# Patient Record
Sex: Female | Born: 1935
Health system: Southern US, Community
[De-identification: ages and names within clinical notes are randomized; demographics above are authoritative.]

## PROBLEM LIST (undated history)

## (undated) DIAGNOSIS — R519 Headache, unspecified: Secondary | ICD-10-CM

## (undated) DIAGNOSIS — Z8614 Personal history of Methicillin resistant Staphylococcus aureus infection: Secondary | ICD-10-CM

## (undated) DIAGNOSIS — I5189 Other ill-defined heart diseases: Secondary | ICD-10-CM

## (undated) DIAGNOSIS — R011 Cardiac murmur, unspecified: Secondary | ICD-10-CM

## (undated) DIAGNOSIS — N289 Disorder of kidney and ureter, unspecified: Secondary | ICD-10-CM

## (undated) DIAGNOSIS — R0602 Shortness of breath: Secondary | ICD-10-CM

## (undated) DIAGNOSIS — I10 Essential (primary) hypertension: Secondary | ICD-10-CM

## (undated) DIAGNOSIS — N189 Chronic kidney disease, unspecified: Secondary | ICD-10-CM

## (undated) DIAGNOSIS — I509 Heart failure, unspecified: Secondary | ICD-10-CM

## (undated) DIAGNOSIS — R51 Headache: Secondary | ICD-10-CM

## (undated) DIAGNOSIS — R42 Dizziness and giddiness: Secondary | ICD-10-CM

## (undated) DIAGNOSIS — M1990 Unspecified osteoarthritis, unspecified site: Secondary | ICD-10-CM

## (undated) HISTORY — DX: Essential (primary) hypertension: I10

## (undated) HISTORY — DX: Shortness of breath: R06.02

## (undated) HISTORY — PX: JOINT REPLACEMENT: SHX530

## (undated) HISTORY — DX: Cardiac murmur, unspecified: R01.1

## (undated) HISTORY — DX: Unspecified osteoarthritis, unspecified site: M19.90

## (undated) HISTORY — DX: Personal history of Methicillin resistant Staphylococcus aureus infection: Z86.14

## (undated) HISTORY — PX: FRACTURE SURGERY: SHX138

## (undated) HISTORY — DX: Chronic kidney disease, unspecified: N18.9

## (undated) HISTORY — DX: Headache, unspecified: R51.9

## (undated) HISTORY — DX: Dizziness and giddiness: R42

## (undated) HISTORY — DX: Headache: R51

## (undated) HISTORY — PX: CAROTID ENDARTERECTOMY: SUR193

## (undated) HISTORY — PX: OTHER SURGICAL HISTORY: SHX169

---
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
## 2000-10-10 HISTORY — PX: OTHER SURGICAL HISTORY: SHX169

## 2001-03-27 ENCOUNTER — Encounter: Payer: Self-pay | Admitting: *Deleted

## 2001-03-29 ENCOUNTER — Inpatient Hospital Stay (HOSPITAL_COMMUNITY): Admission: RE | Admit: 2001-03-29 | Discharge: 2001-04-02 | Payer: Self-pay | Admitting: *Deleted

## 2001-04-02 ENCOUNTER — Inpatient Hospital Stay (HOSPITAL_COMMUNITY)
Admission: RE | Admit: 2001-04-02 | Discharge: 2001-04-07 | Payer: Self-pay | Admitting: Physical Medicine & Rehabilitation

## 2001-12-12 ENCOUNTER — Other Ambulatory Visit: Admission: RE | Admit: 2001-12-12 | Discharge: 2001-12-12 | Payer: Self-pay | Admitting: Family Medicine

## 2003-07-14 DIAGNOSIS — M48061 Spinal stenosis, lumbar region without neurogenic claudication: Secondary | ICD-10-CM | POA: Insufficient documentation

## 2003-10-23 ENCOUNTER — Encounter: Admission: RE | Admit: 2003-10-23 | Discharge: 2003-10-23 | Payer: Self-pay | Admitting: Orthopedic Surgery

## 2003-11-06 ENCOUNTER — Encounter: Admission: RE | Admit: 2003-11-06 | Discharge: 2003-11-06 | Payer: Self-pay | Admitting: Orthopedic Surgery

## 2003-12-11 ENCOUNTER — Encounter: Admission: RE | Admit: 2003-12-11 | Discharge: 2003-12-11 | Payer: Self-pay | Admitting: Orthopedic Surgery

## 2005-07-29 ENCOUNTER — Encounter: Admission: RE | Admit: 2005-07-29 | Discharge: 2005-07-29 | Payer: Self-pay | Admitting: Orthopedic Surgery

## 2005-08-11 ENCOUNTER — Encounter: Admission: RE | Admit: 2005-08-11 | Discharge: 2005-08-11 | Payer: Self-pay | Admitting: Orthopedic Surgery

## 2008-09-17 ENCOUNTER — Encounter: Admission: RE | Admit: 2008-09-17 | Discharge: 2008-09-17 | Payer: Self-pay | Admitting: Orthopedic Surgery

## 2009-01-29 DIAGNOSIS — E049 Nontoxic goiter, unspecified: Secondary | ICD-10-CM | POA: Insufficient documentation

## 2009-02-05 DIAGNOSIS — B079 Viral wart, unspecified: Secondary | ICD-10-CM | POA: Insufficient documentation

## 2009-03-10 ENCOUNTER — Encounter: Payer: Self-pay | Admitting: Cardiovascular Disease

## 2009-03-10 ENCOUNTER — Ambulatory Visit: Payer: Self-pay | Admitting: Cardiovascular Disease

## 2009-03-10 ENCOUNTER — Ambulatory Visit: Payer: Self-pay

## 2009-03-10 DIAGNOSIS — R079 Chest pain, unspecified: Secondary | ICD-10-CM

## 2009-03-10 DIAGNOSIS — R011 Cardiac murmur, unspecified: Secondary | ICD-10-CM | POA: Insufficient documentation

## 2009-03-17 ENCOUNTER — Telehealth (INDEPENDENT_AMBULATORY_CARE_PROVIDER_SITE_OTHER): Payer: Self-pay | Admitting: *Deleted

## 2009-03-17 ENCOUNTER — Inpatient Hospital Stay (HOSPITAL_COMMUNITY): Admission: RE | Admit: 2009-03-17 | Discharge: 2009-03-23 | Payer: Self-pay | Admitting: Orthopedic Surgery

## 2009-06-15 ENCOUNTER — Emergency Department: Payer: Self-pay | Admitting: Unknown Physician Specialty

## 2009-06-19 ENCOUNTER — Emergency Department: Payer: Self-pay | Admitting: Emergency Medicine

## 2009-06-21 ENCOUNTER — Emergency Department: Payer: Self-pay | Admitting: Emergency Medicine

## 2011-01-17 LAB — BASIC METABOLIC PANEL
BUN: 14 mg/dL (ref 6–23)
CO2: 26 mEq/L (ref 19–32)
Calcium: 8.1 mg/dL — ABNORMAL LOW (ref 8.4–10.5)
Chloride: 102 mEq/L (ref 96–112)
Chloride: 104 mEq/L (ref 96–112)
Creatinine, Ser: 1.15 mg/dL (ref 0.4–1.2)
Creatinine, Ser: 1.18 mg/dL (ref 0.4–1.2)
GFR calc non Af Amer: 47 mL/min — ABNORMAL LOW (ref >60)
Glucose, Bld: 105 mg/dL — ABNORMAL HIGH (ref 70–99)
Glucose, Bld: 111 mg/dL — ABNORMAL HIGH (ref 70–99)
Potassium: 4.2 mEq/L (ref 3.5–5.1)

## 2011-01-17 LAB — CBC
HCT: 30.9 % — ABNORMAL LOW (ref 36.0–46.0)
HCT: 39.3 % (ref 36.0–46.0)
Hemoglobin: 13.5 g/dL (ref 12.0–15.0)
MCHC: 34.4 g/dL (ref 30.0–36.0)
MCHC: 34.4 g/dL (ref 30.0–36.0)
MCHC: 34.9 g/dL (ref 30.0–36.0)
MCV: 95.3 fL (ref 78.0–100.0)
MCV: 95.9 fL (ref 78.0–100.0)
MCV: 96.1 fL (ref 78.0–100.0)
Platelets: 162 10*3/uL (ref 150–400)
Platelets: 207 10*3/uL (ref 150–400)
RBC: 2.84 MIL/uL — ABNORMAL LOW (ref 3.87–5.11)
RDW: 12.9 % (ref 11.5–15.5)
RDW: 13.3 % (ref 11.5–15.5)
RDW: 13.3 % (ref 11.5–15.5)
WBC: 12 10*3/uL — ABNORMAL HIGH (ref 4.0–10.5)

## 2011-01-17 LAB — URINALYSIS, ROUTINE W REFLEX MICROSCOPIC
Ketones, ur: NEGATIVE mg/dL
Nitrite: NEGATIVE
Protein, ur: NEGATIVE mg/dL
Urobilinogen, UA: 0.2 mg/dL (ref 0.0–1.0)
pH: 7 (ref 5.0–8.0)

## 2011-01-17 LAB — PROTIME-INR
INR: 0.9 (ref 0.00–1.49)
INR: 1 (ref 0.00–1.49)
INR: 1.4 (ref 0.00–1.49)
INR: 1.9 — ABNORMAL HIGH (ref 0.00–1.49)
Prothrombin Time: 12.4 seconds (ref 11.6–15.2)
Prothrombin Time: 13.2 seconds (ref 11.6–15.2)
Prothrombin Time: 15.1 seconds (ref 11.6–15.2)
Prothrombin Time: 17.2 seconds — ABNORMAL HIGH (ref 11.6–15.2)

## 2011-01-17 LAB — TYPE AND SCREEN
ABO/RH(D): A POS
Antibody Screen: NEGATIVE

## 2011-01-17 LAB — DIFFERENTIAL
Basophils Relative: 1 % (ref 0–1)
Monocytes Relative: 6 % (ref 3–12)
Neutro Abs: 5.4 10*3/uL (ref 1.7–7.7)
Neutrophils Relative %: 69 % (ref 43–77)

## 2011-01-17 LAB — COMPREHENSIVE METABOLIC PANEL
Alkaline Phosphatase: 61 U/L (ref 39–117)
BUN: 31 mg/dL — ABNORMAL HIGH (ref 6–23)
CO2: 27 mEq/L (ref 19–32)
Calcium: 9.7 mg/dL (ref 8.4–10.5)
GFR calc non Af Amer: 41 mL/min — ABNORMAL LOW (ref >60)
Glucose, Bld: 95 mg/dL (ref 70–99)
Potassium: 4.5 mEq/L (ref 3.5–5.1)
Total Protein: 7.7 g/dL (ref 6.0–8.3)

## 2011-01-17 LAB — URINE CULTURE: Colony Count: 30000

## 2011-01-17 LAB — URINE MICROSCOPIC-ADD ON
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
## 2011-02-22 NOTE — Op Note (Signed)
NAMEHOLLY, IANNACCONE NO.:  [id number]   MEDICAL RECORD NO.:  [id number]          PATIENT TYPE:  INP   LOCATION:  5038                         FACILITY:  [facility]   PHYSICIAN:  Anderson Malta, M.D.    DATE OF BIRTH:  September 15, 1936   DATE OF PROCEDURE:  03/17/2009  DATE OF DISCHARGE:                               OPERATIVE REPORT   PREOPERATIVE DIAGNOSIS:  Right knee arthritis.   POSTOPERATIVE DIAGNOSIS:  Right knee arthritis.   PROCEDURE:  Right total knee replacement.   SURGEON:  Anderson Malta, MD   ASSISTANT:  Epimenio Foot, PA   ANESTHESIA:  General endotracheal.   ESTIMATED BLOOD LOSS:  100 mL.   DRAINS:  Hemovac x1.   INDICATIONS:  Carolyn Hendricks is a 8-year female with right knee  arthritis who presents now for operative management of her arthritis  following failure of conservative management after explanation of risks  and benefits of total knee replacement.   COMPONENTS INSERTED:  DePuy rotating platform 4 narrow femur, 3 tibia,  15 poly, 35 patella.   TOURNIQUET TIME:  115 minutes at 300 mmHg.   PROCEDURE IN DETAIL:  The patient was brought to the operating room  where general endotracheal anesthesia was induced.  Preoperative  antibiotics were administered.  A time-out was called.  Right leg was  then prepped with DuraPrep solution including the foot and draped in a  sterile manner.  This was done after pre-scrubbing with chlorhexidine,  alcohol, and Betadine which allowed to air dry.  The leg was elevated  and exsanguinated with an Esmarch wrap.  Tourniquet was inflated.  An  anterior approach to the knee was utilized.  Skin and subcutaneous  tissues were sharply divided.  Median parapatellar arthrotomy was made.  Precise location was marked #1 Vicryl suture.  Patella was everted.  ACL  and PCL were released.  Lateral patellofemoral ligament was released.  The tissue from the anterior distal femur was removed without disturbing  the  periosteum on the femur.  The fat pad was partially excised.  Significant wear was seen in the medial compartment consistent with  preoperative radiographs.  Two bicortical pins were placed in the medial  and distal femur, 2 pins were placed in the proximal and medial tibia.  Registration points were achieved with the hip center rotation  bimalleolar axis and various points around the knee.  Tibial cut was  then made with collateral and posterior structures protected  perpendicular to mechanical axis in accordance with preoperative  templating and computer guidance.  Next, a tensioner device was placed  in both extension and flexion.  At this time, it appeared that a 12.5 or  15 spacer would give a very good stability in full extension.  Following  this, rotational cuts were made with the rotational guide.  Chamfer cuts  and posterior and anterior cuts were then performed.  A box cut was then  performed.  Tibia was then keel punched.  Trial components were placed.  Patella was cut freehand and a trial button was placed with 25-mm  patella going  to 24 mm of resected patella plus button.  The patient had  a good stability to varus-valgus stress at 0, 30, and 90 degrees,  excellent patella tracking with no-thumbs technique.  Trial components  were removed.  Cut bony surfaces were thoroughly irrigated.  Components  were inserted.  Excess cement was removed.  Same stability and  parameters were maintained.  Tourniquet was released.  Bleeding points  encountered were controlled with electrocautery.  Hemovac drain was  placed.  Parapatellar arthrotomy was closed over a bolster using #1  Vicryl suture followed by interrupted inverted 2-0 Vicryl suture and  skin staples.  The small incisions for the pins were irrigated and  closed using 3-0 nylon.  A bulky dressing was applied.  Solution of  Marcaine, morphine, and clonidine injected to the knee for postop pain  relief.  She tolerated the procedure  well without any immediate  complications.  Freda Munro Vernon's assistance was a medical necessity for  retraction of important neurovascular structures.  She was required all  times during the case.      Anderson Malta, M.D.  Electronically Signed     GSD/MEDQ  D:  03/17/2009  T:  03/18/2009  Job:  [id number]

## 2011-02-25 NOTE — Discharge Summary (Signed)
[place]  Patient:    Carolyn Hendricks, Carolyn Hendricks                      MRN: [id number] Adm. Date:  [id number] Disc. Date: [id number] Attending:  Alger Simons T Dictator:   Lavon Paganini. [place], P.A.                           Discharge Summary  ADDENDUM   The patient was to be discharged on April 06, 2001, but discharge was held until June 29, for monitoring of patient of which went very well.  There was no change in her hospital course other than discharge date.  She had received ongoing therapies as advised and Arville Go will continue to follow her Coumadin as advised with next prothrombin time for April 09, 2001. DD:  04/09/01 TD:  04/09/01 Job: [id number]

## 2011-02-25 NOTE — H&P (Signed)
[place]  Patient:    Carolyn Hendricks, Carolyn Hendricks                        MRN: [id number] Adm. Date:  03/29/01 Attending:  Hedda Slade, M.D.                         History and Physical  HISTORY OF PRESENT ILLNESS:  Ms. Gunderson is a 75 year old woman with painful osteoarthritis of the knees admitted for left total knee arthroplasty.  The knees are painful with all activities, at times have caused her to fall or give way.  She feels like she is experiencing bone-on-bone pain.  This interferes also with her sleep.  Surgery and the possible complications were fully discussed with her.  No guarantees were offered, and she elected to proceed on with total knee arthroplasty.  ALLERGIES:  No medical allergies known.  MEDICATIONS:  Norvasc 5 mg one q.d., and Atacand/hydrochlorothiazide 16/12.5 mg one q.d.  MEDICAL DOCTOR:  Loura Pardon, M.D.  PAST SURGICAL HISTORY:  She has had facial surgery.  REVIEW OF SYSTEMS:  She is not current with Pap smear or mammogram.  She smokes one pack of cigarettes per day.  FAMILY HISTORY:  Positive with ASCVD and high blood pressure.  PHYSICAL EXAMINATION:  VITAL SIGNS:  She is 5 feet 5 inches, 178 pounds.  Blood pressure 146/84, pulse is 88, temperature 98.3, respirations 16.  HEENT:  She wears glasses.  Extraocular movements full.  Her tympanic membranes have a normal light reflex.  Pharynx is clear.  There is an upper denture.  CHEST:  She has fair volume, a little rhonchi which cleared with cough.  CARDIAC:  Heart has a regular rhythm, no murmurs heard.  ABDOMEN:  Soft, nontender, no palpable masses.  Bowel sounds are normal.  ORTHOPEDIC:  She has an antalgic gait and varus knees.  She has good femoral pulse without a bruit.  Dorsalis pedis pulse is good.  There is no leg edema. The left knee motion with goniometer is 25-120 degrees.  There is palpable crepitation, slight effusion, no palpable osteophytes, with a  small amount of varus-valgus wiggling to test.  DIAGNOSTIC STUDIES:  X-rays:  There is no medial joint space.  There is tricompartment osteoarthritis.  ADMITTING DIAGNOSES: 1. Tricompartment osteoarthritis, left knee, with varus deformity. 2. High blood pressure. 3. Chronic lung disease with smoking.  PLAN:  Left total knee arthroplasty as fully discussed. DD:  03/27/01 TD:  03/27/01 Job: [id number]

## 2011-02-25 NOTE — Op Note (Signed)
[place]. [facility]  Patient:    Carolyn Hendricks, Carolyn Hendricks                      MRN: [id number] Proc. Date: 03/29/01 Adm. Date:  [id number] Attending:  Marlon Pel                           Operative Report  PREOPERATIVE DIAGNOSIS:  Tricompartment osteoarthritis, left knee.  POSTOPERATIVE DIAGNOSIS:  Tricompartment osteoarthritis, left knee.  OPERATIVE PROCEDURE:  Left knee total knee arthroplasty utilizing [employer] PS [place]orp[place] components.  ANESTHESIA:  General by endotracheal tube.  SURGEON:  Hedda Slade, M.D.  ASSISTANT:  Melina Copa. Maryjean Ka, M.D.  DESCRIPTION OF PROCEDURE:  Patient had an IV started and was given Ancef 2 g IV.  She was given a general anesthetic by endotracheal tube.  A Foley catheter was put in place.  A bump was placed under the left hip.  A tourniquet was applied, proximal left thigh.  It was then isolated with a U-drape.  She was then prepped from the toes to along the edges of this drape, then draped in the usual manner for sterility; this included the use of two Iobans.  The extremity was exsanguinated by use of an Esmarch, the proximal pneumatic tourniquet elevated to 350 mmHg.  A straight oblique incision was made from above the patella, passing distally about mid-patella, then exiting along the medial side of the patellar tendon.  Subcutaneous tissues were dissected back. The knee was opened through the medial retinaculum.  The quadriceps tendon was opened along its edge and the incision then extended distally along the medial side of the patellar tendon; this then allowed the patella to be everted.  There was a large amount of purplish synovitis and this was largely resected.  The cruciates were resected.  The medial and lateral sides of the tibial plateau were dissected and exposed.  The distal femur was resected in 5 degrees of valgus, 10 mm, using the intramedullary guide.  The size of the distal femur was determined  using the sizer, and that was #7. We used a #7 guide and 3 degrees of external rotation to guide and orient the various chamfer cuts at the end of the femur.  We then used the standard guides to form the distal femur PS box.  On the tibial side, the tibial plateau was resected 12 mm from the highest or lateral side with 5 degrees of posterior tilt using intramedullary guide.  At this point, we used trials, #7 femoral component, #7 tibial tray, and 10-mm thick #7 tibial bearing insert; this allowed full flexion and extension of the knee after a little added release medially.  The popliteus remained intact and did not seem tight.  Attention was then directed back to the tibial plateau and the tower was used with guide and predetermined rotation to impact for the fin of the tibial tray.  Then we measured the patella and resected about 10 mm and sized for #7 universal dome patella.  The peg holes were made and the trial dome patella fit well and with flexion and extension, it road on the femoral trochlea very well with no sensation of lateral subluxation or tilting.  At this point then, the knee would go into full extension, would fully flex and had just a wiggle of laxity in extension and in flexion.  We then copiously irrigated the entire area with  pulsatile lavage and then cemented the tibial tray, femoral component and tibial button with Simplex.  We lavaged the bone surfaces, dried them, applied cement and applied a layer of cement to the implants, then each were installed, compressed and held there until the cement hardened; in the meantime, all excess cement was removed.  At this point, we let the tourniquet down, again copiously irrigated and made a real effort to find and remove any bits of debris or cement, we irrigated again and then closed.  The retinaculum was approximated with figure-of-eight sutures of #1 Vicryl, more superficial tissues approximated with 2-0  Vicryl, skin edges approximated with metal staples.  One suction Hemovac was placed superficial to the retinaculum and brought out through a superolateral puncture wounds.  Subcutaneous Marcaine was instilled, Xeroform applied on the skin followed by 4 x 4s, an ABD, Kerlix and an Ace wrap.  A knee immobilizer was applied and the patient then returned to the recovery room in good condition.  Estimated blood loss was approximately 300, none was replaced.  There were no apparent cardiovascular problems throughout the case.  DD:  03/29/01 TD:  03/29/01 Job: [id number]

## 2011-02-25 NOTE — Discharge Summary (Signed)
[place]. [facility]  Patient:    STELLAROSE, CERNY                      MRN: [id number] Adm. Date:  [id number] Disc. Date: 04/06/01 Attending:  Alger Simons T Dictator:   Lavon Paganini. [place], P.A. CC:         Pixie Casino, M.D.  [name], M.D., [place], [place].   Discharge Summary  DISCHARGE DIAGNOSES: 1. Left total knee arthroplasty March 29, 2001. 2. Postoperative anemia. 3. Hypertension. 4. Tobacco abuse.  HISTORY OF PRESENT ILLNESS:  A 75 year old white female admitted March 29, 2001, with progressive left knee pain and no relief with conservative care. X-rays with advanced degenerative joint disease.  Underwent a left total knee arthroplasty March 29, 2001, per Dr. Jenean Lindau.  Placed on Coumadin for deep vein thrombosis prophylaxis and partial weightbearing.  Postoperative pain management.  Hospital course uneventful.  Minimal assistance for ambulation and transfers.  No chest pain or shortness of breath.  Latest hemoglobin of 9.0, INR 1.1, chemistries unremarkable.  Admitted for comprehensive rehabilitation program.  PAST MEDICAL HISTORY:  Hypertension.  PAST SURGICAL HISTORY:  Facial plastic surgery, ankle fracture in 1984, and appendectomy.  ALLERGIES:  No known drug allergies.  TOBACCO:  Positive smoker.  PRIMARY PHYSICIAN:  Dr. [name] of [place], [place].  MEDICATIONS PRIOR TO ADMISSION: 1. Norvasc 5 mg daily. 2. Atacand / hydrochlorothiazide 16-12.5 mg daily.  SOCIAL HISTORY:  Lives alone in [place], [place].  Independent prior to admission and retired.  One level home with step to entry.  Daughter to provide assistance on discharge.  HOSPITAL COURSE:  The patient did well while on rehabilitation services with therapies initiated on a b.i.d. basis.  The following issues were followed in patients rehabilitation course.  Pertaining to Ms. Clapps left total knee arthroplasty, remains stable. Surgical  site healing nicely.  Staples remained intact.  Followup with Dr. Jenean Lindau on Monday, April 09, 2001, for removal of clips.  She was ambulating extended distances with a walker, partial weightbearing.  She was needing supervision for activities of daily living.  Arrangements were made for home health physical occupational therapies as well as a nurse.  She continued on Coumadin for deep vein thrombosis prophylaxis.  She was on subcutaneous lovenox until INR was greater 2.0.  This was discontinued on April 05, 2001, as INR lab results of 2.0 were noted.  Postoperative anemia continued to improve with iron supplement from 8.4 to 9.1.  Her trinsicon was changed to Chromagen for better GI tolerability.  Her blood pressures remained controlled with home regimen of Norvasc, atacand and hydrochlorothiazide.  There was no orthostatic changes.  Noted a history of tobacco abuse.  It was discussed, the need for cessation of smoking.   It was questionable if she will be compliant with these requests.  She was voiding without difficulty but noted some nighttime frequency.  A urinalysis study was negative.  There was some suggestion of the possibility of trials of Ditropan.  This will be followed up with her primary M.D.  LATEST LABORATORY DATA:  Showed a hemoglobin of 9.1, hematocrit 26.4, INR 2.0.  Sodium 141, potassium 3.6, BUN 17, creatinine 0.8.  DISCHARGE MEDICATIONS: 1. Coumadin daily with dose to be established at time of discharge to complete    Coumadin protocol.  Her Coumadin dosage would be added to her discharge    sheet provided for patient. 2. Norvasc 5 mg daily. 3.  Atacand - hydrochlorothiazide daily as prior to hospital admission. 4. Chromagen iron supplement 1 tablet twice daily. 5. Percocet as needed for pain. 6. Tylenol as needed.  ACTIVITY:  Partial weightbearing with walker.  DIET:  Regular.  WOUND CARE:  Follow up with Dr. Jenean Lindau on Monday, April 09, 2001, for removal of staples.   Call if any increased redness, drainage or fever.  Home health nurse for prothrombin time to be followed by home health agency versus her primary M.D. depending local support of home health agency.  Dr. [name] of [place] will notified as needed.  Home health physical and occupational therapy also arranged. DD:  04/05/01 TD:  04/05/01 Job: [id number]

## 2011-02-25 NOTE — Discharge Summary (Signed)
NAMEDELAINA, FETSCH NO.:  [id number]   MEDICAL RECORD NO.:  [id number]          PATIENT TYPE:  INP   LOCATION:  5038                         FACILITY:  [facility]   PHYSICIAN:  Anderson Malta, M.D.    DATE OF BIRTH:  12-Mar-1936   DATE OF ADMISSION:  03/17/2009  DATE OF DISCHARGE:  03/23/2009                               DISCHARGE SUMMARY   ADMISSION DIAGNOSES:  1. Right knee osteoarthritis.  2. Hypertension.  3. Sleep apnea.  4. Osteoporosis.  5. Status post left total knee replacement in 2002.  6. Narcotic-dependent fibromyalgia.  7. Tobacco abuse.   DISCHARGE DIAGNOSES:  1. Right knee osteoarthritis.  2. Hypertension.  3. Sleep apnea.  4. Osteoporosis.  5. Status post left total knee replacement in 2002.  6. Narcotic-dependent fibromyalgia.  7. Tobacco abuse.  8. Posthemorrhagic anemia.   PROCEDURE:  On March 17, 2009, the patient underwent right total knee  replacement performed by Dr. Marlou Sa, assisted by Phillips Hay, PA-C,  under general anesthesia.   CONSULTATIONS:  None.   BRIEF HISTORY:  Ms. Carolyn Hendricks is a 75 year old female with right knee  arthritis, which has failed conservative management.  She wished to  proceed with surgical intervention in the form of a right total knee  arthroplasty and was admitted for this procedure.   BRIEF HOSPITAL COURSE:  The patient tolerated the procedure under  general anesthesia without complications.  Postoperatively,  neurovascular and motor function of the lower extremities was noted to  be intact.  She was started on physical therapy for ambulation and gait  training, range of motion, stretching, and strengthening exercises.  The  patient was also seen by the occupational therapist for ADLs.  She  improved throughout the hospital stay to an independent level with ADLs.  She was ambulating 250 feet prior to discharge.  CPM was utilized for  passive range of motion and she was also taught active range of motion.  Active range of motion was 275 degrees prior to discharge.  Dressing  changes were done daily and the patient's wound was healing without  signs of infection.  She was on Coumadin for DVT prophylaxis with  adjustments in Coumadin dose made according to daily Protimes by the  pharmacist at [facility].  The patient was able to void without difficulty  following removal of Foley catheter.   She was taking a regular diet.   Hemoglobin and hematocrit on admission 32.5 and 39.3 respectively.  Discharge hemoglobin 9.4 and hematocrit 27.1.  INR at discharge 1.9.  Chemistry studies within normal limits on admission with exception of  BUN 31 and creatinine 129, which normalized after surgery.  Otherwise,  values within normal limits on chemistries.  Calcium lowest at 8.1.  Urinalysis negative for urinary tract infection on March 17, 2009, and  urine culture with final results on March 18, 2009, showing 30,000  colonies of multiple bacterial morphotypes.   PLAN:  The patient was discharged to her home.  Arrangements were made  for home health physical therapy.  She will continue with active range  of motion exercises.  Also continue with strengthening and stretching  exercises.  Ambulation weightbearing as tolerated utilizing a walker.  Dressing change to be done daily or as needed.  She will follow up with  Dr. Marlou Sa in 2 weeks from the date of surgery.   MEDICATIONS AT DISCHARGE:  1. Percocet 10/325 one every 3-4 hours as needed for pain.  2. Robaxin 500 mg 1 every 8 hours as needed for spasm.  3. Coumadin 5 mg daily for 4 weeks.   She will resume home medications as taken prior to admission with the  exception of Darvocet-N 100 and Advil.  She was given medication  reconciliation form with these instructions.   She was advised to call the office should she have questions or concerns  prior to return office visit.  All questions encouraged and answered.      Epimenio Foot, P.A.      Anderson Malta, M.D.  Electronically Signed    SMV/MEDQ  D:  04/23/2009  T:  04/23/2009  Job:  [id number]

## 2011-02-25 NOTE — Discharge Summary (Signed)
. [facility]  Patient:    Carolyn Hendricks, Carolyn Hendricks                      MRN: [id number] Adm. Date:  [id number] Disc. Date: [id number] Attending:  Alger Simons T                           Discharge Summary  ADMISSION DIAGNOSES: 1. Tricompartment osteoarthritis, left knee with varus deformity. 2. Hypertension. 3. Chronic lung disease.  DISCHARGE DIAGNOSES: 1. Tricompartment osteoarthritis, left knee with varus deformity. 2. Hypertension. 3. Chronic lung disease.  PROCEDURE:  March 29, 2001, left total knee arthroplasty with cementing of components.  HISTORY OF PRESENT ILLNESS:  See that dictated on admission.  HOSPITAL COURSE:  On the day of admission she was taken to the operating room where the left total knee arthroplasty was performed as detailed in the operative notes.  On the preoperative and postoperative the patient was prophylactic IV antibiotics.  Immediately postoperative she was begun on Coumadin and foot pumps and bed activity.  On the first day postoperative her dressing was changed, her wound appeared fine.  Suction drain was removed. She was begun on physical therapy, partial weightbearing.  By June 22, her hemoglobin was 9.0.  By June 23, it was 8.1 and it was elected at that time to continue with iron treatment p.o.  On June 24, it was felt that she would be a good inpatient rehabilitation candidate and therefore she was discharged to rehabilitation.  It was felt that some of her blood loss was from a subcutaneous skin bleeder and this was started with her first dressing change and was brought under control with pressure dressings.  LABORATORY DATA:  Electrolytes normal, BUN 31, and routine chemistries and liver enzymes normal.  Urinalysis normal.  Hemoglobin on admission was 15 and on June 21, it was 10.3.  This was felt partially due to hemodilution.  EKG was normal sinus rhythm interpreted as a normal EKG. DD:  04/20/01 TD:   04/20/01 Job: [id number]

## 2011-03-08 ENCOUNTER — Other Ambulatory Visit: Payer: Self-pay | Admitting: Neurosurgery

## 2011-03-08 DIAGNOSIS — M479 Spondylosis, unspecified: Secondary | ICD-10-CM

## 2011-03-08 DIAGNOSIS — IMO0002 Reserved for concepts with insufficient information to code with codable children: Secondary | ICD-10-CM

## 2011-03-08 DIAGNOSIS — M419 Scoliosis, unspecified: Secondary | ICD-10-CM

## 2011-03-25 ENCOUNTER — Encounter: Payer: Self-pay | Admitting: Cardiovascular Disease

## 2011-03-25 ENCOUNTER — Other Ambulatory Visit: Payer: Self-pay

## 2012-03-03 ENCOUNTER — Inpatient Hospital Stay: Payer: Self-pay | Admitting: Internal Medicine

## 2012-03-03 DIAGNOSIS — R0602 Shortness of breath: Secondary | ICD-10-CM | POA: Diagnosis not present

## 2012-03-03 DIAGNOSIS — R05 Cough: Secondary | ICD-10-CM | POA: Diagnosis not present

## 2012-03-03 DIAGNOSIS — I6529 Occlusion and stenosis of unspecified carotid artery: Secondary | ICD-10-CM | POA: Diagnosis not present

## 2012-03-03 DIAGNOSIS — A419 Sepsis, unspecified organism: Secondary | ICD-10-CM | POA: Diagnosis not present

## 2012-03-03 DIAGNOSIS — F17200 Nicotine dependence, unspecified, uncomplicated: Secondary | ICD-10-CM | POA: Diagnosis present

## 2012-03-03 DIAGNOSIS — Z8614 Personal history of Methicillin resistant Staphylococcus aureus infection: Secondary | ICD-10-CM | POA: Diagnosis not present

## 2012-03-03 DIAGNOSIS — Z96659 Presence of unspecified artificial knee joint: Secondary | ICD-10-CM | POA: Diagnosis not present

## 2012-03-03 DIAGNOSIS — R0989 Other specified symptoms and signs involving the circulatory and respiratory systems: Secondary | ICD-10-CM | POA: Diagnosis not present

## 2012-03-03 DIAGNOSIS — L0231 Cutaneous abscess of buttock: Secondary | ICD-10-CM | POA: Diagnosis not present

## 2012-03-03 DIAGNOSIS — E860 Dehydration: Secondary | ICD-10-CM | POA: Diagnosis not present

## 2012-03-03 DIAGNOSIS — Z7982 Long term (current) use of aspirin: Secondary | ICD-10-CM | POA: Diagnosis not present

## 2012-03-03 DIAGNOSIS — D72829 Elevated white blood cell count, unspecified: Secondary | ICD-10-CM | POA: Diagnosis not present

## 2012-03-03 DIAGNOSIS — R000 Tachycardia, unspecified: Secondary | ICD-10-CM | POA: Diagnosis not present

## 2012-03-03 DIAGNOSIS — K612 Anorectal abscess: Secondary | ICD-10-CM | POA: Diagnosis not present

## 2012-03-03 DIAGNOSIS — R6510 Systemic inflammatory response syndrome (SIRS) of non-infectious origin without acute organ dysfunction: Secondary | ICD-10-CM | POA: Diagnosis not present

## 2012-03-03 DIAGNOSIS — I471 Supraventricular tachycardia: Secondary | ICD-10-CM | POA: Diagnosis present

## 2012-03-03 DIAGNOSIS — I10 Essential (primary) hypertension: Secondary | ICD-10-CM | POA: Diagnosis present

## 2012-03-03 DIAGNOSIS — L03317 Cellulitis of buttock: Secondary | ICD-10-CM | POA: Diagnosis not present

## 2012-03-03 DIAGNOSIS — R509 Fever, unspecified: Secondary | ICD-10-CM | POA: Diagnosis not present

## 2012-03-03 DIAGNOSIS — M1990 Unspecified osteoarthritis, unspecified site: Secondary | ICD-10-CM | POA: Diagnosis present

## 2012-03-03 DIAGNOSIS — I658 Occlusion and stenosis of other precerebral arteries: Secondary | ICD-10-CM | POA: Diagnosis not present

## 2012-03-03 DIAGNOSIS — R0789 Other chest pain: Secondary | ICD-10-CM | POA: Diagnosis not present

## 2012-03-03 DIAGNOSIS — N179 Acute kidney failure, unspecified: Secondary | ICD-10-CM | POA: Diagnosis not present

## 2012-03-03 LAB — CBC
HGB: 13.5 g/dL (ref 12.0–16.0)
MCH: 31.3 pg (ref 26.0–34.0)
MCHC: 33 g/dL (ref 32.0–36.0)
RBC: 4.31 10*6/uL (ref 3.80–5.20)
RDW: 14 % (ref 11.5–14.5)

## 2012-03-03 LAB — COMPREHENSIVE METABOLIC PANEL
Albumin: 3.5 g/dL (ref 3.4–5.0)
Alkaline Phosphatase: 86 U/L (ref 50–136)
Anion Gap: 11 (ref 7–16)
Bilirubin,Total: 0.6 mg/dL (ref 0.2–1.0)
Calcium, Total: 9 mg/dL (ref 8.5–10.1)
Chloride: 104 mmol/L (ref 98–107)
Creatinine: 1.39 mg/dL — ABNORMAL HIGH (ref 0.60–1.30)
EGFR (African American): 43 — ABNORMAL LOW
EGFR (Non-African Amer.): 37 — ABNORMAL LOW
Osmolality: 278 (ref 275–301)
SGOT(AST): 33 U/L (ref 15–37)
SGPT (ALT): 18 U/L
Sodium: 137 mmol/L (ref 136–145)

## 2012-03-03 LAB — URINALYSIS, COMPLETE
Bacteria: NONE SEEN
Ketone: NEGATIVE
Leukocyte Esterase: NEGATIVE
Ph: 6 (ref 4.5–8.0)
RBC,UR: 10 /HPF (ref 0–5)
Specific Gravity: 1.015 (ref 1.003–1.030)
WBC UR: 1 /HPF (ref 0–5)

## 2012-03-03 LAB — CK TOTAL AND CKMB (NOT AT ARMC): CK-MB: 0.9 ng/mL (ref 0.5–3.6)

## 2012-03-04 DIAGNOSIS — R000 Tachycardia, unspecified: Secondary | ICD-10-CM

## 2012-03-04 DIAGNOSIS — R0602 Shortness of breath: Secondary | ICD-10-CM

## 2012-03-04 LAB — BASIC METABOLIC PANEL
BUN: 30 mg/dL — ABNORMAL HIGH (ref 7–18)
Chloride: 103 mmol/L (ref 98–107)
Co2: 26 mmol/L (ref 21–32)
EGFR (African American): 38 — ABNORMAL LOW
Glucose: 88 mg/dL (ref 65–99)
Sodium: 139 mmol/L (ref 136–145)

## 2012-03-04 LAB — CBC WITH DIFFERENTIAL/PLATELET
Basophil %: 0.2 %
Eosinophil #: 0.1 10*3/uL (ref 0.0–0.7)
HGB: 12 g/dL (ref 12.0–16.0)
Lymphocyte #: 1.1 10*3/uL (ref 1.0–3.6)
MCH: 31.5 pg (ref 26.0–34.0)
MCV: 94 fL (ref 80–100)
Monocyte %: 6.2 %
Neutrophil #: 15.5 10*3/uL — ABNORMAL HIGH (ref 1.4–6.5)
Neutrophil %: 86.6 %
Platelet: 171 10*3/uL (ref 150–440)
RBC: 3.8 10*6/uL (ref 3.80–5.20)
RDW: 14 % (ref 11.5–14.5)
WBC: 17.9 10*3/uL — ABNORMAL HIGH (ref 3.6–11.0)

## 2012-03-04 LAB — CK-MB
CK-MB: 1.2 ng/mL (ref 0.5–3.6)
CK-MB: 1 ng/mL (ref 0.5–3.6)

## 2012-03-04 LAB — TROPONIN I: Troponin-I: <0.02 ng/mL

## 2012-03-04 LAB — TSH: Thyroid Stimulating Horm: 1.55 u[IU]/mL

## 2012-03-05 LAB — CBC WITH DIFFERENTIAL/PLATELET
HGB: 11.3 g/dL — ABNORMAL LOW (ref 12.0–16.0)
Lymphocyte #: 1.5 10*3/uL (ref 1.0–3.6)
MCH: 31.3 pg (ref 26.0–34.0)
MCV: 93 fL (ref 80–100)
Monocyte #: 0.8 x10 3/mm (ref 0.2–0.9)
Neutrophil #: 12.2 10*3/uL — ABNORMAL HIGH (ref 1.4–6.5)
Platelet: 179 10*3/uL (ref 150–440)
RBC: 3.63 10*6/uL — ABNORMAL LOW (ref 3.80–5.20)
RDW: 13.8 % (ref 11.5–14.5)

## 2012-03-05 LAB — BASIC METABOLIC PANEL
EGFR (African American): 43 — ABNORMAL LOW
EGFR (Non-African Amer.): 37 — ABNORMAL LOW

## 2012-03-06 ENCOUNTER — Encounter: Payer: Self-pay | Admitting: Cardiovascular Disease

## 2012-03-09 DIAGNOSIS — F17200 Nicotine dependence, unspecified, uncomplicated: Secondary | ICD-10-CM | POA: Diagnosis not present

## 2012-03-09 DIAGNOSIS — I6529 Occlusion and stenosis of unspecified carotid artery: Secondary | ICD-10-CM | POA: Diagnosis not present

## 2012-03-09 DIAGNOSIS — I10 Essential (primary) hypertension: Secondary | ICD-10-CM | POA: Diagnosis not present

## 2012-03-14 ENCOUNTER — Encounter: Payer: Self-pay | Admitting: Cardiovascular Disease

## 2012-03-16 DIAGNOSIS — G4700 Insomnia, unspecified: Secondary | ICD-10-CM | POA: Diagnosis not present

## 2012-03-16 DIAGNOSIS — A4902 Methicillin resistant Staphylococcus aureus infection, unspecified site: Secondary | ICD-10-CM | POA: Diagnosis not present

## 2012-03-16 DIAGNOSIS — I779 Disorder of arteries and arterioles, unspecified: Secondary | ICD-10-CM | POA: Diagnosis not present

## 2012-03-19 ENCOUNTER — Ambulatory Visit: Payer: Self-pay | Admitting: Vascular Surgery

## 2012-03-19 DIAGNOSIS — I129 Hypertensive chronic kidney disease with stage 1 through stage 4 chronic kidney disease, or unspecified chronic kidney disease: Secondary | ICD-10-CM | POA: Diagnosis not present

## 2012-03-19 DIAGNOSIS — I499 Cardiac arrhythmia, unspecified: Secondary | ICD-10-CM | POA: Diagnosis not present

## 2012-03-19 DIAGNOSIS — N189 Chronic kidney disease, unspecified: Secondary | ICD-10-CM | POA: Diagnosis not present

## 2012-03-19 DIAGNOSIS — Z96659 Presence of unspecified artificial knee joint: Secondary | ICD-10-CM | POA: Diagnosis not present

## 2012-03-19 DIAGNOSIS — I6529 Occlusion and stenosis of unspecified carotid artery: Secondary | ICD-10-CM | POA: Diagnosis not present

## 2012-03-19 DIAGNOSIS — Z79899 Other long term (current) drug therapy: Secondary | ICD-10-CM | POA: Diagnosis not present

## 2012-03-19 LAB — CREATININE, SERUM: Creatinine: 1.19 mg/dL (ref 0.60–1.30)

## 2012-03-27 DIAGNOSIS — I10 Essential (primary) hypertension: Secondary | ICD-10-CM | POA: Diagnosis not present

## 2012-03-27 DIAGNOSIS — R319 Hematuria, unspecified: Secondary | ICD-10-CM | POA: Diagnosis not present

## 2012-03-27 DIAGNOSIS — N2581 Secondary hyperparathyroidism of renal origin: Secondary | ICD-10-CM | POA: Diagnosis not present

## 2012-03-27 DIAGNOSIS — I779 Disorder of arteries and arterioles, unspecified: Secondary | ICD-10-CM | POA: Diagnosis not present

## 2012-03-28 ENCOUNTER — Ambulatory Visit (INDEPENDENT_AMBULATORY_CARE_PROVIDER_SITE_OTHER): Payer: Medicare Other | Admitting: Cardiovascular Disease

## 2012-03-28 ENCOUNTER — Encounter: Payer: Self-pay | Admitting: Cardiovascular Disease

## 2012-03-28 VITALS — BP 140/63 | HR 67 | Ht 60.0 in | Wt 173.0 lb

## 2012-03-28 DIAGNOSIS — R011 Cardiac murmur, unspecified: Secondary | ICD-10-CM

## 2012-03-28 DIAGNOSIS — E785 Hyperlipidemia, unspecified: Secondary | ICD-10-CM | POA: Insufficient documentation

## 2012-03-28 DIAGNOSIS — I6529 Occlusion and stenosis of unspecified carotid artery: Secondary | ICD-10-CM | POA: Insufficient documentation

## 2012-03-28 DIAGNOSIS — I498 Other specified cardiac arrhythmias: Secondary | ICD-10-CM

## 2012-03-28 DIAGNOSIS — I471 Supraventricular tachycardia: Secondary | ICD-10-CM

## 2012-03-28 DIAGNOSIS — F17200 Nicotine dependence, unspecified, uncomplicated: Secondary | ICD-10-CM | POA: Diagnosis not present

## 2012-03-28 MED ORDER — ATORVASTATIN CALCIUM 20 MG PO TABS: 20.0000 mg | ORAL_TABLET | Freq: Every day | ORAL | Status: DC

## 2012-03-28 NOTE — Assessment & Plan Note (Signed)
We have suggested she start low-dose Lipitor with titration up from 10 mg to 20 mg over the next month with cholesterol check again in several months time. Given her severe carotid arterial disease, she will need aggressive cholesterol management and smoking cessation.

## 2012-03-28 NOTE — Patient Instructions (Addendum)
You are doing well. Please start lipitor 1/2 pill a day for a 2 weeks, then increase to one a day  We will schedule a stress test at [facility] on April 02, 2012 arrive at 8:00 am. No caffeine for 24 hours before the test Hold diltiazem and metoprolol the morning of the test  Please call us if you have new issues that need to be addressed before your next appt.  Your physician wants you to follow-up in: 6 months.  You will receive a reminder letter in the mail two months in advance. If you don't receive a letter, please call our office to schedule the follow-up appointment.

## 2012-03-28 NOTE — Progress Notes (Signed)
Patient ID: Carolyn Hendricks, female    DOB: 1936/02/03, 76 y.o.   MRN: [id number]  HPI Comments: Carolyn Hendricks is a 76 year old woman with history of hypertension, MRSA, long smoking history , arthritis, presented to the emergency room May 26th 2013 with a buttock pain from abscess, also with malaise, nausea vomiting, fever. We will consult him for atrial tachycardia, heart rate of 150 beats per minute. Heart rate resolved with calcium channel blocker. Noted to have left carotid bruit with ultrasound showing 75% plus left carotid stenosis. She presents to establish care in the office.  She reports that she currently feels well and the abscess is healed. She has followup with Dr. Lucky Cowboy for carotid disease. She continues to smoke up to one pack per day for the last 20 years. She has recently seen the renal service and diagnosed with stage III chronic kidney disease.  She denies any further episodes of tachycardia on metoprolol. She has been having episodes of tachycardia for years lasting up to several hours at a time Echocardiogram in the hospital 03/04/2012 showed normal LV systolic function, diastolic dysfunction, otherwise normal study  Lab work from the hospital shows TSH 1.55, creatinine 1.39 BUN 26  EKG from the hospital shows sinus tachycardia rate 144 beats per minute  EKG shows normal sinus rhythm with rate 67 beats per minute with sinus arrhythmia   Outpatient Encounter Prescriptions as of 03/28/2012  Medication Sig Dispense Refill  . aspirin 81 MG tablet Take 81 mg by mouth daily.      . Cyanocobalamin (VITAMIN B-12 PO) Take by mouth daily.      Marland Kitchen diltiazem (CARDIZEM) 120 MG tablet Take 120 mg by mouth daily.      Marland Kitchen HYDROcodone-acetaminophen (VICODIN) 5-500 MG per tablet Take 1 tablet by mouth every 6 (six) hours as needed.      Marland Kitchen levocetirizine (XYZAL) 5 MG tablet Take 5 mg by mouth every evening.      Marland Kitchen LORazepam (ATIVAN) 0.5 MG tablet Take 0.5 mg by mouth every 8 (eight) hours.        . metoprolol tartrate (LOPRESSOR) 25 MG tablet Take 25 mg by mouth 2 (two) times daily.      . Multiple Vitamin (MULTI-VITAMIN DAILY PO) Take by mouth.      . valsartan-hydrochlorothiazide (DIOVAN-HCT) 320-12.5 MG per tablet Take 1/2 tablet by mouth every day      . atorvastatin (LIPITOR) 20 MG tablet Take 1 tablet (20 mg total) by mouth daily.  30 tablet  6   Review of Systems  Constitutional: Negative.   HENT: Negative.   Eyes: Negative.   Respiratory: Negative.   Cardiovascular: Negative.   Gastrointestinal: Negative.   Musculoskeletal: Negative.   Skin: Negative.   Neurological: Negative.   Hematological: Negative.   Psychiatric/Behavioral: Negative.   All other systems reviewed and are negative.    BP 140/63  Pulse 67  Ht 5' (1.524 m)  Wt 173 lb (78.472 kg)  BMI 33.79 kg/m2  Physical Exam  Nursing note and vitals reviewed. Constitutional: She is oriented to person, place, and time. She appears well-developed and well-nourished.  HENT:  Head: Normocephalic.  Nose: Nose normal.  Mouth/Throat: Oropharynx is clear and moist.  Eyes: Conjunctivae are normal. Pupils are equal, round, and reactive to light.  Neck: Normal range of motion. Neck supple. No JVD present. Carotid bruit is present.  Cardiovascular: Normal rate, regular rhythm, S1 normal, S2 normal, normal heart sounds and intact distal pulses.  Exam reveals  no gallop and no friction rub.   No murmur heard. Pulmonary/Chest: Effort normal. No respiratory distress. She has decreased breath sounds. She has no wheezes. She has no rales. She exhibits no tenderness.  Abdominal: Soft. Bowel sounds are normal. She exhibits no distension. There is no tenderness.  Musculoskeletal: Normal range of motion. She exhibits no edema and no tenderness.  Lymphadenopathy:    She has no cervical adenopathy.  Neurological: She is alert and oriented to person, place, and time. Coordination normal.  Skin: Skin is warm and dry. No rash  noted. No erythema.  Psychiatric: She has a normal mood and affect. Her behavior is normal. Judgment and thought content normal.         Assessment and Plan

## 2012-03-28 NOTE — Assessment & Plan Note (Signed)
We have encouraged her to continue to work on weaning her cigarettes and smoking cessation. She will continue to work on this and does not want any assistance with chantix.

## 2012-03-28 NOTE — Assessment & Plan Note (Signed)
Improved symptoms on metoprolol. Stress test has been ordered given she is high risk for coronary artery disease.

## 2012-03-28 NOTE — Assessment & Plan Note (Addendum)
Severe left carotid stenosis, followup with Dr. Lucky Cowboy. Will start aggressive cholesterol management. Recommended smoking cessation.

## 2012-03-29 ENCOUNTER — Other Ambulatory Visit: Payer: Self-pay

## 2012-03-29 DIAGNOSIS — R079 Chest pain, unspecified: Secondary | ICD-10-CM

## 2012-04-02 ENCOUNTER — Ambulatory Visit: Payer: Self-pay | Admitting: Cardiovascular Disease

## 2012-04-02 DIAGNOSIS — R079 Chest pain, unspecified: Secondary | ICD-10-CM | POA: Diagnosis not present

## 2012-04-03 DIAGNOSIS — I6529 Occlusion and stenosis of unspecified carotid artery: Secondary | ICD-10-CM | POA: Diagnosis not present

## 2012-04-03 DIAGNOSIS — I10 Essential (primary) hypertension: Secondary | ICD-10-CM | POA: Diagnosis not present

## 2012-04-04 ENCOUNTER — Telehealth: Payer: Self-pay

## 2012-04-04 NOTE — Telephone Encounter (Signed)
Notified patient per Dr. Roderick Pee looked normal.

## 2012-04-09 ENCOUNTER — Other Ambulatory Visit: Payer: Self-pay | Admitting: Cardiovascular Disease

## 2012-04-09 DIAGNOSIS — R079 Chest pain, unspecified: Secondary | ICD-10-CM

## 2012-04-19 ENCOUNTER — Telehealth: Payer: Self-pay

## 2012-04-19 NOTE — Telephone Encounter (Signed)
Notified patient need to stop atorvastatin and come pick up samplesof crestor 5 mg to take for two weeks then increase to 10 mg one tablet daily. The patient will contact the office if this does not help with her symptoms.

## 2012-04-19 NOTE — Telephone Encounter (Signed)
The atorvastatin is causing joint & muscle pain, dizziness and has difficulty sleeping with swelling in hands. Please advise if you think that the atorvastatin is causing this & what you recommend.

## 2012-04-19 NOTE — Telephone Encounter (Signed)
Would try crestor 5 mg daily for a few weeks, titrating to 10 mg daily We could give samples of 10 mg daily (cut in 1/2 to start)

## 2012-04-24 DIAGNOSIS — R609 Edema, unspecified: Secondary | ICD-10-CM | POA: Diagnosis not present

## 2012-04-24 DIAGNOSIS — I10 Essential (primary) hypertension: Secondary | ICD-10-CM | POA: Diagnosis not present

## 2012-06-06 DIAGNOSIS — E785 Hyperlipidemia, unspecified: Secondary | ICD-10-CM | POA: Diagnosis not present

## 2012-07-10 DIAGNOSIS — I6529 Occlusion and stenosis of unspecified carotid artery: Secondary | ICD-10-CM | POA: Diagnosis not present

## 2012-07-10 DIAGNOSIS — I10 Essential (primary) hypertension: Secondary | ICD-10-CM | POA: Diagnosis not present

## 2012-07-10 DIAGNOSIS — E785 Hyperlipidemia, unspecified: Secondary | ICD-10-CM | POA: Diagnosis not present

## 2012-07-16 DIAGNOSIS — E785 Hyperlipidemia, unspecified: Secondary | ICD-10-CM | POA: Diagnosis not present

## 2012-07-16 DIAGNOSIS — L738 Other specified follicular disorders: Secondary | ICD-10-CM | POA: Diagnosis not present

## 2012-07-16 DIAGNOSIS — M2550 Pain in unspecified joint: Secondary | ICD-10-CM | POA: Diagnosis not present

## 2012-08-16 DIAGNOSIS — E785 Hyperlipidemia, unspecified: Secondary | ICD-10-CM | POA: Diagnosis not present

## 2012-08-16 DIAGNOSIS — I779 Disorder of arteries and arterioles, unspecified: Secondary | ICD-10-CM | POA: Diagnosis not present

## 2012-08-16 DIAGNOSIS — L738 Other specified follicular disorders: Secondary | ICD-10-CM | POA: Diagnosis not present

## 2012-08-16 DIAGNOSIS — Z23 Encounter for immunization: Secondary | ICD-10-CM | POA: Diagnosis not present

## 2012-09-17 DIAGNOSIS — Z0000 Encounter for general adult medical examination without abnormal findings: Secondary | ICD-10-CM | POA: Diagnosis not present

## 2012-09-17 DIAGNOSIS — F411 Generalized anxiety disorder: Secondary | ICD-10-CM | POA: Diagnosis not present

## 2012-09-17 DIAGNOSIS — I10 Essential (primary) hypertension: Secondary | ICD-10-CM | POA: Diagnosis not present

## 2012-09-17 DIAGNOSIS — E785 Hyperlipidemia, unspecified: Secondary | ICD-10-CM | POA: Diagnosis not present

## 2012-10-18 DIAGNOSIS — F17200 Nicotine dependence, unspecified, uncomplicated: Secondary | ICD-10-CM | POA: Diagnosis not present

## 2012-10-18 DIAGNOSIS — I10 Essential (primary) hypertension: Secondary | ICD-10-CM | POA: Diagnosis not present

## 2012-10-18 DIAGNOSIS — N2581 Secondary hyperparathyroidism of renal origin: Secondary | ICD-10-CM | POA: Diagnosis not present

## 2012-10-23 DIAGNOSIS — Z1231 Encounter for screening mammogram for malignant neoplasm of breast: Secondary | ICD-10-CM | POA: Diagnosis not present

## 2012-10-24 DIAGNOSIS — E785 Hyperlipidemia, unspecified: Secondary | ICD-10-CM | POA: Diagnosis not present

## 2012-10-24 DIAGNOSIS — J309 Allergic rhinitis, unspecified: Secondary | ICD-10-CM | POA: Diagnosis not present

## 2012-11-01 DIAGNOSIS — R928 Other abnormal and inconclusive findings on diagnostic imaging of breast: Secondary | ICD-10-CM | POA: Insufficient documentation

## 2012-11-01 DIAGNOSIS — N6489 Other specified disorders of breast: Secondary | ICD-10-CM | POA: Diagnosis not present

## 2012-11-02 DIAGNOSIS — I10 Essential (primary) hypertension: Secondary | ICD-10-CM | POA: Diagnosis not present

## 2012-11-02 DIAGNOSIS — N2581 Secondary hyperparathyroidism of renal origin: Secondary | ICD-10-CM | POA: Diagnosis not present

## 2012-11-02 DIAGNOSIS — R319 Hematuria, unspecified: Secondary | ICD-10-CM | POA: Diagnosis not present

## 2012-11-14 DIAGNOSIS — R928 Other abnormal and inconclusive findings on diagnostic imaging of breast: Secondary | ICD-10-CM | POA: Diagnosis not present

## 2012-12-03 ENCOUNTER — Ambulatory Visit: Payer: Self-pay | Admitting: Surgery

## 2012-12-03 DIAGNOSIS — R920 Mammographic microcalcification found on diagnostic imaging of breast: Secondary | ICD-10-CM | POA: Diagnosis not present

## 2012-12-03 DIAGNOSIS — N63 Unspecified lump in unspecified breast: Secondary | ICD-10-CM | POA: Diagnosis not present

## 2012-12-03 DIAGNOSIS — N6029 Fibroadenosis of unspecified breast: Secondary | ICD-10-CM | POA: Diagnosis not present

## 2012-12-03 DIAGNOSIS — N6019 Diffuse cystic mastopathy of unspecified breast: Secondary | ICD-10-CM | POA: Diagnosis not present

## 2012-12-13 DIAGNOSIS — N2581 Secondary hyperparathyroidism of renal origin: Secondary | ICD-10-CM | POA: Diagnosis not present

## 2012-12-13 DIAGNOSIS — R809 Proteinuria, unspecified: Secondary | ICD-10-CM | POA: Diagnosis not present

## 2012-12-13 DIAGNOSIS — I10 Essential (primary) hypertension: Secondary | ICD-10-CM | POA: Diagnosis not present

## 2012-12-13 DIAGNOSIS — N183 Chronic kidney disease, stage 3 unspecified: Secondary | ICD-10-CM | POA: Diagnosis not present

## 2012-12-20 DIAGNOSIS — R920 Mammographic microcalcification found on diagnostic imaging of breast: Secondary | ICD-10-CM | POA: Diagnosis not present

## 2013-02-11 DIAGNOSIS — F411 Generalized anxiety disorder: Secondary | ICD-10-CM | POA: Diagnosis not present

## 2013-02-11 DIAGNOSIS — E785 Hyperlipidemia, unspecified: Secondary | ICD-10-CM | POA: Diagnosis not present

## 2013-02-11 DIAGNOSIS — I10 Essential (primary) hypertension: Secondary | ICD-10-CM | POA: Diagnosis not present

## 2013-02-11 DIAGNOSIS — R5383 Other fatigue: Secondary | ICD-10-CM | POA: Diagnosis not present

## 2013-02-11 DIAGNOSIS — R5381 Other malaise: Secondary | ICD-10-CM | POA: Diagnosis not present

## 2013-03-19 DIAGNOSIS — R5383 Other fatigue: Secondary | ICD-10-CM | POA: Diagnosis not present

## 2013-03-19 DIAGNOSIS — E785 Hyperlipidemia, unspecified: Secondary | ICD-10-CM | POA: Diagnosis not present

## 2013-03-19 DIAGNOSIS — R5381 Other malaise: Secondary | ICD-10-CM | POA: Diagnosis not present

## 2013-03-19 DIAGNOSIS — F411 Generalized anxiety disorder: Secondary | ICD-10-CM | POA: Diagnosis not present

## 2013-03-19 DIAGNOSIS — M25549 Pain in joints of unspecified hand: Secondary | ICD-10-CM | POA: Diagnosis not present

## 2013-04-23 DIAGNOSIS — M25579 Pain in unspecified ankle and joints of unspecified foot: Secondary | ICD-10-CM | POA: Diagnosis not present

## 2013-04-23 DIAGNOSIS — G5600 Carpal tunnel syndrome, unspecified upper limb: Secondary | ICD-10-CM | POA: Diagnosis not present

## 2013-04-23 DIAGNOSIS — M25519 Pain in unspecified shoulder: Secondary | ICD-10-CM | POA: Diagnosis not present

## 2013-04-23 DIAGNOSIS — M25549 Pain in joints of unspecified hand: Secondary | ICD-10-CM | POA: Diagnosis not present

## 2013-04-24 DIAGNOSIS — IMO0002 Reserved for concepts with insufficient information to code with codable children: Secondary | ICD-10-CM | POA: Diagnosis not present

## 2013-04-24 DIAGNOSIS — M47817 Spondylosis without myelopathy or radiculopathy, lumbosacral region: Secondary | ICD-10-CM | POA: Diagnosis not present

## 2013-04-24 DIAGNOSIS — M48061 Spinal stenosis, lumbar region without neurogenic claudication: Secondary | ICD-10-CM | POA: Diagnosis not present

## 2013-05-09 DIAGNOSIS — F411 Generalized anxiety disorder: Secondary | ICD-10-CM | POA: Diagnosis not present

## 2013-05-09 DIAGNOSIS — R5383 Other fatigue: Secondary | ICD-10-CM | POA: Diagnosis not present

## 2013-05-09 DIAGNOSIS — G4700 Insomnia, unspecified: Secondary | ICD-10-CM | POA: Diagnosis not present

## 2013-05-09 DIAGNOSIS — E785 Hyperlipidemia, unspecified: Secondary | ICD-10-CM | POA: Diagnosis not present

## 2013-05-16 DIAGNOSIS — M19049 Primary osteoarthritis, unspecified hand: Secondary | ICD-10-CM | POA: Diagnosis not present

## 2013-05-16 DIAGNOSIS — M25519 Pain in unspecified shoulder: Secondary | ICD-10-CM | POA: Diagnosis not present

## 2013-06-13 DIAGNOSIS — I10 Essential (primary) hypertension: Secondary | ICD-10-CM | POA: Diagnosis not present

## 2013-06-13 DIAGNOSIS — N183 Chronic kidney disease, stage 3 unspecified: Secondary | ICD-10-CM | POA: Diagnosis not present

## 2013-06-13 DIAGNOSIS — N2581 Secondary hyperparathyroidism of renal origin: Secondary | ICD-10-CM | POA: Diagnosis not present

## 2013-06-13 DIAGNOSIS — L02219 Cutaneous abscess of trunk, unspecified: Secondary | ICD-10-CM | POA: Diagnosis not present

## 2013-06-13 DIAGNOSIS — K651 Peritoneal abscess: Secondary | ICD-10-CM | POA: Diagnosis not present

## 2013-06-13 DIAGNOSIS — D631 Anemia in chronic kidney disease: Secondary | ICD-10-CM | POA: Diagnosis not present

## 2013-08-14 DIAGNOSIS — G4700 Insomnia, unspecified: Secondary | ICD-10-CM | POA: Diagnosis not present

## 2013-08-14 DIAGNOSIS — E785 Hyperlipidemia, unspecified: Secondary | ICD-10-CM | POA: Diagnosis not present

## 2013-08-14 DIAGNOSIS — F411 Generalized anxiety disorder: Secondary | ICD-10-CM | POA: Diagnosis not present

## 2013-08-14 DIAGNOSIS — R5381 Other malaise: Secondary | ICD-10-CM | POA: Diagnosis not present

## 2013-08-14 DIAGNOSIS — Z23 Encounter for immunization: Secondary | ICD-10-CM | POA: Diagnosis not present

## 2013-10-28 DIAGNOSIS — Z8371 Family history of colonic polyps: Secondary | ICD-10-CM | POA: Diagnosis not present

## 2013-10-28 DIAGNOSIS — K625 Hemorrhage of anus and rectum: Secondary | ICD-10-CM | POA: Diagnosis not present

## 2013-10-28 DIAGNOSIS — I2510 Atherosclerotic heart disease of native coronary artery without angina pectoris: Secondary | ICD-10-CM | POA: Diagnosis not present

## 2013-11-12 ENCOUNTER — Ambulatory Visit: Payer: Self-pay | Admitting: Gastroenterology

## 2013-11-12 DIAGNOSIS — M1990 Unspecified osteoarthritis, unspecified site: Secondary | ICD-10-CM | POA: Diagnosis not present

## 2013-11-12 DIAGNOSIS — Z87891 Personal history of nicotine dependence: Secondary | ICD-10-CM | POA: Diagnosis not present

## 2013-11-12 DIAGNOSIS — K625 Hemorrhage of anus and rectum: Secondary | ICD-10-CM | POA: Diagnosis not present

## 2013-11-12 DIAGNOSIS — K648 Other hemorrhoids: Secondary | ICD-10-CM | POA: Diagnosis not present

## 2013-11-12 DIAGNOSIS — R195 Other fecal abnormalities: Secondary | ICD-10-CM | POA: Diagnosis not present

## 2013-11-12 DIAGNOSIS — Z7982 Long term (current) use of aspirin: Secondary | ICD-10-CM | POA: Diagnosis not present

## 2013-11-12 DIAGNOSIS — I10 Essential (primary) hypertension: Secondary | ICD-10-CM | POA: Diagnosis not present

## 2013-11-12 DIAGNOSIS — Z8371 Family history of colonic polyps: Secondary | ICD-10-CM | POA: Diagnosis not present

## 2013-11-12 DIAGNOSIS — Z79899 Other long term (current) drug therapy: Secondary | ICD-10-CM | POA: Diagnosis not present

## 2013-11-12 DIAGNOSIS — D126 Benign neoplasm of colon, unspecified: Secondary | ICD-10-CM | POA: Diagnosis not present

## 2013-11-12 DIAGNOSIS — Z96659 Presence of unspecified artificial knee joint: Secondary | ICD-10-CM | POA: Diagnosis not present

## 2013-11-12 DIAGNOSIS — K5730 Diverticulosis of large intestine without perforation or abscess without bleeding: Secondary | ICD-10-CM | POA: Diagnosis not present

## 2013-11-12 DIAGNOSIS — I2510 Atherosclerotic heart disease of native coronary artery without angina pectoris: Secondary | ICD-10-CM | POA: Diagnosis not present

## 2013-11-15 LAB — PATHOLOGY REPORT

## 2013-12-16 DIAGNOSIS — N183 Chronic kidney disease, stage 3 unspecified: Secondary | ICD-10-CM | POA: Diagnosis not present

## 2013-12-16 DIAGNOSIS — N039 Chronic nephritic syndrome with unspecified morphologic changes: Secondary | ICD-10-CM | POA: Diagnosis not present

## 2013-12-16 DIAGNOSIS — I10 Essential (primary) hypertension: Secondary | ICD-10-CM | POA: Diagnosis not present

## 2013-12-16 DIAGNOSIS — E7800 Pure hypercholesterolemia, unspecified: Secondary | ICD-10-CM | POA: Diagnosis not present

## 2013-12-16 DIAGNOSIS — N2581 Secondary hyperparathyroidism of renal origin: Secondary | ICD-10-CM | POA: Diagnosis not present

## 2013-12-16 DIAGNOSIS — D631 Anemia in chronic kidney disease: Secondary | ICD-10-CM | POA: Diagnosis not present

## 2013-12-31 DIAGNOSIS — M25579 Pain in unspecified ankle and joints of unspecified foot: Secondary | ICD-10-CM | POA: Diagnosis not present

## 2013-12-31 DIAGNOSIS — M25519 Pain in unspecified shoulder: Secondary | ICD-10-CM | POA: Diagnosis not present

## 2014-01-02 DIAGNOSIS — M19079 Primary osteoarthritis, unspecified ankle and foot: Secondary | ICD-10-CM | POA: Diagnosis not present

## 2014-01-02 DIAGNOSIS — M659 Synovitis and tenosynovitis, unspecified: Secondary | ICD-10-CM | POA: Diagnosis not present

## 2014-01-02 DIAGNOSIS — M2140 Flat foot [pes planus] (acquired), unspecified foot: Secondary | ICD-10-CM | POA: Diagnosis not present

## 2014-01-07 DIAGNOSIS — Z23 Encounter for immunization: Secondary | ICD-10-CM | POA: Diagnosis not present

## 2014-01-07 DIAGNOSIS — F411 Generalized anxiety disorder: Secondary | ICD-10-CM | POA: Diagnosis not present

## 2014-01-07 DIAGNOSIS — R5381 Other malaise: Secondary | ICD-10-CM | POA: Diagnosis not present

## 2014-01-07 DIAGNOSIS — I10 Essential (primary) hypertension: Secondary | ICD-10-CM | POA: Diagnosis not present

## 2014-01-07 DIAGNOSIS — E785 Hyperlipidemia, unspecified: Secondary | ICD-10-CM | POA: Diagnosis not present

## 2014-01-07 DIAGNOSIS — R5383 Other fatigue: Secondary | ICD-10-CM | POA: Diagnosis not present

## 2014-02-05 DIAGNOSIS — I10 Essential (primary) hypertension: Secondary | ICD-10-CM | POA: Diagnosis not present

## 2014-02-05 DIAGNOSIS — Z23 Encounter for immunization: Secondary | ICD-10-CM | POA: Diagnosis not present

## 2014-02-05 DIAGNOSIS — I6529 Occlusion and stenosis of unspecified carotid artery: Secondary | ICD-10-CM | POA: Diagnosis not present

## 2014-02-05 DIAGNOSIS — R5381 Other malaise: Secondary | ICD-10-CM | POA: Diagnosis not present

## 2014-02-05 DIAGNOSIS — R5383 Other fatigue: Secondary | ICD-10-CM | POA: Diagnosis not present

## 2014-02-05 DIAGNOSIS — E785 Hyperlipidemia, unspecified: Secondary | ICD-10-CM | POA: Diagnosis not present

## 2014-02-10 DIAGNOSIS — M19079 Primary osteoarthritis, unspecified ankle and foot: Secondary | ICD-10-CM | POA: Diagnosis not present

## 2014-02-11 ENCOUNTER — Ambulatory Visit: Payer: Self-pay | Admitting: Family Medicine

## 2014-02-11 DIAGNOSIS — I6529 Occlusion and stenosis of unspecified carotid artery: Secondary | ICD-10-CM | POA: Diagnosis not present

## 2014-02-11 DIAGNOSIS — I658 Occlusion and stenosis of other precerebral arteries: Secondary | ICD-10-CM | POA: Diagnosis not present

## 2014-02-11 DIAGNOSIS — E049 Nontoxic goiter, unspecified: Secondary | ICD-10-CM | POA: Diagnosis not present

## 2014-02-17 DIAGNOSIS — R51 Headache: Secondary | ICD-10-CM | POA: Diagnosis not present

## 2014-02-17 DIAGNOSIS — E785 Hyperlipidemia, unspecified: Secondary | ICD-10-CM | POA: Diagnosis not present

## 2014-02-17 DIAGNOSIS — R42 Dizziness and giddiness: Secondary | ICD-10-CM | POA: Diagnosis not present

## 2014-02-17 DIAGNOSIS — I10 Essential (primary) hypertension: Secondary | ICD-10-CM | POA: Diagnosis not present

## 2014-02-17 DIAGNOSIS — I6529 Occlusion and stenosis of unspecified carotid artery: Secondary | ICD-10-CM | POA: Diagnosis not present

## 2014-02-17 DIAGNOSIS — F17200 Nicotine dependence, unspecified, uncomplicated: Secondary | ICD-10-CM | POA: Diagnosis not present

## 2014-02-17 DIAGNOSIS — I6350 Cerebral infarction due to unspecified occlusion or stenosis of unspecified cerebral artery: Secondary | ICD-10-CM | POA: Diagnosis not present

## 2014-02-17 DIAGNOSIS — R5383 Other fatigue: Secondary | ICD-10-CM | POA: Diagnosis not present

## 2014-02-17 DIAGNOSIS — R5381 Other malaise: Secondary | ICD-10-CM | POA: Diagnosis not present

## 2014-02-17 DIAGNOSIS — I779 Disorder of arteries and arterioles, unspecified: Secondary | ICD-10-CM | POA: Diagnosis not present

## 2014-02-17 LAB — CBC WITH DIFFERENTIAL/PLATELET
BASOS PCT: 2.1 %
Basophil #: 0.1 10*3/uL (ref 0.0–0.1)
EOS ABS: 0.4 10*3/uL (ref 0.0–0.7)
Eosinophil %: 5.3 %
HCT: 42 % (ref 35.0–47.0)
HGB: 13.9 g/dL (ref 12.0–16.0)
LYMPHS ABS: 1.1 10*3/uL (ref 1.0–3.6)
Lymphocyte %: 16.1 %
MCH: 30.4 pg (ref 26.0–34.0)
MCHC: 33 g/dL (ref 32.0–36.0)
MCV: 92 fL (ref 80–100)
Monocyte #: 0.4 x10 3/mm (ref 0.2–0.9)
Monocyte %: 5.7 %
NEUTROS ABS: 4.7 10*3/uL (ref 1.4–6.5)
NEUTROS PCT: 70.8 %
Platelet: 169 10*3/uL (ref 150–440)
RBC: 4.57 10*6/uL (ref 3.80–5.20)
RDW: 15.3 % — ABNORMAL HIGH (ref 11.5–14.5)
WBC: 6.6 10*3/uL (ref 3.6–11.0)

## 2014-02-17 LAB — URINALYSIS, COMPLETE
Bacteria: NEGATIVE
Bilirubin,UR: NEGATIVE
Glucose,UR: NEGATIVE mg/dL (ref 0–75)
KETONE: NEGATIVE
NITRITE: NEGATIVE
PROTEIN: NEGATIVE
Ph: 6 (ref 4.5–8.0)
Specific Gravity: 1.015 (ref 1.003–1.030)

## 2014-02-17 LAB — COMPREHENSIVE METABOLIC PANEL
ALK PHOS: 58 U/L
Albumin: 4 g/dL (ref 3.4–5.0)
Anion Gap: 1 — ABNORMAL LOW (ref 7–16)
BILIRUBIN TOTAL: 0.4 mg/dL (ref 0.2–1.0)
BUN: 30 mg/dL — AB (ref 7–18)
CHLORIDE: 103 mmol/L (ref 98–107)
CREATININE: 1.29 mg/dL (ref 0.60–1.30)
Calcium, Total: 9 mg/dL (ref 8.5–10.1)
Co2: 33 mmol/L — ABNORMAL HIGH (ref 21–32)
EGFR (Non-African Amer.): 40 — ABNORMAL LOW
GFR CALC AF AMER: 47 — AB
GLUCOSE: 92 mg/dL (ref 65–99)
Osmolality: 280 (ref 275–301)
Potassium: 4.6 mmol/L (ref 3.5–5.1)
SGOT(AST): 20 U/L (ref 15–37)
SGPT (ALT): 18 U/L (ref 12–78)
SODIUM: 137 mmol/L (ref 136–145)
TOTAL PROTEIN: 7.8 g/dL (ref 6.4–8.2)

## 2014-02-17 LAB — TROPONIN I

## 2014-02-18 DIAGNOSIS — I6529 Occlusion and stenosis of unspecified carotid artery: Secondary | ICD-10-CM | POA: Diagnosis not present

## 2014-02-18 DIAGNOSIS — I10 Essential (primary) hypertension: Secondary | ICD-10-CM | POA: Diagnosis not present

## 2014-02-18 DIAGNOSIS — R42 Dizziness and giddiness: Secondary | ICD-10-CM | POA: Diagnosis not present

## 2014-02-18 DIAGNOSIS — F17200 Nicotine dependence, unspecified, uncomplicated: Secondary | ICD-10-CM | POA: Diagnosis not present

## 2014-02-18 DIAGNOSIS — I779 Disorder of arteries and arterioles, unspecified: Secondary | ICD-10-CM | POA: Diagnosis not present

## 2014-02-18 DIAGNOSIS — E785 Hyperlipidemia, unspecified: Secondary | ICD-10-CM | POA: Diagnosis not present

## 2014-02-18 LAB — CBC WITH DIFFERENTIAL/PLATELET
BASOS PCT: 0.8 %
Basophil #: 0.1 10*3/uL (ref 0.0–0.1)
Eosinophil #: 0.3 10*3/uL (ref 0.0–0.7)
Eosinophil %: 5.1 %
HCT: 40.4 % (ref 35.0–47.0)
HGB: 13.4 g/dL (ref 12.0–16.0)
LYMPHS ABS: 1.6 10*3/uL (ref 1.0–3.6)
Lymphocyte %: 25.9 %
MCH: 30.8 pg (ref 26.0–34.0)
MCHC: 33.2 g/dL (ref 32.0–36.0)
MCV: 93 fL (ref 80–100)
MONOS PCT: 6.8 %
Monocyte #: 0.4 x10 3/mm (ref 0.2–0.9)
Neutrophil #: 3.8 10*3/uL (ref 1.4–6.5)
Neutrophil %: 61.4 %
Platelet: 177 10*3/uL (ref 150–440)
RBC: 4.37 10*6/uL (ref 3.80–5.20)
RDW: 15.6 % — AB (ref 11.5–14.5)
WBC: 6.2 10*3/uL (ref 3.6–11.0)

## 2014-02-18 LAB — LIPID PANEL
Cholesterol: 169 mg/dL (ref 0–200)
HDL Cholesterol: 44 mg/dL (ref 40–60)
Ldl Cholesterol, Calc: 90 mg/dL (ref 0–100)
Triglycerides: 175 mg/dL (ref 0–200)
VLDL Cholesterol, Calc: 35 mg/dL (ref 5–40)

## 2014-02-18 LAB — BASIC METABOLIC PANEL
ANION GAP: 3 — AB (ref 7–16)
BUN: 23 mg/dL — ABNORMAL HIGH (ref 7–18)
CALCIUM: 9.3 mg/dL (ref 8.5–10.1)
CO2: 30 mmol/L (ref 21–32)
CREATININE: 1.25 mg/dL (ref 0.60–1.30)
Chloride: 105 mmol/L (ref 98–107)
EGFR (Non-African Amer.): 42 — ABNORMAL LOW
GFR CALC AF AMER: 48 — AB
Glucose: 80 mg/dL (ref 65–99)
Osmolality: 278 (ref 275–301)
Potassium: 4.6 mmol/L (ref 3.5–5.1)
SODIUM: 138 mmol/L (ref 136–145)

## 2014-02-18 LAB — TSH: THYROID STIMULATING HORM: 3.62 u[IU]/mL

## 2014-02-19 DIAGNOSIS — R42 Dizziness and giddiness: Secondary | ICD-10-CM | POA: Diagnosis not present

## 2014-02-19 DIAGNOSIS — I779 Disorder of arteries and arterioles, unspecified: Secondary | ICD-10-CM | POA: Diagnosis not present

## 2014-02-19 DIAGNOSIS — F17200 Nicotine dependence, unspecified, uncomplicated: Secondary | ICD-10-CM | POA: Diagnosis not present

## 2014-02-19 DIAGNOSIS — E785 Hyperlipidemia, unspecified: Secondary | ICD-10-CM | POA: Diagnosis not present

## 2014-02-19 DIAGNOSIS — I10 Essential (primary) hypertension: Secondary | ICD-10-CM | POA: Diagnosis not present

## 2014-02-19 DIAGNOSIS — I6529 Occlusion and stenosis of unspecified carotid artery: Secondary | ICD-10-CM | POA: Diagnosis not present

## 2014-02-20 ENCOUNTER — Telehealth: Payer: Self-pay

## 2014-02-20 ENCOUNTER — Inpatient Hospital Stay: Payer: Self-pay | Admitting: Internal Medicine

## 2014-02-20 DIAGNOSIS — N183 Chronic kidney disease, stage 3 unspecified: Secondary | ICD-10-CM | POA: Diagnosis present

## 2014-02-20 DIAGNOSIS — I129 Hypertensive chronic kidney disease with stage 1 through stage 4 chronic kidney disease, or unspecified chronic kidney disease: Secondary | ICD-10-CM | POA: Diagnosis present

## 2014-02-20 DIAGNOSIS — I10 Essential (primary) hypertension: Secondary | ICD-10-CM | POA: Diagnosis not present

## 2014-02-20 DIAGNOSIS — I6529 Occlusion and stenosis of unspecified carotid artery: Secondary | ICD-10-CM | POA: Diagnosis present

## 2014-02-20 DIAGNOSIS — I498 Other specified cardiac arrhythmias: Secondary | ICD-10-CM | POA: Diagnosis present

## 2014-02-20 DIAGNOSIS — Z7982 Long term (current) use of aspirin: Secondary | ICD-10-CM | POA: Diagnosis not present

## 2014-02-20 DIAGNOSIS — F17200 Nicotine dependence, unspecified, uncomplicated: Secondary | ICD-10-CM | POA: Diagnosis present

## 2014-02-20 DIAGNOSIS — F329 Major depressive disorder, single episode, unspecified: Secondary | ICD-10-CM | POA: Diagnosis present

## 2014-02-20 DIAGNOSIS — R42 Dizziness and giddiness: Secondary | ICD-10-CM | POA: Diagnosis not present

## 2014-02-20 DIAGNOSIS — E785 Hyperlipidemia, unspecified: Secondary | ICD-10-CM | POA: Diagnosis present

## 2014-02-20 DIAGNOSIS — G8929 Other chronic pain: Secondary | ICD-10-CM | POA: Diagnosis present

## 2014-02-20 DIAGNOSIS — I779 Disorder of arteries and arterioles, unspecified: Secondary | ICD-10-CM | POA: Diagnosis not present

## 2014-02-20 DIAGNOSIS — IMO0002 Reserved for concepts with insufficient information to code with codable children: Secondary | ICD-10-CM | POA: Diagnosis present

## 2014-02-20 DIAGNOSIS — F3289 Other specified depressive episodes: Secondary | ICD-10-CM | POA: Diagnosis present

## 2014-02-20 NOTE — Telephone Encounter (Signed)
Per Dr. Rockey Situ, pt needs pre-op appt prior to carotid surgery. Attempted to contact pt to schedule this.   No answer, no machine.

## 2014-02-21 NOTE — Telephone Encounter (Signed)
Hospital called to set this up.  Pt sched next Wed 02/26/14.

## 2014-02-26 ENCOUNTER — Encounter: Payer: Self-pay | Admitting: Cardiovascular Disease

## 2014-02-26 ENCOUNTER — Ambulatory Visit (INDEPENDENT_AMBULATORY_CARE_PROVIDER_SITE_OTHER): Payer: Medicare Other | Admitting: Cardiovascular Disease

## 2014-02-26 VITALS — BP 130/98 | HR 72 | Ht 62.0 in | Wt 188.5 lb

## 2014-02-26 DIAGNOSIS — I6529 Occlusion and stenosis of unspecified carotid artery: Secondary | ICD-10-CM

## 2014-02-26 DIAGNOSIS — R0602 Shortness of breath: Secondary | ICD-10-CM | POA: Diagnosis not present

## 2014-02-26 DIAGNOSIS — E785 Hyperlipidemia, unspecified: Secondary | ICD-10-CM

## 2014-02-26 DIAGNOSIS — I498 Other specified cardiac arrhythmias: Secondary | ICD-10-CM | POA: Diagnosis not present

## 2014-02-26 DIAGNOSIS — I471 Supraventricular tachycardia: Secondary | ICD-10-CM

## 2014-02-26 NOTE — Assessment & Plan Note (Signed)
We'll try to obtain her most recent lipid panel. If LDL is above goal, would increase her simvastatin to 20 mg daily

## 2014-02-26 NOTE — Progress Notes (Signed)
Patient ID: Carolyn Hendricks, female    DOB: 1935-11-05, 78 y.o.   MRN: [id number]  HPI Comments: Ms. Carolyn Hendricks is a 78 year old woman with history of hypertension, MRSA, long smoking history for 20 years, arthritis, presented to the emergency room May 26th 2013 with a buttock pain from abscess, also with malaise, nausea vomiting, fever. Cardiology consult in for atrial tachycardia, heart rate of 150 beats per minute. Heart rate resolved with calcium channel blocker. Noted to have left carotid bruit with ultrasound showing 75% plus left carotid stenosis. She presents for routine followup On previous office visit, she reported having episodes of tachycardia lasting for hours at a time dating back many years  Notes indicate recent evaluation with angiography of her carotid 02/20/2014 showing severe disease on the left. Patient reports she is to have carotid endarterectomy surgery in the near future. She has followup with Dr. Lucky Cowboy in one or 2 weeks. Notes indicate possible TIA recently evaluation in the hospital past week, discharge 02/20/2014. She had dizziness, lightheadedness, bradycardia. She was continued on her beta blocker, Cardizem was held for bradycardia  Echocardiogram performed 02/17/2014 while she was in the hospital. This showed normal ejection fraction, diastolic dysfunction, mild MR and TR She stopped smoking approximately 2 years ago. Reports that her blood pressure at home is typically in the 130/70 range She is active, limited by her low back pain with radiating pain down her leg at times. She's able to do light housework, works in her garden, does potting. Able to ambulate and do short shopping trips without any chest discomfort or shortness of breath. She denies having any anginal-type symptoms.  She is tolerating simvastatin 10 mg daily with no side effects. She reports having side effects on Crestor and Lipitor.  She has  seen the renal service and diagnosed with stage III chronic kidney  disease.  Echocardiogram in the hospital 03/04/2012 showed normal LV systolic function, diastolic dysfunction, otherwise normal study  Lab work from the hospital shows TSH 1.55, creatinine 1.39 BUN 26  EKG from the hospital shows sinus tachycardia rate 144 beats per minute  EKG shows normal sinus rhythm with rate 72 beats per minute with APCs   Outpatient Encounter Prescriptions as of 02/26/2014  Medication Sig  . aspirin 81 MG tablet Take 81 mg by mouth daily.  . Cyanocobalamin (VITAMIN B-12 PO) Take by mouth daily.  Marland Kitchen HYDROcodone-acetaminophen (VICODIN) 5-500 MG per tablet Take 1 tablet by mouth every 6 (six) hours as needed.  Marland Kitchen levocetirizine (XYZAL) 5 MG tablet Take 5 mg by mouth every evening.  Marland Kitchen LORazepam (ATIVAN) 0.5 MG tablet Take 0.5 mg by mouth every 8 (eight) hours.  . metoprolol tartrate (LOPRESSOR) 25 MG tablet Take 25 mg by mouth 2 (two) times daily.  . Multiple Vitamin (MULTI-VITAMIN DAILY PO) Take by mouth.  . sertraline (ZOLOFT) 50 MG tablet Take 50 mg by mouth daily.  . valsartan-hydrochlorothiazide (DIOVAN-HCT) 320-12.5 MG per tablet Take 1/2 tablet by mouth every day    Review of Systems  Constitutional: Negative.   HENT: Negative.   Eyes: Negative.   Respiratory: Negative.   Cardiovascular: Negative.   Gastrointestinal: Negative.   Endocrine: Negative.   Musculoskeletal: Positive for back pain.  Skin: Negative.   Allergic/Immunologic: Negative.   Neurological: Negative.   Hematological: Negative.   Psychiatric/Behavioral: Negative.   All other systems reviewed and are negative.  BP 130/98  Pulse 72  Ht _0  (1.575 m)  Wt 188 lb 8 oz (85.503 kg)  BMI 34.47 kg/m2  Physical Exam  Nursing note and vitals reviewed. Constitutional: She is oriented to person, place, and time. She appears well-developed and well-nourished.  HENT:  Head: Normocephalic.  Nose: Nose normal.  Mouth/Throat: Oropharynx is clear and moist.  Eyes: Conjunctivae are normal.  Pupils are equal, round, and reactive to light.  Neck: Normal range of motion. Neck supple. No JVD present. Carotid bruit is present.  Cardiovascular: Normal rate, regular rhythm, S1 normal, S2 normal, normal heart sounds and intact distal pulses.  Exam reveals no gallop and no friction rub.   No murmur heard. Pulmonary/Chest: Effort normal. No respiratory distress. She has decreased breath sounds. She has no wheezes. She has no rales. She exhibits no tenderness.  Abdominal: Soft. Bowel sounds are normal. She exhibits no distension. There is no tenderness.  Musculoskeletal: Normal range of motion. She exhibits no edema and no tenderness.  Lymphadenopathy:    She has no cervical adenopathy.  Neurological: She is alert and oriented to person, place, and time. Coordination normal.  Skin: Skin is warm and dry. No rash noted. No erythema.  Psychiatric: She has a normal mood and affect. Her behavior is normal. Judgment and thought content normal.    Assessment and Plan

## 2014-02-26 NOTE — Assessment & Plan Note (Addendum)
Hospital records were reviewed from last week. She would be acceptable risk for upcoming left carotid stenosis surgery. Patient has indicated she is not a candidate for stenting given her renal dysfunction, likely will require carotid endarterectomy. Currently with no anginal symptoms. Suggested she proceed with the surgery, we'll continue her on beta blocker. No further testing at this time as this would likely unnecessarily delay her procedure.

## 2014-02-26 NOTE — Assessment & Plan Note (Signed)
Currently on her beta blocker eared no recurrent episodes of atrial tachycardia. We'll need to watch her closely as Cardizem was recently held while she was in the hospital for TIA and dizziness. Notes indicate bradycardia but details are unavailable.

## 2014-02-26 NOTE — Patient Instructions (Addendum)
You are doing well. No medication changes were made.  Please call us if you have new issues that need to be addressed before your next appt.  Your physician wants you to follow-up in: 12 months.  You will receive a reminder letter in the mail two months in advance. If you don't receive a letter, please call our office to schedule the follow-up appointment.

## 2014-03-07 DIAGNOSIS — I6529 Occlusion and stenosis of unspecified carotid artery: Secondary | ICD-10-CM | POA: Diagnosis not present

## 2014-03-07 DIAGNOSIS — N189 Chronic kidney disease, unspecified: Secondary | ICD-10-CM | POA: Diagnosis not present

## 2014-03-10 ENCOUNTER — Telehealth: Payer: Self-pay

## 2014-03-10 NOTE — Telephone Encounter (Signed)
Nurse with [facility] needs cardiac clearance for surgery. Please call. Will fax clearance form.

## 2014-03-10 NOTE — Telephone Encounter (Signed)
Last ov 02/26/14, upcoming appt w/ Dr. Lucky Cowboy is mentioned.  If you will add an addendum clearing her for surgery, I will fax to [facility].  Thank you!

## 2014-03-11 NOTE — Telephone Encounter (Signed)
Already put clearance in my office note Please see assessment and plan

## 2014-03-12 NOTE — Telephone Encounter (Signed)
Faxed office note to [facility] at [phone].

## 2014-03-13 ENCOUNTER — Ambulatory Visit: Payer: Self-pay | Admitting: Vascular Surgery

## 2014-03-13 DIAGNOSIS — I6529 Occlusion and stenosis of unspecified carotid artery: Secondary | ICD-10-CM | POA: Diagnosis not present

## 2014-03-13 DIAGNOSIS — I10 Essential (primary) hypertension: Secondary | ICD-10-CM | POA: Diagnosis not present

## 2014-03-13 DIAGNOSIS — Z01810 Encounter for preprocedural cardiovascular examination: Secondary | ICD-10-CM | POA: Diagnosis not present

## 2014-03-13 DIAGNOSIS — Z01818 Encounter for other preprocedural examination: Secondary | ICD-10-CM | POA: Diagnosis not present

## 2014-03-13 DIAGNOSIS — I499 Cardiac arrhythmia, unspecified: Secondary | ICD-10-CM | POA: Diagnosis not present

## 2014-03-13 DIAGNOSIS — Z01812 Encounter for preprocedural laboratory examination: Secondary | ICD-10-CM | POA: Diagnosis not present

## 2014-03-13 DIAGNOSIS — I2510 Atherosclerotic heart disease of native coronary artery without angina pectoris: Secondary | ICD-10-CM | POA: Diagnosis not present

## 2014-03-13 LAB — BASIC METABOLIC PANEL
Anion Gap: 3 — ABNORMAL LOW (ref 7–16)
BUN: 32 mg/dL — ABNORMAL HIGH (ref 7–18)
CHLORIDE: 105 mmol/L (ref 98–107)
CREATININE: 1.38 mg/dL — AB (ref 0.60–1.30)
Calcium, Total: 9.1 mg/dL (ref 8.5–10.1)
Co2: 32 mmol/L (ref 21–32)
EGFR (African American): 43 — ABNORMAL LOW
EGFR (Non-African Amer.): 37 — ABNORMAL LOW
Glucose: 85 mg/dL (ref 65–99)
Osmolality: 286 (ref 275–301)
Potassium: 5 mmol/L (ref 3.5–5.1)
SODIUM: 140 mmol/L (ref 136–145)

## 2014-03-13 LAB — URINALYSIS, COMPLETE
BACTERIA: NONE SEEN
BILIRUBIN, UR: NEGATIVE
BLOOD: NEGATIVE
Glucose,UR: NEGATIVE mg/dL (ref 0–75)
Hyaline Cast: 44
KETONE: NEGATIVE
Nitrite: NEGATIVE
PH: 5 (ref 4.5–8.0)
Protein: NEGATIVE
Specific Gravity: 1.026 (ref 1.003–1.030)
WBC UR: 2 /HPF (ref 0–5)

## 2014-03-13 LAB — CBC
HCT: 39.6 % (ref 35.0–47.0)
HGB: 13 g/dL (ref 12.0–16.0)
MCH: 30.9 pg (ref 26.0–34.0)
MCHC: 32.9 g/dL (ref 32.0–36.0)
MCV: 94 fL (ref 80–100)
Platelet: 191 10*3/uL (ref 150–440)
RBC: 4.22 10*6/uL (ref 3.80–5.20)
RDW: 15.1 % — AB (ref 11.5–14.5)
WBC: 7.4 10*3/uL (ref 3.6–11.0)

## 2014-03-20 ENCOUNTER — Inpatient Hospital Stay: Payer: Self-pay | Admitting: Vascular Surgery

## 2014-03-20 DIAGNOSIS — N289 Disorder of kidney and ureter, unspecified: Secondary | ICD-10-CM | POA: Diagnosis not present

## 2014-03-20 DIAGNOSIS — I708 Atherosclerosis of other arteries: Secondary | ICD-10-CM | POA: Diagnosis not present

## 2014-03-20 DIAGNOSIS — I6529 Occlusion and stenosis of unspecified carotid artery: Secondary | ICD-10-CM | POA: Diagnosis not present

## 2014-03-20 DIAGNOSIS — E785 Hyperlipidemia, unspecified: Secondary | ICD-10-CM | POA: Diagnosis present

## 2014-03-20 DIAGNOSIS — I739 Peripheral vascular disease, unspecified: Secondary | ICD-10-CM | POA: Diagnosis not present

## 2014-03-20 DIAGNOSIS — G4700 Insomnia, unspecified: Secondary | ICD-10-CM | POA: Diagnosis present

## 2014-03-20 DIAGNOSIS — IMO0002 Reserved for concepts with insufficient information to code with codable children: Secondary | ICD-10-CM | POA: Diagnosis present

## 2014-03-20 DIAGNOSIS — R51 Headache: Secondary | ICD-10-CM | POA: Diagnosis not present

## 2014-03-20 DIAGNOSIS — M1990 Unspecified osteoarthritis, unspecified site: Secondary | ICD-10-CM | POA: Diagnosis present

## 2014-03-20 DIAGNOSIS — F17200 Nicotine dependence, unspecified, uncomplicated: Secondary | ICD-10-CM | POA: Diagnosis present

## 2014-03-20 DIAGNOSIS — E669 Obesity, unspecified: Secondary | ICD-10-CM | POA: Diagnosis present

## 2014-03-20 DIAGNOSIS — I10 Essential (primary) hypertension: Secondary | ICD-10-CM | POA: Diagnosis not present

## 2014-03-20 DIAGNOSIS — Z96659 Presence of unspecified artificial knee joint: Secondary | ICD-10-CM | POA: Diagnosis not present

## 2014-03-20 DIAGNOSIS — M899 Disorder of bone, unspecified: Secondary | ICD-10-CM | POA: Diagnosis present

## 2014-03-20 DIAGNOSIS — N189 Chronic kidney disease, unspecified: Secondary | ICD-10-CM | POA: Diagnosis present

## 2014-03-20 DIAGNOSIS — M949 Disorder of cartilage, unspecified: Secondary | ICD-10-CM | POA: Diagnosis present

## 2014-03-20 DIAGNOSIS — Z6833 Body mass index (BMI) 33.0-33.9, adult: Secondary | ICD-10-CM | POA: Diagnosis not present

## 2014-03-20 DIAGNOSIS — F411 Generalized anxiety disorder: Secondary | ICD-10-CM | POA: Diagnosis not present

## 2014-03-20 DIAGNOSIS — I129 Hypertensive chronic kidney disease with stage 1 through stage 4 chronic kidney disease, or unspecified chronic kidney disease: Secondary | ICD-10-CM | POA: Diagnosis present

## 2014-03-20 DIAGNOSIS — F329 Major depressive disorder, single episode, unspecified: Secondary | ICD-10-CM | POA: Diagnosis present

## 2014-03-20 DIAGNOSIS — F3289 Other specified depressive episodes: Secondary | ICD-10-CM | POA: Diagnosis present

## 2014-03-20 DIAGNOSIS — Z7982 Long term (current) use of aspirin: Secondary | ICD-10-CM | POA: Diagnosis not present

## 2014-03-21 LAB — CBC WITH DIFFERENTIAL/PLATELET
BASOS PCT: 0.8 %
Basophil #: 0.1 10*3/uL (ref 0.0–0.1)
Eosinophil #: 0.2 10*3/uL (ref 0.0–0.7)
Eosinophil %: 2.9 %
HCT: 32.6 % — AB (ref 35.0–47.0)
HGB: 10.9 g/dL — ABNORMAL LOW (ref 12.0–16.0)
Lymphocyte #: 0.9 10*3/uL — ABNORMAL LOW (ref 1.0–3.6)
Lymphocyte %: 13.5 %
MCH: 31.3 pg (ref 26.0–34.0)
MCHC: 33.5 g/dL (ref 32.0–36.0)
MCV: 93 fL (ref 80–100)
MONO ABS: 0.5 x10 3/mm (ref 0.2–0.9)
MONOS PCT: 8.1 %
NEUTROS ABS: 4.8 10*3/uL (ref 1.4–6.5)
Neutrophil %: 74.7 %
PLATELETS: 132 10*3/uL — AB (ref 150–440)
RBC: 3.49 10*6/uL — AB (ref 3.80–5.20)
RDW: 15.2 % — AB (ref 11.5–14.5)
WBC: 6.5 10*3/uL (ref 3.6–11.0)

## 2014-03-21 LAB — BASIC METABOLIC PANEL
ANION GAP: 3 — AB (ref 7–16)
BUN: 14 mg/dL (ref 7–18)
CHLORIDE: 106 mmol/L (ref 98–107)
CO2: 30 mmol/L (ref 21–32)
Calcium, Total: 8 mg/dL — ABNORMAL LOW (ref 8.5–10.1)
Creatinine: 1.05 mg/dL (ref 0.60–1.30)
EGFR (African American): 60 — ABNORMAL LOW
EGFR (Non-African Amer.): 52 — ABNORMAL LOW
GLUCOSE: 97 mg/dL (ref 65–99)
Osmolality: 278 (ref 275–301)
POTASSIUM: 4.5 mmol/L (ref 3.5–5.1)
Sodium: 139 mmol/L (ref 136–145)

## 2014-03-21 LAB — PROTIME-INR
INR: 1
Prothrombin Time: 13.2 secs (ref 11.5–14.7)

## 2014-03-21 LAB — APTT: ACTIVATED PTT: 33.6 s (ref 23.6–35.9)

## 2014-03-24 LAB — CBC WITH DIFFERENTIAL/PLATELET
BASOS PCT: 1.2 %
Basophil #: 0.1 10*3/uL (ref 0.0–0.1)
EOS PCT: 4.5 %
Eosinophil #: 0.3 10*3/uL (ref 0.0–0.7)
HCT: 37 % (ref 35.0–47.0)
HGB: 12.4 g/dL (ref 12.0–16.0)
LYMPHS PCT: 22.2 %
Lymphocyte #: 1.3 10*3/uL (ref 1.0–3.6)
MCH: 31 pg (ref 26.0–34.0)
MCHC: 33.6 g/dL (ref 32.0–36.0)
MCV: 92 fL (ref 80–100)
Monocyte #: 0.5 x10 3/mm (ref 0.2–0.9)
Monocyte %: 8.3 %
Neutrophil #: 3.9 10*3/uL (ref 1.4–6.5)
Neutrophil %: 63.8 %
PLATELETS: 143 10*3/uL — AB (ref 150–440)
RBC: 4 10*6/uL (ref 3.80–5.20)
RDW: 14.6 % — AB (ref 11.5–14.5)
WBC: 6.1 10*3/uL (ref 3.6–11.0)

## 2014-03-24 LAB — BASIC METABOLIC PANEL
ANION GAP: 3 — AB (ref 7–16)
BUN: 19 mg/dL — AB (ref 7–18)
Calcium, Total: 9 mg/dL (ref 8.5–10.1)
Chloride: 102 mmol/L (ref 98–107)
Co2: 33 mmol/L — ABNORMAL HIGH (ref 21–32)
Creatinine: 1.17 mg/dL (ref 0.60–1.30)
GFR CALC AF AMER: 52 — AB
GFR CALC NON AF AMER: 45 — AB
GLUCOSE: 83 mg/dL (ref 65–99)
Osmolality: 277 (ref 275–301)
Potassium: 3.9 mmol/L (ref 3.5–5.1)
Sodium: 138 mmol/L (ref 136–145)

## 2014-03-24 LAB — PATHOLOGY REPORT

## 2014-04-10 DIAGNOSIS — I499 Cardiac arrhythmia, unspecified: Secondary | ICD-10-CM | POA: Diagnosis not present

## 2014-04-10 DIAGNOSIS — I6529 Occlusion and stenosis of unspecified carotid artery: Secondary | ICD-10-CM | POA: Diagnosis not present

## 2014-04-10 DIAGNOSIS — I10 Essential (primary) hypertension: Secondary | ICD-10-CM | POA: Diagnosis not present

## 2014-04-28 DIAGNOSIS — M48061 Spinal stenosis, lumbar region without neurogenic claudication: Secondary | ICD-10-CM | POA: Diagnosis not present

## 2014-04-28 DIAGNOSIS — IMO0002 Reserved for concepts with insufficient information to code with codable children: Secondary | ICD-10-CM | POA: Diagnosis not present

## 2014-04-28 DIAGNOSIS — M47817 Spondylosis without myelopathy or radiculopathy, lumbosacral region: Secondary | ICD-10-CM | POA: Diagnosis not present

## 2014-06-06 DIAGNOSIS — I10 Essential (primary) hypertension: Secondary | ICD-10-CM | POA: Diagnosis not present

## 2014-06-06 DIAGNOSIS — R209 Unspecified disturbances of skin sensation: Secondary | ICD-10-CM | POA: Diagnosis not present

## 2014-06-06 DIAGNOSIS — M545 Low back pain, unspecified: Secondary | ICD-10-CM | POA: Diagnosis not present

## 2014-06-06 DIAGNOSIS — A4902 Methicillin resistant Staphylococcus aureus infection, unspecified site: Secondary | ICD-10-CM | POA: Diagnosis not present

## 2014-06-06 DIAGNOSIS — G2581 Restless legs syndrome: Secondary | ICD-10-CM | POA: Diagnosis not present

## 2014-06-23 DIAGNOSIS — R609 Edema, unspecified: Secondary | ICD-10-CM | POA: Diagnosis not present

## 2014-06-23 DIAGNOSIS — I10 Essential (primary) hypertension: Secondary | ICD-10-CM | POA: Diagnosis not present

## 2014-06-23 DIAGNOSIS — N2581 Secondary hyperparathyroidism of renal origin: Secondary | ICD-10-CM | POA: Diagnosis not present

## 2014-06-23 DIAGNOSIS — D631 Anemia in chronic kidney disease: Secondary | ICD-10-CM | POA: Diagnosis not present

## 2014-06-23 DIAGNOSIS — N039 Chronic nephritic syndrome with unspecified morphologic changes: Secondary | ICD-10-CM | POA: Diagnosis not present

## 2014-06-23 DIAGNOSIS — N183 Chronic kidney disease, stage 3 unspecified: Secondary | ICD-10-CM | POA: Diagnosis not present

## 2014-07-09 DIAGNOSIS — E559 Vitamin D deficiency, unspecified: Secondary | ICD-10-CM | POA: Diagnosis not present

## 2014-07-09 DIAGNOSIS — E039 Hypothyroidism, unspecified: Secondary | ICD-10-CM | POA: Insufficient documentation

## 2014-07-09 DIAGNOSIS — G2581 Restless legs syndrome: Secondary | ICD-10-CM | POA: Diagnosis not present

## 2014-07-09 DIAGNOSIS — N189 Chronic kidney disease, unspecified: Secondary | ICD-10-CM | POA: Diagnosis not present

## 2014-07-09 DIAGNOSIS — R209 Unspecified disturbances of skin sensation: Secondary | ICD-10-CM | POA: Diagnosis not present

## 2014-07-11 DIAGNOSIS — I6529 Occlusion and stenosis of unspecified carotid artery: Secondary | ICD-10-CM | POA: Diagnosis not present

## 2014-08-18 DIAGNOSIS — M542 Cervicalgia: Secondary | ICD-10-CM | POA: Diagnosis not present

## 2014-08-18 DIAGNOSIS — M4806 Spinal stenosis, lumbar region: Secondary | ICD-10-CM | POA: Diagnosis not present

## 2014-08-18 DIAGNOSIS — M5116 Intervertebral disc disorders with radiculopathy, lumbar region: Secondary | ICD-10-CM | POA: Diagnosis not present

## 2014-09-09 DIAGNOSIS — Z23 Encounter for immunization: Secondary | ICD-10-CM | POA: Diagnosis not present

## 2014-09-09 DIAGNOSIS — Z1389 Encounter for screening for other disorder: Secondary | ICD-10-CM | POA: Diagnosis not present

## 2014-09-09 DIAGNOSIS — R7989 Other specified abnormal findings of blood chemistry: Secondary | ICD-10-CM | POA: Diagnosis not present

## 2014-12-12 DIAGNOSIS — E039 Hypothyroidism, unspecified: Secondary | ICD-10-CM | POA: Diagnosis not present

## 2014-12-12 DIAGNOSIS — I10 Essential (primary) hypertension: Secondary | ICD-10-CM | POA: Diagnosis not present

## 2014-12-12 DIAGNOSIS — N2581 Secondary hyperparathyroidism of renal origin: Secondary | ICD-10-CM | POA: Diagnosis not present

## 2014-12-12 DIAGNOSIS — N183 Chronic kidney disease, stage 3 (moderate): Secondary | ICD-10-CM | POA: Diagnosis not present

## 2014-12-12 DIAGNOSIS — E785 Hyperlipidemia, unspecified: Secondary | ICD-10-CM | POA: Diagnosis not present

## 2014-12-12 LAB — BASIC METABOLIC PANEL
BUN: 36 mg/dL — AB (ref 4–21)
CREATININE: 2.4 mg/dL — AB (ref 0.5–1.1)
GLUCOSE: 92 mg/dL
POTASSIUM: 5.1 mmol/L (ref 3.4–5.3)
SODIUM: 140 mmol/L (ref 137–147)

## 2014-12-12 LAB — LIPID PANEL
Cholesterol: 181 mg/dL (ref 0–200)
HDL: 43 mg/dL (ref 35–70)
LDL CALC: 94 mg/dL
Triglycerides: 218 mg/dL — AB (ref 40–160)

## 2014-12-12 LAB — TSH: TSH: 3.02 u[IU]/mL (ref 0.41–5.90)

## 2014-12-29 DIAGNOSIS — I10 Essential (primary) hypertension: Secondary | ICD-10-CM | POA: Diagnosis not present

## 2014-12-29 DIAGNOSIS — D631 Anemia in chronic kidney disease: Secondary | ICD-10-CM | POA: Diagnosis not present

## 2014-12-29 DIAGNOSIS — N184 Chronic kidney disease, stage 4 (severe): Secondary | ICD-10-CM | POA: Diagnosis not present

## 2014-12-29 DIAGNOSIS — N2581 Secondary hyperparathyroidism of renal origin: Secondary | ICD-10-CM | POA: Diagnosis not present

## 2015-01-26 DIAGNOSIS — R809 Proteinuria, unspecified: Secondary | ICD-10-CM | POA: Diagnosis not present

## 2015-01-26 DIAGNOSIS — D631 Anemia in chronic kidney disease: Secondary | ICD-10-CM | POA: Diagnosis not present

## 2015-01-26 DIAGNOSIS — N183 Chronic kidney disease, stage 3 (moderate): Secondary | ICD-10-CM | POA: Diagnosis not present

## 2015-01-26 DIAGNOSIS — N2581 Secondary hyperparathyroidism of renal origin: Secondary | ICD-10-CM | POA: Diagnosis not present

## 2015-01-26 DIAGNOSIS — I10 Essential (primary) hypertension: Secondary | ICD-10-CM | POA: Diagnosis not present
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
## 2015-01-31 NOTE — Consult Note (Signed)
Chief Complaint:  Subjective/Chief Complaint Pt s/p left CEA now with elevated BP. Pt denies CP or SOB. Has been on Esmolol drip.   VITAL SIGNS/ANCILLARY NOTES: **Vital Signs.:   14-Jun-15 00:00  Pulse Pulse 74  Systolic BP Systolic BP 160  Diastolic BP (mmHg) Diastolic BP (mmHg) 96  Pulse Ox % Pulse Ox % 97  Oxygen Delivery 1L    00:30  Pulse Pulse 74  Systolic BP Systolic BP 109  Diastolic BP (mmHg) Diastolic BP (mmHg) 70  Pulse Ox % Pulse Ox % 96  Oxygen Delivery 1L    01:00  Pulse Pulse 71  Systolic BP Systolic BP 323  Diastolic BP (mmHg) Diastolic BP (mmHg) 87  Pulse Ox % Pulse Ox % 97  Oxygen Delivery 1L    02:00  Pulse Pulse 72  Systolic BP Systolic BP 73  Diastolic BP (mmHg) Diastolic BP (mmHg) 97  Pulse Ox % Pulse Ox % 97  Oxygen Delivery 1L    03:00  Pulse Pulse 69  Systolic BP Systolic BP 557  Diastolic BP (mmHg) Diastolic BP (mmHg) 63  Pulse Ox % Pulse Ox % 97  Oxygen Delivery 1L    04:00  Pulse Pulse 75  Systolic BP Systolic BP 322  Diastolic BP (mmHg) Diastolic BP (mmHg) 89  Pulse Ox % Pulse Ox % 91  Oxygen Delivery 1L    05:00  Pulse Pulse 75  Systolic BP Systolic BP 025  Diastolic BP (mmHg) Diastolic BP (mmHg) 96  Pulse Ox % Pulse Ox % 93  Oxygen Delivery 1L    06:00  Pulse Pulse 72  Systolic BP Systolic BP 427  Diastolic BP (mmHg) Diastolic BP (mmHg) 88  Pulse Ox % Pulse Ox % 96  Oxygen Delivery 1L    07:00  Pulse Pulse 74  Systolic BP Systolic BP 062  Diastolic BP (mmHg) Diastolic BP (mmHg) 91  Pulse Ox % Pulse Ox % 91  Oxygen Delivery Room Air/ 21 %    07:13  Pulse Pulse 74  Systolic BP Systolic BP 376  Diastolic BP (mmHg) Diastolic BP (mmHg) 283  Pulse Ox % Pulse Ox % 91  Oxygen Delivery Room Air/ 21 %    11:52  Pulse Pulse 75  Systolic BP Systolic BP 151  Diastolic BP (mmHg) Diastolic BP (mmHg) 71  Pulse Ox % Pulse Ox % 99  Oxygen Delivery 2L; Nasal Cannula   Brief Assessment:  GEN no acute distress   Cardiac Regular    Respiratory rhonchi   Gastrointestinal details normal Soft  Nontender   EXTR negative edema   Assessment/Plan:  Assessment/Plan:  Plan Will stop Esmolol drip. Continue Diovan, HCTZ, and Metoprolol. Add Norvasc. IV Hydralazine prn. Incentive spirometry. Wean O2. F/u labs in AM. Will follow with you.   Electronic Signatures: Idelle Crouch (MD)  (Signed 14-Jun-15 13:39)  Authored: Chief Complaint, VITAL SIGNS/ANCILLARY NOTES, Brief Assessment, Assessment/Plan   Last Updated: 14-Jun-15 13:39 by Idelle Crouch (MD)

## 2015-01-31 NOTE — Discharge Summary (Signed)
PATIENT NAME:  Carolyn Hendricks, HEARNS MR#:  [id number] DATE OF BIRTH:  [birth year]  DATE OF ADMISSION:  03/20/2014 DATE OF DISCHARGE:  03/24/2014  ADMITTING DIAGNOSES:  1. Carotid artery stenosis.  2. Reperfusion syndrome with headache and hypertension.  3. Chronic hypertension.   DISCHARGE DIAGNOSES:  1. Carotid artery stenosis.  2. Reperfusion syndrome with headache and hypertension.  3. Chronic hypertension.   PROCEDURES PERFORMED WHILE IN HOSPITAL: Left carotid endarterectomy on 03/20/2014. For full details of that, please see the dictated operative summary.   BRIEF HISTORY:  A 79 year old white female who is brought into the hospital for treatment of a high-grade left carotid artery stenosis.  She had chronic kidney disease that was known as a baseline. Hypertension and other medical comorbidities. This has now reached an appropriate degree of stenosis for prophylactic repair and she desired treatment.   HOSPITAL COURSE: The patient admitted to same-day surgery and taken to the operating room where a left carotid endarterectomy was performed. She did well following this; however, she had significant hypertension, requiring IV esmolol for several days postoperatively. Every time we attempted to wean this, her hypertension returned and she was having reperfusion headaches.  It was not until postoperative day #3 that she was able to come off the esmolol late in the evening. She did well overnight and remained normotensive. On postoperative day 4, she was stable for discharge. We discharged to home accompanied by her family. Her diet is regular. Her activities as tolerated.   HOME MEDICATIONS:  Will be metoprolol 25 mg b.i.d., aspirin 81 mg daily, Plavix 75 mg daily, meloxicam 50 mg daily, trazodone 50 mg at night, sertraline 50 mg at night, vitamin B12 at 100 mcg daily, Norco as needed for pain, Loratadine 10 mg daily, multivitamin daily, simvastatin 10 mg daily, Calcium Chewables twice daily,  fluticasone 50 mcg nasal spray daily, and hydrochlorothiazide valsartan 12.5/320 mg taken daily that she will hold if her blood pressure is less than 120 in the morning.   FOLLOWUP:  Her followup appointment will be in 2-4 weeks in our office. She is instructed to call or contact our office with fever greater than 101, wound redness or drainage, severe pain or other issues.   ____________________________ Algernon Huxley, MD jsd:dd D: 04/10/2014 17:14:08 ET T: 04/11/2014 03:33:13 ET JOB#: [id number]  cc: Algernon Huxley, MD, <Dictator> Algernon Huxley MD ELECTRONICALLY SIGNED 04/14/2014 15:05

## 2015-01-31 NOTE — Op Note (Signed)
PATIENT NAME:  Carolyn Hendricks, Carolyn Hendricks MR#:  [id number] DATE OF BIRTH:  1935-12-15  DATE OF PROCEDURE:  03/20/2014  PREOPERATIVE DIAGNOSES: 1.  High-grade left carotid artery stenosis with previous presyncopal episode.  2.  Chronic kidney disease.  3.  Hypertension.   POSTOPERATIVE DIAGNOSES:  1.  High-grade left carotid artery stenosis with previous presyncopal episode.  2.  Chronic kidney disease.  3.  Hypertension.   POSTOPERATIVE DIAGNOSIS: Left carotid endarterectomy.   SURGEON: Leotis Pain, MD  ASSISTANT: Melvyn Neth, PA-C  ANESTHESIA: General.   BLOOD LOSS: 50 mL.   INDICATION FOR PROCEDURE: This is a 79 year old white female with a long known history of carotid artery stenosis. This has progressed over the past several years. It now measures 85 to 90% on a recent angiogram. Due to the high risk for stroke, with this degree of stenosis, carotid endarterectomy was discussed and she desired to proceed. Risks and benefits were discussed and informed consent was obtained.   DESCRIPTION OF PROCEDURE: The patient was brought to the operative suite and after an adequate level of general anesthesia was obtained she was placed in modified beach chair position, her neck was flexed and turned to the right, and her skin was then sterilely prepped and draped and a sterile surgical field was created. After appropriate intravenous antibiotics and surgical timeout, an incision was created along the anterior border of the sternocleidomastoid. I dissected down through the platysma with electrocautery. The sternocleidomastoid was retracted laterally. The facial vein and several venous branches were ligated and divided between silk ties to facilitate our exposure. This allowed [place] access to the carotid artery. The common carotid artery, external carotid artery, superior thyroid artery and internal carotid artery distal to the lesion were all encircled with vessel loops. The patient was given 6000 units of  intravenous heparin for systemic anticoagulation. This was allowed to circulate for approximately 4 minutes before control was pulled up on the vessel loops. After control was obtained, an anterior wall arteriotomy was created with an 11 blade and extended with Potts scissors. The [place] shunt was then placed, first in the internal carotid artery, flushed and de-aired, and then in the common carotid artery. Approximate time elapsed from clamping to shunt placement was less than 2 minutes. I then performed an endarterectomy in the standard fashion. A proximal endpoint was cut flush with tenotomy scissors and an eversion endarterectomy was performed on the external carotid artery and a nice feathered distal endpoint was created with gentle traction on the internal carotid artery. The distal endpoint was tacked down with three 7-0 Prolene sutures. All loose flecks were removed and the vessel was locally heparinized. The patient had a generous-sized artery, particularly for a female, and one that was suitable for our primary closure. I started a 6-0 Prolene at the distal endpoint and ran this approximately one-half the length of the arteriotomy. I then started a second 6-0 Prolene at the proximal endpoint and run this approximately one-quarter the length of the arteriotomy. At this point, the shunt was removed. The vessel was flushed f range of motion internal, common, and external carotid arteries and locally heparinized. I then completed the suture line and flushed through the external carotid artery several cardiac cycles prior to release of control. On release there was good pulsatile flow through the internal carotid artery that was easily palpable. Two 6-0 Prolene patch sutures were used for hemostasis and hemostasis was achieved. The wound was then irrigated. Surgicel and Evicel topical hemostatic agents were  placed and hemostasis was complete. Three interrupted 3-0 Vicryl sutures were used to close the  sternocleidomastoid space and the platysma was closed with a running 3-0 Vicryl and the skin was closed with a 4-0 Monocryl. Sterile dressing was placed. The patient was awakened from anesthesia and taken to the recovery room in stable condition having tolerated the procedure well. ____________________________ Algernon Huxley, MD jsd:sb D: 03/20/2014 14:23:45 ET T: 03/20/2014 14:48:43 ET JOB#: [id number]  cc: Algernon Huxley, MD, <Dictator> Algernon Huxley MD ELECTRONICALLY SIGNED 03/21/2014 12:38

## 2015-01-31 NOTE — H&P (Signed)
PATIENT NAME:  Carolyn Hendricks, Carolyn Hendricks MR#:  [id number] DATE OF BIRTH:  03-Jul-1936  DATE OF ADMISSION:  02/17/2014  PRIMARY CARE PHYSICIAN: Kirstie Peri. Caryn Section, MD  REFERRING EMERGENCY ROOM PHYSICIAN: Conni Slipper, MD  CHIEF COMPLAINT: Dizziness and nausea.   HISTORY OF PRESENT ILLNESS: A 79 year old female who has past medical history of hypertension, hyperlipidemia, arthritis, MRSA in abdominal wound and some blockages in the carotid artery. For the last 2 weeks she was feeling somewhat dizzy and had nausea so she went to her primary care doctor, Dr. Maralyn Sago office. As the patient has history of carotid artery blockages 2 years ago and was suggested to have medical management after doing workup, Dr. Caryn Section suggested to get a repeat Doppler study of carotid arteries now and she came on 5th of May to have the studies done. After that, her problem continued. She had seen Dr. Leotis Pain, vascular surgeon, in the past and so she called his office but they were not aware about anything so daughter called her primary care doctor's office today and they actually had the result, as per the daughter, which shows the blockages were getting worse and because her complaints were not getting better she decided to bring her to the Emergency Room directly. When she came to the ER, ER physician spoke to vascular, Dr. Lucky Cowboy, and he suggested to give the patient better hydration because of her impaired kidney function and then get carotid CT scan and we will decide further workup for her carotid artery blockages later on so hospitalist service is contacted to admit the patient. On further questioning, the patient denies any numbness but has generalized weakness, more in both legs. She says that she has been feeling excessively dizzy, uneasy and nauseated but no numbness or no syncopal episode.   REVIEW OF SYSTEMS:    CONSTITUTIONAL: Negative for fever, fatigue. Positive for generalized weakness. No weight loss or weight gain.  EYES:  No blurring, double vision, discharge or redness.  EARS, NOSE, THROAT: No tinnitus, ear pain or hearing loss.  RESPIRATORY: No cough, wheezing, hemoptysis or shortness of breath.  CARDIOVASCULAR: No chest pain, orthopnea, edema, arrhythmia or palpitations.  GASTROINTESTINAL: The patient does not have any vomiting, diarrhea, abdominal pain but she had nausea.  GENITOURINARY: No dysuria, hematuria or increased frequency.  ENDOCRINE: No heat or cold intolerance. No increased sweating.  SKIN: No acne, rashes or lesions.  MUSCULOSKELETAL: No pain or swelling in the joints.  NEUROLOGICAL: The patient  denies any numbness, weakness, tremor or vertigo but had feeling of dizziness and nausea.  PSYCHIATRIC: No anxiety, insomnia, bipolar disorder.   PAST MEDICAL HISTORY:  Hypertension, hyperlipidemia, arthritis and MRSA wound infection.   SOCIAL HISTORY: She smoked 1 to 1-1/2 packs a day for 26 years but stopped smoking 2 years ago. No alcohol. No illicit drug use. She lives with her daughter and walks with a walker at home.   PAST SURGICAL HISTORY:  Appendectomy and bilateral knee replacement surgery and surgery.   FAMILY HISTORY: Positive for hypertension and heart disease in mother and kidney disease in father.   HOME MEDICATIONS: 1.  Vitamin B12 100 mg oral once a day.  2.  Simvastatin 10 mg oral once a day.  3.  Sertraline 50 mg oral tablet once a day.  4.  Percocet 10 mg oral every 6 hours as needed for pain.  5.  Metoprolol 25 mg oral tablet 2 times a day.  6.  Meloxicam 15 mg oral tablet once a  day  7.  Lorazepam 0.5 mg oral once a day as needed at bedtime.   8.  Hydrochlorothiazide plus valsartan 12.5/320 mg oral tablet once a day.  9.  Cardizem extended-release 120 mg 24-hour tablet, take once a day.  10.  Vitamin D plus calcium tablet 2 times a day.  11.  Aspirin 81 mg once a day.   LABORATORY, DIAGNOSTIC AND RADIOLOGICAL DATA:   1.  As per the carotid Doppler study which was done  on 5th of May, there is heavy atherosclerotic plaque in the carotid artery bilaterally, 50% to 69% stenosis on right which has increased in the interval from prior exam; on the left the stenosis is pretty occlusive and is also increased in interval from prior exam, further evaluation by means of CT angiogram or MRA is recommended. Thyroid heterogeneity and enlargement.  2.  Glucose 92, BUN 30, creatinine 1.29, sodium 137, potassium 4.6, chloride 106, CO2 is 33, calcium is 0.  3.  Total protein 7.8, albumin is 4, bilirubin 0.4, alkaline phosphate 58, SGOT 20 and SGPT 18.  4.  Troponin less than 0.02.  5.  WBC 6.6, hemoglobin is 13.9, platelet count is 169, MCV is 92.  6.  Urinalysis is grossly negative.   PHYSICAL EXAMINATION: VITAL SIGNS:  In ER, temperature 98.6, pulse rate is 59, respirations 20, blood pressure 148/71.  GENERAL: The patient is fully alert and oriented to time, place and person, and does not appear in any acute distress.  HEENT: Head and neck atraumatic. Conjunctiva pink. Oral mucosa moist.  NECK: Supple. No JVD. Carotid bruit heard, more on the left side.  CARDIOVASCULAR: S1, S2 present, regular. No murmur.  ABDOMEN: Soft, nontender. Bowel sounds present. No organomegaly.  SKIN: No rashes.  LEGS: No edema.  RESPIRATORY: Bilateral equal and clear air entry.  JOINTS: No swelling or tenderness.  CENTRAL NERVOUS SYSTEM: No tremor. No rigidity. Power 5 out of 5. Follows commands. Moves all 4 limbs.  PSYCHIATRIC: Does not appear in any acute psychiatric illness at this time.   ASSESSMENT AND PLAN: A 79 year old female with past medical history of hypertension, hyperlipidemia, has been feeling dizzy for the last 2 weeks and carotid Doppler study was done as outpatient which showed significant blockage on the right side, up to 70% blockage on the left side. Vascular consult is called in.  1.  Dizziness, most likely these are transient ischemic attack episodes secondary to her severe  blockages on the carotid artery system. Will admit her to hospital for further management. Emergency Room physician already spoke to Dr. Lucky Cowboy for vascular interventions on her carotid artery blockages and he suggested to hydrate her well and then get CT angiogram, further planning after that. Currently, the patient does not have any symptoms other than dizziness and nausea so I do not think to need getting MRI as we have a reason for her symptoms.  2.  Hypertension. Continue hydrochlorothiazide and Diovan. 3.  Hyperlipidemia. Continue statin.  4.  Chronic pains. Continue Percocet as she was taking.  5.  Chronic renal failure stage 3.  The patient's creatinine is 1.29. In her old records, I see up to 1.14 so we will give some hydration and prepare her kidney well for possible carotid angiogram as vascular interventions.  6.  CODE STATUS: Full code.   TOTAL TIME SPENT ON THIS ADMISSION: 50 minutes.     ____________________________ Ceasar Lund Anselm Jungling, MD vgv:cs D: 02/17/2014 17:01:01 ET T: 02/17/2014 18:09:14 ET JOB#: [id number]  cc: Ceasar Lund. Anselm Jungling, MD, <Dictator> Kirstie Peri. Caryn Section, MD Vaughan Basta MD ELECTRONICALLY SIGNED 03/04/2014 16:29

## 2015-01-31 NOTE — Consult Note (Signed)
PATIENT NAME:  Carolyn Hendricks, DEILY MR#:  [id number] DATE OF BIRTH:  Mar 06, 1936  DATE OF CONSULTATION:  03/23/2014  REFERRING PHYSICIAN:  Annice Needy, MD CONSULTING PHYSICIAN:  Duane Lope. Judithann Sheen, MD  REASON FOR CONSULTATION:  Accelerated hypertension.   HISTORY OF PRESENT ILLNESS: The patient is a 79 year old female with a history of hypertension, degenerative disk disease and peripheral vascular disease, who was admitted 3 days ago for elective left carotid endarterectomy. Since then, the patient has been in the Intensive Care Unit on an esmolol drip because of accelerated hypertension. She is asymptomatic. Denies chest pain or shortness of breath. Appetite is poor. Tolerating p.o. well without nausea, vomiting. Denies headache or blurred vision. No dizziness. No syncope or palpitations.   PAST MEDICAL HISTORY: 1.  Benign hypertension.  2.  Hyperlipidemia.  3.  Peripheral vascular disease.  4.  Degenerative disk disease.  5.  Osteopenia.  6.  Osteoarthritis.  7.  Anxiety/depression.  8.  Chronic insomnia.   MEDICATIONS ON ADMISSION:  1.  Norco 10/325 one p.o. q.6 hours p.r.n. pain.  2.  Aspirin 81 mg p.o. daily.  3.  Flonase 2 puffs in each nostril daily.  4.  Diovan/HCT 320/12.5 one p.o. daily.  5.  Claritin 10 mg p.o. daily.  6.  Meloxicam 15 mg p.o. daily.  7.  Lopressor 25 mg p.o. b.i.d.  8.  Sertraline 50 mg p.o. daily.  9.  Zocor 10 mg p.o. daily.  10.  Trazodone 50 mg p.o. at bedtime.   ALLERGIES: No known drug allergies.   SOCIAL HISTORY: The patient denies alcohol or tobacco abuse.   FAMILY HISTORY: Positive for hypertension, coronary artery disease, stroke.   REVIEW OF SYSTEMS:    CONSTITUTIONAL: No fever or change in weight.  EYES: No blurred or double vision. No glaucoma.  ENT: No tinnitus or hearing loss. No nasal discharge or bleeding. No difficulty swallowing.  RESPIRATORY: No cough or wheezing. Denies hemoptysis.  CARDIOVASCULAR: No chest pain or orthopnea. No  palpitations or syncope.  GASTROINTESTINAL: No nausea, vomiting or diarrhea. No abdominal pain.  GENITOURINARY: No dysuria or hematuria.  ENDOCRINE: No polyuria or polydipsia. No heat or cold intolerance.  HEMATOLOGIC: The patient denies anemia, easy bruising or bleeding.  LYMPHATIC: No swollen glands.  MUSCULOSKELETAL: The patient denies pain in her back or hips. Some neck pain from her surgery. No gout.  NEUROLOGIC: No numbness or migraines. Denies stroke or seizures.  PSYCHOLOGICAL: The patient denies anxiety, insomnia, depression.   PHYSICAL EXAMINATION: GENERAL: The patient is currently in no acute distress.  VITAL SIGNS: Currently remarkable for a blood pressure of 152/71, heart rate 75, respiratory rate 16. She is afebrile.  HEENT: Normocephalic, atraumatic. Pupils equal and reactive to light and accommodation. Extraocular movements are intact. Sclerae are anicteric. Conjunctivae are clear. Oropharynx clear.Marland Kitchen  NECK: Supple without JVD. No adenopathy or thyromegaly is noted.  LUNGS: Reveal basilar rhonchi without wheezes or rales. No dullness. Respiratory effort is normal.  CARDIAC: Regular rate and rhythm. Normal S1, S2. There is a 1/6 systolic murmur noted. No rubs or gallops are present. PMI is nondisplaced. Chest wall is nontender.  ABDOMEN: Soft, nontender, with normoactive bowel sounds. No organomegaly or masses were appreciated. No hernias or bruits were noted.  EXTREMITIES: Show trace edema, without clubbing or cyanosis. Pulses were 2+ bilaterally.  SKIN: Warm and dry without rash or lesions.  NEUROLOGIC: Cranial nerves II through XII grossly intact. Deep tendon reflexes were symmetric. Motor and sensory exam is nonfocal.  PSYCHIATRIC: Revealed a patient who is alert and oriented to person, place and time. She was cooperative and used good judgment.   ASSESSMENT: 1.  Accelerated hypertension.  2.  Peripheral vascular disease, status post left carotid endarterectomy.  3.   Hyperlipidemia.  4.  Chronic insomnia.  5.  Anxiety.  6.  Degenerative disk disease.  7.  Osteoarthritis.   PLAN: We will stop the esmolol drip. We will add Norvasc 5 mg p.o. daily to her oral regimen that she is taking at this time. We will also use hydralazine 10 mg every 4 hours as needed for systolic blood pressure greater than 180 or diastolic blood pressure greater than 100. Follow up routine labs in the morning.   Thank you for the consultation. Will continue to follow this patient with you while in the hospital. Please call if questions arise.   ____________________________ Duane Lope. Judithann Sheen, MD jds:cs D: 03/23/2014 13:33:12 ET T: 03/23/2014 14:27:40 ET JOB#: [id number]  cc: Duane Lope. Judithann Sheen, MD, <Dictator> Evagelia Knack Rodena Medin MD ELECTRONICALLY SIGNED 03/23/2014 16:10

## 2015-01-31 NOTE — Discharge Summary (Signed)
PATIENT NAME:  Carolyn Hendricks, Carolyn Hendricks MR#:  [id number] DATE OF BIRTH:  [birth year]  DATE OF ADMISSION:  02/20/2014  DATE OF DISCHARGE:  02/20/2014  PRIMARY CARE PHYSICIAN:  Dr. [name]  DISCHARGE DIAGNOSES: 1.  Dizziness and lightheadedness.  2.  Bradycardia.  3.  Carotid arterial stenosis.  4.  Chronic kidney disease. 5.  Hypertension.  6.  Tobacco abuse.   PROCEDURE: Cervical and cerebral carotid angiogram, showing 85% to 90% left ICA stenosis.   CONDITION: Stable.   CODE STATUS: FULL CODE.   HOME MEDICATIONS: Please refer to the medication reconciliation list.   DIET:  Low sodium, low fat, low cholesterol diet.   ACTIVITY: As tolerated.   FOLLOWUP CARE: Follow up with Dr. Rockey Situ within one week. Follow up with Dr. Lucky Cowboy within 1 to 2 weeks. Follow up with PCP within 1 to 2 weeks.   REASON FOR ADMISSION: Dizziness and nausea.   The patient is a 79 year old, Caucasian female with a history of hypertension, hyperlipidemia, presented to the ED with dizziness and nausea. The patient has a history of carotid arterial blockage 2 years ago, and was suggested to have medical management after workup. For detailed history and physical examination, please refer to the admission note dictated by Dr. Anselm Jungling.    LABS:  On admission date, the patient's laboratory data showed glucose 92, BUN 30, creatinine 1.29, sodium 137, potassium 4.6, otherwise negative. Troponin was less than 0.02. Urinalysis was negative.   HOSPITAL COURSE:  1.  Dizziness, most likely due to TIA secondary to her severe blockage of the carotid arterial. After admission, the patient's CAT scan was negative. The patient has been treated with aspirin and statin. Dr. Lucky Cowboy suggested cervical and cerebral carotid angiogram, which was done today. Angiogram showed 85% to 90% left ICA stenosis. According to Dr. Lucky Cowboy, the patient needs surgery as outpatient, but first needs cardiology clearance.   2.  Bradycardia, possibly due to  Cardizem/Lopressor, so Cardizem/Lopressor was on hold. The patient's bradycardia improved, so I resumed the Lopressor but held Cardizem.  3.  After angiogram today, patient and has mild headache, but denies any dizziness. Vital signs are stable.   Physical examination is unremarkable.   The patient is clinically stable, will be discharged to home today. I discussed the patient's discharge plan with the patient, patient's daughter, nurse, case manager also. Otherwise, the patient is to make an appointment with Dr. Rockey Situ for cardiology clearance before carotid arterial procedure.   TIME SPENT: About 42 minutes.   ____________________________ Demetrios Loll, MD qc:mr D: 02/20/2014 16:56:00 ET T: 02/20/2014 19:38:01 ET JOB#: [id number]  cc: Demetrios Loll, MD, <Dictator> Demetrios Loll MD ELECTRONICALLY SIGNED 02/21/2014 14:54

## 2015-01-31 NOTE — Op Note (Signed)
PATIENT NAME:  Carolyn Hendricks, Carolyn Hendricks MR#:  [id number] DATE OF BIRTH:  26-Aug-1936  DATE OF PROCEDURE:  02/20/2014  PREOPERATIVE DIAGNOSES:  1.  Carotid artery stenosis.  2.  Chronic kidney disease precluding CT angiogram.  3.  Dizziness and lightheadedness.  4.  Hypertension.  5.  Tobacco dependence.   POSTOPERATIVE DAIGNOSES: 1.  Carotid artery stenosis.  2.  Chronic kidney disease precluding CT angiogram.  3.  Dizziness and lightheadedness.  4.  Hypertension.  5.  Tobacco dependence.   PROCEDURE:  1.  Ultrasound guidance for vascular access to right femoral artery.  2.  Catheter placement to left common carotid artery from right femoral approach.  3.  Thoracic aortogram and cervical and cerebral left carotid angiogram.  4.  StarClose closure device, right femoral artery.   SURGEON: Leotis Pain, M.D.   ANESTHESIA: Local with moderate conscious sedation.   ESTIMATED BLOOD LOSS: Minimal.   FLUOROSCOPY TIME: Approximately 4 minutes.   CONTRAST USED: 45 mL.   INDICATION FOR PROCEDURE: A 79 year old female with carotid artery stenosis that has shown significant progression on a recent duplex ultrasound. She was known to have at least moderate carotid artery stenosis previously. Her chronic kidney disease precluded CT angiogram and a catheter-based angiogram was performed for further evaluation to determine if therapy is warranted and what will be the best type of intervention. The risks and benefits were discussed. Informed consent was obtained.   DESCRIPTION OF PROCEDURE: The patient is brought to the vascular suite. Groins were shaved and prepped and a sterile surgical field was created. Ultrasound was used to visualize a patent right femoral artery. This was accessed under direct ultrasound guidance without difficulty with Seldinger needle. A J-wire and 5-French sheath were placed and a permanent image was recorded. Intravenous heparin 3000 units were given for systemic anticoagulation. A  pigtail catheter was placed in the ascending aorta and an LAO projection thoracic aortogram was performed. This showed some calcific disease, but no high-grade stenosis at the origins of the great vessels. There was a normal arch configuration, although there was a very steep reverse curve, particularly in the left common carotid artery. I initially tried a JB2 catheter and a Bentson Hanafee catheter and I could get access to the proximal common carotid artery, but could never advance the catheter to get stable catheter position. I eventually had to settle on a Simmons 2 catheter to be able to get stable catheter position for our images. This was reconfigured off the aortic valve and easily cannulated the left common carotid artery and getting stable catheter access. Imaging was performed of the cervical and cerebral portions. Imaging demonstrated about an 85% to 90% ICA stenosis in the proximal 1 to 2 cm that was progressed from her lesion from a couple of years ago. Her intracerebral filling was somewhat pruned, but no focal defects were seen in the anterior and middle cerebral and both had filling. Three projections were performed of the cervical portion to confirm the high-grade stenosis and this was seen in all projections. I then removed the diagnostic catheter. Oblique arteriogram was performed of the right femoral artery and the StarClose closure device was deployed in the usual fashion with an excellent hemostatic result. The patient tolerated the procedure well and was taken to the recovery room in stable condition.   ____________________________ Algernon Huxley, MD jsd:aw D: 02/20/2014 09:10:39 ET T: 02/20/2014 09:31:03 ET JOB#: [id number]  cc: Algernon Huxley, MD, <Dictator> Kirstie Peri. Caryn Section, MD Algernon Huxley  MD ELECTRONICALLY SIGNED 02/20/2014 15:34

## 2015-02-01 NOTE — Consult Note (Signed)
Brief Consult Note: Diagnosis: rt perirectal abscess.   Patient was seen by consultant.   Consult note dictated.   Recommend further assessment or treatment.   Comments: appears adequately drained. cont IV abx and reexamine. if worsens can go to OR for I%D.  Electronic Signatures: Florene Glen (MD)  (Signed 26-May-13 15:47)  Authored: Brief Consult Note   Last Updated: 26-May-13 15:47 by Florene Glen (MD)

## 2015-02-01 NOTE — Consult Note (Signed)
General Aspect 79 year old Caucasian female with a history of hypertension, MRSA many years ago, arthritis, presented to the ED with worsening buttock pain, malaise, N/V, fever. Cardiology was consulted for tachycardia.  On arrival, She had atrial tachycardia with rate 144 bpm. She received diltiazem po with improvement of her rate. Tele strips showed rate to 150 bpm.  On arrival to the tele floor, rate had improved. She did have I&D of buttock abscess in teh ER with significant pain. Started in IVF for elevated creatinine/BUN and ABX. Seen by surgery with recs for continued ABX and monitoring for now.   She reports abscess has been coming on for two weeks. She has also had periods of tachycardia over the past few months, sometimes up to 2 to 3 hours. Sx come on at rest and stress. tachycardia always goes away on its own. When she has the tachycardia, she has SOB and chest tighthess.   The patient is alert, awake, oriented, in no acute distress. The patient complains of headache, generalized weakness. In addition, the patient has palpitations, cough, mild shortness of breath. The patient said she developed the  fever, chills and dizziness about two days ago and had nausea and vomited once yesterday.    PAST MEDICAL HISTORY:  1. Hypertension. 2. Arthritis. 3. History of MRSA in abdominal wound which healed.  SOCIAL HISTORY: She smokes 1-1/2 packs a day for 26 years. She denies any smoking, alcohol drinking, or illicit drugs.   PAST SURGICAL HISTORY:  1. Appendectomy.  2. Bilateral knee replacements.   FAMILY HISTORY: Mother had hypertension and heart disease. Father had kidney disease.   ALLERGIES: No known drug allergies.   Physical Exam:   GEN well developed, well nourished, no acute distress    HEENT red conjunctivae    NECK supple     RESP normal resp effort  wheezing  rhonchi     CARD Regular rate and rhythm  Normal, S1, S2  No murmur     ABD denies tenderness  soft      LYMPH negative neck    EXTR negative edema    SKIN normal to palpation    NEURO motor/sensory function intact    PSYCH alert, A+O to time, place, person, good insight   Review of Systems:   Subjective/Chief Complaint malaise, buttock pain,  chest pain resolved    General: Weakness     Skin: No Complaints     ENT: No Complaints  left carotid bruit     Eyes: No Complaints     Neck: No Complaints     Respiratory: Short of breath  Wheezing     Cardiovascular: Chest pain or discomfort  Palpitations     Gastrointestinal: No Complaints     Genitourinary: No Complaints     Vascular: No Complaints     Musculoskeletal: No Complaints     Neurologic: No Complaints     Hematologic: No Complaints     Endocrine: No Complaints     Psychiatric: No Complaints     Review of Systems: All other systems were reviewed and found to be negative     Medications/Allergies Reviewed Medications/Allergies reviewed           Admit Diagnosis:   ABSCESS OF CELLULITIS OF BUTTOCK: 04-Mar-2012, Active, ABSCESS OF CELLULITIS OF BUTTOCK      Admit Reason:   Abscess of cellulitis of buttock: (682.5) Active, ICD9, Cellulitis and abscess of buttock  Home Medications:  Medication Instructions Status  zolpidem  10 mg oral tablet 1 tab(s) orally once a day (at bedtime) Active  amlodipine 10 mg oral tablet 1 tab(s) orally once a day Active  levocetirizine 5 mg oral tablet 1 tab(s) orally once a day (in the evening) Active  Diovan 12.5 milligram(s) orally once a day Active  metoprolol tartrate 25 mg oral tablet tab(s) orally once a day Active  Vitamin B-12 1  orally once a day Active     Routine Chem:  26-May-13 04:22    Glucose, Serum 88   BUN 30   Creatinine (comp) 1.55   Sodium, Serum 139   Potassium, Serum 3.9   Chloride, Serum 103   CO2, Serum 26   Calcium (Total), Serum 8.3   Osmolality (calc) 283   eGFR (African American) 38   eGFR (Non-African American) 33   Anion Gap 10   Routine Hem:  26-May-13 04:22    WBC (CBC) 17.9   RBC (CBC) 3.80   Hemoglobin (CBC) 12.0   Hematocrit (CBC) 35.7   Platelet Count (CBC) 171   MCV 94   MCH 31.5   MCHC 33.5   RDW 14.0   Neutrophil % 86.6   Lymphocyte % 6.4   Monocyte % 6.2   Eosinophil % 0.6   Basophil % 0.2   Neutrophil # 15.5   Lymphocyte # 1.1   Monocyte # 1.1   Eosinophil # 0.1   Basophil # 0.0  Thyroid:  26-May-13 04:22    Thyroid Stimulating Hormone 1.55  Cardiac:  26-May-13 12:29    CPK-MB, Serum 1.2   Troponin I < 0.02   EKG:   Interpretation EKG shows atrial tachycardia with rate 144 bpm, poor R wave progression through the precordial leads   Radiology Results:  XRay:    25-May-13 13:09, Chest PA and Lateral   Chest PA and Lateral    REASON FOR EXAM:    cough  COMMENTS:       PROCEDURE: DXR - DXR CHEST PA (OR AP) AND LATERAL  - Mar 03 2012  1:09PM     RESULT: The lungs are clear. The cardiac silhouette and visualized bony   skeleton are unremarkable.    IMPRESSION:      1. Chest radiograph without evidence of acute cardiopulmonary disease.          Verified By: Mikki Santee, M.D., MD  Cardiology:    26-May-13 13:29, Echo Doppler   Echo Doppler    Interpretation Summary    Essentially a normal study. Left ventricular systolic function is   normal. Ejection Fraction = >55%. The transmitral spectral Doppler   flow pattern is suggestive of impaired LV relaxation. The right   ventricular systolic function is normal. The left atrial size is   normal. Right ventricular systolic pressure is normal.    PatientHeight: 157 cm    PatientWeight: 79 kg    BSA: 1.8 m2    Procedure:    A two-dimensional transthoracic echocardiogram with color flow and   Doppler was performed.    The study was completed  bedside.    TDS due to body habitus.    Left Ventricle    The left ventricular wall motion is normal.    Left ventricular systolic function is normal.    Ejection Fraction =  >55%.    The transmitralspectral Doppler flow pattern is suggestive of   impaired LV relaxation.    The left ventricle is normal in size.    There is no  thrombus.    There is normal left ventricular wall thickness.    Right Ventricle    The right ventricle is normal size.   The right ventricular systolic function is normal.    Atria    The left atrial size is normal.    Right atrial size is normal.    There is no Doppler evidence for an atrial septal defect.    Mitral Valve    The mitral valve leaflets appear normal. There is no evidence of   stenosis, fluttering, or prolapse.    There is no mitral valve stenosis.    There is trace mitral regurgitation.    Tricuspid Valve    The tricuspid valve is normal.    There is trace tricuspid regurgitation.    Right ventricular systolic pressure is normal.    Aortic Valve    The aortic valve opens well.    No hemodynamically significant valvular aortic stenosis.    No aortic regurgitation is present.    Pulmonic Valve    The pulmonic valve leaflets are thin and pliable; valve motion is   normal.    There is no pulmonic valvular regurgitation.    Great Vessels    The aortic root is normal size.    The pulmonary artery is not well visualized, but is probably normal   size.    Pericardium/Pleural    There is no pleural effusion.    No pericardial effusion.    MMode 2D Measurements and Calculations    IVSd: 1.2 cm    LVIDd: 4.8 cm    LVIDs: 3.0 cm    LVPWd: 1.2 cm    FS: 37 %    EF(Teich): 67 %    Ao root diam: 2.9 cm    ACS: 1.5 cm    LA dimension: 3.4 cm    LVOT diam: 1.9 cm    Doppler Measurements and Calculations    MV E point: 88 cm/sec    MV A point: 124 cm/sec    MV E/A: 0.71     MV P1/2t max vel: 89 cm/sec    MV P1/2t: 81 msec    MVA(P1/2t): 2.7 cm2    MV dec slope: 321 cm/sec2    MV dec time: 0.32 sec  Ao V2 max: 220 cm/sec    Ao max PG: 19 mmHg    Ao V2 mean: 134 cm/sec    Ao mean PG: 8.5 mmHg    Ao  V2 VTI: 38 cm    AVA(I,D): 1.8 cm2    AVA(V,D): 1.6 cm2    LV max PG: 6.0 mmHg    LV mean PG: 2.6 mmHg    LV V1 max: 127 cm/sec    LV V1 mean: 72 cm/sec    LV V1 VTI: 24 cm    SV(LVOT): 67 ml    PA V2 max: 96 cm/sec    PA max PG: 4.0 mmHg    TR Max vel: 247 cm/sec    TR Max PG: 24 mmHg    RVSP: 29 mmHg    RAP systole: 5.0 mmHg    Reading Physician: Ida Rogue   Sonographer: Jaci Standard  Interpreting Physician:  Ida Rogue,  electronically signed on   03-04-2012 15:29:04  Requesting Physician: Ida Rogue    No Known Allergies:   Vital Signs/Nurse's Notes:  **Vital Signs.:   26-May-13 12:46   Vital Signs Type Upon Transfer;    Temperature Temperature (F) 98.6   Celsius 37  Temperature Source oral   Pulse Pulse 142   Pulse source per Dinamap   Respirations Respirations 22   Systolic BP Systolic BP 909   Diastolic BP (mmHg) Diastolic BP (mmHg) 73   Mean BP 87   BP Source Dinamap   Pulse Ox % Pulse Ox % 91   Pulse Ox Activity Level  At rest   Oxygen Delivery Room Air/ 21 %   Telemetry pattern Cardiac Rhythm Sinus tachycardia; pattern reported by Telemetry Clerk     Impression 79 year old Caucasian female with a history of hypertension, MRSA many years ago, arthritis, presented to the ED with worsening buttock pain, malaise, N/V, fever. Cardiology was consulted for tachycardia.  1) Tachycardia/arrhythmia Symptoms over the past few months at least, frequently associated with SOB and chest tightness Tele strips suggest atrial tach. Less than 10 epsiodes total. Logest at home was 2 to 3 hours. --Would continue diltiazem 30 mg q6, incvrease metoprolol to 25 mg BID Monitor on tele. --Hold amlodipine  2) Chest tightness/SOB High risk of CAD given long hx of smoking Needs stress test (lexiscan) Could be done this admission or as an outpt Cardiac enz negative ASA 81 mg daily, b-blocker. Check cholesterol, statin  3)Left carotid bruit Will order carotid  ulterasound at her request. 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

## 2015-02-01 NOTE — H&P (Signed)
PATIENT NAME:  Carolyn Hendricks, Carolyn Hendricks MR#:  [id number] DATE OF BIRTH:  September 30, 1936  DATE OF ADMISSION:  03/03/2012  REFERRING PHYSICIAN:   Lenise Arena, MD  PRIMARY CARE PHYSICIAN: Kirstie Peri. Fisher, MD   CHIEF COMPLAINT: Multiple complaints including fever, chills, dizziness, headache, weakness and chest pain for two weeks.   HISTORY OF PRESENT ILLNESS: The patient is a 79 year old Caucasian female with a history of hypertension, MRSA many years ago, arthritis, presented to the ED with multiple complaints. The patient is alert, awake, oriented, in no acute distress. The patient complains of headache, generalized weakness, chest pain for two weeks. In addition, the patient has palpitations, cough, mild shortness of breath. The patient said she developed the  fever, chills and dizziness about two days ago and had nausea and vomited once today. The patient had buttock pain, which was found to have cellulitis and abscess, and got an I and D in the ED. She was noted to have leukocytosis and was treated with clindamycin in the ED.   PAST MEDICAL HISTORY:  1. Hypertension. 2. Arthritis. 3. History of MRSA in abdominal wound which healed.  SOCIAL HISTORY: She smokes 1-1/2 packs a day for 26 years. She denies any smoking, alcohol drinking, or illicit drugs.   PAST SURGICAL HISTORY:  1. Appendectomy.  2. Bilateral knee replacements.   FAMILY HISTORY: Mother had hypertension and heart disease. Father had kidney disease.   ALLERGIES: No known drug allergies.   MEDICATIONS: Norvasc 1 tablet p.o. daily. The patient cannot remember the dose, and also hydrocodone 1 tablet every 6 hours p.r.n. REVIEW OF SYSTEMS: CONSTITUTIONAL: The patient has a fever, chills, headache, dizziness, and the generalized weakness. No weight loss. EYES: No double vision, blurred vision. No cataract. ENT: No epistaxis, postnasal drip, dysphagia, or slurred speech.  RESPIRATORY: Positive for cough and shortness of breath. No hemoptysis  or wheezing. CARDIOVASCULAR: Positive for chest pain which is chronic and also palpitations, but no orthopnea, nocturnal dyspnea or leg edema. GASTROINTESTINAL: Positive for nausea, vomiting, and diarrhea once. No abdominal pain, melena or bloody stool. GENITOURINARY: No dysuria, hematuria, or incontinence. HEMATOLOGIC: No easy bruising or bleeding. NEUROLOGICAL: No syncope, loss of consciousness or seizure. SKIN: No rash or jaundice but has an abscess on the right buttock.   PHYSICAL EXAMINATION:  VITAL SIGNS: Temperature is 101, blood pressure 160/61, pulse 116, respirations 30, oxygen saturation 94%.   GENERAL: The patient is alert, awake, oriented, in no acute distress, status post I and D.   HEENT: Pupils are round, equal, reactive to light and accommodation. Dry oral mucosa. Clear oropharynx.   NECK: Supple. No JVD or carotid bruit. No lymphadenopathy. No thyromegaly.   CARDIOVASCULAR: S1, S2 regular rate and rhythm. No murmurs or gallops.   PULMONARY: Bilateral air entry. No wheezing or rales.   ABDOMEN: Soft. No distention or tenderness. No organomegaly. Bowel sounds are present.   EXTREMITIES: No edema, clubbing, or cyanosis. No calf tenderness. Strong bilateral pedal pulses.   SKIN: There is bleeding from dressing status post right buttock I and D.   NEUROLOGY: Alert and oriented x3. No focal deficit. Power five out of five. Sensation intact. Deep tendon reflexes 2+.   LABORATORY, DIAGNOSTIC AND RADIOLOGICAL DATA:  Urinalysis negative.   Chest x-ray: No acute cardiopulmonary disease.  CK 75, CK-MB 0.9.  Glucose 85, BUN 26, creatinine 1.39.  WBC 20.7, hemoglobin 13.5, platelets 187.  Troponin less than 0.02.  EKG shows sinus tachycardia at 103 beats per minute with possible left atrial  enlargement.   IMPRESSION:  1. Systemic inflammatory response syndrome, need to rule out sepsis.  2. Buttock abscess and cellulitis.  3. Acute renal failure and dehydration.   4. Leukocytosis.  5. Tachycardia.  6. Hypertension, uncontrolled.  7. Tobacco use.  8. History of MRSA.   PLAN OF TREATMENT:  1. The patient will be admitted to a Medical floor. We will continue telemetry monitor.  2. We will start clindamycin IVPB, and we will get wound care, and follow-up CBC, blood culture and wound culture.  3. For dehydration, we will give IV fluid support and follow-up BMP.  4. For hypertension, we will continue Norvasc.  5. Smoking cessation was counseled.  6. GI and deep vein thrombosis prophylaxis.   I discussed the patient's situation and plan of treatment with the patient and the patient's daughter.   TIME SPENT: About 65 minutes.  ____________________________ Demetrios Loll, MD qc:cbb D: 03/03/2012 15:09:05 ET T: 03/03/2012 15:59:12 ET JOB#: [id number]  cc: Demetrios Loll, MD, <Dictator> Kirstie Peri. Caryn Section, MD Demetrios Loll MD ELECTRONICALLY SIGNED 03/04/2012 12:09

## 2015-02-01 NOTE — Op Note (Signed)
PATIENT NAME:  Carolyn Hendricks, Carolyn Hendricks MR#:  [id number] DATE OF BIRTH:  09/28/1936  DATE OF PROCEDURE:  03/19/2012  PREOPERATIVE DIAGNOSES:  1. Carotid artery stenosis with presyncope and dizziness.  2. Chronic renal insufficiency precluding CT angiogram.  3. Hypertension.   POSTOPERATIVE DIAGNOSES:  1. Carotid artery stenosis with presyncope and dizziness.  2. Chronic renal insufficiency precluding CT angiogram.  3. Hypertension.   PROCEDURES:  1. Catheter placement into left common carotid artery from right femoral approach.  2. Thoracic aortogram and left cervical and cerebral carotid arteriogram.  3. StarClose closure device, right femoral artery.   SURGEON: Algernon Huxley, MD  ANESTHESIA: Local with moderate conscious sedation.   ESTIMATED BLOOD LOSS: Minimal.   INDICATION FOR PROCEDURE: This is a 79 year old white female with stage II-III chronic kidney disease who had an episode of presyncope and dizziness and had an ultrasound performed which suggested a high-grade left carotid artery stenosis. CT angiogram was not able to be performed due to her renal issues and so catheter-based angiogram is planned. Risks and benefits were discussed. Informed consent was obtained.   DESCRIPTION OF PROCEDURE: Patient is brought to the vascular interventional radiology suite. Groins shaved and prepped, sterile surgical field was created. The right femoral head was localized with fluoroscopy and the right femoral artery was accessed with mild amount of difficulty with a Seldinger needle. A J-wire and 5 French sheath was placed. Patient was given 2500 units of intravenous heparin for systemic anticoagulation and a pigtail catheter was placed in the ascending aorta and LAO projection thoracic aortogram was performed. This demonstrated what appeared to be an approximately 50% stenosis of the origin of the left common carotid artery, a calcified lesion in the right innominate artery that did not appear flow  limiting, a patent left subclavian artery proximally. There was a severe reverse curve of the arch and a JB2 and a Bentson-Hanafee catheter were unsuccessful in cannulating the left common carotid artery and gaining stable catheter position. I used a Simmons II catheter reformed this in a typical fashion and was able to easily cannulate the left common carotid artery at this point. Cervical and cerebral portions were performed intracerebrally. There was good cross filling with no obvious deficits in the anterior and middle cerebral arteries. In the cervical portion several different oblique angles were performed and on magnified views this measured out to be right at a 75% stenosis of the left internal carotid artery just below its origin. At this point, I elected to terminate the procedure. The diagnostic catheter was removed. Oblique arteriogram was performed of the right femoral artery and StarClose closure device deployed in the usual fashion with excellent hemostatic result. The patient tolerated procedure well and was taken to recovery room in stable condition.    ____________________________ Algernon Huxley, MD jsd:cms D: 03/19/2012 10:33:58 ET T: 03/19/2012 12:55:54 ET JOB#: [id number]  cc: Algernon Huxley, MD, <Dictator>  Algernon Huxley MD ELECTRONICALLY SIGNED 03/21/2012 12:28

## 2015-02-01 NOTE — Consult Note (Signed)
PATIENT NAME:  Carolyn Hendricks, SELKE MR#:  [id number] DATE OF BIRTH:  December 05, 1935  DATE OF CONSULTATION:  03/04/2012  REFERRING PHYSICIAN:   CONSULTING PHYSICIAN:  Jerrol Banana. Burt Knack, MD  CHIEF COMPLAINT: Perirectal abscess.   HISTORY OF PRESENT ILLNESS: This is a patient with a seven day history at least of perirectal pain. It is unclear to me but there could have been some spontaneous drainage as well. She denies fevers or chills. She was having considerable pain and this was drained in the Emergency Room last night. She is not sure how much was removed.   Also of note, she was having multiple other medical complaints including chest pain and was admitted for medical work-up.   PAST MEDICAL HISTORY:  1. Hypertension. 2. Arthritis. 3. History of MRSA.  4. She has had a prior abdominal wound.   FAMILY HISTORY: Noncontributory.   REVIEW OF SYSTEMS: 10 system review is performed and negative with the exception of that mentioned in the history of present illness.   PHYSICAL EXAMINATION:   GENERAL: Comfortable appearing Caucasian female patient.   VITAL SIGNS: Temperature 98.6, pulse 142, respirations 22, blood pressure 116/73, 91% room air sats.   HEENT: No scleral icterus.   NECK: No palpable neck nodes.   ABDOMEN: Soft and nontender.  RECTAL: Inspection of the perirectal area demonstrates erythema and a draining wound with minimal expressible purulence in the right buttock. Induration is absent and there is minimal if any tenderness.   EXTREMITIES: Minimal edema.   NEUROLOGIC: Grossly intact.   INTEGUMENTARY: No jaundice.   LABORATORY, DIAGNOSTIC, AND RADIOLOGICAL DATA: Electrolytes are within normal limits with the exception of creatinine of 1.39. White blood cell count was 20.7 on admission with an hemoglobin and hematocrit of 13.5 and 41. Repeat white blood cell count is 17.9 with an hemoglobin and hematocrit of 12 and 36 and a platelet count of 171. Creatinine is improved to 1.55.    ASSESSMENT AND PLAN: This is a patient who underwent an incision and drainage of a longstanding perirectal abscess last night in the Emergency Room. It appears to be adequately drained. Induration is absent and there is still erythema but minimal to no expressible purulence.   My plan would be to re-examine this patient and continue observation and IV antibiotics. If she were to worsen or show signs of a repeat or recurrent abscess, then a formal drainage in the operating room could be performed. Continued medical work-up for her chest pain is ongoing but at this point I see no surgical needs as this appears to be adequately drained. I will be happy to follow the patient while she is in the hospital until resolution.   ____________________________ Jerrol Banana Burt Knack, MD rec:drc D: 03/04/2012 16:04:35 ET T: 03/04/2012 16:21:08 ET JOB#: [id number]  cc: Jerrol Banana. Burt Knack, MD, <Dictator>  Florene Glen MD ELECTRONICALLY SIGNED 03/05/2012 8:48

## 2015-02-01 NOTE — Discharge Summary (Signed)
PATIENT NAME:  Carolyn Hendricks, WARDROP MR#:  [id number] DATE OF BIRTH:  1936-08-23  DATE OF ADMISSION:  03/03/2012 DATE OF DISCHARGE:  03/06/2012  ADMITTING DIAGNOSES: Fevers, chills, dizziness, chest pain.   DISCHARGE DIAGNOSES:  1. Fevers and chills due to a right perirectal abscess and cellulitis status post incision and drainage.  2. Chest pain, atypical in nature. Negative cardiac enzymes. Status post cardiology evaluation. Will follow up with [facility] as an outpatient for outpatient stress test.  3. Left-sided carotid bruit with carotid Doppler showing 75% to 95% left internal carotid stenosis.   4. Hypertension.  5. Paroxysmal atrial tachycardia, heart rate improved with increase in her beta blocker and Cardizem.  6. Osteoarthritis.  7. History of methicillin-resistant Staphylococcus aureus in abdominal wound.   LABORATORY, DIAGNOSTIC AND RADIOLOGICAL DATA: Urinalysis was negative. Chest x-ray showed no acute cardiopulmonary processes. CPK 75, CK-MB 0.9, troponin less than 0.02. Glucose 85, BUN 26, creatinine 1.39, WBC 20.7, hemoglobin 13.5, platelet count 187. EKG showed sinus tachycardia with possible left atrial enlargement. Most recent WBC count on 05/17 is 14.8, creatinine 1.39. Echocardiogram of the heart showed left ventricular systolic function normal, ejection fraction greater than 55%, some impaired left ventricular relaxation, left atrial size is normal. Carotid Doppler showed proximal left internal carotid stenosis of 75% to 95%.   CONSULTANTS:  1. Dr. Excell Seltzer. 2. Dr. Mariah Milling.   HOSPITAL COURSE: Please refer to history and physical done by the admitting physician. Patient is a 79 year old white female presented with multiple complaints. Was noted to have leukocytosis, fever in the Emergency Department. On further evaluation she was noted to have a right perirectal abscess and cellulitis. Patient had incision and drainage done in the ED and she was admitted for antibiotics. Would  cultures did grow MRSA sensitive to clindamycin. Her WBC count has trended downwards. She was seen by Dr. Excell Seltzer. He did not feel that she needed further drainage of the abscess. Patient also was complaining of chest pain and palpitations. Initially during the hospitalization she was noted to have heart rate in the 160s-170s, was transferred to telemetry. Cardiology evaluation with Dr. Mariah Milling was done. He recommended increasing metoprolol as well as placing her on Cardizem. Patient's heart rate has been stable with this regimen. She was in sinus tachycardia. In terms of the chest pain she will be seen as an outpatient for possible stress test. Patient also had a carotid bruit on the left side, underwent carotid Doppler's which showed carotid stenosis. She is recommended to be on aspirin on daily basis as well as she will follow up with vascular surgery for any further intervention. At this time she is stable for discharge.   DISCHARGE MEDICATIONS:  1. Ambien 10 once daily. 2. Metoprolol 25 mg b.i.d.  3. Levocetirizine 5 mg daily.  4. Diovan 12.5 mg daily.  5. Vitamin B12 1 tab p.o. daily.  6. Clindamycin 600 p.o. q.8 x6 days. 7. Aspirin 81, 1 tabs p.o. daily. 8.  Cardizem CD 120 mg p.o. daily.   HOME OXYGEN: None.   DIET: Low sodium.   ACTIVITY: As tolerated.  TIMEFRAME FOR FOLLOW UP: Follow up with Dr. Sherrie Mustache, primary M.D., at the end of the week to look at her buttocks region. Follow up with Dr. Wyn Quaker as a new patient for left-sided carotid stenosis.   TIME SPENT: 35 minutes.  ____________________________ Lacie Scotts Allena Katz, MD shp:cms D: 03/06/2012 10:37:13 ET T: 03/06/2012 12:15:01 ET  JOB#: [id number] cc: Itha Kroeker H. Allena Katz, MD, <Dictator> Demetrios Isaacs. Sherrie Mustache, MD  Charise Carwin MD ELECTRONICALLY SIGNED 03/08/2012 13:37

## 2015-02-02 DIAGNOSIS — D631 Anemia in chronic kidney disease: Secondary | ICD-10-CM | POA: Diagnosis not present

## 2015-02-02 DIAGNOSIS — N2581 Secondary hyperparathyroidism of renal origin: Secondary | ICD-10-CM | POA: Diagnosis not present

## 2015-02-02 DIAGNOSIS — I10 Essential (primary) hypertension: Secondary | ICD-10-CM | POA: Diagnosis not present

## 2015-02-02 DIAGNOSIS — N184 Chronic kidney disease, stage 4 (severe): Secondary | ICD-10-CM | POA: Diagnosis not present

## 2015-02-06 DIAGNOSIS — M25512 Pain in left shoulder: Secondary | ICD-10-CM | POA: Diagnosis not present

## 2015-02-06 DIAGNOSIS — M150 Primary generalized (osteo)arthritis: Secondary | ICD-10-CM | POA: Diagnosis not present

## 2015-02-06 DIAGNOSIS — G8929 Other chronic pain: Secondary | ICD-10-CM | POA: Diagnosis not present

## 2015-02-06 DIAGNOSIS — M25511 Pain in right shoulder: Secondary | ICD-10-CM | POA: Diagnosis not present

## 2015-02-17 DIAGNOSIS — M5416 Radiculopathy, lumbar region: Secondary | ICD-10-CM | POA: Diagnosis not present

## 2015-02-17 DIAGNOSIS — M4806 Spinal stenosis, lumbar region: Secondary | ICD-10-CM | POA: Diagnosis not present

## 2015-02-27 DIAGNOSIS — M25511 Pain in right shoulder: Secondary | ICD-10-CM | POA: Diagnosis not present

## 2015-02-27 DIAGNOSIS — G8929 Other chronic pain: Secondary | ICD-10-CM | POA: Diagnosis not present

## 2015-02-27 DIAGNOSIS — M25512 Pain in left shoulder: Secondary | ICD-10-CM | POA: Diagnosis not present

## 2015-03-12 ENCOUNTER — Telehealth: Payer: Self-pay | Admitting: Family Medicine

## 2015-03-12 NOTE — Telephone Encounter (Signed)
Pt contacted office for refill request on the following medications: Hydrocodone 10-325 mg. Thanks TNP

## 2015-04-09 ENCOUNTER — Other Ambulatory Visit: Payer: Self-pay | Admitting: Family Medicine

## 2015-04-09 DIAGNOSIS — M2550 Pain in unspecified joint: Secondary | ICD-10-CM

## 2015-04-09 MED ORDER — HYDROCODONE-ACETAMINOPHEN 10-325 MG PO TABS: 1.0000 | ORAL_TABLET | ORAL | Status: DC | PRN

## 2015-04-09 NOTE — Telephone Encounter (Signed)
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 Needs refill on Hydrocodone-Acetaminophen 10-351m 1 every 4 hours as needed #180. Last refill was 03/12/2015. Patient will run out of medication over the weekend.

## 2015-04-09 NOTE — Telephone Encounter (Signed)
Daughter call for refill to be picked up. HYDROcodone-acetaminophen (VICODIN) 5-500 MG

## 2015-04-09 NOTE — Telephone Encounter (Signed)
Medication refill approved by Dr. Venia Minks and signed. Patient daughter Hilda Blades advised.

## 2015-04-15 ENCOUNTER — Other Ambulatory Visit: Payer: Self-pay | Admitting: *Deleted

## 2015-04-15 MED ORDER — METOPROLOL TARTRATE 25 MG PO TABS: 25.0000 mg | ORAL_TABLET | Freq: Two times a day (BID) | ORAL | Status: DC

## 2015-04-15 NOTE — Telephone Encounter (Signed)
Refill request for Metoprolol Tartrate 25 mg Last filled by MD on- 10/13/2014 #60x5 Last Appt: 12/12/2014 Next Appt: 04/17/2015 Please advise refill?

## 2015-04-17 ENCOUNTER — Other Ambulatory Visit: Payer: Self-pay | Admitting: *Deleted

## 2015-04-17 ENCOUNTER — Encounter: Payer: Self-pay | Admitting: Family Medicine

## 2015-04-17 ENCOUNTER — Ambulatory Visit (INDEPENDENT_AMBULATORY_CARE_PROVIDER_SITE_OTHER): Payer: Medicare Other | Admitting: Family Medicine

## 2015-04-17 VITALS — BP 130/76 | HR 68 | Temp 98.7°F | Resp 16 | Wt 189.0 lb

## 2015-04-17 DIAGNOSIS — E785 Hyperlipidemia, unspecified: Secondary | ICD-10-CM

## 2015-04-17 DIAGNOSIS — M25549 Pain in joints of unspecified hand: Secondary | ICD-10-CM | POA: Insufficient documentation

## 2015-04-17 DIAGNOSIS — R195 Other fecal abnormalities: Secondary | ICD-10-CM | POA: Insufficient documentation

## 2015-04-17 DIAGNOSIS — E038 Other specified hypothyroidism: Secondary | ICD-10-CM

## 2015-04-17 DIAGNOSIS — R200 Anesthesia of skin: Secondary | ICD-10-CM | POA: Insufficient documentation

## 2015-04-17 DIAGNOSIS — M2550 Pain in unspecified joint: Secondary | ICD-10-CM | POA: Insufficient documentation

## 2015-04-17 DIAGNOSIS — I10 Essential (primary) hypertension: Secondary | ICD-10-CM | POA: Diagnosis not present

## 2015-04-17 DIAGNOSIS — M79673 Pain in unspecified foot: Secondary | ICD-10-CM | POA: Insufficient documentation

## 2015-04-17 DIAGNOSIS — R202 Paresthesia of skin: Secondary | ICD-10-CM

## 2015-04-17 DIAGNOSIS — N183 Chronic kidney disease, stage 3 unspecified: Secondary | ICD-10-CM | POA: Insufficient documentation

## 2015-04-17 DIAGNOSIS — G2581 Restless legs syndrome: Secondary | ICD-10-CM | POA: Insufficient documentation

## 2015-04-17 DIAGNOSIS — J45909 Unspecified asthma, uncomplicated: Secondary | ICD-10-CM | POA: Insufficient documentation

## 2015-04-17 DIAGNOSIS — D631 Anemia in chronic kidney disease: Secondary | ICD-10-CM | POA: Diagnosis not present

## 2015-04-17 DIAGNOSIS — I6523 Occlusion and stenosis of bilateral carotid arteries: Secondary | ICD-10-CM

## 2015-04-17 DIAGNOSIS — K610 Anal abscess: Secondary | ICD-10-CM | POA: Insufficient documentation

## 2015-04-17 DIAGNOSIS — N2581 Secondary hyperparathyroidism of renal origin: Secondary | ICD-10-CM | POA: Diagnosis not present

## 2015-04-17 DIAGNOSIS — F419 Anxiety disorder, unspecified: Secondary | ICD-10-CM | POA: Insufficient documentation

## 2015-04-17 DIAGNOSIS — R809 Proteinuria, unspecified: Secondary | ICD-10-CM | POA: Diagnosis not present

## 2015-04-17 DIAGNOSIS — R5383 Other fatigue: Secondary | ICD-10-CM | POA: Insufficient documentation

## 2015-04-17 DIAGNOSIS — R0609 Other forms of dyspnea: Secondary | ICD-10-CM

## 2015-04-17 DIAGNOSIS — J309 Allergic rhinitis, unspecified: Secondary | ICD-10-CM | POA: Insufficient documentation

## 2015-04-17 MED ORDER — DILTIAZEM HCL 120 MG PO TABS: 120.0000 mg | ORAL_TABLET | Freq: Every day | ORAL | Status: DC

## 2015-04-17 MED ORDER — LEVOTHYROXINE SODIUM 75 MCG PO TABS: 75.0000 ug | ORAL_TABLET | Freq: Every day | ORAL | Status: DC

## 2015-04-17 NOTE — Progress Notes (Signed)
Patient: Carolyn Hendricks Female    DOB: 1936/02/06   79 y.o.   MRN: [id number] Visit Date: 04/17/2015  Today's Provider: [name], MD   Chief Complaint  Patient presents with  . Hypothyroidism    follow up  . Hypertension    follow up   Subjective:    Hypertension Associated symptoms include chest pain (occasionally, not exertional) and shortness of breath (with exertion). Pertinent negatives include no headaches.    Hypertension, follow-up:  BP Readings from Last 3 Encounters:  02/26/14 130/98  03/28/12 140/63    She was last seen for hypertension 4 months ago.  BP at that visit was 138/72. Management changes since that visit include checking labs which showed Kidney functions had worsened. Patient was advised to follow up with Nephrology. . She reports good compliance with treatment. She is having side effects.  She is not exercising. She is adherent to low salt diet.   Outside blood pressures are  Not being checked. She is experiencing chest pain, dyspnea, fatigue and lower extremity edema.  Patient denies syncope.   Cardiovascular risk factors include advanced age (older than 22 for men, 76 for women), hypertension and sedentary lifestyle.  Use of agents associated with hypertension: thyroid hormones.     Weight trend: increasing steadily Wt Readings from Last 3 Encounters:  02/26/14 188 lb 8 oz (85.503 kg)  03/28/12 173 lb (78.472 kg)    Current diet: low salt  ------------------------------------------------------------------------  Hypothyroidism Follow up:  Last office visit was 12/16/2014. Changes made during this visit includes increasing Levothyroxine to 52mg daily. Patient report good compliance and good tolerance with treatment.      Current outpatient prescriptions:  .  aspirin 81 MG tablet, Take 81 mg by mouth daily., Disp: , Rfl:  .  cetirizine (ZYRTEC) 10 MG tablet, Take 10 mg by mouth daily., Disp: , Rfl:  .  Cyanocobalamin  (VITAMIN B-12 PO), Take by mouth daily., Disp: , Rfl:  .  diltiazem (CARDIZEM) 120 MG tablet, Take 1 tablet (120 mg total) by mouth daily., Disp: 30 tablet, Rfl: 6 .  fluticasone (FLONASE) 50 MCG/ACT nasal spray, Place 1-2 sprays into both nostrils daily., Disp: , Rfl:  .  HYDROcodone-acetaminophen (NORCO) 10-325 MG per tablet, Take 1 tablet by mouth every 4 (four) hours as needed., Disp: 180 tablet, Rfl: 0 .  levocetirizine (XYZAL) 5 MG tablet, Take 5 mg by mouth every evening., Disp: , Rfl:  .  levothyroxine (SYNTHROID, LEVOTHROID) 75 MCG tablet, Take 1 tablet (75 mcg total) by mouth daily., Disp: 30 tablet, Rfl: 6 .  LORazepam (ATIVAN) 0.5 MG tablet, Take 0.5 mg by mouth every 8 (eight) hours., Disp: , Rfl:  .  meloxicam (MOBIC) 15 MG tablet, Take 15 mg by mouth daily. , Disp: , Rfl:  .  metoprolol tartrate (LOPRESSOR) 25 MG tablet, Take 1 tablet (25 mg total) by mouth 2 (two) times daily., Disp: 60 tablet, Rfl: 12 .  Multiple Vitamin (MULTI-VITAMIN DAILY PO), Take by mouth., Disp: , Rfl:  .  mupirocin ointment (BACTROBAN) 2 %, Apply 1 application topically 3 (three) times daily. , Disp: , Rfl:  .  sertraline (ZOLOFT) 50 MG tablet, Take 50 mg by mouth daily., Disp: , Rfl:  .  simvastatin (ZOCOR) 10 MG tablet, Take 10 mg by mouth daily at 6 PM. , Disp: , Rfl:  .  traZODone (DESYREL) 100 MG tablet, 1/2-1 tablet at bedtime as needed, Disp: , Rfl:  .  valsartan-hydrochlorothiazide (DIOVAN-HCT) 320-12.5 MG per tablet, Take 1/2 tablet by mouth every day, Disp: , Rfl:     Review of Systems  Constitutional: Positive for fatigue.  Respiratory: Positive for shortness of breath (with exertion). Negative for cough and chest tightness.   Cardiovascular: Positive for chest pain (occasionally, not exertional) and leg swelling (in both feet; started 5 days ago).  Neurological: Negative for dizziness, speech difficulty, numbness and headaches.    History  Substance Use Topics  . Smoking status: Former  Smoker -- 0.25 packs/day for 26 years  . Smokeless tobacco: Not on file     Comment: Patient has smoked for 26 years; has quit but now started back 0.25 ppw.  . Alcohol Use: No   Objective:   BP 130/76 mmHg  Pulse 68  Temp(Src) 98.7 F (37.1 C) (Oral)  Resp 16  Wt 189 lb (85.73 kg)  SpO2 95%  Physical Exam   General Appearance:    Alert, cooperative, no distress  Eyes:    PERRL, conjunctiva/corneas clear, EOM's intact       Lungs:     Clear to auscultation bilaterally, respirations unlabored  Heart:    Regular rate and rhythm. 2+ bipedal edema  Neurologic:   Awake, alert, oriented x 3. No apparent focal neurological           defect.            Assessment & Plan:     1. Other specified hypothyroidism  - TSH  2. Dyspnea on exertion  - Brain natriuretic peptide - CBC - Comprehensive metabolic panel  3. Carotid stenosis, bilateral No neuro signs. Continue routine follow up with vascular surgery.   4. Hyperlipidemia - She is tolerating simvastatin well with no adverse effects.    5. Edema.      [name], MD  [facility]

## 2015-04-27 DIAGNOSIS — R0609 Other forms of dyspnea: Secondary | ICD-10-CM | POA: Diagnosis not present

## 2015-04-27 DIAGNOSIS — N183 Chronic kidney disease, stage 3 (moderate): Secondary | ICD-10-CM | POA: Diagnosis not present

## 2015-04-27 DIAGNOSIS — N2581 Secondary hyperparathyroidism of renal origin: Secondary | ICD-10-CM | POA: Diagnosis not present

## 2015-04-27 DIAGNOSIS — R809 Proteinuria, unspecified: Secondary | ICD-10-CM | POA: Diagnosis not present

## 2015-04-27 DIAGNOSIS — I10 Essential (primary) hypertension: Secondary | ICD-10-CM | POA: Diagnosis not present

## 2015-04-27 DIAGNOSIS — E038 Other specified hypothyroidism: Secondary | ICD-10-CM | POA: Diagnosis not present

## 2015-04-27 DIAGNOSIS — R609 Edema, unspecified: Secondary | ICD-10-CM | POA: Diagnosis not present

## 2015-04-28 LAB — CBC
HEMOGLOBIN: 11.3 g/dL (ref 11.1–15.9)
Hematocrit: 33.6 % — ABNORMAL LOW (ref 34.0–46.6)
MCH: 31.1 pg (ref 26.6–33.0)
MCHC: 33.6 g/dL (ref 31.5–35.7)
MCV: 93 fL (ref 79–97)
Platelets: 200 10*3/uL (ref 150–379)
RBC: 3.63 x10E6/uL — ABNORMAL LOW (ref 3.77–5.28)
RDW: 14.8 % (ref 12.3–15.4)
WBC: 4.8 10*3/uL (ref 3.4–10.8)

## 2015-04-28 LAB — COMPREHENSIVE METABOLIC PANEL
ALK PHOS: 48 IU/L (ref 39–117)
ALT: 11 IU/L (ref 0–32)
AST: 17 IU/L (ref 0–40)
Albumin/Globulin Ratio: 1.7 (ref 1.1–2.5)
Albumin: 3.8 g/dL (ref 3.5–4.8)
BUN / CREAT RATIO: 18 (ref 11–26)
BUN: 30 mg/dL — AB (ref 8–27)
Bilirubin Total: 0.3 mg/dL (ref 0.0–1.2)
CO2: 28 mmol/L (ref 18–29)
CREATININE: 1.66 mg/dL — AB (ref 0.57–1.00)
Calcium: 9 mg/dL (ref 8.7–10.3)
Chloride: 103 mmol/L (ref 97–108)
GFR calc non Af Amer: 30 mL/min/{1.73_m2} — ABNORMAL LOW (ref >59)
GFR, EST AFRICAN AMERICAN: 34 mL/min/{1.73_m2} — AB (ref >59)
GLOBULIN, TOTAL: 2.2 g/dL (ref 1.5–4.5)
Glucose: 99 mg/dL (ref 65–99)
Potassium: 4.5 mmol/L (ref 3.5–5.2)
Sodium: 146 mmol/L — ABNORMAL HIGH (ref 134–144)
TOTAL PROTEIN: 6 g/dL (ref 6.0–8.5)

## 2015-04-28 LAB — TSH: TSH: 0.951 u[IU]/mL (ref 0.450–4.500)

## 2015-04-28 LAB — BRAIN NATRIURETIC PEPTIDE: BNP: 252.9 pg/mL — AB (ref 0.0–100.0)

## 2015-04-29 ENCOUNTER — Telehealth: Payer: Self-pay | Admitting: *Deleted

## 2015-04-29 MED ORDER — FUROSEMIDE 20 MG PO TABS: 20.0000 mg | ORAL_TABLET | Freq: Every day | ORAL | Status: DC

## 2015-04-29 NOTE — Telephone Encounter (Signed)
Patient's daughter notified of results. Rx sent to pharmacy.

## 2015-04-29 NOTE — Telephone Encounter (Signed)
-----  Message from Birdie Sons, MD sent at 04/28/2015  9:46 PM EDT ----- Labs show elevated BNP which is a sign of weak heart and fluid retention which could be causing shortness of breath. Recommend she start furosemide 34m #30, rf x 0 and follow up in 2-3 weeks.

## 2015-05-01 ENCOUNTER — Ambulatory Visit: Payer: Self-pay | Admitting: Family Medicine

## 2015-05-07 ENCOUNTER — Other Ambulatory Visit: Payer: Self-pay | Admitting: Family Medicine

## 2015-05-07 DIAGNOSIS — M2550 Pain in unspecified joint: Secondary | ICD-10-CM

## 2015-05-07 MED ORDER — HYDROCODONE-ACETAMINOPHEN 10-325 MG PO TABS: 1.0000 | ORAL_TABLET | ORAL | Status: DC | PRN

## 2015-05-07 NOTE — Telephone Encounter (Signed)
Pt contacted office for refill request on the following medications:  HYDROcodone-acetaminophen ([place]) 10-325 MG.  CB#[phone]/[id number]

## 2015-05-11 ENCOUNTER — Telehealth: Payer: Self-pay

## 2015-05-11 NOTE — Telephone Encounter (Signed)
Pt hasn't been seen in over a year. She is sched to see Dr. Rockey Situ on 05/14/15.

## 2015-05-11 NOTE — Telephone Encounter (Signed)
Pt c/o swelling: STAT is pt has developed SOB within 24 hours  1. How long have you been experiencing swelling? About 1 month  2. Where is the swelling located? Both ankles  3.  Are you currently taking a "fluid pill"? yes  4.  Are you currently SOB? Started 3 weeks ago  5.  Have you traveled recently?no  Pt has an appt on 05/14/2015

## 2015-05-13 ENCOUNTER — Ambulatory Visit (INDEPENDENT_AMBULATORY_CARE_PROVIDER_SITE_OTHER): Payer: Medicare Other | Admitting: Family Medicine

## 2015-05-13 ENCOUNTER — Encounter: Payer: Self-pay | Admitting: Family Medicine

## 2015-05-13 ENCOUNTER — Ambulatory Visit
Admission: RE | Admit: 2015-05-13 | Discharge: 2015-05-13 | Disposition: A | Discharge status: Home / Self Care | Payer: Medicare Other | Source: Ambulatory Visit | Attending: Family Medicine | Admitting: Family Medicine

## 2015-05-13 VITALS — BP 152/84 | HR 66 | Temp 98.2°F | Resp 18 | Wt 188.0 lb

## 2015-05-13 DIAGNOSIS — M419 Scoliosis, unspecified: Secondary | ICD-10-CM | POA: Diagnosis not present

## 2015-05-13 DIAGNOSIS — R05 Cough: Secondary | ICD-10-CM | POA: Diagnosis not present

## 2015-05-13 DIAGNOSIS — R0609 Other forms of dyspnea: Secondary | ICD-10-CM

## 2015-05-13 DIAGNOSIS — J9811 Atelectasis: Secondary | ICD-10-CM | POA: Diagnosis not present

## 2015-05-13 DIAGNOSIS — I517 Cardiomegaly: Secondary | ICD-10-CM | POA: Insufficient documentation

## 2015-05-13 DIAGNOSIS — R0602 Shortness of breath: Secondary | ICD-10-CM

## 2015-05-13 DIAGNOSIS — I6523 Occlusion and stenosis of bilateral carotid arteries: Secondary | ICD-10-CM | POA: Diagnosis not present

## 2015-05-13 DIAGNOSIS — R059 Cough, unspecified: Secondary | ICD-10-CM

## 2015-05-13 DIAGNOSIS — J438 Other emphysema: Secondary | ICD-10-CM

## 2015-05-13 MED ORDER — FLUTICASONE-SALMETEROL 250-50 MCG/DOSE IN AEPB: 1.0000 | INHALATION_SPRAY | Freq: Two times a day (BID) | RESPIRATORY_TRACT | Status: DC

## 2015-05-13 NOTE — Progress Notes (Signed)
Patient: Carolyn Hendricks Female    DOB: 11/30/1935   79 y.o.   MRN: [id number] Visit Date: 05/13/2015  Today's Provider: [name], MD   Chief Complaint  Patient presents with  . Edema    follow up   Subjective:    HPI Edma Follow up: Patient comes in today with swelling in both feet, ankles and legs. Last visit was 3 weeks ago and patient was found to have mildly elevated BNP and we increase furosemide to 2 a day, but swelling has not changed significantly. Patient has pain in her lower legs and shortness of breath. Breathing improves when using inhaler. Patient has an appointment to see Dr. Rockey Situ tomorrow.   History  Substance Use Topics  . Smoking status: Former Smoker -- 1.00 packs/day for 26 years    Types: Cigarettes    Quit date: 10/11/2011  . Smokeless tobacco: Not on file     Comment: Patient has smoked for 26 years; has quit but now started back 0.25 ppw.  . Alcohol Use: No   Allergies  Allergen Reactions  . Atorvastatin     Other reaction(s): Muscle Pain  . Crestor  [Rosuvastatin Calcium]     Other reaction(s): Muscle Pain  . Pravastatin Sodium     Muscle pain   Previous Medications   ASPIRIN 81 MG TABLET    Take 81 mg by mouth daily.   CETIRIZINE (ZYRTEC) 10 MG TABLET    Take 10 mg by mouth daily.   CLOPIDOGREL (PLAVIX) 75 MG TABLET    Take 75 mg by mouth daily.   CYANOCOBALAMIN (VITAMIN B-12 PO)    Take by mouth daily.   DILTIAZEM (CARDIZEM) 120 MG TABLET    Take 1 tablet (120 mg total) by mouth daily.   FLUTICASONE (FLONASE) 50 MCG/ACT NASAL SPRAY    Place 1-2 sprays into both nostrils daily.   FUROSEMIDE (LASIX) 20 MG TABLET    Take 1 tablet (20 mg total) by mouth daily.   HYDROCODONE-ACETAMINOPHEN (NORCO) 10-325 MG PER TABLET    Take 1 tablet by mouth every 4 (four) hours as needed.   LEVOCETIRIZINE (XYZAL) 5 MG TABLET    Take 5 mg by mouth every evening.   LEVOTHYROXINE (SYNTHROID, LEVOTHROID) 75 MCG TABLET    Take 1 tablet (75 mcg total) by  mouth daily.   LORAZEPAM (ATIVAN) 0.5 MG TABLET    Take 0.5 mg by mouth every 8 (eight) hours.   MELOXICAM (MOBIC) 15 MG TABLET    Take 15 mg by mouth daily.    METOPROLOL TARTRATE (LOPRESSOR) 25 MG TABLET    Take 1 tablet (25 mg total) by mouth 2 (two) times daily.   MULTIPLE VITAMIN (MULTI-VITAMIN DAILY PO)    Take by mouth.   MUPIROCIN OINTMENT (BACTROBAN) 2 %    Apply 1 application topically 3 (three) times daily.    SERTRALINE (ZOLOFT) 50 MG TABLET    Take 50 mg by mouth daily.   SIMVASTATIN (ZOCOR) 10 MG TABLET    Take 10 mg by mouth daily at 6 PM.    TRAZODONE (DESYREL) 100 MG TABLET    1/2-1 tablet at bedtime as needed   VALSARTAN (DIOVAN) 320 MG TABLET    Take 160 mg by mouth daily.    Review of Systems  Constitutional: Positive for diaphoresis and fatigue. Negative for fever and chills.  Respiratory: Positive for cough, chest tightness, shortness of breath and wheezing.   Cardiovascular: Positive for  chest pain, palpitations and leg swelling.  Neurological: Positive for dizziness, weakness, light-headedness and headaches. Negative for tremors, seizures, syncope and numbness.  All other systems reviewed and are negative.   History  Substance Use Topics  . Smoking status: Former Smoker -- 1.00 packs/day for 26 years    Types: Cigarettes    Quit date: 10/11/2011  . Smokeless tobacco: Not on file     Comment: Patient has smoked for 26 years; has quit but now started back 0.25 ppw.  . Alcohol Use: No   Objective:   BP 152/84 mmHg  Pulse 66  Temp(Src) 98.2 F (36.8 C) (Oral)  Resp 18  Wt 188 lb (85.276 kg)  SpO2 94%  Physical Exam   General Appearance:    Alert, cooperative, no distress  Eyes:    PERRL, conjunctiva/corneas clear, EOM's intact       Lungs:     Clear to auscultation bilaterally, respirations unlabored. Diminished breath sounds.   Heart:    Regular rate and rhythm. III/VI SM. 2+ bilateral LE edema.   Neurologic:   Awake, alert, oriented x 3. No apparent  focal neurological           defect.      Spirometry: Moderetly severe obstruction with low vital capacity     Assessment & Plan:     1. Dyspnea on exertion Multifactorial. Moderately severe COPD which should improved with inhaler as below. May be cardiac component considering elevated BNP. Follow up with Dr. Rockey Situ tomorrow .as sheduled.  - DG Chest 2 View; Future  3. Cough  - DG Chest 2 View; Future  4. Other emphysema  - Fluticasone-Salmeterol (ADVAIR DISKUS) 250-50 MCG/DOSE AEPB; Inhale 1 puff into the lungs every 12 (twelve) hours.  Dispense: 60 each; Refill: 0       [name], MD  [facility]

## 2015-05-14 ENCOUNTER — Ambulatory Visit (INDEPENDENT_AMBULATORY_CARE_PROVIDER_SITE_OTHER): Payer: Medicare Other | Admitting: Cardiovascular Disease

## 2015-05-14 ENCOUNTER — Encounter: Payer: Self-pay | Admitting: Cardiovascular Disease

## 2015-05-14 VITALS — BP 132/62 | HR 65 | Ht 63.0 in | Wt 186.8 lb

## 2015-05-14 DIAGNOSIS — R9431 Abnormal electrocardiogram [ECG] [EKG]: Secondary | ICD-10-CM | POA: Diagnosis not present

## 2015-05-14 DIAGNOSIS — M7989 Other specified soft tissue disorders: Secondary | ICD-10-CM | POA: Insufficient documentation

## 2015-05-14 DIAGNOSIS — I10 Essential (primary) hypertension: Secondary | ICD-10-CM

## 2015-05-14 DIAGNOSIS — I209 Angina pectoris, unspecified: Secondary | ICD-10-CM | POA: Diagnosis not present

## 2015-05-14 DIAGNOSIS — I6523 Occlusion and stenosis of bilateral carotid arteries: Secondary | ICD-10-CM

## 2015-05-14 DIAGNOSIS — R0602 Shortness of breath: Secondary | ICD-10-CM | POA: Diagnosis not present

## 2015-05-14 DIAGNOSIS — I471 Supraventricular tachycardia: Secondary | ICD-10-CM

## 2015-05-14 DIAGNOSIS — R079 Chest pain, unspecified: Secondary | ICD-10-CM

## 2015-05-14 DIAGNOSIS — E785 Hyperlipidemia, unspecified: Secondary | ICD-10-CM

## 2015-05-14 MED ORDER — NITROGLYCERIN 0.4 MG SL SUBL: 0.4000 mg | SUBLINGUAL_TABLET | SUBLINGUAL | Status: AC | PRN

## 2015-05-14 MED ORDER — ISOSORBIDE MONONITRATE ER 30 MG PO TB24: 30.0000 mg | ORAL_TABLET | Freq: Every day | ORAL | Status: DC

## 2015-05-14 NOTE — Assessment & Plan Note (Signed)
Bilateral leg edema with no dramatic improvement with high dose Lasix. She has had worsening renal function on the Lasix. I suspect leg edema is from venous insufficiency, not from diastolic CHF. Given creatinine 1.6 last week which is well above her baseline, recommended she hold the Lasix. We will recheck her BMP next week. If creatinine improves, we'll schedule cardiac catheterization. Recommended she wear compression hose, leg elevation Also recommended she hold her diltiazem as this may be contributing to her worsening venous insufficiency

## 2015-05-14 NOTE — Assessment & Plan Note (Signed)
We'll hold her diltiazem as this could be contributing to leg swelling We'll start isosorbide mononitrate 30 mg daily given her chest discomfort, concern for angina and coronary artery disease. Other options for blood pressure include clonidine, hydralazine, Cardura We'll avoid calcium channel blockers Potentially could increase the beta blocker

## 2015-05-14 NOTE — Assessment & Plan Note (Signed)
Encouraged her to stay on her simvastatin. May need higher dose for goal LDL less than 70

## 2015-05-14 NOTE — Patient Instructions (Addendum)
You have a new EKG change  Please hold the diltiazem (this can cause leg swelling)  Please wear compression hose  No more lasix for now   Please start isosorbide (imdur 30 mg daily) for blood pressure  Pre cardiac cath labs next week  Please call us if you have new issues that need to be addressed before your next appt.  Your physician wants you to follow-up in: 1 month

## 2015-05-14 NOTE — Assessment & Plan Note (Signed)
I'm concerned about her symptoms which are concerning for angina.. Worsening the past several weeks, increasing shortness of breath despite increasing doses of Lasix. New EKG changes with left bundle branch block, left anterior fascicular block Given her underlying PAD, she is high risk of CAD. She'll be a poor candidate for nuclear stress testing given her size, there would be dramatic breast attenuation artifact. She is unable to treadmill. We have discussed the various treatment options with her. Given stress testing would likely be of limited benefit, we will schedule her for cardiac catheterization. We will repeat her lab work next week given her elevated creatinine, BUN likely from overdiuresis.

## 2015-05-14 NOTE — Assessment & Plan Note (Signed)
She denies any recent events concerning for arrhythmia. We'll continue her metoprolol. If she has breakthrough tachycardia, will increase her metoprolol as we are stopping her diltiazem

## 2015-05-14 NOTE — Progress Notes (Signed)
Patient ID: Carolyn Hendricks, female    DOB: 1936/01/05, 79 y.o.   MRN: [id number]  HPI Comments: Ms. Granato is a 79 year old woman with history of hypertension, MRSA, long smoking history for 20 years, arthritis, presented to the emergency room May 26th 2013 with a buttock pain from abscess, also with malaise, nausea vomiting, fever. Cardiology consult in for atrial tachycardia, heart rate of 150 beats per minute. Heart rate resolved with calcium channel blocker. Noted to have left carotid bruit with ultrasound showing 75% plus left carotid stenosis. Now status post CEA on the left On previous office visit, she reported having episodes of tachycardia lasting for hours at a time dating back many years  In follow-up today, she reports that she does not feel well. She has chest tightness with exertion, shortness of breath. This has been coming on for several weeks, getting worse. She was started on Lasix 20 mg daily by Dr. Caryn Section for leg edema. Dosing was increased up to 20 mg twice a day by kidney physician, Dr. Abigail Butts. Lab work from 04/27/2015 shows creatinine 1.66, BUN 30 which is well above her baseline Since her last clinic visit, diltiazem was started. She is taking 120 mg daily.  In general she reports that she was doing well at the beginning part of the summer, working in her garden, planting flowers, [employer] research officer, government. Now she cannot do this and is exhausted after walking around her house.  She does report continued ankle and leg swelling despite taking increasing doses of Lasix. Her biggest complaint is general malaise, cough  EKG on today's visit shows normal sinus rhythm with rate 65 bpm, new intraventricular conduction delay/borderline left bundle branch block, left anterior fascicular block. This was not seen on prior EKG  Other past medical history  angiography of her carotid 02/20/2014 showing severe disease on the left.   possible TIA , evaluation in the hospital, discharge 02/20/2014. She had  dizziness, lightheadedness, bradycardia. She was continued on her beta blocker, Cardizem was held for bradycardia  Echocardiogram performed 02/17/2014 while she was in the hospital. This showed normal ejection fraction, diastolic dysfunction, mild MR and TR She stopped smoking approximately several years ago  She is tolerating simvastatin 10 mg daily with no side effects. She reports having side effects on Crestor and Lipitor.  She has  seen the renal service and diagnosed with stage III chronic kidney disease.  Echocardiogram in the hospital 03/04/2012 showed normal LV systolic function, diastolic dysfunction, otherwise normal study     Allergies  Allergen Reactions  . Atorvastatin     Other reaction(s): Muscle Pain  . Crestor  [Rosuvastatin Calcium]     Other reaction(s): Muscle Pain  . Pravastatin Sodium     Muscle pain    Current Outpatient Prescriptions on File Prior to Visit  Medication Sig Dispense Refill  . aspirin 81 MG tablet Take 81 mg by mouth daily.    . cetirizine (ZYRTEC) 10 MG tablet Take 10 mg by mouth daily.    . clopidogrel (PLAVIX) 75 MG tablet Take 75 mg by mouth daily.    . Cyanocobalamin (VITAMIN B-12 PO) Take by mouth daily.    . fluticasone (FLONASE) 50 MCG/ACT nasal spray Place 1-2 sprays into both nostrils daily.    . Fluticasone-Salmeterol (ADVAIR DISKUS) 250-50 MCG/DOSE AEPB Inhale 1 puff into the lungs every 12 (twelve) hours. 60 each 0  . furosemide (LASIX) 20 MG tablet Take 1 tablet (20 mg total) by mouth daily. 30 tablet 0  .  HYDROcodone-acetaminophen (NORCO) 10-325 MG per tablet Take 1 tablet by mouth every 4 (four) hours as needed. 180 tablet 0  . levocetirizine (XYZAL) 5 MG tablet Take 5 mg by mouth every evening.    Marland Kitchen levothyroxine (SYNTHROID, LEVOTHROID) 75 MCG tablet Take 1 tablet (75 mcg total) by mouth daily. 30 tablet 6  . LORazepam (ATIVAN) 0.5 MG tablet Take 0.5 mg by mouth every 8 (eight) hours.    . meloxicam (MOBIC) 15 MG tablet Take  15 mg by mouth daily.     . metoprolol tartrate (LOPRESSOR) 25 MG tablet Take 1 tablet (25 mg total) by mouth 2 (two) times daily. 60 tablet 12  . Multiple Vitamin (MULTI-VITAMIN DAILY PO) Take by mouth.    . mupirocin ointment (BACTROBAN) 2 % Apply 1 application topically 3 (three) times daily.     . sertraline (ZOLOFT) 50 MG tablet Take 50 mg by mouth daily.    . simvastatin (ZOCOR) 10 MG tablet Take 10 mg by mouth daily at 6 PM.     . traZODone (DESYREL) 100 MG tablet 1/2-1 tablet at bedtime as needed     No current facility-administered medications on file prior to visit.    Past Medical History  Diagnosis Date  . Heart murmur   . Head ache   . Chest pain   . Shortness of breath   . Dizziness   . High blood pressure   . Arthritis   . History of MRSA infection   . Chronic kidney disease     Past Surgical History  Procedure Laterality Date  . Carotid endarterectomy Left Dr. Lucky Cowboy  . Knee replacement (other) Left 2002  . Foot and ankle surgery    . Appendectomy  1950    Social History  reports that she quit smoking about 3 years ago. Her smoking use included Cigarettes. She has a 26 pack-year smoking history. She does not have any smokeless tobacco history on file. She reports that she does not drink alcohol or use illicit drugs.  Family History family history includes Cancer in her brother; Congestive Heart Failure (age of onset: 37) in her mother; Kidney failure in her father.        Review of Systems  Constitutional: Positive for fatigue.  Respiratory: Positive for chest tightness and shortness of breath.   Cardiovascular: Positive for chest pain.  Gastrointestinal: Negative.   Musculoskeletal: Positive for back pain.  Skin: Negative.   Neurological: Positive for weakness.  Hematological: Negative.   Psychiatric/Behavioral: Negative.   All other systems reviewed and are negative.  BP 132/62 mmHg  Pulse 65  Ht _0  (1.6 m)  Wt 186 lb 12 oz (84.709 kg)  BMI  33.09 kg/m2  Physical Exam  Constitutional: She is oriented to person, place, and time. She appears well-developed and well-nourished.  HENT:  Head: Normocephalic.  Nose: Nose normal.  Mouth/Throat: Oropharynx is clear and moist.  Eyes: Conjunctivae are normal. Pupils are equal, round, and reactive to light.  Neck: Normal range of motion. Neck supple. No JVD present. Carotid bruit is present.  Cardiovascular: Normal rate, regular rhythm, S1 normal, S2 normal, normal heart sounds and intact distal pulses.  Exam reveals no gallop and no friction rub.   No murmur heard. Pulmonary/Chest: Effort normal. No respiratory distress. She has decreased breath sounds. She has no wheezes. She has no rales. She exhibits no tenderness.  Abdominal: Soft. Bowel sounds are normal. She exhibits no distension. There is no tenderness.  Musculoskeletal: Normal range  of motion. She exhibits no edema or tenderness.  Lymphadenopathy:    She has no cervical adenopathy.  Neurological: She is alert and oriented to person, place, and time. Coordination normal.  Skin: Skin is warm and dry. No rash noted. No erythema.  Psychiatric: She has a normal mood and affect. Her behavior is normal. Judgment and thought content normal.    Assessment and Plan  Nursing note and vitals reviewed.

## 2015-05-15 ENCOUNTER — Emergency Department: Payer: Medicare Other

## 2015-05-15 ENCOUNTER — Encounter: Payer: Self-pay | Admitting: Emergency Medicine

## 2015-05-15 ENCOUNTER — Emergency Department
Admission: EM | Admit: 2015-05-15 | Discharge: 2015-05-16 | Disposition: A | Discharge status: Home / Self Care | Payer: Medicare Other | Attending: Emergency Medicine | Admitting: Emergency Medicine

## 2015-05-15 ENCOUNTER — Telehealth: Payer: Self-pay

## 2015-05-15 ENCOUNTER — Other Ambulatory Visit: Payer: Self-pay

## 2015-05-15 DIAGNOSIS — Z7982 Long term (current) use of aspirin: Secondary | ICD-10-CM | POA: Insufficient documentation

## 2015-05-15 DIAGNOSIS — Z7951 Long term (current) use of inhaled steroids: Secondary | ICD-10-CM | POA: Insufficient documentation

## 2015-05-15 DIAGNOSIS — Z791 Long term (current) use of non-steroidal anti-inflammatories (NSAID): Secondary | ICD-10-CM | POA: Insufficient documentation

## 2015-05-15 DIAGNOSIS — R519 Headache, unspecified: Secondary | ICD-10-CM

## 2015-05-15 DIAGNOSIS — N183 Chronic kidney disease, stage 3 (moderate): Secondary | ICD-10-CM | POA: Diagnosis not present

## 2015-05-15 DIAGNOSIS — I159 Secondary hypertension, unspecified: Secondary | ICD-10-CM | POA: Insufficient documentation

## 2015-05-15 DIAGNOSIS — Z79899 Other long term (current) drug therapy: Secondary | ICD-10-CM | POA: Diagnosis not present

## 2015-05-15 DIAGNOSIS — Z87891 Personal history of nicotine dependence: Secondary | ICD-10-CM | POA: Insufficient documentation

## 2015-05-15 DIAGNOSIS — R51 Headache: Secondary | ICD-10-CM | POA: Diagnosis not present

## 2015-05-15 DIAGNOSIS — R0602 Shortness of breath: Secondary | ICD-10-CM | POA: Diagnosis not present

## 2015-05-15 DIAGNOSIS — I129 Hypertensive chronic kidney disease with stage 1 through stage 4 chronic kidney disease, or unspecified chronic kidney disease: Secondary | ICD-10-CM | POA: Diagnosis present

## 2015-05-15 HISTORY — DX: Heart failure, unspecified: I50.9

## 2015-05-15 LAB — COMPREHENSIVE METABOLIC PANEL
ALK PHOS: 46 U/L (ref 38–126)
ALT: 9 U/L — AB (ref 14–54)
AST: 16 U/L (ref 15–41)
Albumin: 3.9 g/dL (ref 3.5–5.0)
Anion gap: 7 (ref 5–15)
BILIRUBIN TOTAL: 0.3 mg/dL (ref 0.3–1.2)
BUN: 30 mg/dL — ABNORMAL HIGH (ref 6–20)
CO2: 31 mmol/L (ref 22–32)
Calcium: 9.1 mg/dL (ref 8.9–10.3)
Chloride: 101 mmol/L (ref 101–111)
Creatinine, Ser: 1.46 mg/dL — ABNORMAL HIGH (ref 0.44–1.00)
GFR calc Af Amer: 39 mL/min — ABNORMAL LOW (ref >60)
GFR, EST NON AFRICAN AMERICAN: 33 mL/min — AB (ref >60)
Glucose, Bld: 95 mg/dL (ref 65–99)
Potassium: 5 mmol/L (ref 3.5–5.1)
Sodium: 139 mmol/L (ref 135–145)
Total Protein: 6.6 g/dL (ref 6.5–8.1)

## 2015-05-15 LAB — CBC
HEMATOCRIT: 33.3 % — AB (ref 35.0–47.0)
HEMOGLOBIN: 11.3 g/dL — AB (ref 12.0–16.0)
MCH: 31.3 pg (ref 26.0–34.0)
MCHC: 33.7 g/dL (ref 32.0–36.0)
MCV: 92.8 fL (ref 80.0–100.0)
Platelets: 185 10*3/uL (ref 150–440)
RBC: 3.59 MIL/uL — ABNORMAL LOW (ref 3.80–5.20)
RDW: 14 % (ref 11.5–14.5)
WBC: 5.5 10*3/uL (ref 3.6–11.0)

## 2015-05-15 LAB — TROPONIN I

## 2015-05-15 LAB — BRAIN NATRIURETIC PEPTIDE: B Natriuretic Peptide: 396 pg/mL — ABNORMAL HIGH (ref 0.0–100.0)

## 2015-05-15 MED ORDER — SODIUM CHLORIDE 0.9 % IV BOLUS (SEPSIS): 250.0000 mL | Freq: Once | INTRAVENOUS | Status: DC

## 2015-05-15 MED ORDER — PROCHLORPERAZINE MALEATE 10 MG PO TABS
10.0000 mg | ORAL_TABLET | Freq: Once | ORAL | Status: AC
  Administered 2015-05-16: 10 mg via ORAL
  Filled 2015-05-15 (×2): qty 1

## 2015-05-15 MED ORDER — ACETAMINOPHEN 325 MG PO TABS
650.0000 mg | ORAL_TABLET | Freq: Once | ORAL | Status: AC
  Administered 2015-05-16: 650 mg via ORAL
  Filled 2015-05-15: qty 2

## 2015-05-15 MED ORDER — PROCHLORPERAZINE EDISYLATE 5 MG/ML IJ SOLN: 10.0000 mg | Freq: Four times a day (QID) | INTRAMUSCULAR | Status: DC | PRN

## 2015-05-15 NOTE — Telephone Encounter (Signed)
Pt advised. Pt states seh is tolerating Advair but will call back on refill, appt made for mid September to follow up-aa

## 2015-05-15 NOTE — ED Notes (Signed)
Patient states for a few weeks now she has had shortness of breath and chest pain intermittently. Patient is wearing TED hose upon arrival. Patient states last night and this am she had severe headache, took BP and noticed BP was 388'E systolic. Patient states she is to be scheduled for heart cath d/t frequent chest pain and shortness of breath.

## 2015-05-15 NOTE — ED Provider Notes (Signed)
[facility] Emergency Department Provider Note   ____________________________________________  Time seen: 2340  I have reviewed the triage vital signs and the nursing notes.   HISTORY  Chief Complaint Hypertension and Headache   History limited by: Not Limited   HPI Carolyn Hendricks is a 79 y.o. female who presents to the emergency department for primary concern of elevated blood pressure. She states this is been elevated all day. She states it was over 200. She states that her heart doctor told her to come to the emergency department if her blood pressure ever got above 200. In addition the patient states she has had a headache all day. She doesn't history of headaches. She took a BC powder in the morning however has not taken any other medications for headaches. She describes as stabbing. It is global. The patient has had chest pain for quite some time. She is scheduled for cardiac catheter by her heart doctor in the near future.     Past Medical History  Diagnosis Date  . Heart murmur   . Head ache   . Chest pain   . Shortness of breath   . Dizziness   . High blood pressure   . Arthritis   . History of MRSA infection   . Chronic kidney disease   . CHF (congestive heart failure)     Patient Active Problem List   Diagnosis Date Noted  . Angina pectoris 05/14/2015  . Leg swelling 05/14/2015  . Allergic rhinitis 04/17/2015  . Anxiety 04/17/2015  . Arthralgia of hand 04/17/2015  . Ache in joint 04/17/2015  . Chronic kidney disease (CKD), stage III (moderate) 04/17/2015  . Fatigue 04/17/2015  . Numbness and tingling 04/17/2015  . Nonspecific abnormal finding in stool contents 04/17/2015  . RAD (reactive airway disease) with wheezing 04/17/2015  . Restless leg 04/17/2015  . Foot pain 04/17/2015  . Hypothyroidism 07/09/2014  . Abnormal mammogram 11/01/2012  . Carotid stenosis 03/28/2012  . Atrial tachycardia 03/28/2012  . Hyperlipidemia  03/28/2012  . Hyperpotassemia 02/27/2010  . Heart murmur 03/10/2009  . Insomnia 03/05/2009  . Essential (primary) hypertension 01/29/2009  . Goiter 01/29/2009  . History of tobacco use 01/29/2009  . Spinal stenosis of lumbar region without neurogenic claudication 07/14/2003    Past Surgical History  Procedure Laterality Date  . Carotid endarterectomy Left Dr. Lucky Cowboy  . Knee replacement (other) Left 2002  . Foot and ankle surgery    . Appendectomy  1950    Current Outpatient Rx  Name  Route  Sig  Dispense  Refill  . aspirin 81 MG tablet   Oral   Take 81 mg by mouth daily.         . cetirizine (ZYRTEC) 10 MG tablet   Oral   Take 10 mg by mouth daily.         . clopidogrel (PLAVIX) 75 MG tablet   Oral   Take 75 mg by mouth daily.         . Cyanocobalamin (VITAMIN B-12 PO)   Oral   Take by mouth daily.         . fluticasone (FLONASE) 50 MCG/ACT nasal spray   Each Nare   Place 1-2 sprays into both nostrils daily.         . Fluticasone-Salmeterol (ADVAIR DISKUS) 250-50 MCG/DOSE AEPB   Inhalation   Inhale 1 puff into the lungs every 12 (twelve) hours.   60 each   0   . furosemide (  LASIX) 20 MG tablet   Oral   Take 1 tablet (20 mg total) by mouth daily.   30 tablet   0   . HYDROcodone-acetaminophen (NORCO) 10-325 MG per tablet   Oral   Take 1 tablet by mouth every 4 (four) hours as needed.   180 tablet   0   . isosorbide mononitrate (IMDUR) 30 MG 24 hr tablet   Oral   Take 1 tablet (30 mg total) by mouth daily.   30 tablet   11   . levocetirizine (XYZAL) 5 MG tablet   Oral   Take 5 mg by mouth every evening.         Marland Kitchen levothyroxine (SYNTHROID, LEVOTHROID) 75 MCG tablet   Oral   Take 1 tablet (75 mcg total) by mouth daily.   30 tablet   6   . LORazepam (ATIVAN) 0.5 MG tablet   Oral   Take 0.5 mg by mouth every 8 (eight) hours.         . meloxicam (MOBIC) 15 MG tablet   Oral   Take 15 mg by mouth daily.          . metoprolol  tartrate (LOPRESSOR) 25 MG tablet   Oral   Take 1 tablet (25 mg total) by mouth 2 (two) times daily.   60 tablet   12   . Multiple Vitamin (MULTI-VITAMIN DAILY PO)   Oral   Take by mouth.         . mupirocin ointment (BACTROBAN) 2 %   Topical   Apply 1 application topically 3 (three) times daily.          . nitroGLYCERIN (NITROSTAT) 0.4 MG SL tablet   Sublingual   Place 1 tablet (0.4 mg total) under the tongue every 5 (five) minutes as needed for chest pain.   25 tablet   3   . sertraline (ZOLOFT) 50 MG tablet   Oral   Take 50 mg by mouth daily.         . simvastatin (ZOCOR) 10 MG tablet   Oral   Take 10 mg by mouth daily at 6 PM.          . traZODone (DESYREL) 100 MG tablet      1/2-1 tablet at bedtime as needed         . valsartan-hydrochlorothiazide (DIOVAN-HCT) 320-12.5 MG per tablet   Oral   Take 1 tablet by mouth daily.           Allergies Atorvastatin; Crestor ; and Pravastatin sodium  Family History  Problem Relation Age of Onset  . Congestive Heart Failure Mother 14  . Cancer Brother   . Kidney failure Father     Social History History  Substance Use Topics  . Smoking status: Former Smoker -- 1.00 packs/day for 26 years    Types: Cigarettes    Quit date: 10/11/2011  . Smokeless tobacco: Not on file     Comment: Patient has smoked for 26 years; has quit but now started back 0.25 ppw.  . Alcohol Use: No    Review of Systems  Constitutional: Negative for fever. Cardiovascular: Positive for chest pain. Respiratory: Negative for shortness of breath. Gastrointestinal: Negative for abdominal pain, vomiting and diarrhea. Genitourinary: Negative for dysuria. Musculoskeletal: Negative for back pain. Skin: Negative for rash. Neurological: Positive for headaches  10-point ROS otherwise negative.  ____________________________________________   PHYSICAL EXAM:  VITAL SIGNS: ED Triage Vitals  Enc Vitals Group  BP 05/15/15 1803  184/81 mmHg     Pulse Rate 05/15/15 1803 64     Resp 05/15/15 1803 18     Temp 05/15/15 1803 98.2 F (36.8 C)     Temp Source 05/15/15 1803 Oral     SpO2 05/15/15 1803 96 %     Weight 05/15/15 1803 188 lb (85.276 kg)     Height 05/15/15 1803 5' 3" (1.6 m)     Head Cir --      Peak Flow --      Pain Score 05/15/15 1803 9   Constitutional: Alert and oriented. Well appearing and in no distress. Eyes: Conjunctivae are normal. PERRL. Normal extraocular movements. ENT   Head: Normocephalic and atraumatic.   Nose: No congestion/rhinnorhea.   Mouth/Throat: Mucous membranes are moist.   Neck: No stridor. Hematological/Lymphatic/Immunilogical: No cervical lymphadenopathy. Cardiovascular: Normal rate, regular rhythm.  No murmurs, rubs, or gallops. Respiratory: Normal respiratory effort without tachypnea nor retractions. Breath sounds are clear and equal bilaterally. No wheezes/rales/rhonchi. Gastrointestinal: Soft and nontender. No distention. There is no CVA tenderness. Genitourinary: Deferred Musculoskeletal: Normal range of motion in all extremities. No joint effusions.  No lower extremity tenderness nor edema. Neurologic:  Normal speech and language. No gross focal neurologic deficits are appreciated. Speech is normal.  Skin:  Skin is warm, dry and intact. No rash noted. Psychiatric: Mood and affect are normal. Speech and behavior are normal. Patient exhibits appropriate insight and judgment.  ____________________________________________    LABS (pertinent positives/negatives)  Labs Reviewed  CBC - Abnormal; Notable for the following:    RBC 3.59 (*)    Hemoglobin 11.3 (*)    HCT 33.3 (*)    All other components within normal limits  BRAIN NATRIURETIC PEPTIDE - Abnormal; Notable for the following:    B Natriuretic Peptide 396.0 (*)    All other components within normal limits  COMPREHENSIVE METABOLIC PANEL - Abnormal; Notable for the following:    BUN 30 (*)     Creatinine, Ser 1.46 (*)    ALT 9 (*)    GFR calc non Af Amer 33 (*)    GFR calc Af Amer 39 (*)    All other components within normal limits  TROPONIN I     ____________________________________________   EKG  I, Nance Pear, attending physician, personally viewed and interpreted this EKG  EKG Time: 1804 Rate: 69 Rhythm: sinus rhythm with marked sinus arrythmia Axis: left axis deviation Intervals: qtc 469 QRS: LBBB ST changes: no st elevation equivalants   ___________________________________________    RADIOLOGY  CXR IMPRESSION: Stable cardiac enlargement with no acute findings  ____________________________________________   PROCEDURES  Procedure(s) performed: None  Critical Care performed: No  ____________________________________________   INITIAL IMPRESSION / ASSESSMENT AND PLAN / ED COURSE  Pertinent labs & imaging results that were available during my care of the patient were reviewed by me and considered in my medical decision making (see chart for details).  She presents to the emergency department today because of concerns for headache and high blood pressure. Patient was given medications which she stated helped the headache. An additional blood pressure did come down. Discussed with patient that she should continue to take her blood pressure medications and follow-up with Dr. Rockey Situ. Discussed return cautions.  ____________________________________________   FINAL CLINICAL IMPRESSION(S) / ED DIAGNOSES  Final diagnoses:  Secondary hypertension, unspecified  Headache, unspecified headache type     Nance Pear, MD 05/16/15 0110

## 2015-05-15 NOTE — Telephone Encounter (Signed)
-----  Message from Birdie Sons, MD sent at 05/15/2015  8:00 AM EDT ----- Regarding: Chest XR Please advise patient there is no infection on Chest XR. Continue Advair diskus 250/50 which should last 2 weeks. If tolerating we can send in prescription to take 1 puff twice a day, #1 inhaler refill x 3. Follow up in about 6 weeks to recheck lung functions.

## 2015-05-16 DIAGNOSIS — I159 Secondary hypertension, unspecified: Secondary | ICD-10-CM | POA: Diagnosis not present

## 2015-05-16 NOTE — ED Notes (Signed)
Called pharmacy to send pt medication.

## 2015-05-16 NOTE — Discharge Instructions (Signed)
Please seek medical attention for any high fevers, chest pain, shortness of breath, change in behavior, persistent vomiting, bloody stool or any other new or concerning symptoms.  Headaches, Frequently Asked Questions MIGRAINE HEADACHES Q: What is migraine? What causes it? How can I treat it? A: Generally, migraine headaches begin as a dull ache. Then they develop into a constant, throbbing, and pulsating pain. You may experience pain at the temples. You may experience pain at the front or back of one or both sides of the head. The pain is usually accompanied by a combination of:  Nausea.  Vomiting.  Sensitivity to light and noise. Some people (about 15%) experience an aura (see below) before an attack. The cause of migraine is believed to be chemical reactions in the brain. Treatment for migraine may include over-the-counter or prescription medications. It may also include self-help techniques. These include relaxation training and biofeedback.  Q: What is an aura? A: About 15% of people with migraine get an "aura". This is a sign of neurological symptoms that occur before a migraine headache. You may see wavy or jagged lines, dots, or flashing lights. You might experience tunnel vision or blind spots in one or both eyes. The aura can include visual or auditory hallucinations (something imagined). It may include disruptions in smell (such as strange odors), taste or touch. Other symptoms include:  Numbness.  A "pins and needles" sensation.  Difficulty in recalling or speaking the correct word. These neurological events may last as long as 60 minutes. These symptoms will fade as the headache begins. Q: What is a trigger? A: Certain physical or environmental factors can lead to or "trigger" a migraine. These include:  Foods.  Hormonal changes.  Weather.  Stress. It is important to remember that triggers are different for everyone. To help prevent migraine attacks, you need to figure  out which triggers affect you. Keep a headache diary. This is a good way to track triggers. The diary will help you talk to your healthcare professional about your condition. Q: Does weather affect migraines? A: Bright sunshine, hot, humid conditions, and drastic changes in barometric pressure may lead to, or "trigger," a migraine attack in some people. But studies have shown that weather does not act as a trigger for everyone with migraines. Q: What is the link between migraine and hormones? A: Hormones start and regulate many of your body's functions. Hormones keep your body in balance within a constantly changing environment. The levels of hormones in your body are unbalanced at times. Examples are during menstruation, pregnancy, or menopause. That can lead to a migraine attack. In fact, about three quarters of all women with migraine report that their attacks are related to the menstrual cycle.  Q: Is there an increased risk of stroke for migraine sufferers? A: The likelihood of a migraine attack causing a stroke is very remote. That is not to say that migraine sufferers cannot have a stroke associated with their migraines. In persons under age 48, the most common associated factor for stroke is migraine headache. But over the course of a person's normal life span, the occurrence of migraine headache may actually be associated with a reduced risk of dying from cerebrovascular disease due to stroke.  Q: What are acute medications for migraine? A: Acute medications are used to treat the pain of the headache after it has started. Examples over-the-counter medications, NSAIDs, ergots, and triptans.  Q: What are the triptans? A: Triptans are the newest class of abortive medications.  They are specifically targeted to treat migraine. Triptans are vasoconstrictors. They moderate some chemical reactions in the brain. The triptans work on receptors in your brain. Triptans help to restore the balance of a  neurotransmitter called serotonin. Fluctuations in levels of serotonin are thought to be a main cause of migraine.  Q: Are over-the-counter medications for migraine effective? A: Over-the-counter, or "OTC," medications may be effective in relieving mild to moderate pain and associated symptoms of migraine. But you should see your caregiver before beginning any treatment regimen for migraine.  Q: What are preventive medications for migraine? A: Preventive medications for migraine are sometimes referred to as "prophylactic" treatments. They are used to reduce the frequency, severity, and length of migraine attacks. Examples of preventive medications include antiepileptic medications, antidepressants, beta-blockers, calcium channel blockers, and NSAIDs (nonsteroidal anti-inflammatory drugs). Q: Why are anticonvulsants used to treat migraine? A: During the past few years, there has been an increased interest in antiepileptic drugs for the prevention of migraine. They are sometimes referred to as "anticonvulsants". Both epilepsy and migraine may be caused by similar reactions in the brain.  Q: Why are antidepressants used to treat migraine? A: Antidepressants are typically used to treat people with depression. They may reduce migraine frequency by regulating chemical levels, such as serotonin, in the brain.  Q: What alternative therapies are used to treat migraine? A: The term "alternative therapies" is often used to describe treatments considered outside the scope of conventional Western medicine. Examples of alternative therapy include acupuncture, acupressure, and yoga. Another common alternative treatment is herbal therapy. Some herbs are believed to relieve headache pain. Always discuss alternative therapies with your caregiver before proceeding. Some herbal products contain arsenic and other toxins. TENSION HEADACHES Q: What is a tension-type headache? What causes it? How can I treat it? A:  Tension-type headaches occur randomly. They are often the result of temporary stress, anxiety, fatigue, or anger. Symptoms include soreness in your temples, a tightening band-like sensation around your head (a "vice-like" ache). Symptoms can also include a pulling feeling, pressure sensations, and contracting head and neck muscles. The headache begins in your forehead, temples, or the back of your head and neck. Treatment for tension-type headache may include over-the-counter or prescription medications. Treatment may also include self-help techniques such as relaxation training and biofeedback. CLUSTER HEADACHES Q: What is a cluster headache? What causes it? How can I treat it? A: Cluster headache gets its name because the attacks come in groups. The pain arrives with little, if any, warning. It is usually on one side of the head. A tearing or bloodshot eye and a runny nose on the same side of the headache may also accompany the pain. Cluster headaches are believed to be caused by chemical reactions in the brain. They have been described as the most severe and intense of any headache type. Treatment for cluster headache includes prescription medication and oxygen. SINUS HEADACHES Q: What is a sinus headache? What causes it? How can I treat it? A: When a cavity in the bones of the face and skull (a sinus) becomes inflamed, the inflammation will cause localized pain. This condition is usually the result of an allergic reaction, a tumor, or an infection. If your headache is caused by a sinus blockage, such as an infection, you will probably have a fever. An x-ray will confirm a sinus blockage. Your caregiver's treatment might include antibiotics for the infection, as well as antihistamines or decongestants.  REBOUND HEADACHES Q: What is a rebound  headache? What causes it? How can I treat it? A: A pattern of taking acute headache medications too often can lead to a condition known as "rebound headache." A  pattern of taking too much headache medication includes taking it more than 2 days per week or in excessive amounts. That means more than the label or a caregiver advises. With rebound headaches, your medications not only stop relieving pain, they actually begin to cause headaches. Doctors treat rebound headache by tapering the medication that is being overused. Sometimes your caregiver will gradually substitute a different type of treatment or medication. Stopping may be a challenge. Regularly overusing a medication increases the potential for serious side effects. Consult a caregiver if you regularly use headache medications more than 2 days per week or more than the label advises. ADDITIONAL QUESTIONS AND ANSWERS Q: What is biofeedback? A: Biofeedback is a self-help treatment. Biofeedback uses special equipment to monitor your body's involuntary physical responses. Biofeedback monitors:  Breathing.  Pulse.  Heart rate.  Temperature.  Muscle tension.  Brain activity. Biofeedback helps you refine and perfect your relaxation exercises. You learn to control the physical responses that are related to stress. Once the technique has been mastered, you do not need the equipment any more. Q: Are headaches hereditary? A: Four out of five (80%) of people that suffer report a family history of migraine. Scientists are not sure if this is genetic or a family predisposition. Despite the uncertainty, a child has a 50% chance of having migraine if one parent suffers. The child has a 75% chance if both parents suffer.  Q: Can children get headaches? A: By the time they reach high school, most young people have experienced some type of headache. Many safe and effective approaches or medications can prevent a headache from occurring or stop it after it has begun.  Q: What type of doctor should I see to diagnose and treat my headache? A: Start with your primary caregiver. Discuss his or her experience and  approach to headaches. Discuss methods of classification, diagnosis, and treatment. Your caregiver may decide to recommend you to a headache specialist, depending upon your symptoms or other physical conditions. Having diabetes, allergies, etc., may require a more comprehensive and inclusive approach to your headache. The National Head[employer] Foundation will provide, upon request, a list of [facility] physician members in your state. Document Released: 12/17/2003 Document Revised: 12/19/2011 Document Reviewed: 05/26/2008 [facility] Patient Information 2015 [place], [place]. This information is not intended to replace advice given to you by your health care provider. Make sure you discuss any questions you have with your health care provider.

## 2015-05-25 ENCOUNTER — Other Ambulatory Visit: Payer: Self-pay | Admitting: *Deleted

## 2015-05-25 MED ORDER — MELOXICAM 15 MG PO TABS: 15.0000 mg | ORAL_TABLET | Freq: Every day | ORAL | Status: DC

## 2015-05-25 NOTE — Telephone Encounter (Signed)
Refill request for Meloxicam  15 mg  Last filled by MD on- 06/06/2014 #30 x3 Last Appt: 05/14/2015 Next Appt: 06/29/2015 Please advise refill?

## 2015-06-04 ENCOUNTER — Other Ambulatory Visit
Admission: RE | Admit: 2015-06-04 | Discharge: 2015-06-04 | Disposition: A | Discharge status: Home / Self Care | Payer: Medicare Other | Source: Ambulatory Visit | Attending: Cardiovascular Disease | Admitting: Cardiovascular Disease

## 2015-06-04 DIAGNOSIS — R0602 Shortness of breath: Secondary | ICD-10-CM | POA: Insufficient documentation

## 2015-06-04 DIAGNOSIS — R079 Chest pain, unspecified: Secondary | ICD-10-CM | POA: Diagnosis not present

## 2015-06-04 LAB — CBC WITH DIFFERENTIAL/PLATELET
Basophils Absolute: 0 10*3/uL (ref 0–0.1)
Basophils Relative: 1 %
EOS PCT: 5 %
Eosinophils Absolute: 0.2 10*3/uL (ref 0–0.7)
HEMATOCRIT: 33.2 % — AB (ref 35.0–47.0)
Hemoglobin: 11.1 g/dL — ABNORMAL LOW (ref 12.0–16.0)
LYMPHS PCT: 18 %
Lymphs Abs: 0.9 10*3/uL — ABNORMAL LOW (ref 1.0–3.6)
MCH: 30.8 pg (ref 26.0–34.0)
MCHC: 33.5 g/dL (ref 32.0–36.0)
MCV: 91.9 fL (ref 80.0–100.0)
MONO ABS: 0.3 10*3/uL (ref 0.2–0.9)
Monocytes Relative: 7 %
NEUTROS ABS: 3.3 10*3/uL (ref 1.4–6.5)
Neutrophils Relative %: 69 %
Platelets: 177 10*3/uL (ref 150–440)
RBC: 3.61 MIL/uL — ABNORMAL LOW (ref 3.80–5.20)
RDW: 13.7 % (ref 11.5–14.5)
WBC: 4.8 10*3/uL (ref 3.6–11.0)

## 2015-06-04 LAB — PROTIME-INR
INR: 0.94
Prothrombin Time: 12.8 seconds (ref 11.4–15.0)

## 2015-06-04 LAB — BASIC METABOLIC PANEL
Anion gap: 3 — ABNORMAL LOW (ref 5–15)
BUN: 21 mg/dL — AB (ref 6–20)
CALCIUM: 9.3 mg/dL (ref 8.9–10.3)
CO2: 25 mmol/L (ref 22–32)
CREATININE: 1.38 mg/dL — AB (ref 0.44–1.00)
Chloride: 111 mmol/L (ref 101–111)
GFR calc Af Amer: 42 mL/min — ABNORMAL LOW (ref >60)
GFR calc non Af Amer: 36 mL/min — ABNORMAL LOW (ref >60)
GLUCOSE: 98 mg/dL (ref 65–99)
Potassium: 5.8 mmol/L — ABNORMAL HIGH (ref 3.5–5.1)
Sodium: 139 mmol/L (ref 135–145)

## 2015-06-08 ENCOUNTER — Other Ambulatory Visit: Payer: Self-pay | Admitting: Family Medicine

## 2015-06-08 DIAGNOSIS — M2550 Pain in unspecified joint: Secondary | ICD-10-CM

## 2015-06-08 MED ORDER — HYDROCODONE-ACETAMINOPHEN 10-325 MG PO TABS: 1.0000 | ORAL_TABLET | ORAL | Status: DC | PRN

## 2015-06-08 NOTE — Telephone Encounter (Signed)
Pt contacted office for refill request on the following medications:  HYDROcodone-acetaminophen ([place]) 10-325 MG.  CB#[id number]/MW

## 2015-06-11 ENCOUNTER — Other Ambulatory Visit: Payer: Self-pay | Admitting: Cardiovascular Disease

## 2015-06-11 DIAGNOSIS — I209 Angina pectoris, unspecified: Secondary | ICD-10-CM

## 2015-06-12 ENCOUNTER — Encounter
Admission: RE | Disposition: A | Discharge status: Home / Self Care | Payer: Self-pay | Source: Ambulatory Visit | Attending: Cardiovascular Disease

## 2015-06-12 ENCOUNTER — Other Ambulatory Visit: Payer: Self-pay | Admitting: Cardiovascular Disease

## 2015-06-12 ENCOUNTER — Encounter: Payer: Self-pay | Admitting: *Deleted

## 2015-06-12 ENCOUNTER — Ambulatory Visit
Admission: RE | Admit: 2015-06-12 | Discharge: 2015-06-12 | Disposition: A | Discharge status: Home / Self Care | Payer: Medicare Other | Source: Ambulatory Visit | Attending: Cardiovascular Disease | Admitting: Cardiovascular Disease

## 2015-06-12 DIAGNOSIS — Z7982 Long term (current) use of aspirin: Secondary | ICD-10-CM | POA: Insufficient documentation

## 2015-06-12 DIAGNOSIS — R0789 Other chest pain: Secondary | ICD-10-CM | POA: Diagnosis not present

## 2015-06-12 DIAGNOSIS — I129 Hypertensive chronic kidney disease with stage 1 through stage 4 chronic kidney disease, or unspecified chronic kidney disease: Secondary | ICD-10-CM | POA: Diagnosis not present

## 2015-06-12 DIAGNOSIS — N189 Chronic kidney disease, unspecified: Secondary | ICD-10-CM | POA: Insufficient documentation

## 2015-06-12 DIAGNOSIS — I2510 Atherosclerotic heart disease of native coronary artery without angina pectoris: Secondary | ICD-10-CM | POA: Insufficient documentation

## 2015-06-12 DIAGNOSIS — Z79891 Long term (current) use of opiate analgesic: Secondary | ICD-10-CM | POA: Insufficient documentation

## 2015-06-12 DIAGNOSIS — I4891 Unspecified atrial fibrillation: Secondary | ICD-10-CM | POA: Diagnosis not present

## 2015-06-12 DIAGNOSIS — Z96652 Presence of left artificial knee joint: Secondary | ICD-10-CM | POA: Diagnosis not present

## 2015-06-12 DIAGNOSIS — Z841 Family history of disorders of kidney and ureter: Secondary | ICD-10-CM | POA: Diagnosis not present

## 2015-06-12 DIAGNOSIS — I447 Left bundle-branch block, unspecified: Secondary | ICD-10-CM | POA: Diagnosis not present

## 2015-06-12 DIAGNOSIS — I6522 Occlusion and stenosis of left carotid artery: Secondary | ICD-10-CM | POA: Diagnosis not present

## 2015-06-12 DIAGNOSIS — I471 Supraventricular tachycardia: Secondary | ICD-10-CM | POA: Diagnosis not present

## 2015-06-12 DIAGNOSIS — Z791 Long term (current) use of non-steroidal anti-inflammatories (NSAID): Secondary | ICD-10-CM | POA: Insufficient documentation

## 2015-06-12 DIAGNOSIS — Z7951 Long term (current) use of inhaled steroids: Secondary | ICD-10-CM | POA: Insufficient documentation

## 2015-06-12 DIAGNOSIS — Z809 Family history of malignant neoplasm, unspecified: Secondary | ICD-10-CM | POA: Insufficient documentation

## 2015-06-12 DIAGNOSIS — I209 Angina pectoris, unspecified: Secondary | ICD-10-CM | POA: Diagnosis present

## 2015-06-12 DIAGNOSIS — Z87891 Personal history of nicotine dependence: Secondary | ICD-10-CM | POA: Insufficient documentation

## 2015-06-12 DIAGNOSIS — Z79899 Other long term (current) drug therapy: Secondary | ICD-10-CM | POA: Diagnosis not present

## 2015-06-12 DIAGNOSIS — Z9889 Other specified postprocedural states: Secondary | ICD-10-CM | POA: Insufficient documentation

## 2015-06-12 DIAGNOSIS — R0602 Shortness of breath: Secondary | ICD-10-CM | POA: Insufficient documentation

## 2015-06-12 DIAGNOSIS — Z8614 Personal history of Methicillin resistant Staphylococcus aureus infection: Secondary | ICD-10-CM | POA: Diagnosis not present

## 2015-06-12 DIAGNOSIS — Z7902 Long term (current) use of antithrombotics/antiplatelets: Secondary | ICD-10-CM | POA: Diagnosis not present

## 2015-06-12 DIAGNOSIS — I4719 Other supraventricular tachycardia: Secondary | ICD-10-CM | POA: Diagnosis present

## 2015-06-12 DIAGNOSIS — R262 Difficulty in walking, not elsewhere classified: Secondary | ICD-10-CM | POA: Insufficient documentation

## 2015-06-12 DIAGNOSIS — R9431 Abnormal electrocardiogram [ECG] [EKG]: Secondary | ICD-10-CM | POA: Insufficient documentation

## 2015-06-12 DIAGNOSIS — M1990 Unspecified osteoarthritis, unspecified site: Secondary | ICD-10-CM | POA: Diagnosis not present

## 2015-06-12 HISTORY — PX: CARDIAC CATHETERIZATION: SHX172

## 2015-06-12 SURGERY — LEFT HEART CATH AND CORONARY ANGIOGRAPHY
Anesthesia: Moderate Sedation

## 2015-06-12 SURGERY — LEFT HEART CATH AND CORONARY ANGIOGRAPHY
Anesthesia: Moderate Sedation | Laterality: Bilateral

## 2015-06-12 MED ORDER — FENTANYL CITRATE (PF) 100 MCG/2ML IJ SOLN
INTRAMUSCULAR | Status: AC
  Filled 2015-06-12: qty 2

## 2015-06-12 MED ORDER — IOHEXOL 300 MG/ML  SOLN
INTRAMUSCULAR | Status: DC | PRN
  Administered 2015-06-12: 40 mL via INTRA_ARTERIAL
  Administered 2015-06-12: 100 mL via INTRA_ARTERIAL

## 2015-06-12 MED ORDER — METOPROLOL TARTRATE 1 MG/ML IV SOLN
INTRAVENOUS | Status: AC
  Filled 2015-06-12: qty 5

## 2015-06-12 MED ORDER — METOPROLOL TARTRATE 1 MG/ML IV SOLN
INTRAVENOUS | Status: AC
  Administered 2015-06-12: 5 mg
  Filled 2015-06-12: qty 5

## 2015-06-12 MED ORDER — HYDROCODONE-ACETAMINOPHEN 10-325 MG PO TABS
1.0000 | ORAL_TABLET | Freq: Once | ORAL | Status: AC
Start: 2015-06-12 — End: 2015-06-12
  Administered 2015-06-12: 1 via ORAL

## 2015-06-12 MED ORDER — HEPARIN (PORCINE) IN NACL 2-0.9 UNIT/ML-% IJ SOLN
INTRAMUSCULAR | Status: AC
  Filled 2015-06-12: qty 1000

## 2015-06-12 MED ORDER — SODIUM CHLORIDE 0.9 % WEIGHT BASED INFUSION: 1.0000 mL/kg/h | INTRAVENOUS | Status: DC

## 2015-06-12 MED ORDER — METOPROLOL TARTRATE 1 MG/ML IV SOLN
5.0000 mg | Freq: Once | INTRAVENOUS | Status: AC
  Administered 2015-06-12: 5 mg via INTRAVENOUS

## 2015-06-12 MED ORDER — ASPIRIN 81 MG PO CHEW: 81.0000 mg | CHEWABLE_TABLET | ORAL | Status: DC

## 2015-06-12 MED ORDER — METOPROLOL TARTRATE 50 MG PO TABS
50.0000 mg | ORAL_TABLET | Freq: Once | ORAL | Status: AC
  Administered 2015-06-12: 50 mg via ORAL
  Filled 2015-06-12: qty 1

## 2015-06-12 MED ORDER — OXYCODONE-ACETAMINOPHEN 5-325 MG PO TABS: 1.0000 | ORAL_TABLET | ORAL | Status: DC | PRN

## 2015-06-12 MED ORDER — METOPROLOL TARTRATE 1 MG/ML IV SOLN: INTRAVENOUS | Status: DC | PRN

## 2015-06-12 MED ORDER — MIDAZOLAM HCL 2 MG/2ML IJ SOLN
INTRAMUSCULAR | Status: DC | PRN
Start: 2015-06-12 — End: 2015-06-12
  Administered 2015-06-12 (×2): 0.5 mg via INTRAVENOUS

## 2015-06-12 MED ORDER — SODIUM CHLORIDE 0.9 % WEIGHT BASED INFUSION: 3.0000 mL/kg/h | INTRAVENOUS | Status: DC

## 2015-06-12 MED ORDER — ACETAMINOPHEN 325 MG PO TABS: 650.0000 mg | ORAL_TABLET | ORAL | Status: DC | PRN

## 2015-06-12 MED ORDER — METOPROLOL TARTRATE 1 MG/ML IV SOLN
INTRAVENOUS | Status: AC
  Administered 2015-06-12: 5 mg via INTRAVENOUS
  Filled 2015-06-12: qty 5

## 2015-06-12 MED ORDER — MIDAZOLAM HCL 2 MG/2ML IJ SOLN
INTRAMUSCULAR | Status: AC
  Filled 2015-06-12: qty 2

## 2015-06-12 MED ORDER — METOPROLOL TARTRATE 50 MG PO TABS: 50.0000 mg | ORAL_TABLET | Freq: Two times a day (BID) | ORAL | Status: DC

## 2015-06-12 MED ORDER — ONDANSETRON HCL 4 MG/2ML IJ SOLN: 4.0000 mg | Freq: Four times a day (QID) | INTRAMUSCULAR | Status: DC | PRN

## 2015-06-12 MED ORDER — FENTANYL CITRATE (PF) 100 MCG/2ML IJ SOLN
INTRAMUSCULAR | Status: DC | PRN
  Administered 2015-06-12 (×2): 25 ug via INTRAVENOUS

## 2015-06-12 SURGICAL SUPPLY — 11 items
CATH INFINITI 5FR ANG PIGTAIL (CATHETERS) ×3 IMPLANT
CATH INFINITI 5FR JL4 (CATHETERS) ×3 IMPLANT
CATH INFINITI JR4 5F (CATHETERS) ×3 IMPLANT
KIT MANI 3VAL PERCEP (MISCELLANEOUS) ×3 IMPLANT
NEEDLE PERC 18GX7CM (NEEDLE) ×3 IMPLANT
NEEDLE SMART 18G ACCESS (NEEDLE) ×2 IMPLANT
PACK CARDIAC CATH (CUSTOM PROCEDURE TRAY) ×3 IMPLANT
SHEATH AVANTI 5FR X 11CM (SHEATH) ×3 IMPLANT
WIRE EMERALD 3MM-J .035X150CM (WIRE) ×3 IMPLANT
WIRE EMERALD 3MM-J .035X260CM (WIRE) ×3 IMPLANT
WIRE HITORQ VERSACORE ST 145CM (WIRE) ×2 IMPLANT

## 2015-06-12 NOTE — Progress Notes (Signed)
Dr. Rockey Situ here and notified that heart rate is in sustained pattern again of 112-117.Rockey Situ at bedside   Metoprolol  Was given IV as ordered.  Will repeat again in 5 mins if rate has not changed.  Metoprolol oral also will be given as ordered.Marland Kitchen

## 2015-06-12 NOTE — Discharge Instructions (Signed)

## 2015-06-12 NOTE — Progress Notes (Signed)
At 8:15 Patient went into sinus tach--rate 120's  Dr. Rockey Situ in attendance  Metoprolol 5 mgm  given at 0815 as ordered.  12-lead EKG done.  Rhythm continues at 120  Dr. Rockey Situ orders second Metoprolol 5 mgm IV at 08:20.  Patient states feels chest racing, but BP holds steady at 148/81.  Patient tolerates increased rhythm very well.  At 08:30 rhythm breaks to patients normal rhythm--sinus arrythmia, rate in the 70's.

## 2015-06-18 ENCOUNTER — Other Ambulatory Visit: Payer: Self-pay

## 2015-06-18 DIAGNOSIS — F419 Anxiety disorder, unspecified: Secondary | ICD-10-CM

## 2015-06-18 MED ORDER — SERTRALINE HCL 50 MG PO TABS: ORAL_TABLET | ORAL | Status: DC

## 2015-06-18 NOTE — Telephone Encounter (Signed)
Last refilled 11/20/2014 #30 with 6 refills.

## 2015-06-24 ENCOUNTER — Other Ambulatory Visit: Payer: Self-pay | Admitting: *Deleted

## 2015-06-24 MED ORDER — TRAZODONE HCL 100 MG PO TABS: ORAL_TABLET | ORAL | Status: DC

## 2015-06-24 NOTE — Telephone Encounter (Signed)
Refill request for Trazodone HCL 100 mg 1/2-1 tab qd Last filled by MD on- 06/13/2014 #30 x6 Last Appt: 05/13/2015  Next Appt: 06/29/2015 Please advise refill?

## 2015-06-29 ENCOUNTER — Ambulatory Visit (INDEPENDENT_AMBULATORY_CARE_PROVIDER_SITE_OTHER): Payer: Medicare Other | Admitting: Family Medicine

## 2015-06-29 ENCOUNTER — Encounter: Payer: Self-pay | Admitting: Family Medicine

## 2015-06-29 ENCOUNTER — Ambulatory Visit: Payer: Self-pay | Admitting: Cardiovascular Disease

## 2015-06-29 VITALS — BP 150/70 | HR 65 | Temp 98.1°F | Resp 16 | Wt 193.0 lb

## 2015-06-29 DIAGNOSIS — M7989 Other specified soft tissue disorders: Secondary | ICD-10-CM | POA: Diagnosis not present

## 2015-06-29 DIAGNOSIS — J449 Chronic obstructive pulmonary disease, unspecified: Secondary | ICD-10-CM

## 2015-06-29 DIAGNOSIS — I6523 Occlusion and stenosis of bilateral carotid arteries: Secondary | ICD-10-CM

## 2015-06-29 DIAGNOSIS — Z23 Encounter for immunization: Secondary | ICD-10-CM | POA: Diagnosis not present

## 2015-06-29 DIAGNOSIS — B9562 Methicillin resistant Staphylococcus aureus infection as the cause of diseases classified elsewhere: Secondary | ICD-10-CM

## 2015-06-29 DIAGNOSIS — L0390 Cellulitis, unspecified: Secondary | ICD-10-CM

## 2015-06-29 MED ORDER — DOXYCYCLINE HYCLATE 100 MG PO TABS: 100.0000 mg | ORAL_TABLET | Freq: Two times a day (BID) | ORAL | Status: AC

## 2015-06-29 MED ORDER — FLUTICASONE-SALMETEROL 250-50 MCG/DOSE IN AEPB: 1.0000 | INHALATION_SPRAY | Freq: Two times a day (BID) | RESPIRATORY_TRACT | Status: DC

## 2015-06-29 NOTE — Progress Notes (Signed)
Patient: Carolyn Hendricks Female    DOB: 05-10-1936   79 y.o.   MRN: [id number] Visit Date: 06/29/2015  Today's Provider: [name], MD   Chief Complaint  Patient presents with  . Shortness of Breath    follow up   Subjective:    Shortness of Breath This is a recurrent problem. The problem has been gradually improving. Associated symptoms include headaches, leg pain, leg swelling, PND, a rash (several spots that are all over her body), sputum production (clear colored) and wheezing. Pertinent negatives include no chest pain, fever, rhinorrhea, sore throat, swollen glands, syncope or vomiting. Exacerbated by: exertion. Treatments tried: Advair Diskus 250-50. The treatment provided mild relief.  Patient was last seen 1 month ago for dyspnea on exertion and Emphysema. Patient was started on Advair Diskus and chest x ray was ordered. Patient was advised to follow up with Dr. Rockey Situ due to elevated BNP. Today patient comes in stating she has seen Dr. Rockey Situ and has cardiac catheterization. Patient states her breathing has improved significantly since starting Advair.    Dr. Rockey Situ ordered Maryln Manuel for her LE swelling which she reports is working fairly well.  Skin Lesion She has had skin lesion on her neck and across her left side for several weeks. She has been using mupiricin which seems to have kept lesions from spreading, but have not gotten them to clear. She reports doxycycline worked well in the past when she was diagneosed with MRSA.     Allergies  Allergen Reactions  . Atorvastatin     Other reaction(s): Muscle Pain  . Crestor  [Rosuvastatin Calcium]     Other reaction(s): Muscle Pain  . Pravastatin Sodium     Muscle pain   Previous Medications   ASPIRIN 81 MG TABLET    Take 81 mg by mouth daily.   CETIRIZINE (ZYRTEC) 10 MG TABLET    Take 10 mg by mouth daily.   CLOPIDOGREL (PLAVIX) 75 MG TABLET    Take 75 mg by mouth daily.   CYANOCOBALAMIN (VITAMIN B-12 PO)    Take  by mouth daily.   FLUTICASONE (FLONASE) 50 MCG/ACT NASAL SPRAY    Place 1-2 sprays into both nostrils daily.   FLUTICASONE-SALMETEROL (ADVAIR DISKUS) 250-50 MCG/DOSE AEPB    Inhale 1 puff into the lungs every 12 (twelve) hours.   FUROSEMIDE (LASIX) 20 MG TABLET    Take 1 tablet (20 mg total) by mouth daily.   HYDROCODONE-ACETAMINOPHEN (NORCO) 10-325 MG PER TABLET    Take 1 tablet by mouth every 4 (four) hours as needed.   ISOSORBIDE MONONITRATE (IMDUR) 30 MG 24 HR TABLET    Take 1 tablet (30 mg total) by mouth daily.   LEVOCETIRIZINE (XYZAL) 5 MG TABLET    Take 5 mg by mouth every evening.   LEVOTHYROXINE (SYNTHROID, LEVOTHROID) 75 MCG TABLET    Take 1 tablet (75 mcg total) by mouth daily.   LORAZEPAM (ATIVAN) 0.5 MG TABLET    Take 0.5 mg by mouth every 8 (eight) hours.   MELOXICAM (MOBIC) 15 MG TABLET    Take 1 tablet (15 mg total) by mouth daily. As needed for arthritis pain   METOPROLOL TARTRATE (LOPRESSOR) 50 MG TABLET    Take 1 tablet (50 mg total) by mouth 2 (two) times daily.   MULTIPLE VITAMIN (MULTI-VITAMIN DAILY PO)    Take by mouth.   MUPIROCIN OINTMENT (BACTROBAN) 2 %    Apply 1 application topically 3 (three)  times daily.    NITROGLYCERIN (NITROSTAT) 0.4 MG SL TABLET    Place 1 tablet (0.4 mg total) under the tongue every 5 (five) minutes as needed for chest pain.   SERTRALINE (ZOLOFT) 50 MG TABLET    0.5 to 1 tablet daily   SIMVASTATIN (ZOCOR) 10 MG TABLET    Take 10 mg by mouth daily at 6 PM.    TRAZODONE (DESYREL) 100 MG TABLET    1/2-1 tablet at bedtime as needed   VALSARTAN-HYDROCHLOROTHIAZIDE (DIOVAN-HCT) 320-12.5 MG PER TABLET    Take 1 tablet by mouth daily.    Review of Systems  Constitutional: Negative for fever.  HENT: Negative for rhinorrhea and sore throat.   Respiratory: Positive for sputum production (clear colored), shortness of breath and wheezing.   Cardiovascular: Positive for leg swelling and PND. Negative for chest pain and syncope.  Gastrointestinal:  Negative for vomiting.  Skin: Positive for rash (several spots that are all over her body).  Neurological: Positive for headaches.    Social History  Substance Use Topics  . Smoking status: Former Smoker -- 1.00 packs/day for 26 years    Types: Cigarettes    Quit date: 10/11/2011  . Smokeless tobacco: Not on file     Comment: Patient has smoked for 26 years; has quit but now started back 0.25 ppw.  . Alcohol Use: No   Objective:   BP 150/70 mmHg  Pulse 65  Temp(Src) 98.1 F (36.7 C) (Oral)  Resp 16  Wt 193 lb (87.544 kg)  SpO2 91%  Physical Exam   General Appearance:    Alert, cooperative, no distress  Eyes:    PERRL, conjunctiva/corneas clear, EOM's intact       Lungs:     Clear to auscultation bilaterally, respirations unlabored  Heart:    Regular rate and rhythm, II/VI systolic murmur  Neurologic:   Awake, alert, oriented x 3. No apparent focal neurological           defect.   Skin:  Small patches or erythema on right side of neck and left flank.        Assessment & Plan:     1. Chronic obstructive pulmonary disease, unspecified COPD, unspecified chronic bronchitis type Doing much better with addition of Advair. Continue current medications.   - Fluticasone-Salmeterol (ADVAIR DISKUS) 250-50 MCG/DOSE AEPB; Inhale 1 puff into the lungs every 12 (twelve) hours.  Dispense: 60 each; Refill: 12  2. MRSA cellulitis  - doxycycline (VIBRA-TABS) 100 MG tablet; Take 1 tablet (100 mg total) by mouth 2 (two) times daily.  Dispense: 20 tablet; Refill: 0  3. Leg swelling Improved with [facility].   4. Need for influenza vaccination  - Flu vaccine HIGH DOSE PF       [name], MD  [facility]

## 2015-07-06 ENCOUNTER — Ambulatory Visit (INDEPENDENT_AMBULATORY_CARE_PROVIDER_SITE_OTHER): Payer: Medicare Other | Admitting: Cardiovascular Disease

## 2015-07-06 ENCOUNTER — Encounter: Payer: Self-pay | Admitting: Cardiovascular Disease

## 2015-07-06 VITALS — BP 148/86 | HR 66 | Ht 63.0 in | Wt 192.5 lb

## 2015-07-06 DIAGNOSIS — I6529 Occlusion and stenosis of unspecified carotid artery: Secondary | ICD-10-CM

## 2015-07-06 DIAGNOSIS — R0602 Shortness of breath: Secondary | ICD-10-CM

## 2015-07-06 DIAGNOSIS — I4719 Other supraventricular tachycardia: Secondary | ICD-10-CM

## 2015-07-06 DIAGNOSIS — E785 Hyperlipidemia, unspecified: Secondary | ICD-10-CM

## 2015-07-06 DIAGNOSIS — Z9889 Other specified postprocedural states: Secondary | ICD-10-CM

## 2015-07-06 DIAGNOSIS — I209 Angina pectoris, unspecified: Secondary | ICD-10-CM

## 2015-07-06 DIAGNOSIS — I471 Supraventricular tachycardia: Secondary | ICD-10-CM

## 2015-07-06 DIAGNOSIS — I6523 Occlusion and stenosis of bilateral carotid arteries: Secondary | ICD-10-CM | POA: Diagnosis not present

## 2015-07-06 DIAGNOSIS — R079 Chest pain, unspecified: Secondary | ICD-10-CM | POA: Diagnosis not present

## 2015-07-06 DIAGNOSIS — I771 Stricture of artery: Secondary | ICD-10-CM

## 2015-07-06 DIAGNOSIS — I739 Peripheral vascular disease, unspecified: Secondary | ICD-10-CM

## 2015-07-06 MED ORDER — SIMVASTATIN 20 MG PO TABS: 20.0000 mg | ORAL_TABLET | Freq: Every day | ORAL | Status: DC

## 2015-07-06 NOTE — Assessment & Plan Note (Signed)
Recommended she increase her simvastatin up to 20 mg daily Goal LDL less than 70

## 2015-07-06 NOTE — Progress Notes (Signed)
Patient ID: Carolyn Hendricks, female    DOB: June 21, 1936, 79 y.o.   MRN: [id number]  HPI Comments: Carolyn Hendricks is a 79 year old woman with history of hypertension, MRSA, long smoking history for 20 years, arthritis, presented to the emergency room May 26th 2013 with a buttock pain from abscess, also with malaise, nausea vomiting, fever. Cardiology consult in for atrial tachycardia, heart rate of 150 beats per minute. Heart rate resolved with calcium channel blocker. Noted to have left carotid bruit with ultrasound showing 75% plus left carotid stenosis. Now status post CEA on the left  episodes of tachycardia lasting for hours at a time dating back many years Last office visit reporting chest pain concerning for angina, had cardiac catheterization showing no significant coronary artery disease She does have severe right common and external iliac disease She presents today for follow-up after her procedure  We increased metoprolol up to 50 mg twice a day. She was noted to have numerous runs of atrial tachycardia in the holding area before  the cardiac catheterization. In follow-up today, she reports that she has had significantly improved symptoms on the higher dose metoprolol.  Unclear if she is continuing to have breakthrough episodes, overall she says she is better and prefers not to take more medication. No recent carotid ultrasound. She's not had follow-up with vascular surgery Chronic back pain limiting her exercise and weight loss  EKG on today's visit shows normal sinus rhythm with rate 66 bpm, left bundle branch block    other past medical history  She was started on Lasix 20 mg daily by Dr. Caryn Section for leg edema. Dosing was increased up to 20 mg twice a day by kidney physician, Dr. Abigail Butts. Lab work from 04/27/2015 shows creatinine 1.66, BUN 30 which is well above her baseline  leg swelling on diltiazem , improved by holding the medication   angiography of her carotid 02/20/2014 showing  severe disease on the left.   possible TIA , evaluation in the hospital, discharge 02/20/2014. She had dizziness, lightheadedness, bradycardia. She was continued on her beta blocker, Cardizem was held for bradycardia  Echocardiogram performed 02/17/2014 while she was in the hospital. This showed normal ejection fraction, diastolic dysfunction, mild MR and TR She stopped smoking approximately several years ago  She is tolerating simvastatin 10 mg daily with no side effects. She reports having side effects on Crestor and Lipitor.  She has  seen the renal service and diagnosed with stage III chronic kidney disease.  Echocardiogram in the hospital 03/04/2012 showed normal LV systolic function, diastolic dysfunction, otherwise normal study     Allergies  Allergen Reactions  . Atorvastatin     Other reaction(s): Muscle Pain  . Crestor  [Rosuvastatin Calcium]     Other reaction(s): Muscle Pain  . Pravastatin Sodium     Muscle pain    Current Outpatient Prescriptions on File Prior to Visit  Medication Sig Dispense Refill  . aspirin 81 MG tablet Take 81 mg by mouth daily.    . cetirizine (ZYRTEC) 10 MG tablet Take 10 mg by mouth daily.    . clopidogrel (PLAVIX) 75 MG tablet Take 75 mg by mouth daily.    . Cyanocobalamin (VITAMIN B-12 PO) Take by mouth daily.    Marland Kitchen doxycycline (VIBRA-TABS) 100 MG tablet Take 1 tablet (100 mg total) by mouth 2 (two) times daily. 20 tablet 0  . fluticasone (FLONASE) 50 MCG/ACT nasal spray Place 1-2 sprays into both nostrils daily.    . Fluticasone-Salmeterol (  ADVAIR DISKUS) 250-50 MCG/DOSE AEPB Inhale 1 puff into the lungs every 12 (twelve) hours. 60 each 12  . furosemide (LASIX) 20 MG tablet Take 1 tablet (20 mg total) by mouth daily. 30 tablet 0  . HYDROcodone-acetaminophen (NORCO) 10-325 MG per tablet Take 1 tablet by mouth every 4 (four) hours as needed. 180 tablet 0  . isosorbide mononitrate (IMDUR) 30 MG 24 hr tablet Take 1 tablet (30 mg total) by mouth  daily. 30 tablet 11  . levocetirizine (XYZAL) 5 MG tablet Take 5 mg by mouth every evening.    Marland Kitchen levothyroxine (SYNTHROID, LEVOTHROID) 75 MCG tablet Take 1 tablet (75 mcg total) by mouth daily. 30 tablet 6  . LORazepam (ATIVAN) 0.5 MG tablet Take 0.5 mg by mouth every 8 (eight) hours.    . meloxicam (MOBIC) 15 MG tablet Take 1 tablet (15 mg total) by mouth daily. As needed for arthritis pain 30 tablet 12  . metoprolol tartrate (LOPRESSOR) 50 MG tablet Take 1 tablet (50 mg total) by mouth 2 (two) times daily. 180 tablet 3  . Multiple Vitamin (MULTI-VITAMIN DAILY PO) Take by mouth.    . mupirocin ointment (BACTROBAN) 2 % Apply 1 application topically 3 (three) times daily.     . nitroGLYCERIN (NITROSTAT) 0.4 MG SL tablet Place 1 tablet (0.4 mg total) under the tongue every 5 (five) minutes as needed for chest pain. 25 tablet 3  . sertraline (ZOLOFT) 50 MG tablet 0.5 to 1 tablet daily 30 tablet 6  . traZODone (DESYREL) 100 MG tablet 1/2-1 tablet at bedtime as needed 30 tablet 5  . valsartan-hydrochlorothiazide (DIOVAN-HCT) 320-12.5 MG per tablet Take 1 tablet by mouth daily.     No current facility-administered medications on file prior to visit.    Past Medical History  Diagnosis Date  . Heart murmur   . Head ache   . Chest pain   . Shortness of breath   . Dizziness   . High blood pressure   . Arthritis   . History of MRSA infection   . Chronic kidney disease   . CHF (congestive heart failure)     Past Surgical History  Procedure Laterality Date  . Carotid endarterectomy Left Dr. Lucky Cowboy  . Knee replacement (other) Left 2002  . Foot and ankle surgery    . Appendectomy  1950  . Cardiac catheterization N/A 06/12/2015    Procedure: Left Heart Cath and Coronary Angiography;  Surgeon: Minna Merritts, MD;  Location: [facility];  Service: Cardiovascular;  Laterality: N/A;    Social History  reports that she quit smoking about 3 years ago. Her smoking use included Cigarettes. She  has a 26 pack-year smoking history. She does not have any smokeless tobacco history on file. She reports that she does not drink alcohol or use illicit drugs.  Family History family history includes Cancer in her brother; Congestive Heart Failure (age of onset: 81) in her mother; Kidney failure in her father.        Review of Systems  Constitutional: Negative.   Respiratory: Negative.   Cardiovascular: Negative.   Gastrointestinal: Negative.   Musculoskeletal: Positive for back pain and gait problem.  Skin: Negative.   Neurological: Negative.   Hematological: Negative.   Psychiatric/Behavioral: Negative.   All other systems reviewed and are negative.  BP 148/86 mmHg  Pulse 66  Ht _0  (1.6 m)  Wt 192 lb 8 oz (87.317 kg)  BMI 34.11 kg/m2  Physical Exam  Constitutional: She  is oriented to person, place, and time. She appears well-developed and well-nourished.  HENT:  Head: Normocephalic.  Nose: Nose normal.  Mouth/Throat: Oropharynx is clear and moist.  Eyes: Conjunctivae are normal. Pupils are equal, round, and reactive to light.  Neck: Normal range of motion. Neck supple. No JVD present. Carotid bruit is present.  Cardiovascular: Normal rate, regular rhythm, S1 normal, S2 normal, normal heart sounds and intact distal pulses.  Exam reveals no gallop and no friction rub.   No murmur heard. Pulmonary/Chest: Effort normal. No respiratory distress. She has decreased breath sounds. She has no wheezes. She has no rales. She exhibits no tenderness.  Abdominal: Soft. Bowel sounds are normal. She exhibits no distension. There is no tenderness.  Musculoskeletal: Normal range of motion. She exhibits no edema or tenderness.  Lymphadenopathy:    She has no cervical adenopathy.  Neurological: She is alert and oriented to person, place, and time. Coordination normal.  Skin: Skin is warm and dry. No rash noted. No erythema.  Psychiatric: She has a normal mood and affect. Her behavior is  normal. Judgment and thought content normal.    Assessment and Plan  Nursing note and vitals reviewed.

## 2015-07-06 NOTE — Assessment & Plan Note (Signed)
We have encouraged her to stay on metoprolol 50 mg twice a day Suggested she take metoprolol 25 mg for any breakthrough arrhythmia If she continues to have episodes, we will increase the metoprolol up to 75 mg twice a day

## 2015-07-06 NOTE — Assessment & Plan Note (Signed)
Recent episodes of chest tightness likely secondary to her underlying atrial tachycardia. We'll work on heart rate control, rhythm control with beta blocker for symptom relief

## 2015-07-06 NOTE — Assessment & Plan Note (Signed)
She declines follow-up with Dr. Lucky Cowboy. She has indicated she would like to have ultrasound done through our office. This will be arranged at her request. Carotid endarterectomy on the left, 50-69% on the right

## 2015-07-06 NOTE — Patient Instructions (Signed)
You are doing well.  Please increase the simvastatin up to 20 mg daily  We will order a carotid ultrasound (for hx of CEA and carotid stenosis) Will will check lower extremity arterial doppler (iliac artery stenosis on cardiac cath, on the right)  Please take extra metoprolol 25 mg as needed for tachycardia/palpitations  Please call us if you have new issues that need to be addressed before your next appt.  Your physician wants you to follow-up in: 6 months.  You will receive a reminder letter in the mail two months in advance. If you don't receive a letter, please call our office to schedule the follow-up appointment.

## 2015-07-06 NOTE — Assessment & Plan Note (Signed)
Significant right iliac arterial disease on catheterization. We have suggested a lower extremity arterial Doppler to rule out hemodynamically significant stenosis

## 2015-07-08 ENCOUNTER — Other Ambulatory Visit: Payer: Self-pay | Admitting: Family Medicine

## 2015-07-08 ENCOUNTER — Other Ambulatory Visit: Payer: Self-pay | Admitting: Cardiovascular Disease

## 2015-07-08 DIAGNOSIS — I739 Peripheral vascular disease, unspecified: Secondary | ICD-10-CM

## 2015-07-08 DIAGNOSIS — M2550 Pain in unspecified joint: Secondary | ICD-10-CM

## 2015-07-08 MED ORDER — HYDROCODONE-ACETAMINOPHEN 10-325 MG PO TABS: 1.0000 | ORAL_TABLET | ORAL | Status: DC | PRN

## 2015-07-08 NOTE — Telephone Encounter (Signed)
Pt's daughter Hilda Blades is requesting a refill on pt's HYDROcodone-acetaminophen (NORCO) 10-325 MG per tablet. Thanks TNP

## 2015-07-17 ENCOUNTER — Ambulatory Visit (INDEPENDENT_AMBULATORY_CARE_PROVIDER_SITE_OTHER): Payer: Medicare Other

## 2015-07-17 DIAGNOSIS — Z9889 Other specified postprocedural states: Secondary | ICD-10-CM

## 2015-07-17 DIAGNOSIS — R079 Chest pain, unspecified: Secondary | ICD-10-CM

## 2015-07-17 DIAGNOSIS — I6523 Occlusion and stenosis of bilateral carotid arteries: Secondary | ICD-10-CM

## 2015-07-17 DIAGNOSIS — I739 Peripheral vascular disease, unspecified: Secondary | ICD-10-CM

## 2015-07-17 DIAGNOSIS — I771 Stricture of artery: Secondary | ICD-10-CM

## 2015-07-17 DIAGNOSIS — R0602 Shortness of breath: Secondary | ICD-10-CM

## 2015-07-17 DIAGNOSIS — I6529 Occlusion and stenosis of unspecified carotid artery: Secondary | ICD-10-CM | POA: Diagnosis not present

## 2015-07-21 ENCOUNTER — Other Ambulatory Visit: Payer: Self-pay

## 2015-07-21 DIAGNOSIS — I6529 Occlusion and stenosis of unspecified carotid artery: Secondary | ICD-10-CM

## 2015-07-27 DIAGNOSIS — N2581 Secondary hyperparathyroidism of renal origin: Secondary | ICD-10-CM | POA: Diagnosis not present

## 2015-07-27 DIAGNOSIS — N183 Chronic kidney disease, stage 3 (moderate): Secondary | ICD-10-CM | POA: Diagnosis not present

## 2015-07-27 DIAGNOSIS — I10 Essential (primary) hypertension: Secondary | ICD-10-CM | POA: Diagnosis not present

## 2015-07-27 DIAGNOSIS — E785 Hyperlipidemia, unspecified: Secondary | ICD-10-CM | POA: Diagnosis not present

## 2015-08-06 ENCOUNTER — Telehealth: Payer: Self-pay | Admitting: Family Medicine

## 2015-08-06 DIAGNOSIS — M2550 Pain in unspecified joint: Secondary | ICD-10-CM

## 2015-08-06 MED ORDER — HYDROCODONE-ACETAMINOPHEN 10-325 MG PO TABS: 1.0000 | ORAL_TABLET | ORAL | Status: DC | PRN

## 2015-08-06 NOTE — Telephone Encounter (Signed)
Pt contacted office for refill request on the following medications:  HYDROcodone-acetaminophen ([place]) 10-325 MG.  CB#[phone]/MW

## 2015-08-06 NOTE — Telephone Encounter (Signed)
LOV was 07/06/15 and last fill of Norco was 07/08/15, please review-aa

## 2015-09-07 ENCOUNTER — Other Ambulatory Visit: Payer: Self-pay | Admitting: Family Medicine

## 2015-09-07 DIAGNOSIS — M2550 Pain in unspecified joint: Secondary | ICD-10-CM

## 2015-09-07 MED ORDER — HYDROCODONE-ACETAMINOPHEN 10-325 MG PO TABS: 1.0000 | ORAL_TABLET | ORAL | Status: DC | PRN

## 2015-09-07 NOTE — Telephone Encounter (Signed)
Pt needs refill HYDROcodone-acetaminophen (NORCO) 10-325 MG tablet   Thank sTeri

## 2015-09-08 ENCOUNTER — Encounter (INDEPENDENT_AMBULATORY_CARE_PROVIDER_SITE_OTHER): Payer: Self-pay

## 2015-09-08 ENCOUNTER — Ambulatory Visit (INDEPENDENT_AMBULATORY_CARE_PROVIDER_SITE_OTHER): Payer: Medicare Other | Admitting: Cardiovascular Disease

## 2015-09-08 ENCOUNTER — Encounter: Payer: Self-pay | Admitting: Cardiovascular Disease

## 2015-09-08 VITALS — BP 120/68 | HR 60 | Ht 63.0 in | Wt 187.2 lb

## 2015-09-08 DIAGNOSIS — I6523 Occlusion and stenosis of bilateral carotid arteries: Secondary | ICD-10-CM | POA: Diagnosis not present

## 2015-09-08 DIAGNOSIS — I739 Peripheral vascular disease, unspecified: Secondary | ICD-10-CM

## 2015-09-08 NOTE — Assessment & Plan Note (Signed)
Status post left CEA and moderate right ICA stenosis. This will be followed with surveillance carotid Doppler. Continue aggressive treatment of risk factors.

## 2015-09-08 NOTE — Progress Notes (Signed)
Primary care physician: Dr. Caryn Section. Primary cardiologist: Dr. Rockey Situ  HPI   Carolyn Hendricks is a 79 year old woman who was referred by Dr. Deidre Ala for evaluation of peripheral arterial disease. She has extensive medical problems that include hypertension, MRSA, long smoking history for 20 years, arthritis,  atrial tachycardia, bilateral carotid artery disease status post left carotid endarterectomy, chronic kidney disease, and peripheral arterial disease.  The patient underwent cardiac catheterization in September for chest pain which showed no significant obstructive coronary artery disease. However, she was noted to have right common iliac artery stenosis on angiography.  The patient underwent noninvasive cardiac evaluation which showed normal ABI bilaterally. Duplex showed significant bilateral common iliac artery stenosis worse on the right side. The patient's mobility is somewhat limited due to poor balance. She walks with a cane. It is hard to tell how much claudication she has. She has vague symptoms of leg weakness bilaterally with some numbness.    Allergies  Allergen Reactions  . Atorvastatin     Other reaction(s): Muscle Pain  . Crestor  [Rosuvastatin Calcium]     Other reaction(s): Muscle Pain  . Pravastatin Sodium     Muscle pain     Current Outpatient Prescriptions on File Prior to Visit  Medication Sig Dispense Refill  . aspirin 81 MG tablet Take 81 mg by mouth daily.    . cetirizine (ZYRTEC) 10 MG tablet Take 10 mg by mouth daily.    . clopidogrel (PLAVIX) 75 MG tablet Take 75 mg by mouth daily.    . Cyanocobalamin (VITAMIN B-12 PO) Take by mouth daily.    . fluticasone (FLONASE) 50 MCG/ACT nasal spray Place 1-2 sprays into both nostrils daily.    . Fluticasone-Salmeterol (ADVAIR DISKUS) 250-50 MCG/DOSE AEPB Inhale 1 puff into the lungs every 12 (twelve) hours. 60 each 12  . furosemide (LASIX) 20 MG tablet Take 1 tablet (20 mg total) by mouth daily. 30 tablet 0  .  HYDROcodone-acetaminophen (NORCO) 10-325 MG tablet Take 1 tablet by mouth every 4 (four) hours as needed. 180 tablet 0  . isosorbide mononitrate (IMDUR) 30 MG 24 hr tablet Take 1 tablet (30 mg total) by mouth daily. 30 tablet 11  . levocetirizine (XYZAL) 5 MG tablet Take 5 mg by mouth every evening.    Marland Kitchen levothyroxine (SYNTHROID, LEVOTHROID) 75 MCG tablet Take 1 tablet (75 mcg total) by mouth daily. 30 tablet 6  . LORazepam (ATIVAN) 0.5 MG tablet Take 0.5 mg by mouth every 8 (eight) hours.    . meloxicam (MOBIC) 15 MG tablet Take 1 tablet (15 mg total) by mouth daily. As needed for arthritis pain 30 tablet 12  . metoprolol tartrate (LOPRESSOR) 50 MG tablet Take 1 tablet (50 mg total) by mouth 2 (two) times daily. 180 tablet 3  . Multiple Vitamin (MULTI-VITAMIN DAILY PO) Take by mouth.    . mupirocin ointment (BACTROBAN) 2 % Apply 1 application topically 3 (three) times daily.     . nitroGLYCERIN (NITROSTAT) 0.4 MG SL tablet Place 1 tablet (0.4 mg total) under the tongue every 5 (five) minutes as needed for chest pain. 25 tablet 3  . sertraline (ZOLOFT) 50 MG tablet 0.5 to 1 tablet daily 30 tablet 6  . simvastatin (ZOCOR) 20 MG tablet Take 1 tablet (20 mg total) by mouth daily at 6 PM. 90 tablet 3  . traZODone (DESYREL) 100 MG tablet 1/2-1 tablet at bedtime as needed 30 tablet 5  . valsartan-hydrochlorothiazide (DIOVAN-HCT) 320-12.5 MG per tablet Take 1 tablet by  mouth daily.     No current facility-administered medications on file prior to visit.     Past Medical History  Diagnosis Date  . Heart murmur   . Head ache   . Chest pain   . Shortness of breath   . Dizziness   . High blood pressure   . Arthritis   . History of MRSA infection   . Chronic kidney disease   . CHF (congestive heart failure) [facility])      Past Surgical History  Procedure Laterality Date  . Carotid endarterectomy Left Dr. Lucky Cowboy  . Knee replacement (other) Left 2002  . Foot and ankle surgery    . Appendectomy   1950  . Cardiac catheterization N/A 06/12/2015    Procedure: Left Heart Cath and Coronary Angiography;  Surgeon: Minna Merritts, MD;  Location: [place] [facility];  Service: Cardiovascular;  Laterality: N/A;     Family History  Problem Relation Age of Onset  . Congestive Heart Failure Mother 28  . Cancer Brother   . Kidney failure Father      Social History   Social History  . Marital Status: Widowed    Spouse Name: N/A  . Number of Children: N/A  . Years of Education: 12   Occupational History  . retired    Social History Main Topics  . Smoking status: Former Smoker -- 1.00 packs/day for 26 years    Types: Cigarettes    Quit date: 10/11/2011  . Smokeless tobacco: Not on file     Comment: Patient has smoked for 26 years; has quit but now started back 0.25 ppw.  . Alcohol Use: No  . Drug Use: No  . Sexual Activity: Not on file   Other Topics Concern  . Not on file   Social History Narrative     ROS A 10 point review of system was performed. It is negative other than that mentioned in the history of present illness.   PHYSICAL EXAM   BP 120/68 mmHg  Pulse 60  Ht 5' 3" (1.6 m)  Wt 187 lb 4 oz (84.936 kg)  BMI 33.18 kg/m2 Constitutional: She is oriented to person, place, and time. She appears well-developed and well-nourished. No distress.  HENT: No nasal discharge.  Head: Normocephalic and atraumatic.  Eyes: Pupils are equal and round. No discharge.  Neck: Normal range of motion. Neck supple. No JVD present. No thyromegaly present.  Cardiovascular: Normal rate, regular rhythm, normal heart sounds. Exam reveals no gallop and no friction rub. No murmur heard.  Pulmonary/Chest: Effort normal and breath sounds normal. No stridor. No respiratory distress. She has no wheezes. She has no rales. She exhibits no tenderness.  Abdominal: Soft. Bowel sounds are normal. She exhibits no distension. There is no tenderness. There is no rebound and no guarding.    Musculoskeletal: Normal range of motion. She exhibits +1 edema and no tenderness.  Neurological: She is alert and oriented to person, place, and time. Coordination normal.  Skin: Skin is warm and dry. No rash noted. She is not diaphoretic. No erythema. No pallor.  Psychiatric: She has a normal mood and affect. Her behavior is normal. Judgment and thought content normal.   Distal pulses are not palpable.   ASSESSMENT AND PLAN

## 2015-09-08 NOTE — Assessment & Plan Note (Signed)
The patient has bilateral iliac disease worse on the right side. Although ABI was normal, this can underestimate the severity of her disease in the setting of iliac stenosis and also falsely high ABI due to calcifications. Nonetheless, the patient's symptoms are not classic for claudication and she seems to have other etiologies for her overall poor functional capacity. She walks slowly with a cane and has poor balance. Thus, it is hard to predict how much improvement she would have with revascularization. She does have underlying chronic kidney disease which should be accounted for as well if we decide to proceed with angiography. I discussed the different options and given that her symptoms do not seem to be lifestyle limiting, I favor continued medical therapy for now and reevaluating her in 6 months. I advised her to continue walking as much as possible.

## 2015-09-08 NOTE — Patient Instructions (Signed)
Medication Instructions: Continue same medications.   Labwork: None.   Procedures/Testing: None.   Follow-Up: 6 months with Dr. Fletcher Anon.   Any Additional Special Instructions Will Be Listed Below (If Applicable).

## 2015-09-17 ENCOUNTER — Other Ambulatory Visit: Payer: Self-pay | Admitting: Family Medicine

## 2015-09-28 ENCOUNTER — Encounter: Payer: Self-pay | Admitting: Emergency Medicine

## 2015-09-28 ENCOUNTER — Emergency Department: Payer: Medicare Other

## 2015-09-28 ENCOUNTER — Inpatient Hospital Stay
Admission: EM | Admit: 2015-09-28 | Discharge: 2015-10-02 | DRG: 439 | Disposition: A | Discharge status: Home Health | Payer: Medicare Other | Attending: Internal Medicine | Admitting: Internal Medicine

## 2015-09-28 DIAGNOSIS — K8051 Calculus of bile duct without cholangitis or cholecystitis with obstruction: Secondary | ICD-10-CM | POA: Diagnosis present

## 2015-09-28 DIAGNOSIS — N179 Acute kidney failure, unspecified: Secondary | ICD-10-CM | POA: Diagnosis present

## 2015-09-28 DIAGNOSIS — E669 Obesity, unspecified: Secondary | ICD-10-CM | POA: Diagnosis present

## 2015-09-28 DIAGNOSIS — I130 Hypertensive heart and chronic kidney disease with heart failure and stage 1 through stage 4 chronic kidney disease, or unspecified chronic kidney disease: Secondary | ICD-10-CM | POA: Diagnosis present

## 2015-09-28 DIAGNOSIS — E785 Hyperlipidemia, unspecified: Secondary | ICD-10-CM | POA: Diagnosis present

## 2015-09-28 DIAGNOSIS — K8021 Calculus of gallbladder without cholecystitis with obstruction: Secondary | ICD-10-CM | POA: Diagnosis not present

## 2015-09-28 DIAGNOSIS — R1011 Right upper quadrant pain: Secondary | ICD-10-CM | POA: Diagnosis not present

## 2015-09-28 DIAGNOSIS — Z7982 Long term (current) use of aspirin: Secondary | ICD-10-CM | POA: Diagnosis not present

## 2015-09-28 DIAGNOSIS — K859 Acute pancreatitis without necrosis or infection, unspecified: Secondary | ICD-10-CM | POA: Diagnosis not present

## 2015-09-28 DIAGNOSIS — I509 Heart failure, unspecified: Secondary | ICD-10-CM | POA: Diagnosis present

## 2015-09-28 DIAGNOSIS — Z888 Allergy status to other drugs, medicaments and biological substances status: Secondary | ICD-10-CM

## 2015-09-28 DIAGNOSIS — K851 Biliary acute pancreatitis without necrosis or infection: Secondary | ICD-10-CM | POA: Diagnosis not present

## 2015-09-28 DIAGNOSIS — I471 Supraventricular tachycardia: Secondary | ICD-10-CM | POA: Diagnosis not present

## 2015-09-28 DIAGNOSIS — Z79899 Other long term (current) drug therapy: Secondary | ICD-10-CM | POA: Diagnosis not present

## 2015-09-28 DIAGNOSIS — R7989 Other specified abnormal findings of blood chemistry: Secondary | ICD-10-CM | POA: Diagnosis present

## 2015-09-28 DIAGNOSIS — K219 Gastro-esophageal reflux disease without esophagitis: Secondary | ICD-10-CM | POA: Diagnosis not present

## 2015-09-28 DIAGNOSIS — Z6833 Body mass index (BMI) 33.0-33.9, adult: Secondary | ICD-10-CM

## 2015-09-28 DIAGNOSIS — I10 Essential (primary) hypertension: Secondary | ICD-10-CM | POA: Diagnosis not present

## 2015-09-28 DIAGNOSIS — Z96652 Presence of left artificial knee joint: Secondary | ICD-10-CM

## 2015-09-28 DIAGNOSIS — R52 Pain, unspecified: Secondary | ICD-10-CM

## 2015-09-28 DIAGNOSIS — E860 Dehydration: Secondary | ICD-10-CM | POA: Diagnosis present

## 2015-09-28 DIAGNOSIS — Z7902 Long term (current) use of antithrombotics/antiplatelets: Secondary | ICD-10-CM | POA: Diagnosis not present

## 2015-09-28 DIAGNOSIS — Z87891 Personal history of nicotine dependence: Secondary | ICD-10-CM

## 2015-09-28 DIAGNOSIS — I739 Peripheral vascular disease, unspecified: Secondary | ICD-10-CM | POA: Diagnosis present

## 2015-09-28 DIAGNOSIS — N182 Chronic kidney disease, stage 2 (mild): Secondary | ICD-10-CM | POA: Diagnosis present

## 2015-09-28 DIAGNOSIS — K8020 Calculus of gallbladder without cholecystitis without obstruction: Secondary | ICD-10-CM | POA: Diagnosis not present

## 2015-09-28 DIAGNOSIS — I2510 Atherosclerotic heart disease of native coronary artery without angina pectoris: Secondary | ICD-10-CM | POA: Diagnosis present

## 2015-09-28 HISTORY — DX: Other ill-defined heart diseases: I51.89

## 2015-09-28 LAB — CBC WITH DIFFERENTIAL/PLATELET
Basophils Absolute: 0 10*3/uL (ref 0–0.1)
Basophils Relative: 0 %
EOS ABS: 0 10*3/uL (ref 0–0.7)
Eosinophils Relative: 1 %
HCT: 39.8 % (ref 35.0–47.0)
HEMOGLOBIN: 12.6 g/dL (ref 12.0–16.0)
LYMPHS ABS: 0.5 10*3/uL — AB (ref 1.0–3.6)
LYMPHS PCT: 7 %
MCH: 27.7 pg (ref 26.0–34.0)
MCHC: 31.8 g/dL — ABNORMAL LOW (ref 32.0–36.0)
MCV: 87.4 fL (ref 80.0–100.0)
Monocytes Absolute: 0.4 10*3/uL (ref 0.2–0.9)
Monocytes Relative: 5 %
NEUTROS PCT: 87 %
Neutro Abs: 6.5 10*3/uL (ref 1.4–6.5)
Platelets: 193 10*3/uL (ref 150–440)
RBC: 4.55 MIL/uL (ref 3.80–5.20)
RDW: 15.4 % — ABNORMAL HIGH (ref 11.5–14.5)
WBC: 7.5 10*3/uL (ref 3.6–11.0)

## 2015-09-28 LAB — COMPREHENSIVE METABOLIC PANEL
ALT: 291 U/L — AB (ref 14–54)
AST: 244 U/L — AB (ref 15–41)
Albumin: 4.3 g/dL (ref 3.5–5.0)
Alkaline Phosphatase: 196 U/L — ABNORMAL HIGH (ref 38–126)
Anion gap: 8 (ref 5–15)
BILIRUBIN TOTAL: 4.4 mg/dL — AB (ref 0.3–1.2)
BUN: 24 mg/dL — AB (ref 6–20)
CHLORIDE: 106 mmol/L (ref 101–111)
CO2: 23 mmol/L (ref 22–32)
CREATININE: 1.28 mg/dL — AB (ref 0.44–1.00)
Calcium: 9.5 mg/dL (ref 8.9–10.3)
GFR, EST AFRICAN AMERICAN: 46 mL/min — AB (ref >60)
GFR, EST NON AFRICAN AMERICAN: 39 mL/min — AB (ref >60)
Glucose, Bld: 132 mg/dL — ABNORMAL HIGH (ref 65–99)
Potassium: 4.2 mmol/L (ref 3.5–5.1)
Sodium: 137 mmol/L (ref 135–145)
TOTAL PROTEIN: 7.6 g/dL (ref 6.5–8.1)

## 2015-09-28 LAB — URINALYSIS COMPLETE WITH MICROSCOPIC (ARMC ONLY)
Bacteria, UA: NONE SEEN
Glucose, UA: NEGATIVE mg/dL
KETONES UR: NEGATIVE mg/dL
Nitrite: NEGATIVE
PH: 5 (ref 5.0–8.0)
Protein, ur: 100 mg/dL — AB
Specific Gravity, Urine: 1.028 (ref 1.005–1.030)

## 2015-09-28 LAB — MAGNESIUM: Magnesium: 1.9 mg/dL (ref 1.7–2.4)

## 2015-09-28 LAB — LIPASE, BLOOD: LIPASE: 7795 U/L — AB (ref 11–51)

## 2015-09-28 MED ORDER — SODIUM CHLORIDE 0.9 % IV SOLN
INTRAVENOUS | Status: AC
  Administered 2015-09-29 (×2): via INTRAVENOUS

## 2015-09-28 MED ORDER — ONDANSETRON HCL 4 MG/2ML IJ SOLN
4.0000 mg | Freq: Four times a day (QID) | INTRAMUSCULAR | Status: DC | PRN
  Administered 2015-10-01 (×2): 4 mg via INTRAVENOUS
  Filled 2015-09-28 (×2): qty 2

## 2015-09-28 MED ORDER — MORPHINE SULFATE (PF) 4 MG/ML IV SOLN
4.0000 mg | Freq: Once | INTRAVENOUS | Status: AC
  Administered 2015-09-28: 4 mg via INTRAVENOUS
  Filled 2015-09-28: qty 1

## 2015-09-28 MED ORDER — LEVOCETIRIZINE DIHYDROCHLORIDE 5 MG PO TABS: 5.0000 mg | ORAL_TABLET | Freq: Every evening | ORAL | Status: DC

## 2015-09-28 MED ORDER — OXYCODONE HCL 5 MG PO TABS
5.0000 mg | ORAL_TABLET | ORAL | Status: DC | PRN
  Administered 2015-09-29 – 2015-10-02 (×5): 5 mg via ORAL
  Filled 2015-09-28 (×5): qty 1

## 2015-09-28 MED ORDER — ACETAMINOPHEN 325 MG PO TABS: 650.0000 mg | ORAL_TABLET | Freq: Four times a day (QID) | ORAL | Status: DC | PRN

## 2015-09-28 MED ORDER — VALSARTAN-HYDROCHLOROTHIAZIDE 320-12.5 MG PO TABS: 1.0000 | ORAL_TABLET | Freq: Every day | ORAL | Status: DC

## 2015-09-28 MED ORDER — ALBUTEROL SULFATE (2.5 MG/3ML) 0.083% IN NEBU: 2.5000 mg | INHALATION_SOLUTION | RESPIRATORY_TRACT | Status: DC | PRN

## 2015-09-28 MED ORDER — MORPHINE SULFATE (PF) 4 MG/ML IV SOLN
4.0000 mg | INTRAVENOUS | Status: DC | PRN
  Administered 2015-09-28 – 2015-10-01 (×8): 4 mg via INTRAVENOUS
  Filled 2015-09-28 (×8): qty 1

## 2015-09-28 MED ORDER — CLOPIDOGREL BISULFATE 75 MG PO TABS
75.0000 mg | ORAL_TABLET | Freq: Every day | ORAL | Status: DC
  Administered 2015-09-28 – 2015-09-30 (×3): 75 mg via ORAL
  Filled 2015-09-28 (×3): qty 1

## 2015-09-28 MED ORDER — ASPIRIN EC 81 MG PO TBEC
81.0000 mg | DELAYED_RELEASE_TABLET | Freq: Every day | ORAL | Status: DC
  Administered 2015-09-29 – 2015-09-30 (×2): 81 mg via ORAL
  Filled 2015-09-28 (×3): qty 1

## 2015-09-28 MED ORDER — IRBESARTAN 75 MG PO TABS
300.0000 mg | ORAL_TABLET | Freq: Every day | ORAL | Status: DC
  Administered 2015-09-30: 300 mg via ORAL
  Filled 2015-09-28 (×2): qty 4

## 2015-09-28 MED ORDER — ONDANSETRON HCL 4 MG/2ML IJ SOLN
4.0000 mg | Freq: Once | INTRAMUSCULAR | Status: AC
  Administered 2015-09-28: 4 mg via INTRAVENOUS
  Filled 2015-09-28: qty 2

## 2015-09-28 MED ORDER — LEVOTHYROXINE SODIUM 75 MCG PO TABS
75.0000 ug | ORAL_TABLET | Freq: Every day | ORAL | Status: DC
  Administered 2015-09-29 – 2015-10-02 (×4): 75 ug via ORAL
  Filled 2015-09-28 (×4): qty 1

## 2015-09-28 MED ORDER — HEPARIN SODIUM (PORCINE) 5000 UNIT/ML IJ SOLN
5000.0000 [IU] | Freq: Three times a day (TID) | INTRAMUSCULAR | Status: DC
  Administered 2015-09-29 – 2015-10-02 (×10): 5000 [IU] via SUBCUTANEOUS
  Filled 2015-09-28 (×10): qty 1

## 2015-09-28 MED ORDER — METOPROLOL TARTRATE 50 MG PO TABS
50.0000 mg | ORAL_TABLET | Freq: Two times a day (BID) | ORAL | Status: DC
  Administered 2015-09-28 – 2015-10-02 (×7): 50 mg via ORAL
  Filled 2015-09-28 (×8): qty 1

## 2015-09-28 MED ORDER — ONDANSETRON HCL 4 MG PO TABS: 4.0000 mg | ORAL_TABLET | Freq: Four times a day (QID) | ORAL | Status: DC | PRN

## 2015-09-28 MED ORDER — ACETAMINOPHEN 650 MG RE SUPP: 650.0000 mg | Freq: Four times a day (QID) | RECTAL | Status: DC | PRN

## 2015-09-28 MED ORDER — HYDROCHLOROTHIAZIDE 12.5 MG PO CAPS
12.5000 mg | ORAL_CAPSULE | Freq: Every day | ORAL | Status: DC
  Administered 2015-09-30: 12.5 mg via ORAL
  Filled 2015-09-28 (×2): qty 1

## 2015-09-28 MED ORDER — SODIUM CHLORIDE 0.9 % IV BOLUS (SEPSIS)
1000.0000 mL | Freq: Once | INTRAVENOUS | Status: AC
  Administered 2015-09-28: 1000 mL via INTRAVENOUS

## 2015-09-28 MED ORDER — LORAZEPAM 0.5 MG PO TABS
0.5000 mg | ORAL_TABLET | Freq: Three times a day (TID) | ORAL | Status: DC
  Administered 2015-09-28 – 2015-10-02 (×11): 0.5 mg via ORAL
  Filled 2015-09-28 (×11): qty 1

## 2015-09-28 MED ORDER — FLUTICASONE PROPIONATE 50 MCG/ACT NA SUSP
1.0000 | Freq: Every day | NASAL | Status: DC
  Administered 2015-09-29 – 2015-10-02 (×3): 1 via NASAL
  Filled 2015-09-28: qty 16

## 2015-09-28 MED ORDER — LORATADINE 10 MG PO TABS: 10.0000 mg | ORAL_TABLET | Freq: Every day | ORAL | Status: DC

## 2015-09-28 MED ORDER — ISOSORBIDE MONONITRATE ER 30 MG PO TB24
30.0000 mg | ORAL_TABLET | Freq: Every day | ORAL | Status: DC
  Administered 2015-09-28 – 2015-09-30 (×2): 30 mg via ORAL
  Filled 2015-09-28 (×3): qty 1

## 2015-09-28 MED ORDER — FUROSEMIDE 20 MG PO TABS
20.0000 mg | ORAL_TABLET | Freq: Every day | ORAL | Status: DC
Start: 2015-09-28 — End: 2015-09-30
  Administered 2015-09-28 – 2015-09-30 (×2): 20 mg via ORAL
  Filled 2015-09-28 (×3): qty 1

## 2015-09-28 MED ORDER — NITROGLYCERIN 0.4 MG SL SUBL: 0.4000 mg | SUBLINGUAL_TABLET | SUBLINGUAL | Status: DC | PRN

## 2015-09-28 MED ORDER — LORATADINE 10 MG PO TABS
10.0000 mg | ORAL_TABLET | Freq: Every day | ORAL | Status: DC
  Administered 2015-09-29 – 2015-10-02 (×4): 10 mg via ORAL
  Filled 2015-09-28 (×5): qty 1

## 2015-09-28 NOTE — ED Notes (Signed)
Pt transported to [place] via stretcher.

## 2015-09-28 NOTE — H&P (Signed)
[place] [facility] [place] NAME: Carolyn Hendricks    MR#:  [id number]  DATE OF BIRTH:  1936/06/29  DATE OF ADMISSION:  09/28/2015  PRIMARY CARE PHYSICIAN: [name], MD   REQUESTING/REFERRING PHYSICIAN: Joanne Gavel, MD  CHIEF COMPLAINT:   Chief Complaint  Patient presents with  . Abdominal Pain    HISTORY OF PRESENT ILLNESS:  Carolyn Hendricks  is a 79 y.o. female with a known history of CHF, CAD and hypertension. The patient to present to the ED with the abdominal pain with nausea and vomiting for 3 days. Abdominal pain is diffuse, constant, 10 out of 10 with radiation to back. Patient also has associated nausea and vomiting. She also complains of dark urine. She denies any fever or chills, no dysuria or hematuria, no melena or bloody stool. His lipase is more than 7795. Abdominal ultrasound showed Abundant cholelithiasis and common bile duct that dictation.  PAST MEDICAL HISTORY:   Past Medical History  Diagnosis Date  . Heart murmur   . Head ache   . Chest pain   . Shortness of breath   . Dizziness   . High blood pressure   . Arthritis   . History of MRSA infection   . Chronic kidney disease   . CHF (congestive heart failure) ([place])     PAST SURGICAL HISTORY:   Past Surgical History  Procedure Laterality Date  . Carotid endarterectomy Left Dr. Lucky Cowboy  . Knee replacement (other) Left 2002  . Foot and ankle surgery    . Appendectomy  1950  . Cardiac catheterization N/A 06/12/2015    Procedure: Left Heart Cath and Coronary Angiography;  Surgeon: Minna Merritts, MD;  Location: [facility];  Service: Cardiovascular;  Laterality: N/A;    SOCIAL HISTORY:   Social History  Substance Use Topics  . Smoking status: Former Smoker -- 1.00 packs/day for 26 years    Types: Cigarettes    Quit date: 10/11/2011  . Smokeless tobacco: Not on file     Comment: Patient has smoked for 26 years; has quit but now started back 0.25  ppw.  . Alcohol Use: No    FAMILY HISTORY:   Family History  Problem Relation Age of Onset  . Congestive Heart Failure Mother 52  . Cancer Brother   . Kidney failure Father     DRUG ALLERGIES:   Allergies  Allergen Reactions  . Atorvastatin     Other reaction(s): Muscle Pain  . Crestor  [Rosuvastatin Calcium]     Other reaction(s): Muscle Pain  . Pravastatin Sodium     Muscle pain    REVIEW OF SYSTEMS:  CONSTITUTIONAL: No fever, has generalized weakness.  EYES: No blurred or double vision.  EARS, NOSE, AND THROAT: No tinnitus or ear pain.  RESPIRATORY: No cough, shortness of breath, wheezing or hemoptysis.  CARDIOVASCULAR: No chest pain, orthopnea, edema.  GASTROINTESTINAL: Has abdominal pain, nausea, vomiting, no diarrhea, melena or bloody stool.  GENITOURINARY: No dysuria, hematuria.  ENDOCRINE: No polyuria, nocturia,  HEMATOLOGY: No anemia, easy bruising or bleeding SKIN: No rash or lesion. MUSCULOSKELETAL: No joint pain or arthritis.   NEUROLOGIC: No tingling, numbness, weakness.  PSYCHIATRY: No anxiety or depression.   MEDICATIONS AT HOME:   Prior to Admission medications   Medication Sig Start Date End Date Taking? Authorizing Provider  aspirin 81 MG tablet Take 81 mg by mouth daily.   Yes Historical Provider, MD  cetirizine (ZYRTEC) 10  MG tablet Take 10 mg by mouth daily.   Yes Historical Provider, MD  clopidogrel (PLAVIX) 75 MG tablet Take 75 mg by mouth daily.   Yes Historical Provider, MD  Cyanocobalamin (VITAMIN B-12 PO) Take by mouth daily.   Yes Historical Provider, MD  fluticasone (FLONASE) 50 MCG/ACT nasal spray INHALE ONE TO TWO SPRAY IN EACH NOSTRIL DAILY AS NEEDED 09/17/15  Yes Birdie Sons, MD  Fluticasone-Salmeterol (ADVAIR DISKUS) 250-50 MCG/DOSE AEPB Inhale 1 puff into the lungs every 12 (twelve) hours. 06/29/15  Yes Birdie Sons, MD  furosemide (LASIX) 20 MG tablet Take 1 tablet (20 mg total) by mouth daily. 04/29/15  Yes Birdie Sons, MD   HYDROcodone-acetaminophen (NORCO) 10-325 MG tablet Take 1 tablet by mouth every 4 (four) hours as needed. 09/07/15  Yes Birdie Sons, MD  isosorbide mononitrate (IMDUR) 30 MG 24 hr tablet Take 1 tablet (30 mg total) by mouth daily. 05/14/15  Yes Minna Merritts, MD  levocetirizine (XYZAL) 5 MG tablet Take 5 mg by mouth every evening.   Yes Historical Provider, MD  levothyroxine (SYNTHROID, LEVOTHROID) 75 MCG tablet Take 1 tablet (75 mcg total) by mouth daily. 04/17/15  Yes Birdie Sons, MD  LORazepam (ATIVAN) 0.5 MG tablet Take 0.5 mg by mouth every 8 (eight) hours.   Yes Historical Provider, MD  meloxicam (MOBIC) 15 MG tablet Take 1 tablet (15 mg total) by mouth daily. As needed for arthritis pain 05/25/15  Yes Birdie Sons, MD  metoprolol tartrate (LOPRESSOR) 50 MG tablet Take 1 tablet (50 mg total) by mouth 2 (two) times daily. 06/12/15  Yes Minna Merritts, MD  Multiple Vitamin (MULTI-VITAMIN DAILY PO) Take by mouth.   Yes Historical Provider, MD  mupirocin ointment (BACTROBAN) 2 % Apply 1 application topically 3 (three) times daily.  12/10/13  Yes Historical Provider, MD  nitroGLYCERIN (NITROSTAT) 0.4 MG SL tablet Place 1 tablet (0.4 mg total) under the tongue every 5 (five) minutes as needed for chest pain. 05/14/15  Yes Minna Merritts, MD  sertraline (ZOLOFT) 50 MG tablet 0.5 to 1 tablet daily 06/18/15  Yes Birdie Sons, MD  simvastatin (ZOCOR) 20 MG tablet Take 1 tablet (20 mg total) by mouth daily at 6 PM. 07/06/15  Yes Minna Merritts, MD  traZODone (DESYREL) 100 MG tablet 1/2-1 tablet at bedtime as needed 06/24/15  Yes Birdie Sons, MD  valsartan-hydrochlorothiazide (DIOVAN-HCT) 320-12.5 MG per tablet Take 1 tablet by mouth daily.   Yes Historical Provider, MD      VITAL SIGNS:  Blood pressure 171/85, pulse 70, temperature 97.5 F (36.4 C), temperature source Oral, resp. rate 18, height _0  (1.575 m), weight 83.915 kg (185 lb), SpO2 94 %.  PHYSICAL EXAMINATION:  GENERAL:  79  y.o.-year-old patient lying in the bed with no acute distress. Obese. EYES: Pupils equal, round, reactive to light and accommodation. No scleral icterus. Extraocular muscles intact.  HEENT: Head atraumatic, normocephalic. Oropharynx and nasopharynx clear.  NECK:  Supple, no jugular venous distention. No thyroid enlargement, no tenderness.  LUNGS: Normal breath sounds bilaterally, no wheezing, rales,rhonchi or crepitation. No use of accessory muscles of respiration.  CARDIOVASCULAR: S1, S2 normal. No murmurs, rubs, or gallops.  ABDOMEN: Soft, diffuse tenderness in upper abdomen, no rigidity, or rebound, nondistended. Bowel sounds present. No organomegaly or mass.  EXTREMITIES: No pedal edema, cyanosis, or clubbing.  NEUROLOGIC: Cranial nerves II through XII are intact. Muscle strength 5/5 in all extremities. Sensation intact. Gait  not checked.  PSYCHIATRIC: The patient is alert and oriented x 3.  SKIN: No obvious rash, lesion, or ulcer.   LABORATORY PANEL:   CBC  Recent Labs Lab 09/28/15 1544  WBC 7.5  HGB 12.6  HCT 39.8  PLT 193   ------------------------------------------------------------------------------------------------------------------  Chemistries   Recent Labs Lab 09/28/15 1544  NA 137  K 4.2  CL 106  CO2 23  GLUCOSE 132*  BUN 24*  CREATININE 1.28*  CALCIUM 9.5  AST 244*  ALT 291*  ALKPHOS 196*  BILITOT 4.4*   ------------------------------------------------------------------------------------------------------------------  Cardiac Enzymes No results for input(s): TROPONINI in the last 168 hours. ------------------------------------------------------------------------------------------------------------------  RADIOLOGY:  US Abdomen Limited Ruq  09/28/2015  CLINICAL DATA:  Right upper quadrant pain for 5 days EXAM: US ABDOMEN LIMITED - RIGHT UPPER QUADRANT COMPARISON:  None. FINDINGS: Gallbladder: There appears to be a wall echo shadow complex consistent  with numerous gallstones. There is no Murphy's sign. The gallbladder wall does not appear to be thickened. Common bile duct: Diameter: 9 mm Liver: No focal lesion identified. Within normal limits in parenchymal echogenicity. IMPRESSION: Abundant cholelithiasis.  No Murphy sign. Common bile duct dilatation. Distal obstructing process including possible distal stone not excluded. Electronically Signed   By: Skipper Cliche M.D.   On: 09/28/2015 19:37    EKG:   Orders placed or performed during the hospital encounter of 09/28/15  . ED EKG  . ED EKG  . EKG 12-Lead  . EKG 12-Lead    IMPRESSION AND PLAN:   Acute pancreatitis Keep nothing by mouth, IV fluid support, pain control, Zofran when necessary. Follow-up lipase level.  Cholelithiasis with possible common bile duct obstruction Follow-up CMP and GI consult, Dr. Gustavo Lah for possible ERCP tomorrow.  Elevated bilirubin and Abnormal liver function test. As mentioned above.  Dehydration. IV fluid support and follow-up BMP.  Hypertension. Continue hypertension medication. History of chronic CHF, unknown type, stable.   All the records are reviewed and case discussed with ED provider. Management plans discussed with the patient, her daughter and they are in agreement.  CODE STATUS: Full code  TOTAL TIME TAKING CARE OF THIS PATIENT: 58 minutes.    Demetrios Loll M.D on 09/28/2015 at 9:41 PM  Between 7am to 6pm - Pager - [phone]  After 6pm go to [email or web address] - password [email or web address] [facility]  Office  [phone]  CC: Primary care physician; [name], MD

## 2015-09-28 NOTE — ED Notes (Signed)
Pt with 7/10 abd pain. Pain upper abd left and right. + bowel sounds. A/o

## 2015-09-28 NOTE — ED Notes (Signed)
Pt reports abd pain since yesterday, today n/v/d.

## 2015-09-28 NOTE — ED Provider Notes (Signed)
[facility] Emergency Department Provider Note  ____________________________________________  Time seen: Approximately 6:47 PM  I have reviewed the triage vital signs and the nursing notes.   HISTORY  Chief Complaint Abdominal Pain    HPI Carolyn Hendricks is a 79 y.o. female with hypertension, chronic kidney disease, peripheral arterial disease, hypertension and hyperlipidemia presents for evaluation of 3 days diffuse abdominal pain, worse in the right upper quadrant, constant since onset, severe, no modifying factors, associated with nonbloody nonbilious emesis as well as nonbloody diarrhea. No fevers or chills or no chest pain or difficulty breathing. No dysuria. She has never had any pain like this before. Abdominal surgical history is positive for appendectomy.   Past Medical History  Diagnosis Date  . Heart murmur   . Head ache   . Chest pain   . Shortness of breath   . Dizziness   . High blood pressure   . Arthritis   . History of MRSA infection   . Chronic kidney disease   . CHF (congestive heart failure) [facility])     Patient Active Problem List   Diagnosis Date Noted  . Acute pancreatitis 09/28/2015  . MRSA cellulitis 06/29/2015  . PAD (peripheral artery disease) ([place])   . SOB (shortness of breath)   . Unable to ambulate   . Abnormal finding on EKG   . LBBB (left bundle branch block)   . Angina pectoris ([place]) 05/14/2015  . Leg swelling 05/14/2015  . Allergic rhinitis 04/17/2015  . Anxiety 04/17/2015  . Arthralgia of hand 04/17/2015  . Ache in joint 04/17/2015  . Chronic kidney disease (CKD), stage III (moderate) 04/17/2015  . Fatigue 04/17/2015  . Numbness and tingling 04/17/2015  . Nonspecific abnormal finding in stool contents 04/17/2015  . RAD (reactive airway disease) with wheezing 04/17/2015  . Restless leg 04/17/2015  . Foot pain 04/17/2015  . Hypothyroidism 07/09/2014  . Abnormal mammogram 11/01/2012  . Carotid stenosis  03/28/2012  . Atrial tachycardia ([place]) 03/28/2012  . Hyperlipidemia 03/28/2012  . Hyperpotassemia 02/27/2010  . Heart murmur 03/10/2009  . Insomnia 03/05/2009  . Essential (primary) hypertension 01/29/2009  . Goiter 01/29/2009  . History of tobacco use 01/29/2009  . Spinal stenosis of lumbar region without neurogenic claudication 07/14/2003    Past Surgical History  Procedure Laterality Date  . Carotid endarterectomy Left Dr. Lucky Cowboy  . Knee replacement (other) Left 2002  . Foot and ankle surgery    . Appendectomy  1950  . Cardiac catheterization N/A 06/12/2015    Procedure: Left Heart Cath and Coronary Angiography;  Surgeon: Minna Merritts, MD;  Location: [facility];  Service: Cardiovascular;  Laterality: N/A;    Current Outpatient Rx  Name  Route  Sig  Dispense  Refill  . aspirin 81 MG tablet   Oral   Take 81 mg by mouth daily.         . cetirizine (ZYRTEC) 10 MG tablet   Oral   Take 10 mg by mouth daily.         . clopidogrel (PLAVIX) 75 MG tablet   Oral   Take 75 mg by mouth daily.         . Cyanocobalamin (VITAMIN B-12 PO)   Oral   Take by mouth daily.         . fluticasone (FLONASE) 50 MCG/ACT nasal spray      INHALE ONE TO TWO SPRAY IN EACH NOSTRIL DAILY AS NEEDED   16 g  6   . Fluticasone-Salmeterol (ADVAIR DISKUS) 250-50 MCG/DOSE AEPB   Inhalation   Inhale 1 puff into the lungs every 12 (twelve) hours.   60 each   12   . furosemide (LASIX) 20 MG tablet   Oral   Take 1 tablet (20 mg total) by mouth daily.   30 tablet   0   . HYDROcodone-acetaminophen (NORCO) 10-325 MG tablet   Oral   Take 1 tablet by mouth every 4 (four) hours as needed.   180 tablet   0   . isosorbide mononitrate (IMDUR) 30 MG 24 hr tablet   Oral   Take 1 tablet (30 mg total) by mouth daily.   30 tablet   11   . levocetirizine (XYZAL) 5 MG tablet   Oral   Take 5 mg by mouth every evening.         Marland Kitchen levothyroxine (SYNTHROID, LEVOTHROID) 75 MCG tablet    Oral   Take 1 tablet (75 mcg total) by mouth daily.   30 tablet   6   . LORazepam (ATIVAN) 0.5 MG tablet   Oral   Take 0.5 mg by mouth every 8 (eight) hours.         . meloxicam (MOBIC) 15 MG tablet   Oral   Take 1 tablet (15 mg total) by mouth daily. As needed for arthritis pain   30 tablet   12   . metoprolol tartrate (LOPRESSOR) 50 MG tablet   Oral   Take 1 tablet (50 mg total) by mouth 2 (two) times daily.   180 tablet   3   . Multiple Vitamin (MULTI-VITAMIN DAILY PO)   Oral   Take by mouth.         . mupirocin ointment (BACTROBAN) 2 %   Topical   Apply 1 application topically 3 (three) times daily.          . nitroGLYCERIN (NITROSTAT) 0.4 MG SL tablet   Sublingual   Place 1 tablet (0.4 mg total) under the tongue every 5 (five) minutes as needed for chest pain.   25 tablet   3   . sertraline (ZOLOFT) 50 MG tablet      0.5 to 1 tablet daily   30 tablet   6   . simvastatin (ZOCOR) 20 MG tablet   Oral   Take 1 tablet (20 mg total) by mouth daily at 6 PM.   90 tablet   3   . traZODone (DESYREL) 100 MG tablet      1/2-1 tablet at bedtime as needed   30 tablet   5   . valsartan-hydrochlorothiazide (DIOVAN-HCT) 320-12.5 MG per tablet   Oral   Take 1 tablet by mouth daily.           Allergies Atorvastatin; Crestor ; and Pravastatin sodium  Family History  Problem Relation Age of Onset  . Congestive Heart Failure Mother 73  . Cancer Brother   . Kidney failure Father     Social History Social History  Substance Use Topics  . Smoking status: Former Smoker -- 1.00 packs/day for 26 years    Types: Cigarettes    Quit date: 10/11/2011  . Smokeless tobacco: None     Comment: Patient has smoked for 26 years; has quit but now started back 0.25 ppw.  . Alcohol Use: No    Review of Systems Constitutional: No fever/chills Eyes: No visual changes. ENT: No sore throat. Cardiovascular: Denies chest pain. Respiratory: Denies shortness of  breath. Gastrointestinal: + abdominal pain.  + nausea, + vomiting.  + diarrhea.  No constipation. Genitourinary: Negative for dysuria. Musculoskeletal: Negative for back pain. Skin: Negative for rash. Neurological: Negative for headaches, focal weakness or numbness.  10-point ROS otherwise negative.  ____________________________________________   PHYSICAL EXAM:  VITAL SIGNS: ED Triage Vitals  Enc Vitals Group     BP 09/28/15 1541 162/64 mmHg     Pulse Rate 09/28/15 1541 56     Resp 09/28/15 1541 18     Temp 09/28/15 1541 98.7 F (37.1 C)     Temp Source 09/28/15 1541 Oral     SpO2 09/28/15 1541 96 %     Weight 09/28/15 1541 185 lb (83.915 kg)     Height 09/28/15 1541 _0  (1.575 m)     Head Cir --      Peak Flow --      Pain Score 09/28/15 1542 10     Pain Loc --      Pain Edu? --      Excl. in [place]? --     Constitutional: Alert and oriented. Well appearing and in no acute distress.  Eyes: Conjunctivae are normal. PERRL. EOMI. Head: Atraumatic. Nose: No congestion/rhinnorhea. Mouth/Throat: Mucous membranes are moist.  Oropharynx non-erythematous. Neck: No stridor.   Cardiovascular: Normal rate, regular rhythm. Grossly normal heart sounds.  Good peripheral circulation. Respiratory: Normal respiratory effort.  No retractions. Lungs CTAB. Gastrointestinal: Soft with severe tenderness in the right upper quadrant. No CVA tenderness. Genitourinary: deferred Musculoskeletal: No lower extremity tenderness nor edema.  No joint effusions. Neurologic:  Normal speech and language. No gross focal neurologic deficits are appreciated. No gait instability. Skin:  Skin is warm, dry and intact. No rash noted. Psychiatric: Mood and affect are normal. Speech and behavior are normal.  ____________________________________________   LABS (all labs ordered are listed, but only abnormal results are displayed)  Labs Reviewed  COMPREHENSIVE METABOLIC PANEL - Abnormal; Notable for the  following:    Glucose, Bld 132 (*)    BUN 24 (*)    Creatinine, Ser 1.28 (*)    AST 244 (*)    ALT 291 (*)    Alkaline Phosphatase 196 (*)    Total Bilirubin 4.4 (*)    GFR calc non Af Amer 39 (*)    GFR calc Af Amer 46 (*)    All other components within normal limits  CBC WITH DIFFERENTIAL/PLATELET - Abnormal; Notable for the following:    MCHC 31.8 (*)    RDW 15.4 (*)    Lymphs Abs 0.5 (*)    All other components within normal limits  LIPASE, BLOOD - Abnormal; Notable for the following:    Lipase 7795 (*)    All other components within normal limits  URINALYSIS COMPLETEWITH MICROSCOPIC ([facility] ONLY) - Abnormal; Notable for the following:    Color, Urine AMBER (*)    APPearance CLEAR (*)    Bilirubin Urine 2+ (*)    Hgb urine dipstick 1+ (*)    Protein, ur 100 (*)    Leukocytes, UA 1+ (*)    Squamous Epithelial / LPF 0-5 (*)    All other components within normal limits   ____________________________________________  EKG  ED ECG REPORT I, Joanne Gavel, the attending physician, personally viewed and interpreted this ECG.   Date: 09/28/2015  EKG Time: 16:01  Rate: 62  Rhythm: normal sinus rhythm with sinus arrhythmia  Axis: left  Intervals:left bundle branch block  ST&T Change:  No acute ST elevation. Left bundle-branch block is chronic and seen on EKG in 1997.  ____________________________________________  RADIOLOGY  RUQ ultrasound FINDINGS: Gallbladder:  There appears to be a wall echo shadow complex consistent with numerous gallstones. There is no Murphy's sign. The gallbladder wall does not appear to be thickened.  Common bile duct:  Diameter: 9 mm  Liver:  No focal lesion identified. Within normal limits in parenchymal echogenicity.  IMPRESSION: Abundant cholelithiasis. No Murphy sign.  Common bile duct dilatation. Distal obstructing process including possible distal stone not  excluded. ____________________________________________   PROCEDURES  Procedure(s) performed: None  Critical Care performed: No  ____________________________________________   INITIAL IMPRESSION / ASSESSMENT AND PLAN / ED COURSE  Pertinent labs & imaging results that were available during my care of the patient were reviewed by me and considered in my medical decision making (see chart for details).  DEJANE SCHEIBE is a 79 y.o. female with hypertension, chronic kidney disease, peripheral arterial disease, hypertension and hyperlipidemia presents for evaluation of 3 days diffuse abdominal pain, worse in the right upper quadrant. On exam, she is nontoxic appearing but she does have severe tenderness to palpation in the right upper quadrant. Labs reviewed. CBC is unremarkable however CMP shows chronic elevation of creatinine and significant elevation of her liver function tests as well as T bili. Lipase is elevated at greater than 7000. Will treat her pain. Right upper quadrant ultrasound pending due to concern for cholecystitis, possible gallstone pancreatitis.  ----------------------------------------- 8:05 PM on 09/28/2015 -----------------------------------------  Her quadrant ultrasound shows abundant gallstones with common bile duct dilatation concerning for possible distal stone which is causing obstruction. I discussed the case with Dr. Gustavo Lah of GI who will likely perform ERCP tomorrow. I discussed the case with Dr. Geryl Councilman, hospitalist, for admission.  ____________________________________________   FINAL CLINICAL IMPRESSION(S) / ED DIAGNOSES  Final diagnoses:  Pain  RUQ pain  Acute gallstone pancreatitis      Joanne Gavel, MD 09/28/15 2006

## 2015-09-28 NOTE — ED Notes (Signed)
MD Chen at bedside.

## 2015-09-29 DIAGNOSIS — K851 Biliary acute pancreatitis without necrosis or infection: Secondary | ICD-10-CM | POA: Insufficient documentation

## 2015-09-29 LAB — COMPREHENSIVE METABOLIC PANEL
ALT: 198 U/L — ABNORMAL HIGH (ref 14–54)
AST: 132 U/L — ABNORMAL HIGH (ref 15–41)
Albumin: 3.6 g/dL (ref 3.5–5.0)
Alkaline Phosphatase: 153 U/L — ABNORMAL HIGH (ref 38–126)
Anion gap: 5 (ref 5–15)
BUN: 24 mg/dL — ABNORMAL HIGH (ref 6–20)
CHLORIDE: 112 mmol/L — AB (ref 101–111)
CO2: 24 mmol/L (ref 22–32)
CREATININE: 1.24 mg/dL — AB (ref 0.44–1.00)
Calcium: 9 mg/dL (ref 8.9–10.3)
GFR, EST AFRICAN AMERICAN: 47 mL/min — AB (ref >60)
GFR, EST NON AFRICAN AMERICAN: 41 mL/min — AB (ref >60)
Glucose, Bld: 80 mg/dL (ref 65–99)
POTASSIUM: 3.9 mmol/L (ref 3.5–5.1)
Sodium: 141 mmol/L (ref 135–145)
TOTAL PROTEIN: 6.5 g/dL (ref 6.5–8.1)
Total Bilirubin: 1.3 mg/dL — ABNORMAL HIGH (ref 0.3–1.2)

## 2015-09-29 LAB — CBC
HCT: 36.1 % (ref 35.0–47.0)
Hemoglobin: 11.3 g/dL — ABNORMAL LOW (ref 12.0–16.0)
MCH: 27.8 pg (ref 26.0–34.0)
MCHC: 31.4 g/dL — AB (ref 32.0–36.0)
MCV: 88.7 fL (ref 80.0–100.0)
PLATELETS: 167 10*3/uL (ref 150–440)
RBC: 4.07 MIL/uL (ref 3.80–5.20)
RDW: 15.6 % — AB (ref 11.5–14.5)
WBC: 8.1 10*3/uL (ref 3.6–11.0)

## 2015-09-29 LAB — LIPASE, BLOOD: LIPASE: 1290 U/L — AB (ref 11–51)

## 2015-09-29 MED ORDER — SERTRALINE HCL 25 MG PO TABS
50.0000 mg | ORAL_TABLET | Freq: Every day | ORAL | Status: DC
  Administered 2015-09-29 – 2015-10-02 (×4): 50 mg via ORAL
  Filled 2015-09-29: qty 1
  Filled 2015-09-29: qty 2
  Filled 2015-09-29 (×2): qty 1

## 2015-09-29 MED ORDER — SODIUM CHLORIDE 0.9 % IV SOLN: INTRAVENOUS | Status: AC

## 2015-09-29 NOTE — Progress Notes (Signed)
Initial Nutrition Assessment    INTERVENTION:  Meals and snacks: Await diet progression    NUTRITION DIAGNOSIS:   Inadequate oral intake related to altered GI function as evidenced by NPO status.    GOAL:   Patient will meet greater than or equal to 90% of their needs    MONITOR:    (Energy intake, Digestive system)  REASON FOR ASSESSMENT:   Diagnosis    ASSESSMENT:      Pt admitted with abdominal pain, acute pancreatitis, cholelithasis  Past Medical History  Diagnosis Date  . Heart murmur   . Head ache   . Chest pain   . Shortness of breath   . Dizziness   . High blood pressure   . Arthritis   . History of MRSA infection   . Chronic kidney disease   . CHF (congestive heart failure) ([facility])     Current Nutrition: NPO  Food/Nutrition-Related History: Pt reports normal appetite up until 1 day prior to admission   Scheduled Medications:  . aspirin EC  81 mg Oral Daily  . clopidogrel  75 mg Oral Daily  . fluticasone  1 spray Each Nare Daily  . furosemide  20 mg Oral Daily  . heparin  5,000 Units Subcutaneous 3 times per day  . irbesartan  300 mg Oral Daily   And  . hydrochlorothiazide  12.5 mg Oral Daily  . isosorbide mononitrate  30 mg Oral Daily  . levothyroxine  75 mcg Oral QAC breakfast  . loratadine  10 mg Oral Daily  . LORazepam  0.5 mg Oral 3 times per day  . metoprolol tartrate  50 mg Oral BID    Continuous Medications:  . sodium chloride 75 mL/hr at 09/29/15 0407     Electrolyte/Renal Profile and Glucose Profile:   Recent Labs Lab 09/28/15 1544 09/28/15 2200 09/29/15 0430  NA 137  --  141  K 4.2  --  3.9  CL 106  --  112*  CO2 23  --  24  BUN 24*  --  24*  CREATININE 1.28*  --  1.24*  CALCIUM 9.5  --  9.0  MG  --  1.9  --   GLUCOSE 132*  --  80   Protein Profile:  Recent Labs Lab 09/28/15 1544 09/29/15 0430  ALBUMIN 4.3 3.6    Gastrointestinal Profile: Last BM:12/19   Nutrition-Focused Physical Exam Findings:  Nutrition-Focused physical exam completed. Findings are WDL for fat depletion, muscle depletion, and edema.     Weight Change: noted 4% wt loss in the last 3 months per wt encounters   Wt Readings from Last 10 Encounters:  09/28/15 185 lb (83.915 kg)  09/08/15 187 lb 4 oz (84.936 kg)  07/06/15 192 lb 8 oz (87.317 kg)  06/29/15 193 lb (87.544 kg)  06/12/15 186 lb (84.369 kg)  05/15/15 188 lb (85.276 kg)  05/14/15 186 lb 12 oz (84.709 kg)  05/13/15 188 lb (85.276 kg)  04/17/15 189 lb (85.73 kg)  02/26/14 188 lb 8 oz (85.503 kg)     Diet Order:  Diet NPO time specified Except for: Sips with Meds  Skin:   reviewed   Height:   Ht Readings from Last 1 Encounters:  09/28/15 5' 2" (1.575 m)    Weight:   Wt Readings from Last 1 Encounters:  09/28/15 185 lb (83.915 kg)    Ideal Body Weight:     BMI:  Body mass index is 33.83 kg/(m^2).  EDUCATION NEEDS:  No education needs identified at this time  LOW Care Level  Manly Nestle B. Zenia Resides, [place], [place] (pager) Weekend/On-Call pager [phone])

## 2015-09-29 NOTE — Consult Note (Signed)
GI Inpatient Consult Note  Reason for Consult: acute pancreatitis/gallstones    Attending Requesting Consult: Dr. Bridgett Larsson   History of Present Illness: Carolyn Hendricks is a 79 y.o. female seen for evaluation of acute pancreatitis at the request of Dr. Bridgett Larsson.    Pt is fair historian. No family at bedside. Admitted 12/19 for abdominal pain worse in RUQ. She reports developing upper abdominal pain about 6 days ago. Pain was constant, unrelieved with Pepto Bismal. Associated w/ nausea, vomiting, and loose stools. Limited PO intake at home. She had some mild intermittent epigastric pain at home prior to moderate-severe pain 6 days ago. She repots being on a PPI but none on med list and she is unsure of the name. Feels GERD is fairly well controlled w/ her unknown GERD medication. She denies dysphagia. Weight up recently. Using [facility] powders for headaches 3x/week on average.   She reports chronic constipation, having a BM about QOD. Very rare laxative use. Denies chronic lower abdominal pain, diarrhea, rectal bleeding.    Last Colonoscopy: 11/2013 - Dr. Rayann Heman - small and large mouth diverticula in sigmoid, internal hemorrhoids, 4 mm polyp path-TA negative HGD  Last Endoscopy: Denies    Past Medical History:  Past Medical History  Diagnosis Date  . Heart murmur   . Head ache   . Chest pain   . Shortness of breath   . Dizziness   . High blood pressure   . Arthritis   . History of MRSA infection   . Chronic kidney disease   . CHF (congestive heart failure) ([place])     Problem List: Patient Active Problem List   Diagnosis Date Noted  . Acute pancreatitis 09/28/2015  . MRSA cellulitis 06/29/2015  . PAD (peripheral artery disease) ([place])   . SOB (shortness of breath)   . Unable to ambulate   . Abnormal finding on EKG   . LBBB (left bundle branch block)   . Angina pectoris ([place]) 05/14/2015  . Leg swelling 05/14/2015  . Allergic rhinitis 04/17/2015  . Anxiety 04/17/2015  . Arthralgia of hand  04/17/2015  . Ache in joint 04/17/2015  . Chronic kidney disease (CKD), stage III (moderate) 04/17/2015  . Fatigue 04/17/2015  . Numbness and tingling 04/17/2015  . Nonspecific abnormal finding in stool contents 04/17/2015  . RAD (reactive airway disease) with wheezing 04/17/2015  . Restless leg 04/17/2015  . Foot pain 04/17/2015  . Hypothyroidism 07/09/2014  . Abnormal mammogram 11/01/2012  . Carotid stenosis 03/28/2012  . Atrial tachycardia ([place]) 03/28/2012  . Hyperlipidemia 03/28/2012  . Hyperpotassemia 02/27/2010  . Heart murmur 03/10/2009  . Insomnia 03/05/2009  . Essential (primary) hypertension 01/29/2009  . Goiter 01/29/2009  . History of tobacco use 01/29/2009  . Spinal stenosis of lumbar region without neurogenic claudication 07/14/2003    Past Surgical History: Past Surgical History  Procedure Laterality Date  . Carotid endarterectomy Left Dr. Lucky Cowboy  . Knee replacement (other) Left 2002  . Foot and ankle surgery    . Appendectomy  1950  . Cardiac catheterization N/A 06/12/2015    Procedure: Left Heart Cath and Coronary Angiography;  Surgeon: Minna Merritts, MD;  Location: [facility];  Service: Cardiovascular;  Laterality: N/A;    Allergies: Allergies  Allergen Reactions  . Atorvastatin     Other reaction(s): Muscle Pain  . Crestor  [Rosuvastatin Calcium]     Other reaction(s): Muscle Pain  . Pravastatin Sodium     Muscle pain    Home Medications:  Prescriptions prior to admission  Medication Sig Dispense Refill Last Dose  . aspirin 81 MG tablet Take 81 mg by mouth daily.   09/28/2015 at Unknown time  . cetirizine (ZYRTEC) 10 MG tablet Take 10 mg by mouth daily.   09/28/2015 at Unknown time  . clopidogrel (PLAVIX) 75 MG tablet Take 75 mg by mouth daily.   09/28/2015 at Unknown time  . Cyanocobalamin (VITAMIN B-12 PO) Take by mouth daily.   09/28/2015 at Unknown time  . fluticasone (FLONASE) 50 MCG/ACT nasal spray INHALE ONE TO TWO SPRAY IN EACH  NOSTRIL DAILY AS NEEDED 16 g 6 prn at prn  . Fluticasone-Salmeterol (ADVAIR DISKUS) 250-50 MCG/DOSE AEPB Inhale 1 puff into the lungs every 12 (twelve) hours. 60 each 12 09/28/2015 at Unknown time  . furosemide (LASIX) 20 MG tablet Take 1 tablet (20 mg total) by mouth daily. 30 tablet 0 09/28/2015 at Unknown time  . HYDROcodone-acetaminophen (NORCO) 10-325 MG tablet Take 1 tablet by mouth every 4 (four) hours as needed. 180 tablet 0 prn at prn  . isosorbide mononitrate (IMDUR) 30 MG 24 hr tablet Take 1 tablet (30 mg total) by mouth daily. 30 tablet 11 09/28/2015 at Unknown time  . levocetirizine (XYZAL) 5 MG tablet Take 5 mg by mouth every evening.   09/27/2015 at Unknown time  . levothyroxine (SYNTHROID, LEVOTHROID) 75 MCG tablet Take 1 tablet (75 mcg total) by mouth daily. 30 tablet 6 09/28/2015 at Unknown time  . LORazepam (ATIVAN) 0.5 MG tablet Take 0.5 mg by mouth every 8 (eight) hours.   prn at prn  . meloxicam (MOBIC) 15 MG tablet Take 1 tablet (15 mg total) by mouth daily. As needed for arthritis pain 30 tablet 12 prn at prn  . metoprolol tartrate (LOPRESSOR) 50 MG tablet Take 1 tablet (50 mg total) by mouth 2 (two) times daily. 180 tablet 3 09/28/2015 at Unknown time  . Multiple Vitamin (MULTI-VITAMIN DAILY PO) Take by mouth.   09/28/2015 at Unknown time  . mupirocin ointment (BACTROBAN) 2 % Apply 1 application topically 3 (three) times daily.    09/28/2015 at Unknown time  . nitroGLYCERIN (NITROSTAT) 0.4 MG SL tablet Place 1 tablet (0.4 mg total) under the tongue every 5 (five) minutes as needed for chest pain. 25 tablet 3 prn at prn  . sertraline (ZOLOFT) 50 MG tablet 0.5 to 1 tablet daily 30 tablet 6 09/28/2015 at Unknown time  . simvastatin (ZOCOR) 20 MG tablet Take 1 tablet (20 mg total) by mouth daily at 6 PM. 90 tablet 3 09/28/2015 at Unknown time  . traZODone (DESYREL) 100 MG tablet 1/2-1 tablet at bedtime as needed 30 tablet 5 prn at prn  . valsartan-hydrochlorothiazide (DIOVAN-HCT)  320-12.5 MG per tablet Take 1 tablet by mouth daily.   09/28/2015 at Unknown time   Home medication reconciliation was completed with the patient.   Scheduled Inpatient Medications:   . aspirin EC  81 mg Oral Daily  . clopidogrel  75 mg Oral Daily  . fluticasone  1 spray Each Nare Daily  . furosemide  20 mg Oral Daily  . heparin  5,000 Units Subcutaneous 3 times per day  . irbesartan  300 mg Oral Daily   And  . hydrochlorothiazide  12.5 mg Oral Daily  . isosorbide mononitrate  30 mg Oral Daily  . levothyroxine  75 mcg Oral QAC breakfast  . loratadine  10 mg Oral Daily  . LORazepam  0.5 mg Oral 3 times per day  .  metoprolol tartrate  50 mg Oral BID  . sertraline  50 mg Oral Daily    Continuous Inpatient Infusions:   . sodium chloride 75 mL/hr at 09/29/15 0407    PRN Inpatient Medications:  acetaminophen **OR** acetaminophen, albuterol, morphine injection, nitroGLYCERIN, ondansetron **OR** ondansetron (ZOFRAN) IV, oxyCODONE  Family History: family history includes Cancer in her brother; Congestive Heart Failure (age of onset: 65) in her mother; Kidney failure in her father.    Social History:  Quit smoking 3 years ago. The patient denies ETOH, tobacco, or drug use. Last alcohol use 25 year ago.   Review of Systems: Constitutional: Weight is stable.  Eyes: No changes in vision. ENT: No oral lesions, sore throat.  GI: see HPI.  Heme/Lymph: No easy bruising.  CV: No chest pain.  GU: No hematuria.  Integumentary: No rashes.  Neuro: No headaches.  Psych: No depression/anxiety.  Endocrine: No heat/cold intolerance.  Allergic/Immunologic: No urticaria.  Resp: No cough, SOB.  Musculoskeletal: No joint swelling.    Physical Examination: BP 117/55 mmHg  Pulse 130  Temp(Src) 97.9 F (36.6 C) (Oral)  Resp 18  Ht _0  (1.575 m)  Wt 185 lb (83.915 kg)  BMI 33.83 kg/m2  SpO2 88% Gen: NAD, alert and oriented x 4 HEENT: PEERLA, EOMI, Neck: supple, no JVD or  thyromegaly Chest: CTA bilaterally, no wheezes, crackles, or other adventitious sounds CV: tachycardic, no m/g/c/r Abd: soft, TTP in EG area without rebound/guarding, ND, +BS in all four quadrants; no HSM, guarding, ridigity, or rebound tenderness Ext: no edema, well perfused with 2+ pulses, Skin: no rash or lesions noted Lymph: no LAD  Data: Lab Results  Component Value Date   WBC 8.1 09/29/2015   HGB 11.3* 09/29/2015   HCT 36.1 09/29/2015   MCV 88.7 09/29/2015   PLT 167 09/29/2015    Recent Labs Lab 09/28/15 1544 09/29/15 0430  HGB 12.6 11.3*   Lab Results  Component Value Date   NA 141 09/29/2015   K 3.9 09/29/2015   CL 112* 09/29/2015   CO2 24 09/29/2015   BUN 24* 09/29/2015   CREATININE 1.24* 09/29/2015   GLU 92 12/12/2014   Lab Results  Component Value Date   ALT 198* 09/29/2015   AST 132* 09/29/2015   ALKPHOS 153* 09/29/2015   BILITOT 1.3* 09/29/2015   No results for input(s): APTT, INR, PTT in the last 168 hours.   RUQ [place]:   There appears to be a wall echo shadow complex consistent with numerous gallstones. There is no Murphy's sign. The gallbladder wall does not appear to be thickened.  Common bile duct: Diameter: 9 mm  Liver:  No focal lesion identified. Within normal limits in parenchymal echogenicity.  IMPRESSION: Abundant cholelithiasis. No Murphy sign.  Common bile duct dilatation. Distal obstructing process including possible distal stone not excluded.  Admission labs:  Lipase-7795, AST 244, ALT 291, Tbili 4.4, AP 196  Labs 12/20: Lipase-1290, AST 132, ALT 198, T bili 1.3, AP 153. Normal WBC.   Labs 12/21: Lipase 61, AST 73, ALT 144, Tbili 1.0, AP 134, normal WBC.    Assessment/Plan: Ms. Florendo is a 79 y.o. female   1. Gallstone pancreatitis - given LFTs and lipase are drastically improving without visualized choledocholithiasis does not need ERCP. Likely passed stone. She reports pain is slightly improved since admission  although lipase normalized. N/V improved. Continue pain control and anti-emetics PRN. Start clear liquid non-carbonated diet today. Will need CCY w/ intra op cholangiogram when LFTS improve-timing  per surgery, will need Plavix held.    2. GERD - she is on home Mobic + frequent BC powder use. No PPI on med list will start while in patient, recommend continue at d/c.    Recommendations:  1. Clear liquid diet.  2. Pain controll and anti-emetics PRN 3. CCY w/ IOC per surgery (after plavix wash out)    Case discussed w/ Dr. Gustavo Lah.   Thank you for the consult. Please call with questions or concerns.  Ronney Asters, PA-C [place]

## 2015-09-29 NOTE — Consult Note (Signed)
Surgical Consultation  09/29/2015  Carolyn Hendricks is an 79 y.o. female.   CC abdominal pain  HPI: This patient with several weeks of abdominal pain worsening over the last 5-7 days. She points to the epigastrium and right upper quadrant and to her back. Quite concerned about pancreatitis because her husband died in his 53s of pancreatic cancer. She denies fevers or chills no melena or hematochezia and no dysuria.  Past Medical History  Diagnosis Date  . Heart murmur   . Head ache   . Chest pain   . Shortness of breath   . Dizziness   . High blood pressure   . Arthritis   . History of MRSA infection   . Chronic kidney disease   . CHF (congestive heart failure) [facility])     Past Surgical History  Procedure Laterality Date  . Carotid endarterectomy Left Dr. Lucky Cowboy  . Knee replacement (other) Left 2002  . Foot and ankle surgery    . Appendectomy  1950  . Cardiac catheterization N/A 06/12/2015    Procedure: Left Heart Cath and Coronary Angiography;  Surgeon: Minna Merritts, MD;  Location: [facility];  Service: Cardiovascular;  Laterality: N/A;    Family History  Problem Relation Age of Onset  . Congestive Heart Failure Mother 46  . Cancer Brother   . Kidney failure Father     Social History:  reports that she quit smoking about 3 years ago. Her smoking use included Cigarettes. She has a 26 pack-year smoking history. She does not have any smokeless tobacco history on file. She reports that she does not drink alcohol or use illicit drugs.  Allergies:  Allergies  Allergen Reactions  . Atorvastatin     Other reaction(s): Muscle Pain  . Crestor  [Rosuvastatin Calcium]     Other reaction(s): Muscle Pain  . Pravastatin Sodium     Muscle pain    Medications reviewed.   Review of Systems:   Review of Systems  Constitutional: Negative.   HENT: Negative.   Eyes: Negative.   Respiratory: Negative.   Cardiovascular: Negative.   Gastrointestinal: Positive for nausea  and abdominal pain. Negative for heartburn, vomiting, diarrhea, constipation, blood in stool and melena.  Genitourinary: Negative.   Musculoskeletal: Negative.   Skin: Negative.   Neurological: Negative.   Endo/Heme/Allergies: Negative.   Psychiatric/Behavioral: Negative.      Physical Exam:  BP 128/62 mmHg  Pulse 128  Temp(Src) 98.9 F (37.2 C) (Oral)  Resp 17  Ht _0  (1.575 m)  Wt 185 lb (83.915 kg)  BMI 33.83 kg/m2  SpO2 94%  Physical Exam  Constitutional: She is oriented to person, place, and time and well-developed, well-nourished, and in no distress. No distress.  Foul odor emanating from the patient's skin On supplemental oxygen  HENT:  Head: Normocephalic and atraumatic.  Eyes: Pupils are equal, round, and reactive to light. Right eye exhibits no discharge. Left eye exhibits no discharge. No scleral icterus.  Neck: Normal range of motion. Neck supple.  Cardiovascular: Normal rate, regular rhythm and normal heart sounds.   Pulmonary/Chest: Effort normal and breath sounds normal. No respiratory distress. She has no wheezes.  Abdominal: Soft. She exhibits no distension. There is tenderness. There is no rebound and no guarding.  Tender in the epigastrium  Musculoskeletal: Normal range of motion. She exhibits no edema.  Lymphadenopathy:    She has no cervical adenopathy.  Neurological: She is alert and oriented to person, place, and time.  Skin: Skin is warm and dry. She is not diaphoretic.  Psychiatric: Mood and affect normal.      Results for orders placed or performed during the hospital encounter of 09/28/15 (from the past 48 hour(s))  Comprehensive metabolic panel     Status: Abnormal   Collection Time: 09/28/15  3:44 PM  Result Value Ref Range   Sodium 137 135 - 145 mmol/L   Potassium 4.2 3.5 - 5.1 mmol/L   Chloride 106 101 - 111 mmol/L   CO2 23 22 - 32 mmol/L   Glucose, Bld 132 (H) 65 - 99 mg/dL   BUN 24 (H) 6 - 20 mg/dL   Creatinine, Ser 1.28 (H)  0.44 - 1.00 mg/dL   Calcium 9.5 8.9 - 10.3 mg/dL   Total Protein 7.6 6.5 - 8.1 g/dL   Albumin 4.3 3.5 - 5.0 g/dL   AST 244 (H) 15 - 41 U/L   ALT 291 (H) 14 - 54 U/L   Alkaline Phosphatase 196 (H) 38 - 126 U/L   Total Bilirubin 4.4 (H) 0.3 - 1.2 mg/dL   GFR calc non Af Amer 39 (L) >60 mL/min   GFR calc Af Amer 46 (L) >60 mL/min    Comment: (NOTE) The eGFR has been calculated using the CKD EPI equation. This calculation has not been validated in all clinical situations. eGFR's persistently <60 mL/min signify possible Chronic Kidney Disease.    Anion gap 8 5 - 15  CBC with Differential     Status: Abnormal   Collection Time: 09/28/15  3:44 PM  Result Value Ref Range   WBC 7.5 3.6 - 11.0 K/uL   RBC 4.55 3.80 - 5.20 MIL/uL   Hemoglobin 12.6 12.0 - 16.0 g/dL   HCT 39.8 35.0 - 47.0 %   MCV 87.4 80.0 - 100.0 fL   MCH 27.7 26.0 - 34.0 pg   MCHC 31.8 (L) 32.0 - 36.0 g/dL   RDW 15.4 (H) 11.5 - 14.5 %   Platelets 193 150 - 440 K/uL   Neutrophils Relative % 87 %   Neutro Abs 6.5 1.4 - 6.5 K/uL   Lymphocytes Relative 7 %   Lymphs Abs 0.5 (L) 1.0 - 3.6 K/uL   Monocytes Relative 5 %   Monocytes Absolute 0.4 0.2 - 0.9 K/uL   Eosinophils Relative 1 %   Eosinophils Absolute 0.0 0 - 0.7 K/uL   Basophils Relative 0 %   Basophils Absolute 0.0 0 - 0.1 K/uL  Lipase, blood     Status: Abnormal   Collection Time: 09/28/15  3:44 PM  Result Value Ref Range   Lipase 7795 (H) 11 - 51 U/L    Comment: RESULT CONFIRMED BY MANUAL DILUTION  Urinalysis complete, with microscopic ([facility] only)     Status: Abnormal   Collection Time: 09/28/15  7:00 PM  Result Value Ref Range   Color, Urine AMBER (A) YELLOW   APPearance CLEAR (A) CLEAR   Glucose, UA NEGATIVE NEGATIVE mg/dL   Bilirubin Urine 2+ (A) NEGATIVE   Ketones, ur NEGATIVE NEGATIVE mg/dL   Specific Gravity, Urine 1.028 1.005 - 1.030   Hgb urine dipstick 1+ (A) NEGATIVE   pH 5.0 5.0 - 8.0   Protein, ur 100 (A) NEGATIVE mg/dL   Nitrite NEGATIVE  NEGATIVE   Leukocytes, UA 1+ (A) NEGATIVE   RBC / HPF 6-30 0 - 5 RBC/hpf   WBC, UA 6-30 0 - 5 WBC/hpf   Bacteria, UA NONE SEEN NONE SEEN   Squamous Epithelial /  LPF 0-5 (A) NONE SEEN   Mucous PRESENT   Magnesium     Status: None   Collection Time: 09/28/15 10:00 PM  Result Value Ref Range   Magnesium 1.9 1.7 - 2.4 mg/dL  CBC     Status: Abnormal   Collection Time: 09/29/15  4:30 AM  Result Value Ref Range   WBC 8.1 3.6 - 11.0 K/uL   RBC 4.07 3.80 - 5.20 MIL/uL   Hemoglobin 11.3 (L) 12.0 - 16.0 g/dL   HCT 36.1 35.0 - 47.0 %   MCV 88.7 80.0 - 100.0 fL   MCH 27.8 26.0 - 34.0 pg   MCHC 31.4 (L) 32.0 - 36.0 g/dL   RDW 15.6 (H) 11.5 - 14.5 %   Platelets 167 150 - 440 K/uL  Lipase, blood     Status: Abnormal   Collection Time: 09/29/15  4:30 AM  Result Value Ref Range   Lipase 1290 (H) 11 - 51 U/L    Comment: RESULT CONFIRMED BY MANUAL DILUTION  Comprehensive metabolic panel     Status: Abnormal   Collection Time: 09/29/15  4:30 AM  Result Value Ref Range   Sodium 141 135 - 145 mmol/L   Potassium 3.9 3.5 - 5.1 mmol/L   Chloride 112 (H) 101 - 111 mmol/L   CO2 24 22 - 32 mmol/L   Glucose, Bld 80 65 - 99 mg/dL   BUN 24 (H) 6 - 20 mg/dL   Creatinine, Ser 1.24 (H) 0.44 - 1.00 mg/dL   Calcium 9.0 8.9 - 10.3 mg/dL   Total Protein 6.5 6.5 - 8.1 g/dL   Albumin 3.6 3.5 - 5.0 g/dL   AST 132 (H) 15 - 41 U/L   ALT 198 (H) 14 - 54 U/L   Alkaline Phosphatase 153 (H) 38 - 126 U/L   Total Bilirubin 1.3 (H) 0.3 - 1.2 mg/dL   GFR calc non Af Amer 41 (L) >60 mL/min   GFR calc Af Amer 47 (L) >60 mL/min    Comment: (NOTE) The eGFR has been calculated using the CKD EPI equation. This calculation has not been validated in all clinical situations. eGFR's persistently <60 mL/min signify possible Chronic Kidney Disease.    Anion gap 5 5 - 15   US Abdomen Limited Ruq  09/28/2015  CLINICAL DATA:  Right upper quadrant pain for 5 days EXAM: US ABDOMEN LIMITED - RIGHT UPPER QUADRANT COMPARISON:   None. FINDINGS: Gallbladder: There appears to be a wall echo shadow complex consistent with numerous gallstones. There is no Murphy's sign. The gallbladder wall does not appear to be thickened. Common bile duct: Diameter: 9 mm Liver: No focal lesion identified. Within normal limits in parenchymal echogenicity. IMPRESSION: Abundant cholelithiasis.  No Murphy sign. Common bile duct dilatation. Distal obstructing process including possible distal stone not excluded. Electronically Signed   By: Skipper Cliche M.D.   On: 09/28/2015 19:37    Assessment/Plan:  Biliary pancreatitis with slow improvement of the lipase is now down to 1290. Recommend continued observation and ultimately will need laparoscopic cholecystectomy with cholangiography once her liver function tests and lipase returned to normal. We'll continue to follow in the hospital  Florene Glen, MD, FACS

## 2015-09-29 NOTE — Progress Notes (Signed)
[facility] - [place] at [facility]   PATIENT NAME: Carolyn Hendricks    MR#:  [id number]  DATE OF BIRTH:  04/30/1936  SUBJECTIVE:  Patient presented with nausea vomiting abdominal pain for last 3 days. She is better at present. Minimal vomiting since admission. She does have diffuse epigastric abdominal pain.  REVIEW OF SYSTEMS:   Review of Systems  Constitutional: Negative for fever, chills and weight loss.  HENT: Negative for ear discharge, ear pain and nosebleeds.   Eyes: Negative for blurred vision, pain and discharge.  Respiratory: Negative for sputum production, shortness of breath, wheezing and stridor.   Cardiovascular: Negative for chest pain, palpitations, orthopnea and PND.  Gastrointestinal: Positive for nausea, vomiting and abdominal pain. Negative for diarrhea.  Genitourinary: Negative for urgency and frequency.  Musculoskeletal: Negative for back pain and joint pain.  Neurological: Positive for weakness. Negative for sensory change, speech change and focal weakness.  Psychiatric/Behavioral: Negative for depression and hallucinations. The patient is not nervous/anxious.   All other systems reviewed and are negative.  Tolerating Diet: Nothing by mouth Tolerating PT: Pending  DRUG ALLERGIES:   Allergies  Allergen Reactions  . Atorvastatin     Other reaction(s): Muscle Pain  . Crestor  [Rosuvastatin Calcium]     Other reaction(s): Muscle Pain  . Pravastatin Sodium     Muscle pain    VITALS:  Blood pressure 117/55, pulse 130, temperature 97.9 F (36.6 C), temperature source Oral, resp. rate 18, height 5\' 2"  (1.575 m), weight 83.915 kg (185 lb), SpO2 88 %.  PHYSICAL EXAMINATION:   Physical Exam  GENERAL:  79 y.o.-year-old patient lying in the bed with no acute distress.  EYES: Pupils equal, round, reactive to light and accommodation. No scleral icterus. Extraocular muscles intact.  HEENT: Head atraumatic, normocephalic. Oropharynx and  nasopharynx clear.  NECK:  Supple, no jugular venous distention. No thyroid enlargement, no tenderness.  LUNGS: Normal breath sounds bilaterally, no wheezing, rales, rhonchi. No use of accessory muscles of respiration.  CARDIOVASCULAR: S1, S2 normal. No murmurs, rubs, or gallops.  ABDOMEN: Soft, diffuse tenderness over the upper abdomen - nondistended. Bowel sounds present. No organomegaly or mass.  EXTREMITIES: No cyanosis, clubbing or edema b/l.    NEUROLOGIC: Cranial nerves II through XII are intact. No focal Motor or sensory deficits b/l.   PSYCHIATRIC:  patient is alert and oriented x 3.  SKIN: No obvious rash, lesion, or ulcer.    LABORATORY PANEL:   CBC  Recent Labs Lab 09/29/15 0430  WBC 8.1  HGB 11.3*  HCT 36.1  PLT 167    Chemistries   Recent Labs Lab 09/28/15 2200 09/29/15 0430  NA  --  141  K  --  3.9  CL  --  112*  CO2  --  24  GLUCOSE  --  80  BUN  --  24*  CREATININE  --  1.24*  CALCIUM  --  9.0  MG 1.9  --   AST  --  132*  ALT  --  198*  ALKPHOS  --  153*  BILITOT  --  1.3*    Cardiac Enzymes No results for input(s): TROPONINI in the last 168 hours.  RADIOLOGY:  US Abdomen Limited Ruq  09/28/2015  CLINICAL DATA:  Right upper quadrant pain for 5 days EXAM: US ABDOMEN LIMITED - RIGHT UPPER QUADRANT COMPARISON:  None. FINDINGS: Gallbladder: There appears to be a wall echo shadow complex consistent with numerous gallstones. There is no Murphy's  sign. The gallbladder wall does not appear to be thickened. Common bile duct: Diameter: 9 mm Liver: No focal lesion identified. Within normal limits in parenchymal echogenicity. IMPRESSION: Abundant cholelithiasis.  No Murphy sign. Common bile duct dilatation. Distal obstructing process including possible distal stone not excluded. Electronically Signed   By: Esperanza Heir M.D.   On: 09/28/2015 19:37    ASSESSMENT AND PLAN:  Carolyn Hendricks is a 79 y.o. female with a known history of CHF, CAD and  hypertension. The patient to present to the ED with the abdominal pain with nausea and vomiting for 3 days. Abdominal pain is diffuse, constant, 10 out of 10 with radiation to back. Patient also has associated nausea and vomiting. She also complains of dark urine  1. Acute pancreatitis suspected due to cholelithiasis and possible choledocholithiasis Keep nothing by mouth, IV fluid support, pain control, Zofran when necessary. Follow-up lipase level. Surgical consultation. Await GI consultation.  2. Cholelithiasis with possible common bile duct obstruction Follow-up CMP and GI consult, Dr. Marva Panda  -Patient's LFTs show improvement her bilirubin is down from 4.4-1.4. Is a possibility she may have passed a stone. We'll await further recommendations after GI sees patient.  3. Elevated bilirubin and Abnormal liver function test. As mentioned above.  4. Dehydration. IV fluid support and follow-up BMP.  5. Hypertension. Continue hypertension medication.  6. History of chronic CHF, unknown type, stable. Patient is not in heart failure.   Case discussed with Care Management/Social Worker. Management plans discussed with the patient, family and they are in agreement.  CODE STATUS: Full  DVT Prophylaxis: Lovenox  TOTAL TIME TAKING CARE OF THIS PATIENT: 35 minutes.  >50% time spent on counselling and coordination of care  POSSIBLE D/C IN one to 2 DAYS, DEPENDING ON CLINICAL CONDITION.   Tranae Laramie M.D on 09/29/2015 at 3:09 PM  Between 7am to 6pm - Pager - [phone]  After 6pm go to [email or web address] - password [email or web address] [email or web address]  [facility]  Office  [phone]  CC: Primary care physician; Mila Merry, MD

## 2015-09-29 NOTE — Progress Notes (Signed)
MD notified regarding pt.'s Ibesartan, hydrochlorothiazide, Imdur, lopressor, and lasix. Pt.'s BP 116/78, pulse 100. MD told RN to hold medications at this time. Will continue to monitor pt.   Angus Seller

## 2015-09-30 ENCOUNTER — Encounter: Payer: Self-pay | Admitting: Physician Assistant

## 2015-09-30 DIAGNOSIS — I471 Supraventricular tachycardia: Secondary | ICD-10-CM

## 2015-09-30 LAB — CBC
HCT: 38.7 % (ref 35.0–47.0)
Hemoglobin: 11.9 g/dL — ABNORMAL LOW (ref 12.0–16.0)
MCH: 27.7 pg (ref 26.0–34.0)
MCHC: 30.8 g/dL — AB (ref 32.0–36.0)
MCV: 90 fL (ref 80.0–100.0)
PLATELETS: 154 10*3/uL (ref 150–440)
RBC: 4.3 MIL/uL (ref 3.80–5.20)
RDW: 16 % — AB (ref 11.5–14.5)
WBC: 9.6 10*3/uL (ref 3.6–11.0)

## 2015-09-30 LAB — COMPREHENSIVE METABOLIC PANEL
ALT: 144 U/L — AB (ref 14–54)
AST: 73 U/L — AB (ref 15–41)
Albumin: 3.6 g/dL (ref 3.5–5.0)
Alkaline Phosphatase: 134 U/L — ABNORMAL HIGH (ref 38–126)
Anion gap: 8 (ref 5–15)
BILIRUBIN TOTAL: 1 mg/dL (ref 0.3–1.2)
BUN: 29 mg/dL — AB (ref 6–20)
CO2: 21 mmol/L — ABNORMAL LOW (ref 22–32)
CREATININE: 1.33 mg/dL — AB (ref 0.44–1.00)
Calcium: 9.1 mg/dL (ref 8.9–10.3)
Chloride: 111 mmol/L (ref 101–111)
GFR calc Af Amer: 43 mL/min — ABNORMAL LOW (ref >60)
GFR, EST NON AFRICAN AMERICAN: 37 mL/min — AB (ref >60)
Glucose, Bld: 64 mg/dL — ABNORMAL LOW (ref 65–99)
Potassium: 4.3 mmol/L (ref 3.5–5.1)
Sodium: 140 mmol/L (ref 135–145)
TOTAL PROTEIN: 6.5 g/dL (ref 6.5–8.1)

## 2015-09-30 LAB — TSH: TSH: 0.518 u[IU]/mL (ref 0.350–4.500)

## 2015-09-30 LAB — LIPASE, BLOOD: LIPASE: 61 U/L — AB (ref 11–51)

## 2015-09-30 LAB — MAGNESIUM: MAGNESIUM: 1.8 mg/dL (ref 1.7–2.4)

## 2015-09-30 MED ORDER — PANTOPRAZOLE SODIUM 40 MG PO TBEC
40.0000 mg | DELAYED_RELEASE_TABLET | Freq: Every day | ORAL | Status: DC
  Administered 2015-09-30 – 2015-10-02 (×3): 40 mg via ORAL
  Filled 2015-09-30 (×3): qty 1

## 2015-09-30 MED ORDER — METOPROLOL TARTRATE 25 MG PO TABS: 25.0000 mg | ORAL_TABLET | Freq: Two times a day (BID) | ORAL | Status: DC | PRN

## 2015-09-30 MED ORDER — DEXTROSE 5 % IV SOLN
1.0000 g | INTRAVENOUS | Status: DC
  Administered 2015-09-30 – 2015-10-01 (×2): 1 g via INTRAVENOUS
  Filled 2015-09-30 (×2): qty 10

## 2015-09-30 NOTE — Care Management Important Message (Signed)
Important Message  Patient Details  Name: Carolyn Hendricks MRN: [id number] Date of Birth: 27-Nov-1935   Medicare Important Message Given:  Yes    Juliann Pulse A Perla Echavarria 09/30/2015, 1:50 PM

## 2015-09-30 NOTE — Progress Notes (Addendum)
[place] at [place] NAME: Carolyn Hendricks    MR#:  [id number]  DATE OF BIRTH:  Jan 05, 1936  SUBJECTIVE:  Patient presented with nausea vomiting abdominal pain for last 3 days.  No nausea or vomiting but is still epigastric abdominal pain. On clear liquid diet.  REVIEW OF SYSTEMS:   Review of Systems  Constitutional: Negative for fever, chills and weight loss.  HENT: Negative for ear discharge, ear pain and nosebleeds.   Eyes: Negative for blurred vision, pain and discharge.  Respiratory: Negative for sputum production, shortness of breath, wheezing and stridor.   Cardiovascular: Negative for chest pain, palpitations, orthopnea and PND.  Gastrointestinal: Positive for abdominal pain. Negative for nausea, vomiting and diarrhea.  Genitourinary: Negative for urgency and frequency.  Musculoskeletal: Negative for back pain and joint pain.  Neurological: Positive for weakness. Negative for sensory change, speech change and focal weakness.  Psychiatric/Behavioral: Negative for depression and hallucinations. The patient is not nervous/anxious.   All other systems reviewed and are negative.  Tolerating Diet: Nothing by mouth Tolerating PT: Pending  DRUG ALLERGIES:   Allergies  Allergen Reactions  . Atorvastatin     Other reaction(s): Muscle Pain  . Crestor  [Rosuvastatin Calcium]     Other reaction(s): Muscle Pain  . Pravastatin Sodium     Muscle pain    VITALS:  Blood pressure 92/48, pulse 124, temperature 98.3 F (36.8 C), temperature source Oral, resp. rate 18, height 5' 2" (1.575 m), weight 83.915 kg (185 lb), SpO2 90 %.  PHYSICAL EXAMINATION:   Physical Exam  GENERAL:  79 y.o.-year-old patient lying in the bed with no acute distress. Obese. EYES: Pupils equal, round, reactive to light and accommodation. No scleral icterus. Extraocular muscles intact.  HEENT: Head atraumatic, normocephalic. Oropharynx and nasopharynx clear.   NECK:  Supple, no jugular venous distention. No thyroid enlargement, no tenderness.  LUNGS: Normal breath sounds bilaterally, no wheezing, rales, rhonchi. No use of accessory muscles of respiration.  CARDIOVASCULAR: S1, S2 normal. No murmurs, rubs, or gallops.  ABDOMEN: Soft, diffuse tenderness over the upper abdomen - nondistended. Bowel sounds present. No organomegaly or mass.  EXTREMITIES: No cyanosis, clubbing or edema b/l.    NEUROLOGIC: Cranial nerves II through XII are intact. No focal Motor or sensory deficits b/l.   PSYCHIATRIC:  patient is alert and oriented x 3.  SKIN: No obvious rash, lesion, or ulcer.    LABORATORY PANEL:   CBC  Recent Labs Lab 09/30/15 0510  WBC 9.6  HGB 11.9*  HCT 38.7  PLT 154    Chemistries   Recent Labs Lab 09/28/15 2200  09/30/15 0510  NA  --   < > 140  K  --   < > 4.3  CL  --   < > 111  CO2  --   < > 21*  GLUCOSE  --   < > 64*  BUN  --   < > 29*  CREATININE  --   < > 1.33*  CALCIUM  --   < > 9.1  MG 1.9  --   --   AST  --   < > 73*  ALT  --   < > 144*  ALKPHOS  --   < > 134*  BILITOT  --   < > 1.0  < > = values in this interval not displayed.  Cardiac Enzymes No results for input(s): TROPONINI in the last 168 hours.  RADIOLOGY:  US Abdomen Limited Ruq  09/28/2015  CLINICAL DATA:  Right upper quadrant pain for 5 days EXAM: US ABDOMEN LIMITED - RIGHT UPPER QUADRANT COMPARISON:  None. FINDINGS: Gallbladder: There appears to be a wall echo shadow complex consistent with numerous gallstones. There is no Murphy's sign. The gallbladder wall does not appear to be thickened. Common bile duct: Diameter: 9 mm Liver: No focal lesion identified. Within normal limits in parenchymal echogenicity. IMPRESSION: Abundant cholelithiasis.  No Murphy sign. Common bile duct dilatation. Distal obstructing process including possible distal stone not excluded. Electronically Signed   By: Skipper Cliche M.D.   On: 09/28/2015 19:37    ASSESSMENT AND  PLAN:  Carolyn Hendricks is a 79 y.o. female with a known history of CHF, CAD and hypertension. The patient to present to the ED with the abdominal pain with nausea and vomiting for 3 days. Abdominal pain is diffuse, constant, 10 out of 10 with radiation to back. Patient also has associated nausea and vomiting. She also complains of dark urine  1. Acute pancreatitis suspected due to cholelithiasis and possible choledocholithiasis Continue clear liquid diet, IV fluid support, pain control, Zofran when necessary.  lipase level down to normal.  2. Cholelithiasis with possible common bile duct obstruction -Patient's LFTs show improvement her bilirubin is down from 4.4-1.0. There is a possibility she may have passed a stone. Continue Rocephin. Per Surgical consultation, Dr. Marina Gravel, the patient is not ready for any type of surgical intervention and she is continuing to have pain and tachycardia. Per GI consultation, Dr. Gustavo Lah, no need for ERCP.  3. Elevated bilirubin and Abnormal liver function test. As mentioned above.  4. ARF due to Dehydration. Continue IV fluid support and follow-up CMP.  5. Hypertension. hold hypertension medication except for Lopressor due to low side blood pressure..  6. History of chronic CHF, unknown type, stable. Patient is not in heart failure.  * Tachycardia. Possible due to pain and dehydration, Cardiology consult for tachycardia and preoperation clearance.  Discussed withDr. Gustavo Lah and Dr. Marina Gravel. Case discussed with Care Management/Social Worker. Management plans discussed with the patient, family and they are in agreement.  CODE STATUS: Full  DVT Prophylaxis: Lovenox  TOTAL TIME TAKING CARE OF THIS PATIENT: 43 minutes.  >50% time spent on counselling and coordination of care  POSSIBLE D/C IN one to 2 DAYS, DEPENDING ON CLINICAL CONDITION.   Demetrios Loll M.D on 09/30/2015 at 2:35 PM  Between 7am to 6pm - Pager - [phone]  After 6pm go to  [email or web address] - password [email or web address] [facility]  Office  [phone]  CC: Primary care physician; [name], MD

## 2015-09-30 NOTE — Progress Notes (Signed)
Surgery  Patient continuing with some epigastric pain. No nausea no vomiting tolerating clear liquid diet. Lipase is now normalized.  Case discussed with Dr. Burt Knack this morning personally.  Images have been reviewed.  Filed Vitals:   09/29/15 1735 09/29/15 2048 09/30/15 0554 09/30/15 1007  BP:  128/62 104/66 120/60  Pulse: 120 128 121   Temp:  98.9 F (37.2 C) 98.4 F (36.9 C)   TempSrc:  Oral Oral   Resp:  17 17   Height:      Weight:      SpO2: 95% 94% 97%     Physical examination the patient's abdomen is soft with some tenderness in the epigastrium.    CMP Latest Ref Rng 09/30/2015 09/29/2015 09/28/2015  Glucose 65 - 99 mg/dL 64(L) 80 132(H)  BUN 6 - 20 mg/dL 29(H) 24(H) 24(H)  Creatinine 0.44 - 1.00 mg/dL 1.33(H) 1.24(H) 1.28(H)  Sodium 135 - 145 mmol/L 140 141 137  Potassium 3.5 - 5.1 mmol/L 4.3 3.9 4.2  Chloride 101 - 111 mmol/L 111 112(H) 106  CO2 22 - 32 mmol/L 21(L) 24 23  Calcium 8.9 - 10.3 mg/dL 9.1 9.0 9.5  Total Protein 6.5 - 8.1 g/dL 6.5 6.5 7.6  Total Bilirubin 0.3 - 1.2 mg/dL 1.0 1.3(H) 4.4(H)  Alkaline Phos 38 - 126 U/L 134(H) 153(H) 196(H)  AST 15 - 41 U/L 73(H) 132(H) 244(H)  ALT 14 - 54 U/L 144(H) 198(H) 291(H)    Impression resolving gallstone pancreatitis.  Tachycardia perhaps secondary to pain.    Plan the patient is not ready for any type of surgical intervention and she is continuing to have pain. Etiology of her tachycardia needs to be elucidated.

## 2015-09-30 NOTE — Consult Note (Signed)
Please see full GI consult by Ms Constance Haw. Patient seen and examined, chart reviewed.  Patietn admitted with biliary pancreatitis. Though patient with some epigastric pan, this and her labs  has improved since admission.  Patietn states she has had other episodes of similar sx on several occasions over the past couple months, this episode being the worst.  Agree with CCY and IOC when clinically feasible, continue ppi po. Will follow with you.

## 2015-09-30 NOTE — Care Management (Signed)
Discussed with attending.Dr Bridgett Larsson,  Patient has surgical consult. May need surgical intervention no decision yet.

## 2015-09-30 NOTE — Consult Note (Signed)
Cardiology Consultation Note  Patient ID: Carolyn Hendricks, MRN: [id number], DOB/AGE: 77-15-1937 77 y.o. Admit date: 09/28/2015   Date of Consult: 09/30/2015 Primary Physician: [name], MD Primary Cardiologist: Dr. Rockey Situ, MD  Chief Complaint: Abdominal pain, nausea, vomiting Reason for Consult: Pre-operative clearance   HPI: 79 y.o. female with h/o nonobstructive CAD by cardiac cath in 06/2015 as detailed below, bilateral carotid arterial disease s/p left carotid endarterectomy, PAD, atrial tachycardia, CKD stage II, HTN, tobacco abuse, and MRSA who presented to [facility] on 12/19 with abdominal pain and was found to have acute pancreatitis suspected 2/2 cholelithiasis and possibly choledocholithiasis.  She was last seen in clinic on 09/08/2015 by Dr. Fletcher Anon at the request of Dr. Rockey Situ for her PAD which has been managed medically at this time given her comorbidities. She has known atrial tachycardia with heart rate of 150 bpm which has been previously resolved with calcium channel blocker and beta blocker, but the Cardizem had to be held 2/2 dizziness, lightheadedness, and bradycardia. In early September she was noted to have chest pain concerning for angina. She underwent cardiac cath that showed no significant CAD. While in the cath holding area in September she was noted to have runs of atrial tach. Her metoprolol was increased to 50 mg bid with an extra 25 mg prn for breakthrough tachycardia vs titrating up to 75 mg bid if she continued to have episodes.    Patient presented to [facility] on 12/19 with 3 day history of abdominal pain, nausea, and vomiting. Pain was diffuse, radiating to the back and 10/10. She was without fever, chills, melena, BRBPR, or hematuria. She has had decreased PO intake for the week leading up to her admission. No chest pain or palpitations. Upon the patient's arrival to [facility] they were found to have a lipase of 7795-->1290-->61, T bili 4.4-->1.3-->1.0, AST 244-->132-->73, ALT  291-->198-->144, alk phos 196-->153-->134, SCr 1.28-->1.33, WBC 7.5. ECG showed NSR, 62 bpm, left axis deviation, LBBB (old), US abdomen showed common bile duct dilatation, distal obstructing process including possible distal stone not excluded, negative Murphy's sign. She was seen by GI s/p improvement of symptoms. It was felt no ERCP was needed. Surgery was consulted for evaluation who recommended observation. She was continuing to have pain along with sinus tachycardia. They have asked cardiology for input regarding her tachycardia. Not on telemetry for review.      Past Medical History  Diagnosis Date  . Heart murmur   . Head ache   . Chest pain   . Shortness of breath   . Dizziness   . High blood pressure   . Arthritis   . History of MRSA infection   . Chronic kidney disease   . Diastolic dysfunction       Most Recent Cardiac Studies: Left Heart Cath 06/12/2015: Procedural Findings:  Coronary angiography:  Coronary dominance:Left   Left mainstem: Very short left main that quickly bifurcates to the LAD and left circumflex . No significant ostial disease noted  Left anterior descending (LAD): Large vessel that extends around the apical region . There is mild-to-moderate disease estimated at 50% prior to and at the D1 takeoff .   Left circumflex (LCx): Large vessel with several OM branches, mild luminal irregularities mid to distally   Right coronary artery (RCA): Small nondominant vessel with no significant disease   Left ventriculography: Left ventricular systolic function is normal, LVEF is estimated at 55-65%, there is no significant mitral regurgitation . No significant aortic valve stenosis  Final Conclusions:  No hemodynamically significant coronary lesions, mild to moderate disease of the proximal LAD, normal ejection fraction with no significant aortic valve stenosis significant right common iliac arterial disease as well as distal aorta disease.   She had atrial  tachycardia with heart rate 120 bpm that occurred prior to the start of the case. Metoprolol 5 mg IV was given 2 and after half an hour, converted back to normal sinus rhythm. Short run of atrial tachycardia during the case. This resolved without intervention  Recommendations:  --- No significant coronary lesions that would explain her anginal symptoms, shortness of breath.  I suspect her symptoms could be coming from frequent episodes of paroxysmal atrial tachycardia as seen before and during the case today, heart rate up to 120 bpm or higher. She was symptomatic during these episodes. We'll suggest she increase her metoprolol up to 50 mg twice a day. Close outpatient follow-up  She does have some leg discomfort and we will arrange outpatient lower extremity Doppler, focusing on iliac artery and distal aorta.  Echo 2015: EF 55-60%, DD, mild LVH, mild MR/TR   Surgical History:  Past Surgical History  Procedure Laterality Date  . Carotid endarterectomy Left Dr. Lucky Cowboy  . Knee replacement (other) Left 2002  . Foot and ankle surgery    . Appendectomy  1950  . Cardiac catheterization N/A 06/12/2015    Procedure: Left Heart Cath and Coronary Angiography;  Surgeon: Minna Merritts, MD;  Location: [place] [facility];  Service: Cardiovascular;  Laterality: N/A;     Home Meds: Prior to Admission medications   Medication Sig Start Date End Date Taking? Authorizing Provider  aspirin 81 MG tablet Take 81 mg by mouth daily.   Yes Historical Provider, MD  cetirizine (ZYRTEC) 10 MG tablet Take 10 mg by mouth daily.   Yes Historical Provider, MD  clopidogrel (PLAVIX) 75 MG tablet Take 75 mg by mouth daily.   Yes Historical Provider, MD  Cyanocobalamin (VITAMIN B-12 PO) Take by mouth daily.   Yes Historical Provider, MD  fluticasone (FLONASE) 50 MCG/ACT nasal spray INHALE ONE TO TWO SPRAY IN EACH NOSTRIL DAILY AS NEEDED 09/17/15  Yes Birdie Sons, MD  Fluticasone-Salmeterol (ADVAIR DISKUS) 250-50  MCG/DOSE AEPB Inhale 1 puff into the lungs every 12 (twelve) hours. 06/29/15  Yes Birdie Sons, MD  furosemide (LASIX) 20 MG tablet Take 1 tablet (20 mg total) by mouth daily. 04/29/15  Yes Birdie Sons, MD  HYDROcodone-acetaminophen (NORCO) 10-325 MG tablet Take 1 tablet by mouth every 4 (four) hours as needed. 09/07/15  Yes Birdie Sons, MD  isosorbide mononitrate (IMDUR) 30 MG 24 hr tablet Take 1 tablet (30 mg total) by mouth daily. 05/14/15  Yes Minna Merritts, MD  levocetirizine (XYZAL) 5 MG tablet Take 5 mg by mouth every evening.   Yes Historical Provider, MD  levothyroxine (SYNTHROID, LEVOTHROID) 75 MCG tablet Take 1 tablet (75 mcg total) by mouth daily. 04/17/15  Yes Birdie Sons, MD  LORazepam (ATIVAN) 0.5 MG tablet Take 0.5 mg by mouth every 8 (eight) hours.   Yes Historical Provider, MD  meloxicam (MOBIC) 15 MG tablet Take 1 tablet (15 mg total) by mouth daily. As needed for arthritis pain 05/25/15  Yes Birdie Sons, MD  metoprolol tartrate (LOPRESSOR) 50 MG tablet Take 1 tablet (50 mg total) by mouth 2 (two) times daily. 06/12/15  Yes Minna Merritts, MD  Multiple Vitamin (MULTI-VITAMIN DAILY PO) Take by mouth.  Yes Historical Provider, MD  mupirocin ointment (BACTROBAN) 2 % Apply 1 application topically 3 (three) times daily.  12/10/13  Yes Historical Provider, MD  nitroGLYCERIN (NITROSTAT) 0.4 MG SL tablet Place 1 tablet (0.4 mg total) under the tongue every 5 (five) minutes as needed for chest pain. 05/14/15  Yes Minna Merritts, MD  sertraline (ZOLOFT) 50 MG tablet 0.5 to 1 tablet daily 06/18/15  Yes Birdie Sons, MD  simvastatin (ZOCOR) 20 MG tablet Take 1 tablet (20 mg total) by mouth daily at 6 PM. 07/06/15  Yes Minna Merritts, MD  traZODone (DESYREL) 100 MG tablet 1/2-1 tablet at bedtime as needed 06/24/15  Yes Birdie Sons, MD  valsartan-hydrochlorothiazide (DIOVAN-HCT) 320-12.5 MG per tablet Take 1 tablet by mouth daily.   Yes Historical Provider, MD    Inpatient  Medications:  . cefTRIAXone (ROCEPHIN)  IV  1 g Intravenous Q24H  . fluticasone  1 spray Each Nare Daily  . heparin  5,000 Units Subcutaneous 3 times per day  . levothyroxine  75 mcg Oral QAC breakfast  . loratadine  10 mg Oral Daily  . LORazepam  0.5 mg Oral 3 times per day  . metoprolol tartrate  50 mg Oral BID  . pantoprazole  40 mg Oral Daily  . sertraline  50 mg Oral Daily   . sodium chloride      Allergies:  Allergies  Allergen Reactions  . Atorvastatin     Other reaction(s): Muscle Pain  . Crestor  [Rosuvastatin Calcium]     Other reaction(s): Muscle Pain  . Pravastatin Sodium     Muscle pain    Social History   Social History  . Marital Status: Widowed    Spouse Name: N/A  . Number of Children: N/A  . Years of Education: 12   Occupational History  . retired    Social History Main Topics  . Smoking status: Former Smoker -- 1.00 packs/day for 26 years    Types: Cigarettes    Quit date: 10/11/2011  . Smokeless tobacco: Not on file     Comment: Patient has smoked for 26 years; has quit but now started back 0.25 ppw.  . Alcohol Use: No  . Drug Use: No  . Sexual Activity: Not on file   Other Topics Concern  . Not on file   Social History Narrative     Family History  Problem Relation Age of Onset  . Congestive Heart Failure Mother 63  . Cancer Brother   . Kidney failure Father      Review of Systems: Review of Systems  Constitutional: Positive for chills and malaise/fatigue. Negative for fever, weight loss and diaphoresis.  HENT: Negative for congestion.   Eyes: Negative for discharge and redness.  Respiratory: Positive for shortness of breath. Negative for cough, hemoptysis, sputum production and wheezing.   Cardiovascular: Positive for palpitations. Negative for chest pain, orthopnea, claudication, leg swelling and PND.  Gastrointestinal: Positive for nausea, vomiting and abdominal pain. Negative for heartburn, blood in stool and melena.    Musculoskeletal: Negative for falls.  Skin: Negative for rash.  Neurological: Positive for weakness. Negative for sensory change, speech change, focal weakness and loss of consciousness.  Endo/Heme/Allergies: Does not bruise/bleed easily.  Psychiatric/Behavioral: Negative for substance abuse. The patient is not nervous/anxious.     Labs: No results for input(s): CKTOTAL, CKMB, TROPONINI in the last 72 hours. Lab Results  Component Value Date   WBC 9.6 09/30/2015   HGB 11.9* 09/30/2015  HCT 38.7 09/30/2015   MCV 90.0 09/30/2015   PLT 154 09/30/2015     Recent Labs Lab 09/30/15 0510  NA 140  K 4.3  CL 111  CO2 21*  BUN 29*  CREATININE 1.33*  CALCIUM 9.1  PROT 6.5  BILITOT 1.0  ALKPHOS 134*  ALT 144*  AST 73*  GLUCOSE 64*   Lab Results  Component Value Date   CHOL 181 12/12/2014   HDL 43 12/12/2014   LDLCALC 94 12/12/2014   TRIG 218* 12/12/2014   No results found for: DDIMER  Radiology/Studies:  US Abdomen Limited Ruq  09/28/2015  CLINICAL DATA:  Right upper quadrant pain for 5 days EXAM: US ABDOMEN LIMITED - RIGHT UPPER QUADRANT COMPARISON:  None. FINDINGS: Gallbladder: There appears to be a wall echo shadow complex consistent with numerous gallstones. There is no Murphy's sign. The gallbladder wall does not appear to be thickened. Common bile duct: Diameter: 9 mm Liver: No focal lesion identified. Within normal limits in parenchymal echogenicity. IMPRESSION: Abundant cholelithiasis.  No Murphy sign. Common bile duct dilatation. Distal obstructing process including possible distal stone not excluded. Electronically Signed   By: Skipper Cliche M.D.   On: 09/28/2015 19:37    EKG: NSR, 62 bpm, left axis deviation, LBBB (old)  Telemetry: Not on telemetry for review  Weights: Filed Weights   09/28/15 1541 09/28/15 2140  Weight: 185 lb (83.915 kg) 185 lb (83.915 kg)     Physical Exam: Blood pressure 92/48, pulse 124, temperature 98.3 F (36.8 C), temperature  source Oral, resp. rate 18, height _0  (1.575 m), weight 185 lb (83.915 kg), SpO2 90 %. Body mass index is 33.83 kg/(m^2). General: Well developed, well nourished, in no acute distress. Head: Normocephalic, atraumatic, sclera non-icteric, no xanthomas, nares are without discharge.  Neck: Negative for carotid bruits. JVD not elevated. Lungs: Clear bilaterally to auscultation without wheezes, rales, or rhonchi. Breathing is unlabored. Heart: Tachycardic with S1 S2. No murmurs, rubs, or gallops appreciated. Abdomen: Soft, non-tender, non-distended with normoactive bowel sounds. No hepatomegaly. No rebound/guarding. No obvious abdominal masses. Msk:  Strength and tone appear normal for age. Extremities: No clubbing or cyanosis. No edema.  Distal pedal pulses are 2+ and equal bilaterally. Neuro: Alert and oriented X 3. No facial asymmetry. No focal deficit. Moves all extremities spontaneously. Psych:  Responds to questions appropriately with a normal affect.    Assessment and Plan:   1. Tachycardia: -Currently with heart rate of 126 bpm -Will place patient on telemetry to gather data for review -Further recommendations to follow -Continue metoprolol 50 mg bid -Could use either metoprolol 25 mg prn for breakthrough tachycardic rates vs if rates are persistent could increase metoprolol to 75 mg bid -Unclear at this time if her heart rates are 2/2 her pain vs her underlying atrial tachycardia  -Check TSH and Mg++ -K+ ok  2. Acute pancreatitis: -Improved -Still in pain -Per IM  3. Acute on CKD stage II: -Monitor -Per IM  4. PAD: -Outpatient follow up  5. Diastolic dysfunction: -She does not appear volume overloaded at this time -Metoprolol as above  6. HLD: -Simvastatin on hold given her elevated LFTs   Melvern Banker, PA-C Pager: [phone] 09/30/2015, 2:45 PM

## 2015-10-01 ENCOUNTER — Telehealth: Payer: Self-pay

## 2015-10-01 LAB — COMPREHENSIVE METABOLIC PANEL
ALBUMIN: 3.3 g/dL — AB (ref 3.5–5.0)
ALT: 104 U/L — ABNORMAL HIGH (ref 14–54)
AST: 45 U/L — ABNORMAL HIGH (ref 15–41)
Alkaline Phosphatase: 117 U/L (ref 38–126)
Anion gap: 11 (ref 5–15)
BILIRUBIN TOTAL: 0.9 mg/dL (ref 0.3–1.2)
BUN: 33 mg/dL — AB (ref 6–20)
CALCIUM: 8.6 mg/dL — AB (ref 8.9–10.3)
CHLORIDE: 106 mmol/L (ref 101–111)
CO2: 23 mmol/L (ref 22–32)
CREATININE: 1.45 mg/dL — AB (ref 0.44–1.00)
GFR, EST AFRICAN AMERICAN: 39 mL/min — AB (ref >60)
GFR, EST NON AFRICAN AMERICAN: 34 mL/min — AB (ref >60)
Glucose, Bld: 95 mg/dL (ref 65–99)
Potassium: 3.9 mmol/L (ref 3.5–5.1)
SODIUM: 140 mmol/L (ref 135–145)
Total Protein: 6.7 g/dL (ref 6.5–8.1)

## 2015-10-01 MED ORDER — PANTOPRAZOLE SODIUM 40 MG PO TBEC: 40.0000 mg | DELAYED_RELEASE_TABLET | Freq: Every day | ORAL | Status: DC

## 2015-10-01 MED ORDER — DILTIAZEM HCL 25 MG/5ML IV SOLN
5.0000 mg | Freq: Four times a day (QID) | INTRAVENOUS | Status: DC | PRN
  Administered 2015-10-02: 5 mg via INTRAVENOUS
  Filled 2015-10-01 (×2): qty 5

## 2015-10-01 MED ORDER — SODIUM CHLORIDE 0.9 % IV SOLN
INTRAVENOUS | Status: DC
  Administered 2015-10-01: 11:00:00 via INTRAVENOUS

## 2015-10-01 MED ORDER — DEXTROSE 5 % IV SOLN
1.0000 g | INTRAVENOUS | Status: DC
  Filled 2015-10-01: qty 10

## 2015-10-01 NOTE — Evaluation (Signed)
Physical Therapy Evaluation Patient Details Name: Carolyn Hendricks MRN: [id number] DOB: [birth year] Today's Date: 10/01/2015   History of Present Illness  Pt admitted for acute pancreatitis and needs chole sx as OP. Pt with complaints of nausea and vomiting x 3 days as well as abdominal pain.  Clinical Impression  Pt is a pleasant 79 year old female who was admitted for acute pancreatitis. Pt performs bed mobility, transfers, and ambulation with cga and rw. Pt limited in ambulation distance and desats with exertion to 87% while on room air. Pt with heart rate increasing to 138 bpm with exertion. Pt performed pursed lip breathing with cues. Pt demonstrates deficits with balance/strength/mobility/endurance. Pt needs 24/7 assist at this time in order to transfer home to maintain safety with limited exertion. Would benefit from skilled PT to address above deficits and promote optimal return to PLOF.      Follow Up Recommendations Home health PT;Supervision/Assistance - 24 hour    Equipment Recommendations       Recommendations for Other Services       Precautions / Restrictions Precautions Precautions: None Restrictions Weight Bearing Restrictions: No      Mobility  Bed Mobility Overal bed mobility: Needs Assistance Bed Mobility: Supine to Sit     Supine to sit: Min guard     General bed mobility comments: safe technique performed and pt able to scoot towards EOB with cues. Once returning to bed, pt able to scoot boddy up towards [facility] using rails  Transfers Overall transfer level: Needs assistance Equipment used: Rolling walker (2 wheeled) Transfers: Sit to/from Stand Sit to Stand: Min guard         General transfer comment: sit<>Stand with rw and cga. Safe technique performed. Once standing, no LOB noted.  Ambulation/Gait Ambulation/Gait assistance: Min guard Ambulation Distance (Feet): 80 Feet Assistive device: Rolling walker (2 wheeled) Gait Pattern/deviations:  Step-through pattern     General Gait Details: ambulated using rw and cga. Safe technique performed. Pt needs cues to stay close to rw and ambulates with [place] gait pattern. Pt fatigues and requests to return back to bed.  Stairs            Wheelchair Mobility    Modified Rankin (Stroke Patients Only)       Balance Overall balance assessment: Needs assistance Sitting-balance support: Feet supported;Bilateral upper extremity supported Sitting balance-Leahy Scale: Good     Standing balance support: Bilateral upper extremity supported Standing balance-Leahy Scale: Fair                               Pertinent Vitals/Pain Pain Assessment: No/denies pain    Home Living Family/patient expects to be discharged to:: Private residence Living Arrangements: Children Available Help at Discharge: Available PRN/intermittently Type of Home: House Home Access: Stairs to enter   [employer] of Steps: 1 Home Layout: One level Home Equipment: Environmental consultant - 2 wheels;Bedside commode;Cane - quad      Prior Function Level of Independence: Independent with assistive device(s)               Hand Dominance        Extremity/Trunk Assessment   Upper Extremity Assessment: Generalized weakness (grossly 4/5)           Lower Extremity Assessment:  (grossly 4/5)         Communication   Communication: No difficulties  Cognition Arousal/Alertness: Awake/alert Behavior During Therapy: WFL for tasks assessed/performed Overall  Cognitive Status:  (noted slightly confused)                      General Comments      Exercises        Assessment/Plan    PT Assessment Patient needs continued PT services  PT Diagnosis Difficulty walking;Generalized weakness;Abnormality of gait   PT Problem List Decreased strength;Decreased mobility  PT Treatment Interventions Gait training;Therapeutic exercise;DME instruction   PT Goals (Current goals  can be found in the Care Plan section) Acute Rehab PT Goals Patient Stated Goal: to go home PT Goal Formulation: With patient Time For Goal Achievement: 10/15/15 Potential to Achieve Goals: Good    Frequency Min 2X/week   Barriers to discharge Decreased caregiver support      Co-evaluation               End of Session Equipment Utilized During Treatment: Gait belt Activity Tolerance: Patient limited by fatigue Patient left: in bed;with bed alarm set Nurse Communication: Mobility status         Time: 1610-9604 PT Time Calculation (min) (ACUTE ONLY): 22 min   Charges:   PT Evaluation $Initial PT Evaluation Tier I: 1 Procedure     PT G Codes:        [name] 10/03/15, 5:10 PM  Elizabeth Palau, PT, DPT [phone]

## 2015-10-01 NOTE — Consult Note (Signed)
Subjective: Patient seen for biliary pancreatitis. No n/v and minimal abdominal pain.  Passing stool and flatus.    Objective: Vital signs in last 24 hours: Temp:  [98 F (36.7 C)-99 F (37.2 C)] 98.3 F (36.8 C) (12/22 0521) Pulse Rate:  [86-136] 86 (12/22 0937) Resp:  [17-18] 18 (12/22 0521) BP: (92-151)/(48-89) 124/78 mmHg (12/22 0937) SpO2:  [90 %-91 %] 91 % (12/22 0521) Blood pressure 124/78, pulse 86, temperature 98.3 F (36.8 C), temperature source Oral, resp. rate 18, height _0  (1.575 m), weight 83.915 kg (185 lb), SpO2 91 %.   Intake/Output from previous day: 12/21 0701 - 12/22 0700 In: 858.1 [P.O.:120; I.V.:738.1] Out: 950 [Urine:950]  Intake/Output this shift: Total I/O In: -  Out: 200 [Urine:200]   General appearance:  Elderly appearing 79 f no distress.  Resp:  cta Cardio:   rrr no rub or gallop GI:  Soft minimal discomfort in the epigastrum, no rebound or masses.  Extremities:     Lab Results: Results for orders placed or performed during the hospital encounter of 09/28/15 (from the past 24 hour(s))  Urine culture     Status: None (Preliminary result)   Collection Time: 09/30/15  1:36 PM  Result Value Ref Range   Specimen Description URINE, CLEAN CATCH    Special Requests NONE    Culture HOLDING FOR POSSIBLE PATHOGEN    Report Status PENDING   Comprehensive metabolic panel     Status: Abnormal   Collection Time: 10/01/15  5:01 AM  Result Value Ref Range   Sodium 140 135 - 145 mmol/L   Potassium 3.9 3.5 - 5.1 mmol/L   Chloride 106 101 - 111 mmol/L   CO2 23 22 - 32 mmol/L   Glucose, Bld 95 65 - 99 mg/dL   BUN 33 (H) 6 - 20 mg/dL   Creatinine, Ser 1.45 (H) 0.44 - 1.00 mg/dL   Calcium 8.6 (L) 8.9 - 10.3 mg/dL   Total Protein 6.7 6.5 - 8.1 g/dL   Albumin 3.3 (L) 3.5 - 5.0 g/dL   AST 45 (H) 15 - 41 U/L   ALT 104 (H) 14 - 54 U/L   Alkaline Phosphatase 117 38 - 126 U/L   Total Bilirubin 0.9 0.3 - 1.2 mg/dL   GFR calc non Af Amer 34 (L) >60 mL/min    GFR calc Af Amer 39 (L) >60 mL/min   Anion gap 11 5 - 15      Recent Labs  09/28/15 1544 09/29/15 0430 09/30/15 0510  WBC 7.5 8.1 9.6  HGB 12.6 11.3* 11.9*  HCT 39.8 36.1 38.7  PLT 193 167 154   BMET  Recent Labs  09/29/15 0430 09/30/15 0510 10/01/15 0501  NA 141 140 140  K 3.9 4.3 3.9  CL 112* 111 106  CO2 24 21* 23  GLUCOSE 80 64* 95  BUN 24* 29* 33*  CREATININE 1.24* 1.33* 1.45*  CALCIUM 9.0 9.1 8.6*   LFT  Recent Labs  10/01/15 0501  PROT 6.7  ALBUMIN 3.3*  AST 45*  ALT 104*  ALKPHOS 117  BILITOT 0.9   PT/INR No results for input(s): LABPROT, INR in the last 72 hours. Hepatitis Panel No results for input(s): HEPBSAG, HCVAB, HEPAIGM, HEPBIGM in the last 72 hours. C-Diff No results for input(s): CDIFFTOX in the last 72 hours. No results for input(s): CDIFFPCR in the last 72 hours.   Studies/Results: No results found.  Scheduled Inpatient Medications:   . cefTRIAXone (ROCEPHIN)  IV  1  g Intravenous Q24H  . fluticasone  1 spray Each Nare Daily  . heparin  5,000 Units Subcutaneous 3 times per day  . levothyroxine  75 mcg Oral QAC breakfast  . loratadine  10 mg Oral Daily  . LORazepam  0.5 mg Oral 3 times per day  . metoprolol tartrate  50 mg Oral BID  . pantoprazole  40 mg Oral Daily  . sertraline  50 mg Oral Daily    Continuous Inpatient Infusions:   . sodium chloride 75 mL/hr at 10/01/15 1125    PRN Inpatient Medications:  acetaminophen **OR** acetaminophen, albuterol, metoprolol tartrate, morphine injection, nitroGLYCERIN, ondansetron **OR** ondansetron (ZOFRAN) IV, oxyCODONE  Miscellaneous:   Assessment:  1 biliary pancreatitis. Resolved.  2 cholelithiasis  Plan:  1) elective ccy when clinically feasible,  Would consider mrcp to r/o residual debris in CBD, although the AP and bili are now normal .   Lollie Sails MD 10/01/2015, 12:17 PM

## 2015-10-01 NOTE — Progress Notes (Signed)
[place] [facility] [place] NAME: Carolyn Hendricks    MR#:  [id number]  DATE OF BIRTH:  [birth year]  SUBJECTIVE:  Patient presented with nausea vomiting abdominal pain for last 3 days.  Feels weak and sleepy, better epigastric abdominal pain. No nausea or vomiting.  REVIEW OF SYSTEMS:   Review of Systems  Constitutional: Negative for fever, chills and weight loss.  HENT: Negative for ear discharge, ear pain and nosebleeds.   Eyes: Negative for blurred vision, pain and discharge.  Respiratory: Negative for sputum production, shortness of breath, wheezing and stridor.   Cardiovascular: Negative for chest pain, palpitations, orthopnea and PND.  Gastrointestinal: Positive for abdominal pain. Negative for nausea, vomiting and diarrhea.  Genitourinary: Negative for urgency and frequency.  Musculoskeletal: Negative for back pain and joint pain.  Neurological: Positive for weakness. Negative for sensory change, speech change and focal weakness.  Psychiatric/Behavioral: Negative for depression and hallucinations. The patient is not nervous/anxious.   All other systems reviewed and are negative.  Tolerating Diet: Nothing by mouth Tolerating PT: Pending  DRUG ALLERGIES:   Allergies  Allergen Reactions  . Atorvastatin     Other reaction(s): Muscle Pain  . Crestor  [Rosuvastatin Calcium]     Other reaction(s): Muscle Pain  . Pravastatin Sodium     Muscle pain    VITALS:  Blood pressure 126/66, pulse 84, temperature 97.4 F (36.3 C), temperature source Oral, resp. rate 18, height _0  (1.575 m), weight 83.915 kg (185 lb), SpO2 88 %.  PHYSICAL EXAMINATION:   Physical Exam  GENERAL:  79 y.o.-year-old patient lying in the bed with no acute distress. Obese. EYES: Pupils equal, round, reactive to light and accommodation. No scleral icterus. Extraocular muscles intact.  HEENT: Head atraumatic, normocephalic. Oropharynx and nasopharynx clear.   NECK:  Supple, no jugular venous distention. No thyroid enlargement, no tenderness.  LUNGS: Normal breath sounds bilaterally, no wheezing, rales, rhonchi. No use of accessory muscles of respiration.  CARDIOVASCULAR: S1, S2 normal. No murmurs, rubs, or gallops.  ABDOMEN: Soft, mild diffuse tenderness over the upper abdomen - nondistended. Bowel sounds present. No organomegaly or mass.  EXTREMITIES: No cyanosis, clubbing or edema b/l.    NEUROLOGIC: Cranial nerves II through XII are intact. No focal Motor or sensory deficits b/l.   PSYCHIATRIC:  patient is alert and oriented x 3.  SKIN: No obvious rash, lesion, or ulcer.    LABORATORY PANEL:   CBC  Recent Labs Lab 09/30/15 0510  WBC 9.6  HGB 11.9*  HCT 38.7  PLT 154    Chemistries   Recent Labs Lab 09/30/15 0510 10/01/15 0501  NA 140 140  K 4.3 3.9  CL 111 106  CO2 21* 23  GLUCOSE 64* 95  BUN 29* 33*  CREATININE 1.33* 1.45*  CALCIUM 9.1 8.6*  MG 1.8  --   AST 73* 45*  ALT 144* 104*  ALKPHOS 134* 117  BILITOT 1.0 0.9    Cardiac Enzymes No results for input(s): TROPONINI in the last 168 hours.  RADIOLOGY:  No results found.  ASSESSMENT AND PLAN:  Carolyn Hendricks is a 79 y.o. female with a known history of CHF, CAD and hypertension. The patient to present to the ED with the abdominal pain with nausea and vomiting for 3 days. Abdominal pain is diffuse, constant, 10 out of 10 with radiation to back. Patient also has associated nausea and vomiting. She also complains of dark urine  1. Acute pancreatitis suspected  due to cholelithiasis and possible choledocholithiasis advanced diet, IV fluid support, pain control, Zofran when necessary.  lipase level down to normal.  2. Cholelithiasis with possible common bile duct obstruction -Patient's LFTs show improvement her bilirubin is down from 4.4-1.0. There is a possibility she may have passed a stone. continue Rocephin. Per Surgical consultation, Dr. Paulino Door  intervention needs to be delayed for a minimum of 5 days off of Plavix.  Per GI consultation, Dr. Gustavo Lah, no need for ERCP. Would consider mrcp to r/o residual debris in CBD, although the AP and bili are now normal .  3. Elevated bilirubin and Abnormal liver function test. Improving.  4. ARF due to Dehydration. Worsening, Continue IV fluid support and follow-up BMP.  5. Hypertension. hold hypertension medication except for Lopressor due to low side blood pressure..  6. History of chronic CHF, unknown type, stable. Patient is not in heart failure.  * Tachycardia. Possible due to pain and dehydration, improved. Continue metoprolol 50 mg bid per Cardiology consult.  Discussed withDr. Gustavo Lah and Dr. Marina Gravel. Case discussed with Care Management/Social Worker. Management plans discussed with the patient, family and they are in agreement.  CODE STATUS: Full  DVT Prophylaxis: Lovenox  PT for weakness.  TOTAL TIME TAKING CARE OF THIS PATIENT: 42 minutes.  >50% time spent on counselling and coordination of care  POSSIBLE D/C IN one to 2 DAYS, DEPENDING ON CLINICAL CONDITION.   Demetrios Loll M.D on 10/01/2015 at 4:28 PM  Between 7am to 6pm - Pager - [phone]  After 6pm go to [email or web address] - password [email or web address] [facility]  Office  [phone]  CC: Primary care physician; [name], MD

## 2015-10-01 NOTE — Progress Notes (Signed)
Patient: Carolyn Hendricks / Admit Date: 09/28/2015 / Date of Encounter: 10/01/2015, 11:52 AM   Subjective: Episodes of atrial tachycardia overnight last occuring at 3 AM with heart rates into the 130's. She did not receive an extra dose of metoprolol as ordered. She is currently in sinus rhythm with heart rate in the 70's.   Review of Systems: Review of Systems  Constitutional: Positive for malaise/fatigue. Negative for fever, chills, weight loss and diaphoresis.  HENT: Negative for congestion.   Eyes: Negative for discharge and redness.  Respiratory: Negative for cough, sputum production, shortness of breath and wheezing.   Cardiovascular: Negative for chest pain, palpitations, orthopnea, claudication, leg swelling and PND.  Gastrointestinal: Positive for nausea and abdominal pain. Negative for heartburn, vomiting, diarrhea, constipation, blood in stool and melena.  Musculoskeletal: Negative for falls.  Skin: Negative for rash.  Neurological: Positive for weakness. Negative for sensory change, speech change, focal weakness and loss of consciousness.  Endo/Heme/Allergies: Does not bruise/bleed easily.  Psychiatric/Behavioral: The patient is not nervous/anxious.     Objective: Telemetry: NSR, 70's currently. Episodes of sinus rhythm with PACs . Atrial tach overnight from 10-11 PM into the 120's to 130s last occuring at 3 AM. Physical Exam: Blood pressure 124/78, pulse 86, temperature 98.3 F (36.8 C), temperature source Oral, resp. rate 18, height _0  (1.575 m), weight 185 lb (83.915 kg), SpO2 91 %. Body mass index is 33.83 kg/(m^2). General: Well developed, well nourished, in no acute distress. Head: Normocephalic, atraumatic, sclera non-icteric, no xanthomas, nares are without discharge. Neck: Negative for carotid bruits. JVP not elevated. Lungs: Clear bilaterally to auscultation without wheezes, rales, or rhonchi. Breathing is unlabored. Heart: RRR S1 S2 without murmurs, rubs,  or gallops.  Abdomen: Soft, non-tender, non-distended with normoactive bowel sounds. No rebound/guarding. Extremities: No clubbing or cyanosis. No edema. Distal pedal pulses are 2+ and equal bilaterally. Neuro: Alert and oriented X 3. Moves all extremities spontaneously. Psych:  Responds to questions appropriately with a normal affect.   Intake/Output Summary (Last 24 hours) at 10/01/15 1152 Last data filed at 10/01/15 0915  Gross per 24 hour  Intake 858.13 ml  Output    950 ml  Net -91.87 ml    Inpatient Medications:  . cefTRIAXone (ROCEPHIN)  IV  1 g Intravenous Q24H  . fluticasone  1 spray Each Nare Daily  . heparin  5,000 Units Subcutaneous 3 times per day  . levothyroxine  75 mcg Oral QAC breakfast  . loratadine  10 mg Oral Daily  . LORazepam  0.5 mg Oral 3 times per day  . metoprolol tartrate  50 mg Oral BID  . pantoprazole  40 mg Oral Daily  . sertraline  50 mg Oral Daily   Infusions:  . sodium chloride 75 mL/hr at 10/01/15 1125    Labs:  Recent Labs  09/28/15 2200  09/30/15 0510 10/01/15 0501  NA  --   < > 140 140  K  --   < > 4.3 3.9  CL  --   < > 111 106  CO2  --   < > 21* 23  GLUCOSE  --   < > 64* 95  BUN  --   < > 29* 33*  CREATININE  --   < > 1.33* 1.45*  CALCIUM  --   < > 9.1 8.6*  MG 1.9  --  1.8  --   < > = values in this interval not displayed.  Recent Labs  09/30/15 0510 10/01/15 0501  AST 73* 45*  ALT 144* 104*  ALKPHOS 134* 117  BILITOT 1.0 0.9  PROT 6.5 6.7  ALBUMIN 3.6 3.3*    Recent Labs  09/28/15 1544 09/29/15 0430 09/30/15 0510  WBC 7.5 8.1 9.6  NEUTROABS 6.5  --   --   HGB 12.6 11.3* 11.9*  HCT 39.8 36.1 38.7  MCV 87.4 88.7 90.0  PLT 193 167 154   No results for input(s): CKTOTAL, CKMB, TROPONINI in the last 72 hours. Invalid input(s): POCBNP No results for input(s): HGBA1C in the last 72 hours.   Weights: Filed Weights   09/28/15 1541 09/28/15 2140  Weight: 185 lb (83.915 kg) 185 lb (83.915 kg)      Radiology/Studies:  US Abdomen Limited Ruq  09/28/2015  CLINICAL DATA:  Right upper quadrant pain for 5 days EXAM: US ABDOMEN LIMITED - RIGHT UPPER QUADRANT COMPARISON:  None. FINDINGS: Gallbladder: There appears to be a wall echo shadow complex consistent with numerous gallstones. There is no Murphy's sign. The gallbladder wall does not appear to be thickened. Common bile duct: Diameter: 9 mm Liver: No focal lesion identified. Within normal limits in parenchymal echogenicity. IMPRESSION: Abundant cholelithiasis.  No Murphy sign. Common bile duct dilatation. Distal obstructing process including possible distal stone not excluded. Electronically Signed   By: Skipper Cliche M.D.   On: 09/28/2015 19:37     Assessment and Plan   1. Paroxysmal atrial tachycardia: -Several episodes into the 120s-130s overnight, last occuring at 3AM -Her acute illness/pain made predispose her to having increased ectopy burden -Currently in sinus rhythm with heart rate in the 70's -Continue metoprolol 50 mg bid with an extra 25 mg prn tachycardia, though may need to increase to standing metoprolol to 75 mg bid if telemetry demonstrates significant increase in ectopy burden  -Monitor while inpatient  -Asymptomatic  -Replete Mg++ to 2.0  2. Acute pancreatitis: -Improved -Per IM  3. Acute on CKD stage II: -Monitor  -Per IM  4. PAD: -Outpatient follow up  5. Diastolic dysfunction: -She does not appear volume overloaded at this time -Metoprolol as above  6. HLD: -Simvastatin on hold given her elevated LFTs   Melvern Banker, PA-C Pager: [phone] 10/01/2015, 11:52 AM

## 2015-10-01 NOTE — Progress Notes (Signed)
Notified Dr Bridgett Larsson re pt and pt daughter concerns over d/c and desire to stay additional night. MD acknowledged, stated to cancel d/c and that pt may stay

## 2015-10-01 NOTE — Telephone Encounter (Signed)
Patient is scheduled for a hospital follow up on 10/19/2015 at 11:00am. D/C on 10/01/2015- abdominal pain, diarrhea, vomitting

## 2015-10-01 NOTE — Progress Notes (Signed)
Notified Dr Margaretmary Eddy of pt HR elevated 130's-140's; Dr ordered to transfer pt to 2A, and ordered cardizem 54m IV PRN

## 2015-10-01 NOTE — Progress Notes (Signed)
[facility]   PATIENT NAME: Carolyn Hendricks    MR#:  [id number]  DATE OF BIRTH:  [birth year]  SUBJECTIVE:  She still has some mild nausea but tolerated liquids yesterday. Less abdominal pain. She was seen by cardiology for tachycardia. Her Plavix and aspirin were stopped yesterday.  REVIEW OF SYSTEMS:   Review of Systems  Constitutional: Negative for fever, chills and weight loss.  HENT: Negative for ear discharge, ear pain and nosebleeds.   Eyes: Negative for blurred vision, pain and discharge.  Respiratory: Negative for sputum production, shortness of breath, wheezing and stridor.   Cardiovascular: Negative for chest pain, palpitations, orthopnea and PND.  Gastrointestinal: Positive for abdominal pain. Negative for nausea, vomiting and diarrhea.  Genitourinary: Negative for urgency and frequency.  Musculoskeletal: Negative for back pain and joint pain.  Neurological: Positive for weakness. Negative for sensory change, speech change and focal weakness.  Psychiatric/Behavioral: Negative for depression and hallucinations. The patient is not nervous/anxious.   All other systems reviewed and are negative.   DRUG ALLERGIES:   Allergies  Allergen Reactions  . Atorvastatin     Other reaction(s): Muscle Pain  . Crestor  [Rosuvastatin Calcium]     Other reaction(s): Muscle Pain  . Pravastatin Sodium     Muscle pain    VITALS:  Blood pressure 151/89, pulse 93, temperature 98.3 F (36.8 C), temperature source Oral, resp. rate 18, height 5' 2" (1.575 m), weight 185 lb (83.915 kg), SpO2 91 %.  I/O last 3 completed shifts: In: 1451.1 [P.O.:120; I.V.:1331.1] Out: 950 [Urine:950]   Filed Vitals:   09/30/15 1406 09/30/15 2123 10/01/15 0011 10/01/15 0521  BP: 92/48 119/70  151/89  Pulse: 124 136  93  Temp: 98.3 F (36.8 C) 99 F (37.2 C) 98 F (36.7 C) 98.3 F (36.8 C)  TempSrc: Oral Oral Oral Oral  Resp: _0 Height:      Weight:      SpO2: 90% 91%  91%     PHYSICAL EXAMINATION:  Physical Exam  Constitutional: She is oriented to person, place, and time and well-developed, well-nourished, and in no distress. No distress.  HENT:  Head: Atraumatic.  Eyes: Pupils are equal, round, and reactive to light. Right eye exhibits no discharge.  Abdominal: Soft. She exhibits no distension. There is no tenderness.  Neurological: She is oriented to person, place, and time.  Skin: Skin is warm and dry. She is not diaphoretic.      ASSESSMENT AND PLAN:   Resolving gallstone related pancreatitis. Sinus tachycardia At present surgical intervention needs to be delayed for a minimum of 5 days off of Plavix. I discussed this Dr Bridgett Larsson. I will follow with you.

## 2015-10-01 NOTE — Care Management (Signed)
Spoke with patient and daughter Hilda Blades) at the bedside.Daughter expressed concerns about the patient being weaker now and questioned discharge. Discussed patient condition. Patient had been on Plavix and would need to be off med for several days prior to surgical intervention. Nurse Legrand Como is contacting MD to address daughters concerns. Would like for Nursing to ambulate patient to see how she does prior to discharge. Could have [facility] nurse come to home to check on patient after discharge Daughter stated there will be family support at home but times when she will be alone. Patient has walker and 4 point cane. No O2.  Daughter stated that she could arrange sitter if need be when offered list of area providers More to follow.

## 2015-10-02 LAB — URINE CULTURE

## 2015-10-02 LAB — BASIC METABOLIC PANEL
ANION GAP: 7 (ref 5–15)
BUN: 35 mg/dL — ABNORMAL HIGH (ref 6–20)
CO2: 26 mmol/L (ref 22–32)
Calcium: 9.1 mg/dL (ref 8.9–10.3)
Chloride: 108 mmol/L (ref 101–111)
Creatinine, Ser: 1.17 mg/dL — ABNORMAL HIGH (ref 0.44–1.00)
GFR calc Af Amer: 51 mL/min — ABNORMAL LOW (ref >60)
GFR calc non Af Amer: 44 mL/min — ABNORMAL LOW (ref >60)
GLUCOSE: 142 mg/dL — AB (ref 65–99)
POTASSIUM: 4.1 mmol/L (ref 3.5–5.1)
Sodium: 141 mmol/L (ref 135–145)

## 2015-10-02 MED ORDER — METOPROLOL TARTRATE 50 MG PO TABS: 75.0000 mg | ORAL_TABLET | Freq: Two times a day (BID) | ORAL | Status: DC

## 2015-10-02 MED ORDER — PANTOPRAZOLE SODIUM 40 MG PO TBEC: 40.0000 mg | DELAYED_RELEASE_TABLET | Freq: Every day | ORAL | Status: DC

## 2015-10-02 NOTE — Progress Notes (Signed)
Pt is alert and oriented x 4, daughter at bedside, on room air, up to bsc with stand by assist, fair appetite, denies n/v, c/o abdominal pain improved with prn narcotics, pt is d/c to home with home health, appt scheduled with surgery, GI, and PCP. Rx for Protonix and metoprolol provided to patient. Pt instructed on d/c instructions and reports understanding with no questions at this time. Pt educated on low fat diet. Uneventful shift.

## 2015-10-02 NOTE — Care Management (Signed)
Plan for discharge to home today with home health nursing, PT and SW.  Patient and daughter state that she has used [facility] in the past and would like to use them again.  Jason from [facility] notified of referral.  PT has recommended home health PT with 24 hours supervision.  Patient's daughter states that she helps care for her mother during the day, however she is alone at night.  PCS list and sitter list provided to patient and daughter.  RNCM signing off

## 2015-10-02 NOTE — Care Management Important Message (Signed)
Important Message  Patient Details  Name: Carolyn Hendricks MRN: [id number] Date of Birth: 05/12/1936   Medicare Important Message Given:  Yes    [name] 10/02/2015, 9:21 AM

## 2015-10-02 NOTE — Progress Notes (Signed)
Patient: NYRAH DEMOS / Admit Date: 09/28/2015 / Date of Encounter: 10/02/2015, 1:38 PM   Subjective: She is for discharge today. No further tachycardic episodes.   Review of Systems: Review of Systems  Constitutional: Positive for malaise/fatigue. Negative for fever, chills, weight loss and diaphoresis.  HENT: Negative for congestion.   Eyes: Negative for discharge and redness.  Respiratory: Negative for cough, sputum production, shortness of breath and wheezing.   Cardiovascular: Negative for chest pain, palpitations, orthopnea, claudication, leg swelling and PND.  Gastrointestinal: Positive for abdominal pain. Negative for nausea and vomiting.  Skin: Negative for rash.  Neurological: Positive for weakness. Negative for sensory change, speech change, focal weakness and loss of consciousness.  Endo/Heme/Allergies: Does not bruise/bleed easily.  Psychiatric/Behavioral: Negative for hallucinations and substance abuse. The patient is not nervous/anxious.      Objective: Telemetry: NSR, 80's, no further tachycardic episodes Physical Exam: Blood pressure 152/83, pulse 76, temperature 97.6 F (36.4 C), temperature source Oral, resp. rate 18, height 5' 2" (1.575 m), weight 183 lb 14.4 oz (83.416 kg), SpO2 90 %. Body mass index is 33.63 kg/(m^2). General: Well developed, well nourished, in no acute distress. Head: Normocephalic, atraumatic, sclera non-icteric, no xanthomas, nares are without discharge. Neck: Negative for carotid bruits. JVP not elevated. Lungs: Clear bilaterally to auscultation without wheezes, rales, or rhonchi. Breathing is unlabored. Heart: RRR S1 S2 without murmurs, rubs, or gallops.  Abdomen: Soft, non-tender, non-distended with normoactive bowel sounds. No rebound/guarding. Extremities: No clubbing or cyanosis. No edema. Distal pedal pulses are 2+ and equal bilaterally. Neuro: Alert and oriented X 3. Moves all extremities spontaneously. Psych:  Responds to  questions appropriately with a normal affect.   Intake/Output Summary (Last 24 hours) at 10/02/15 1338 Last data filed at 10/02/15 1300  Gross per 24 hour  Intake    341 ml  Output    775 ml  Net   -434 ml    Inpatient Medications:  . cefTRIAXone (ROCEPHIN)  IV  1 g Intravenous Q24H  . fluticasone  1 spray Each Nare Daily  . heparin  5,000 Units Subcutaneous 3 times per day  . levothyroxine  75 mcg Oral QAC breakfast  . loratadine  10 mg Oral Daily  . LORazepam  0.5 mg Oral 3 times per day  . metoprolol tartrate  50 mg Oral BID  . pantoprazole  40 mg Oral Daily  . sertraline  50 mg Oral Daily   Infusions:    Labs:  Recent Labs  09/30/15 0510 10/01/15 0501 10/02/15 0506  NA 140 140 141  K 4.3 3.9 4.1  CL 111 106 108  CO2 21* 23 26  GLUCOSE 64* 95 142*  BUN 29* 33* 35*  CREATININE 1.33* 1.45* 1.17*  CALCIUM 9.1 8.6* 9.1  MG 1.8  --   --     Recent Labs  09/30/15 0510 10/01/15 0501  AST 73* 45*  ALT 144* 104*  ALKPHOS 134* 117  BILITOT 1.0 0.9  PROT 6.5 6.7  ALBUMIN 3.6 3.3*    Recent Labs  09/30/15 0510  WBC 9.6  HGB 11.9*  HCT 38.7  MCV 90.0  PLT 154   No results for input(s): CKTOTAL, CKMB, TROPONINI in the last 72 hours. Invalid input(s): POCBNP No results for input(s): HGBA1C in the last 72 hours.   Weights: Filed Weights   09/28/15 1541 09/28/15 2140 10/01/15 2247  Weight: 185 lb (83.915 kg) 185 lb (83.915 kg) 183 lb 14.4 oz (83.416 kg)  Radiology/Studies:  US Abdomen Limited Ruq  09/28/2015  CLINICAL DATA:  Right upper quadrant pain for 5 days EXAM: US ABDOMEN LIMITED - RIGHT UPPER QUADRANT COMPARISON:  None. FINDINGS: Gallbladder: There appears to be a wall echo shadow complex consistent with numerous gallstones. There is no Murphy's sign. The gallbladder wall does not appear to be thickened. Common bile duct: Diameter: 9 mm Liver: No focal lesion identified. Within normal limits in parenchymal echogenicity. IMPRESSION: Abundant  cholelithiasis.  No Murphy sign. Common bile duct dilatation. Distal obstructing process including possible distal stone not excluded. Electronically Signed   By: Skipper Cliche M.D.   On: 09/28/2015 19:37     Assessment and Plan   1. Paroxysmal atrial tachycardia: -No further tachycardic episodes -Her acute illness/pain made predispose her to having increased ectopy burden -Currently in sinus rhythm with heart rate in the 80's -Continue metoprolol 50 mg bid with an extra 25 mg prn tachycardia -Monitor while inpatient  -Asymptomatic  -Replete Mg++ to 2.0  2. Acute pancreatitis: -Improved -Per IM  3. Acute on CKD stage II: -Monitor  -Per IM  4. PAD: -Outpatient follow up  5. Diastolic dysfunction: -She does not appear volume overloaded at this time -Metoprolol as above  6. HLD: -Simvastatin on hold given her elevated LFTs   Melvern Banker, PA-C Pager: [phone] 10/02/2015, 1:38 PM

## 2015-10-02 NOTE — Progress Notes (Signed)
Pt transferred from [facility] due to heart rate. Upon arrival heart rate maintaining between 70s-90. Patient heart reate spiked into 130s and sustained. PRN medication given. Heart rate decreased back down to normal range. Pt asymptomatic. Will continue to monitor

## 2015-10-02 NOTE — Discharge Summary (Signed)
[facility] NAME: Carolyn Hendricks    MR#:  [id number]  DATE OF BIRTH:  11/13/1935  DATE OF ADMISSION:  09/28/2015 ADMITTING PHYSICIAN: Demetrios Loll, MD  DATE OF DISCHARGE: 10/02/2015  2:32 PM  PRIMARY CARE PHYSICIAN: [name], MD    ADMISSION DIAGNOSIS:  Pain [R52] RUQ pain [R10.11] Acute gallstone pancreatitis [K85.10]   DISCHARGE DIAGNOSIS:    SECONDARY DIAGNOSIS:   Past Medical History  Diagnosis Date  . Heart murmur   . Head ache   . Chest pain   . Shortness of breath   . Dizziness   . High blood pressure   . Arthritis   . History of MRSA infection   . Chronic kidney disease   . Diastolic dysfunction     HOSPITAL COURSE:   Carolyn Hendricks is a 79 y.o. female with a known history of CHF, CAD and hypertension. The patient to present to the ED with the abdominal pain with nausea and vomiting for 3 days. Abdominal pain is diffuse, constant, 10 out of 10 with radiation to back. Patient also has associated nausea and vomiting. She also complains of dark urine  1. Acute pancreatitis suspected due to cholelithiasis and possible choledocholithiasis The patient was kept on nothing by mouth status with IV fluid support and the pain control, then advanced diet after lipase level down to normal.   2. Cholelithiasis with possible common bile duct obstruction -Patient's LFTs show improvement her bilirubin is down from 4.4-1.0. There is a possibility she may have passed a stone. She was treated with Rocephin. Per Surgical consultation, Dr. Paulino Door intervention needs to be delayed for minimum of 5 days off of Plavix. The patient need follow-up him as outpatient.  Per GI consultation, Dr. Gustavo Lah, no need for ERCP. Would consider mrcp to r/o residual debris in CBD, although the AP and bili are now normal. Follow-up as outpatient.  3. Elevated bilirubin and Abnormal liver function test. Improving.  4. ARF due to  Dehydration. Improved with IV fluid support.  5. Hypertension. hold hypertension medication except for Lopressor due to low side blood pressure.resume home hypertension medication except Lasix.  6. History of chronic CHF, unknown type, stable. Patient is not in heart failure. may resume Lasix in the future depending on PCP.   * Tachycardia. Possible due to pain and dehydration, improved.  treated with Lopressor and increased to 75 mg bid per Cardiology consult.  DISCHARGE CONDITIONS:   Stable, discharged to home with home health and PT today.  CONSULTS OBTAINED:  Treatment Team:  Lollie Sails, MD Wellington Hampshire, MD  DRUG ALLERGIES:   Allergies  Allergen Reactions  . Atorvastatin     Other reaction(s): Muscle Pain  . Crestor  [Rosuvastatin Calcium]     Other reaction(s): Muscle Pain  . Pravastatin Sodium     Muscle pain    DISCHARGE MEDICATIONS:   Discharge Medication List as of 10/02/2015 12:26 PM    START taking these medications   Details  pantoprazole (PROTONIX) 40 MG tablet Take 1 tablet (40 mg total) by mouth daily., Starting 10/01/2015, Until Discontinued, Print      CONTINUE these medications which have CHANGED   Details  metoprolol (LOPRESSOR) 50 MG tablet Take 1.5 tablets (75 mg total) by mouth 2 (two) times daily., Starting 10/02/2015, Until Discontinued, Print      CONTINUE these medications which have NOT CHANGED   Details  aspirin 81 MG tablet Take 81  mg by mouth daily., Until Discontinued, Historical Med    cetirizine (ZYRTEC) 10 MG tablet Take 10 mg by mouth daily., Until Discontinued, Historical Med    clopidogrel (PLAVIX) 75 MG tablet Take 75 mg by mouth daily., Until Discontinued, Historical Med    Cyanocobalamin (VITAMIN B-12 PO) Take by mouth daily., Until Discontinued, Historical Med    fluticasone (FLONASE) 50 MCG/ACT nasal spray INHALE ONE TO TWO SPRAY IN EACH NOSTRIL DAILY AS NEEDED, Normal    Fluticasone-Salmeterol (ADVAIR  DISKUS) 250-50 MCG/DOSE AEPB Inhale 1 puff into the lungs every 12 (twelve) hours., Starting 06/29/2015, Until Discontinued, Normal    HYDROcodone-acetaminophen (NORCO) 10-325 MG tablet Take 1 tablet by mouth every 4 (four) hours as needed., Starting 09/07/2015, Until Discontinued, Print    isosorbide mononitrate (IMDUR) 30 MG 24 hr tablet Take 1 tablet (30 mg total) by mouth daily., Starting 05/14/2015, Until Discontinued, Normal    levocetirizine (XYZAL) 5 MG tablet Take 5 mg by mouth every evening., Until Discontinued, Historical Med    levothyroxine (SYNTHROID, LEVOTHROID) 75 MCG tablet Take 1 tablet (75 mcg total) by mouth daily., Starting 04/17/2015, Until Discontinued, Normal    LORazepam (ATIVAN) 0.5 MG tablet Take 0.5 mg by mouth every 8 (eight) hours., Until Discontinued, Historical Med    Multiple Vitamin (MULTI-VITAMIN DAILY PO) Take by mouth., Until Discontinued, Historical Med    mupirocin ointment (BACTROBAN) 2 % Apply 1 application topically 3 (three) times daily. , Starting 12/10/2013, Until Discontinued, Historical Med    nitroGLYCERIN (NITROSTAT) 0.4 MG SL tablet Place 1 tablet (0.4 mg total) under the tongue every 5 (five) minutes as needed for chest pain., Starting 05/14/2015, Until Discontinued, Normal    sertraline (ZOLOFT) 50 MG tablet 0.5 to 1 tablet daily, Normal    simvastatin (ZOCOR) 20 MG tablet Take 1 tablet (20 mg total) by mouth daily at 6 PM., Starting 07/06/2015, Until Discontinued, Normal    traZODone (DESYREL) 100 MG tablet 1/2-1 tablet at bedtime as needed, Normal    valsartan-hydrochlorothiazide (DIOVAN-HCT) 320-12.5 MG per tablet Take 1 tablet by mouth daily., Until Discontinued, Historical Med      STOP taking these medications     furosemide (LASIX) 20 MG tablet      meloxicam (MOBIC) 15 MG tablet          DISCHARGE INSTRUCTIONS:    If you experience worsening of your admission symptoms, develop shortness of breath, life threatening emergency,  suicidal or homicidal thoughts you must seek medical attention immediately by calling 911 or calling your MD immediately  if symptoms less severe.  You Must read complete instructions/literature along with all the possible adverse reactions/side effects for all the Medicines you take and that have been prescribed to you. Take any new Medicines after you have completely understood and accept all the possible adverse reactions/side effects.   Please note  You were cared for by a hospitalist during your hospital stay. If you have any questions about your discharge medications or the care you received while you were in the hospital after you are discharged, you can call the unit and asked to speak with the hospitalist on call if the hospitalist that took care of you is not available. Once you are discharged, your primary care physician will handle any further medical issues. Please note that NO REFILLS for any discharge medications will be authorized once you are discharged, as it is imperative that you return to your primary care physician (or establish a relationship with a primary care  physician if you do not have one) for your aftercare needs so that they can reassess your need for medications and monitor your lab values.    Today   SUBJECTIVE    mild abdominal pain and weakness.    VITAL SIGNS:  Blood pressure 152/83, pulse 76, temperature 97.6 F (36.4 C), temperature source Oral, resp. rate 18, height _0  (1.575 m), weight 83.416 kg (183 lb 14.4 oz), SpO2 90 %.  I/O:   Intake/Output Summary (Last 24 hours) at 10/02/15 1706 Last data filed at 10/02/15 1300  Gross per 24 hour  Intake      0 ml  Output    575 ml  Net   -575 ml    PHYSICAL EXAMINATION:  GENERAL:  79 y.o.-year-old patient lying in the bed with no acute distress.  EYES: Pupils equal, round, reactive to light and accommodation. No scleral icterus. Extraocular muscles intact.  HEENT: Head atraumatic, normocephalic.  Oropharynx and nasopharynx clear.  NECK:  Supple, no jugular venous distention. No thyroid enlargement, no tenderness.  LUNGS: Normal breath sounds bilaterally, no wheezing, rales,rhonchi or crepitation. No use of accessory muscles of respiration.  CARDIOVASCULAR: S1, S2 normal. No murmurs, rubs, or gallops.  ABDOMEN: Soft, Mild epigastric tenderness, non-distended. Bowel sounds present. No organomegaly or mass.  EXTREMITIES: No pedal edema, cyanosis, or clubbing.  NEUROLOGIC: Cranial nerves II through XII are intact. Muscle strength 5/5 in all extremities. Sensation intact. Gait not checked.  PSYCHIATRIC: The patient is alert and oriented x 3.  SKIN: No obvious rash, lesion, or ulcer.   DATA REVIEW:   CBC  Recent Labs Lab 09/30/15 0510  WBC 9.6  HGB 11.9*  HCT 38.7  PLT 154    Chemistries   Recent Labs Lab 09/30/15 0510 10/01/15 0501 10/02/15 0506  NA 140 140 141  K 4.3 3.9 4.1  CL 111 106 108  CO2 21* 23 26  GLUCOSE 64* 95 142*  BUN 29* 33* 35*  CREATININE 1.33* 1.45* 1.17*  CALCIUM 9.1 8.6* 9.1  MG 1.8  --   --   AST 73* 45*  --   ALT 144* 104*  --   ALKPHOS 134* 117  --   BILITOT 1.0 0.9  --     Cardiac Enzymes No results for input(s): TROPONINI in the last 168 hours.  Microbiology Results  Results for orders placed or performed during the hospital encounter of 09/28/15  Urine culture     Status: None   Collection Time: 09/30/15  1:36 PM  Result Value Ref Range Status   Specimen Description URINE, CLEAN CATCH  Final   Special Requests NONE  Final   Culture MULTIPLE SPECIES PRESENT, SUGGEST RECOLLECTION  Final   Report Status 10/02/2015 FINAL  Final    RADIOLOGY:  No results found.     discussed with the cardiology PA.   Management plans discussed with the patient, family and they are in agreement.  CODE STATUS:     Code Status Orders        Start     Ordered   09/28/15 2144  Full code   Continuous     09/28/15 2143      TOTAL TIME  TAKING CARE OF THIS PATIENT: 36 minutes.    Demetrios Loll M.D on 10/02/2015 at 5:06 PM  Between 7am to 6pm - Pager - [phone]  After 6pm go to [email or web address] - password [email or web address] [facility]  [facility]  Office  [phone]  CC: Primary care  physician; [name], MD

## 2015-10-02 NOTE — Discharge Instructions (Signed)
Low fat and heart healthy diet. Activity as tolerated. Follow up Dr. Marina Gravel for gallbladder surgery. Need to be off ASA and plavix at least for 5 days before surgery. HHPT

## 2015-10-06 ENCOUNTER — Other Ambulatory Visit: Payer: Self-pay | Admitting: Family Medicine

## 2015-10-06 DIAGNOSIS — M2550 Pain in unspecified joint: Secondary | ICD-10-CM

## 2015-10-06 NOTE — Telephone Encounter (Signed)
HYDROcodone-acetaminophen (NORCO) 10-325 MG tablet prn 09/07/15 -- Birdie Sons, MD Take 1 tablet by mouth every 4 (four) hours as needed. Pt needs refill  Thanks, Con Memos

## 2015-10-07 MED ORDER — HYDROCODONE-ACETAMINOPHEN 10-325 MG PO TABS: 1.0000 | ORAL_TABLET | ORAL | Status: DC | PRN

## 2015-10-13 ENCOUNTER — Other Ambulatory Visit: Payer: Self-pay

## 2015-10-15 DIAGNOSIS — J45909 Unspecified asthma, uncomplicated: Secondary | ICD-10-CM | POA: Insufficient documentation

## 2015-10-15 DIAGNOSIS — K851 Biliary acute pancreatitis without necrosis or infection: Secondary | ICD-10-CM | POA: Diagnosis not present

## 2015-10-15 DIAGNOSIS — R262 Difficulty in walking, not elsewhere classified: Secondary | ICD-10-CM | POA: Insufficient documentation

## 2015-10-15 DIAGNOSIS — I739 Peripheral vascular disease, unspecified: Secondary | ICD-10-CM | POA: Insufficient documentation

## 2015-10-15 DIAGNOSIS — R1013 Epigastric pain: Secondary | ICD-10-CM | POA: Diagnosis not present

## 2015-10-16 ENCOUNTER — Ambulatory Visit: Payer: Medicare Other | Admitting: Surgery

## 2015-10-16 ENCOUNTER — Ambulatory Visit (INDEPENDENT_AMBULATORY_CARE_PROVIDER_SITE_OTHER): Payer: Medicare Other | Admitting: Surgery

## 2015-10-16 ENCOUNTER — Encounter: Payer: Self-pay | Admitting: Surgery

## 2015-10-16 VITALS — BP 160/80 | HR 55 | Temp 97.9°F | Ht 63.0 in | Wt 182.0 lb

## 2015-10-16 DIAGNOSIS — K851 Biliary acute pancreatitis without necrosis or infection: Secondary | ICD-10-CM | POA: Diagnosis not present

## 2015-10-16 NOTE — Progress Notes (Signed)
Subjective:     Patient ID: Carolyn Hendricks, female   DOB: 09-06-1936, 80 y.o.   MRN: [id number]  HPI  80 yr old female recovering from recent hospitalization from acute gallstone pancreatitis.  She has multiple medical issues including CAD on plavix, HTN, SOB, Reactive airway disease, and CRF and history of MRSA infection.  Patient states feeling beter, getting her energy back slowly but still with some fatigue.  She denies any nausea or pain but appetite is low and she is having diarrhea some.  Explained that these are both normal to have with pancreatitis and will slowly improve.  She denies any fever, chills, nausea, vomiting, constipation, dysuria or hematuria.      Past Medical History  Diagnosis Date  . Heart murmur   . Head ache   . Chest pain   . Shortness of breath   . Dizziness   . High blood pressure   . Arthritis   . History of MRSA infection   . Chronic kidney disease   . Diastolic dysfunction    Past Surgical History  Procedure Laterality Date  . Carotid endarterectomy Left Dr. Lucky Cowboy  . Knee replacement (other) Left 2002  . Foot and ankle surgery    . Appendectomy  1950  . Cardiac catheterization N/A 06/12/2015    Procedure: Left Heart Cath and Coronary Angiography;  Surgeon: Minna Merritts, MD;  Location: [facility];  Service: Cardiovascular;  Laterality: N/A;   Family History  Problem Relation Age of Onset  . Congestive Heart Failure Mother 75  . Cancer Brother   . Kidney failure Father    Social History   Social History  . Marital Status: Widowed    Spouse Name: N/A  . Number of Children: N/A  . Years of Education: 12   Occupational History  . retired    Social History Main Topics  . Smoking status: Former Smoker -- 1.00 packs/day for 26 years    Types: Cigarettes    Quit date: 10/11/2011  . Smokeless tobacco: None     Comment: Patient has smoked for 26 years; has quit but now started back 0.25 ppw.  . Alcohol Use: No  . Drug Use: No  .  Sexual Activity: Not Asked   Other Topics Concern  . None   Social History Narrative    Current outpatient prescriptions:  .  aspirin 81 MG tablet, Take 81 mg by mouth daily., Disp: , Rfl:  .  cetirizine (ZYRTEC) 10 MG tablet, Take 10 mg by mouth daily., Disp: , Rfl:  .  clopidogrel (PLAVIX) 75 MG tablet, Take 75 mg by mouth daily., Disp: , Rfl:  .  Cyanocobalamin (VITAMIN B-12 PO), Take by mouth daily., Disp: , Rfl:  .  diltiazem (CARDIZEM) 120 MG tablet, Take 1 tablet by mouth daily., Disp: , Rfl:  .  fluticasone (FLONASE) 50 MCG/ACT nasal spray, INHALE ONE TO TWO SPRAY IN EACH NOSTRIL DAILY AS NEEDED, Disp: 16 g, Rfl: 6 .  Fluticasone-Salmeterol (ADVAIR DISKUS) 250-50 MCG/DOSE AEPB, Inhale 1 puff into the lungs every 12 (twelve) hours., Disp: 60 each, Rfl: 12 .  furosemide (LASIX) 20 MG tablet, Take 1 tablet by mouth daily., Disp: , Rfl:  .  HYDROcodone-acetaminophen (NORCO) 10-325 MG tablet, Take 1 tablet by mouth every 4 (four) hours as needed., Disp: 180 tablet, Rfl: 0 .  isosorbide mononitrate (IMDUR) 30 MG 24 hr tablet, Take 1 tablet (30 mg total) by mouth daily., Disp: 30 tablet, Rfl: 11 .  levocetirizine (XYZAL) 5 MG tablet, Take 5 mg by mouth every evening., Disp: , Rfl:  .  levothyroxine (SYNTHROID, LEVOTHROID) 75 MCG tablet, Take 1 tablet (75 mcg total) by mouth daily., Disp: 30 tablet, Rfl: 6 .  LORazepam (ATIVAN) 0.5 MG tablet, Take 0.5 mg by mouth every 8 (eight) hours., Disp: , Rfl:  .  meloxicam (MOBIC) 15 MG tablet, Take 1 tablet by mouth daily., Disp: , Rfl:  .  metoprolol (LOPRESSOR) 50 MG tablet, Take 1.5 tablets (75 mg total) by mouth 2 (two) times daily., Disp: 90 tablet, Rfl: 0 .  Multiple Vitamin (MULTI-VITAMIN DAILY PO), Take by mouth., Disp: , Rfl:  .  mupirocin ointment (BACTROBAN) 2 %, Apply 1 application topically 3 (three) times daily. , Disp: , Rfl:  .  nitroGLYCERIN (NITROSTAT) 0.4 MG SL tablet, Place 1 tablet (0.4 mg total) under the tongue every 5 (five)  minutes as needed for chest pain., Disp: 25 tablet, Rfl: 3 .  pantoprazole (PROTONIX) 40 MG tablet, Take 1 tablet (40 mg total) by mouth daily., Disp: 30 tablet, Rfl: 0 .  sertraline (ZOLOFT) 50 MG tablet, 0.5 to 1 tablet daily, Disp: 30 tablet, Rfl: 6 .  simvastatin (ZOCOR) 20 MG tablet, Take 1 tablet (20 mg total) by mouth daily at 6 PM., Disp: 90 tablet, Rfl: 3 .  traZODone (DESYREL) 100 MG tablet, 1/2-1 tablet at bedtime as needed, Disp: 30 tablet, Rfl: 5 .  valsartan-hydrochlorothiazide (DIOVAN-HCT) 320-12.5 MG per tablet, Take 1 tablet by mouth daily., Disp: , Rfl:  Allergies  Allergen Reactions  . Atorvastatin     Other reaction(s): Muscle Pain  . Crestor  [Rosuvastatin Calcium]     Other reaction(s): Muscle Pain  . Pravastatin Sodium     Muscle pain    Filed Vitals:   10/16/15 1004  BP: 160/80  Pulse: 55  Temp: 97.9 F (36.6 C)      Review of Systems  Constitutional: Positive for activity change, appetite change and fatigue. Negative for chills and unexpected weight change.  HENT: Negative for congestion and sore throat.   Respiratory: Negative for chest tightness, shortness of breath and wheezing.   Cardiovascular: Negative for chest pain, palpitations and leg swelling.  Gastrointestinal: Positive for abdominal pain and diarrhea. Negative for nausea, vomiting, constipation, blood in stool and abdominal distention.  Genitourinary: Negative for dysuria and hematuria.  Musculoskeletal: Negative for back pain, joint swelling and neck stiffness.  Skin: Negative.  Negative for color change, pallor, rash and wound.  Neurological: Negative for dizziness and weakness.  Hematological: Negative for adenopathy. Does not bruise/bleed easily.  Psychiatric/Behavioral: Negative for agitation. The patient is not hyperactive.   All other systems reviewed and are negative.      Objective:   Physical Exam  Constitutional: She is oriented to person, place, and time. She appears  well-developed and well-nourished. No distress.  HENT:  Head: Normocephalic and atraumatic.  Right Ear: External ear normal.  Left Ear: External ear normal.  Nose: Nose normal.  Mouth/Throat: Oropharynx is clear and moist. No oropharyngeal exudate.  Eyes: Conjunctivae are normal. Pupils are equal, round, and reactive to light. No scleral icterus.  Neck: Normal range of motion. Neck supple. No tracheal deviation present.  Cardiovascular: Normal rate, regular rhythm, normal heart sounds and intact distal pulses.  Exam reveals no gallop and no friction rub.   No murmur heard. Pulmonary/Chest: Effort normal and breath sounds normal. No respiratory distress. She has no wheezes. She has no rales.  Abdominal: Soft.  Bowel sounds are normal. She exhibits no distension. There is no tenderness. There is no rebound and no guarding.  Musculoskeletal: Normal range of motion. She exhibits no edema or tenderness.  Neurological: She is alert and oriented to person, place, and time. No cranial nerve deficit.  Skin: Skin is warm and dry. No rash noted. No erythema. No pallor.  Psychiatric: She has a normal mood and affect. Her behavior is normal. Judgment and thought content normal.  Vitals reviewed.      Assessment:     80 yr old recovering from hospitalization for Acute gallstone pancreatitis      Plan:     I reviewed the records from recent hospitalization, i personally reviewed her labs showing elevated T bili and lipase which resolve over hospital course.  I also reviewed her images of U/S showing cholelithiasis and dilated CBD, as well as reviewed the radiology reads.  I explained to her that she has about a 30% risk of having pancreatitis or cholangitis in the future, but given her history with RUQ intermittently would likely continue to have biliary colic.  I also explained that an ERCP with sphincterotomy would drop the likelihood of pancreatitis or cholangitis down to about 10%.    The risks,  benefits, complications, treatment options, and expected outcomes were discussed with the patient. The possibilities of bleeding, recurrent infection, finding a normal gallbladder, perforation of viscus organs, damage to surrounding structures, bile leak, abscess formation, needing a drain placed, the need for additional procedures, reaction to medication, pulmonary aspiration,  failure to diagnose a condition, the possible need to convert to an open procedure, and creating a complication requiring transfusion or operation were discussed with the patient. I also explained to the patient that given her extensive heart history, smoking history and functional status she would be at an increased risk of MI, stroke, blood clots in legs and lungs, ICU stay, need for ventilator and need for rehab or nursing home placement after operation.  Patient and daughter would like some time to think over the options before deciding on surgery.  Will have her return in 4 week to discuss.

## 2015-10-16 NOTE — Patient Instructions (Signed)
Go home and think about surgery and when you come back, you will let me know if we move forward with surgery.

## 2015-10-19 ENCOUNTER — Inpatient Hospital Stay: Payer: Self-pay | Admitting: Family Medicine

## 2015-10-22 ENCOUNTER — Ambulatory Visit (INDEPENDENT_AMBULATORY_CARE_PROVIDER_SITE_OTHER): Payer: Medicare Other | Admitting: Family Medicine

## 2015-10-22 ENCOUNTER — Encounter: Payer: Self-pay | Admitting: Family Medicine

## 2015-10-22 VITALS — BP 142/98 | HR 67 | Temp 97.7°F | Resp 16 | Wt 183.0 lb

## 2015-10-22 DIAGNOSIS — J449 Chronic obstructive pulmonary disease, unspecified: Secondary | ICD-10-CM | POA: Diagnosis not present

## 2015-10-22 DIAGNOSIS — K851 Biliary acute pancreatitis without necrosis or infection: Secondary | ICD-10-CM

## 2015-10-22 DIAGNOSIS — J45909 Unspecified asthma, uncomplicated: Secondary | ICD-10-CM | POA: Diagnosis not present

## 2015-10-22 DIAGNOSIS — I6529 Occlusion and stenosis of unspecified carotid artery: Secondary | ICD-10-CM

## 2015-10-22 MED ORDER — ACLIDINIUM BROMIDE 400 MCG/ACT IN AEPB: 1.0000 | INHALATION_SPRAY | Freq: Two times a day (BID) | RESPIRATORY_TRACT | Status: DC

## 2015-10-22 NOTE — Progress Notes (Signed)
Patient: Carolyn Hendricks Female    DOB: 22-Mar-1936   80 y.o.   MRN: [id number] Visit Date: 10/22/2015  Today's Provider: [name], MD   Chief Complaint  Patient presents with  . Hospitalization Follow-up   Subjective:    HPI   Follow up Hospitalization  Patient was admitted to [facility] on 09/28/2015 and discharged on 10/02/2015. She was treated for abdominal pain. Treatment for this included labs, ekg, abdominal US, and labs. US showed numerous gallstones.  She reports fair compliance with treatment. She reports this condition is Improved.  ---------------------------------------------------------------------- He was hospitalized for acute abdominal pain due to gallstone pancreatitis. Her symptoms resolved with bowel rest and IV fluids. She had follow up at [facility]. GI on 10/15/2015 and lipase was slightly elevated at 190.  Liver functions were normal. She had follow up with surgeon, Dr. Azalee Course, on 10/16/2015 to discuss risks and benefits of cholecystectomy and is to follow up in February. She anticipates having cholecystectomy after visit in February. She is in process of scheduled pre-op with Dr. Rockey Situ due to history of CAD and carotid artery disease. She was started on Advair in August after spirometry revealed severe obstruction and limited vital capacity. She states her breathing has been much better since then, but she still gets short of breath with exertion. She does complain of persistant nasal and post nasal drainage that irritates her throat and makes her cough.    BMP Latest Ref Rng 10/02/2015 10/01/2015 09/30/2015  Glucose 65 - 99 mg/dL 142(H) 95 64(L)  BUN 6 - 20 mg/dL 35(H) 33(H) 29(H)  Creatinine 0.44 - 1.00 mg/dL 1.17(H) 1.45(H) 1.33(H)  BUN/Creat Ratio 11 - 26 - - -  Sodium 135 - 145 mmol/L 141 140 140  Potassium 3.5 - 5.1 mmol/L 4.1 3.9 4.3  Chloride 101 - 111 mmol/L 108 106 111  CO2 22 - 32 mmol/L 26 23 21(L)  Calcium 8.9 - 10.3 mg/dL 9.1 8.6(L) 9.1       Allergies  Allergen Reactions  . Atorvastatin     Other reaction(s): Muscle Pain  . Crestor  [Rosuvastatin Calcium]     Other reaction(s): Muscle Pain  . Pravastatin Sodium     Muscle pain   Previous Medications   ASPIRIN 81 MG TABLET    Take 81 mg by mouth daily.   CETIRIZINE (ZYRTEC) 10 MG TABLET    Take 10 mg by mouth daily.   CLOPIDOGREL (PLAVIX) 75 MG TABLET    Take 75 mg by mouth daily.   CYANOCOBALAMIN (VITAMIN B-12 PO)    Take by mouth daily.   DILTIAZEM (CARDIZEM) 120 MG TABLET    Take 1 tablet by mouth daily.   FLUTICASONE (FLONASE) 50 MCG/ACT NASAL SPRAY    INHALE ONE TO TWO SPRAY IN EACH NOSTRIL DAILY AS NEEDED   FLUTICASONE-SALMETEROL (ADVAIR DISKUS) 250-50 MCG/DOSE AEPB    Inhale 1 puff into the lungs every 12 (twelve) hours.   FUROSEMIDE (LASIX) 20 MG TABLET    Take 1 tablet by mouth daily.   HYDROCODONE-ACETAMINOPHEN (NORCO) 10-325 MG TABLET    Take 1 tablet by mouth every 4 (four) hours as needed.   ISOSORBIDE MONONITRATE (IMDUR) 30 MG 24 HR TABLET    Take 1 tablet (30 mg total) by mouth daily.   LEVOCETIRIZINE (XYZAL) 5 MG TABLET    Take 5 mg by mouth every evening.   LEVOTHYROXINE (SYNTHROID, LEVOTHROID) 75 MCG TABLET    Take 1 tablet (75 mcg total)  by mouth daily.   LORAZEPAM (ATIVAN) 0.5 MG TABLET    Take 0.5 mg by mouth every 8 (eight) hours.   MELOXICAM (MOBIC) 15 MG TABLET    Take 1 tablet by mouth daily.   METOPROLOL (LOPRESSOR) 50 MG TABLET    Take 1.5 tablets (75 mg total) by mouth 2 (two) times daily.   METOPROLOL TARTRATE 75 MG TABS    Take 75 mg by mouth 2 (two) times daily.   MULTIPLE VITAMIN (MULTI-VITAMIN DAILY PO)    Take by mouth.   MUPIROCIN OINTMENT (BACTROBAN) 2 %    Apply 1 application topically 3 (three) times daily.    NITROGLYCERIN (NITROSTAT) 0.4 MG SL TABLET    Place 1 tablet (0.4 mg total) under the tongue every 5 (five) minutes as needed for chest pain.   PANTOPRAZOLE (PROTONIX) 40 MG TABLET    Take 1 tablet (40 mg total) by mouth  daily.   SERTRALINE (ZOLOFT) 50 MG TABLET    0.5 to 1 tablet daily   SIMVASTATIN (ZOCOR) 20 MG TABLET    Take 1 tablet (20 mg total) by mouth daily at 6 PM.   TRAZODONE (DESYREL) 100 MG TABLET    1/2-1 tablet at bedtime as needed   VALSARTAN-HYDROCHLOROTHIAZIDE (DIOVAN-HCT) 320-12.5 MG PER TABLET    Take 1 tablet by mouth daily.    Review of Systems  Constitutional: Positive for appetite change and fatigue. Negative for fever and chills.  HENT: Positive for postnasal drip.   Respiratory: Positive for cough, chest tightness and shortness of breath.   Cardiovascular: Negative for chest pain and palpitations.  Gastrointestinal: Positive for nausea and abdominal pain. Negative for vomiting.  Neurological: Positive for headaches. Negative for dizziness and weakness.   Patient Active Problem List   Diagnosis Date Noted  . COPD (chronic obstructive pulmonary disease) ([place]) 10/22/2015  . Peripheral vascular disease ([place]) 10/15/2015  . Difficulty in walking 10/15/2015  . Airway hyperreactivity 10/15/2015  . Gallstone pancreatitis   . MRSA cellulitis 06/29/2015  . PAD (peripheral artery disease) ([place])   . SOB (shortness of breath)   . LBBB (left bundle branch block)   . Angina pectoris ([place]) 05/14/2015  . Leg swelling 05/14/2015  . Allergic rhinitis 04/17/2015  . Anxiety 04/17/2015  . Arthralgia of hand 04/17/2015  . Chronic kidney disease (CKD), stage III (moderate) 04/17/2015  . Fatigue 04/17/2015  . Numbness and tingling 04/17/2015  . Nonspecific abnormal finding in stool contents 04/17/2015  . Restless leg 04/17/2015  . Hypothyroidism 07/09/2014  . Abnormal mammogram 11/01/2012  . Carotid stenosis 03/28/2012  . Atrial tachycardia ([place]) 03/28/2012  . Hyperlipidemia 03/28/2012  . Carotid artery obstruction 03/28/2012  . Supraventricular tachycardia ([place]) 03/28/2012  . Hyperpotassemia 02/27/2010  . Heart murmur 03/10/2009  . Insomnia 03/05/2009  . Essential (primary) hypertension  01/29/2009  . Goiter 01/29/2009  . History of tobacco use 01/29/2009  . Spinal stenosis of lumbar region without neurogenic claudication 07/14/2003   Past Medical History  Diagnosis Date  . Heart murmur   . Head ache   . Chest pain   . Shortness of breath   . Dizziness   . High blood pressure   . Arthritis   . History of MRSA infection   . Chronic kidney disease   . Diastolic dysfunction    Past Surgical History  Procedure Laterality Date  . Carotid endarterectomy Left Dr. Lucky Cowboy  . Knee replacement (other) Left 2002  . Foot and ankle surgery    .  Appendectomy  1950  . Cardiac catheterization N/A 06/12/2015    Procedure: Left Heart Cath and Coronary Angiography;  Surgeon: Minna Merritts, MD;  Location: [facility];  Service: Cardiovascular;  Laterality: N/A;    Social History  Substance Use Topics  . Smoking status: Former Smoker -- 1.00 packs/day for 26 years    Types: Cigarettes    Quit date: 10/11/2011  . Smokeless tobacco: Not on file     Comment: Patient has smoked for 26 years; has quit but now started back 0.25 ppw.  . Alcohol Use: No   Objective:   BP 142/98 mmHg  Pulse 67  Temp(Src) 97.7 F (36.5 C) (Oral)  Resp 16  Wt 183 lb (83.008 kg)  SpO2 95%  Physical Exam   General Appearance:    Alert, cooperative, no distress  Eyes:    PERRL, conjunctiva/corneas clear, EOM's intact       Lungs:     Clear to auscultation bilaterally, respirations unlabored. Diminished breath sounds. No wheezing  Heart:    Regular rate and rhythm. III/VI systolic murmur  Neurologic:   Awake, alert, oriented x 3. No apparent focal neurological           defect.      Spirometry shows moderately severe obstruction. No significant change from 05/13/2015     Assessment & Plan:     1. RAD (reactive airway disease) with wheezing, unspecified asthma severity, uncomplicated   - Spirometry with Graph  2. Chronic obstructive pulmonary disease, unspecified COPD type  ([place]) Subjectively improved since addition of Advair, but spirometry unchanged.  Add Tudorza 400 1 puff twice a day and follow up the same week as her surgery follow up in February.   3. Gallstone pancreatitis Symptomatically resolved, but significant risk for future recurrence. She anticipates having cholecystectomy when cleared by cardiology and is in process of arranging follow up with Dr. Rockey Situ  4. Carotid artery obstruction, unspecified laterality Asymptomatic. Compliant with medication.  Continue aggressive risk factor modification.    Addressed extensive list of chronic and acute medical problems today requiring extensive time in counseling and coordination care.  Over half of this 45 minute visit were spent in counseling and coordinating care of multiple medical problems.       [name], MD  [facility]

## 2015-11-02 ENCOUNTER — Telehealth: Payer: Self-pay

## 2015-11-02 ENCOUNTER — Ambulatory Visit: Payer: Self-pay | Admitting: Family Medicine

## 2015-11-02 NOTE — Telephone Encounter (Signed)
This is the third fax request to obtain cardiac clearance. Still awaiting for clearance.

## 2015-11-03 ENCOUNTER — Telehealth: Payer: Self-pay | Admitting: Cardiovascular Disease

## 2015-11-03 NOTE — Telephone Encounter (Signed)
[facility] needs cardiac clearance .  Have faxed request several times no response.  Please call Herb Grays

## 2015-11-05 NOTE — Telephone Encounter (Signed)
Received cardiac clearance request for pt to proceed w/ surgery TBA w/ [facility]. Per Dr. Rockey Situ, pt has permission to stop Plavix for 5 days prior to procedure. Faxed to [phone].

## 2015-11-06 ENCOUNTER — Telehealth: Payer: Self-pay | Admitting: Family Medicine

## 2015-11-06 DIAGNOSIS — M2550 Pain in unspecified joint: Secondary | ICD-10-CM

## 2015-11-06 MED ORDER — HYDROCODONE-ACETAMINOPHEN 10-325 MG PO TABS: 1.0000 | ORAL_TABLET | ORAL | Status: DC | PRN

## 2015-11-06 NOTE — Telephone Encounter (Signed)
Pt's daughter Hilda Blades contacted office for refill request on the following medications:  HYDROcodone-acetaminophen (NORCO) 10-325 MG tablet. Hilda Blades would like to pick this up today if possible b/c pt will run out of medication over the weekend. Thanks TNP

## 2015-11-10 ENCOUNTER — Telehealth: Payer: Self-pay | Admitting: Surgery

## 2015-11-10 NOTE — Telephone Encounter (Signed)
Patients daughter, Hilda Blades, called and said patient wants to go ahead and have surgery but waiting on cardiac clearance. Please call

## 2015-11-10 NOTE — Telephone Encounter (Signed)
Called patient's daughter back at this time. She states that, "Dr. Azalee Course told me that we could cancel the appointment on 2/8 if she decided she just wanted to do surgery and her pain is getting worse so she wants to go ahead and set this up." According to Dr. Geoffry Paradise notes, she wanted to see her back in office prior to scheduling. I explained this to the patient's daughter but I will speak with Dr. Azalee Course to verify this prior to scheduling surgery. Clearance to remove plavix is in chart from Dr. Rockey Situ but we have not received clearance for surgery yet.

## 2015-11-10 NOTE — Telephone Encounter (Signed)
Spoke with Dr. Azalee Course regarding this. She wants patient to keep the 2/8 appointment so that she can talk more in depth about what the best option is for this patient.  No answer. Left voicemail with this information.   If patient returns phone call, please give information above and let them know appointment has been kept with Dr. Azalee Course for 2/8.

## 2015-11-16 ENCOUNTER — Other Ambulatory Visit: Payer: Self-pay | Admitting: Family Medicine

## 2015-11-18 ENCOUNTER — Encounter: Payer: Self-pay | Admitting: Surgery

## 2015-11-18 ENCOUNTER — Ambulatory Visit (INDEPENDENT_AMBULATORY_CARE_PROVIDER_SITE_OTHER): Payer: Medicare Other | Admitting: Surgery

## 2015-11-18 ENCOUNTER — Telehealth: Payer: Self-pay | Admitting: Surgery

## 2015-11-18 VITALS — BP 121/71 | HR 61 | Temp 97.3°F | Wt 179.0 lb

## 2015-11-18 DIAGNOSIS — K851 Biliary acute pancreatitis without necrosis or infection: Secondary | ICD-10-CM | POA: Diagnosis not present

## 2015-11-18 DIAGNOSIS — I6529 Occlusion and stenosis of unspecified carotid artery: Secondary | ICD-10-CM

## 2015-11-18 NOTE — Progress Notes (Signed)
Subjective:     Patient ID: Carolyn Hendricks, female   DOB: Oct 22, 1935, 80 y.o.   MRN: [id number]  HPI  80 yr old female getting some mobility back after having gallstone pancreatitis in Dec 2016. She has multiple medical issues including CAD on plavix, HTN, SOB, Reactive airway disease, and CRF and history of MRSA infection. Patient states feeling beter, getting her energy back slowly but still with some fatigue.She does still have occasional  Epigastric pain right upper quadrant pain with certain foods especially if they're greasier. Patient states that she does still have some diarrhea stools depending on what she eats as well. Patient denies any fever chills nausea vomiting abdominal pain changes in her bowels constipation dysuria or hematuria.  Past Medical History  Diagnosis Date  . Heart murmur   . Head ache   . Chest pain   . Shortness of breath   . Dizziness   . High blood pressure   . Arthritis   . History of MRSA infection   . Chronic kidney disease   . Diastolic dysfunction    Past Surgical History  Procedure Laterality Date  . Carotid endarterectomy Left Dr. Lucky Cowboy  . Knee replacement (other) Left 2002  . Foot and ankle surgery    . Appendectomy  1950  . Cardiac catheterization N/A 06/12/2015    Procedure: Left Heart Cath and Coronary Angiography;  Surgeon: Minna Merritts, MD;  Location: [facility];  Service: Cardiovascular;  Laterality: N/A;   Family History  Problem Relation Age of Onset  . Congestive Heart Failure Mother 42  . Cancer Brother   . Kidney failure Father    Social History   Social History  . Marital Status: Widowed    Spouse Name: N/A  . Number of Children: N/A  . Years of Education: 12   Occupational History  . retired    Social History Main Topics  . Smoking status: Former Smoker -- 1.00 packs/day for 26 years    Types: Cigarettes    Quit date: 10/11/2011  . Smokeless tobacco: None     Comment: Patient has smoked for 26 years;  has quit but now started back 0.25 ppw.  . Alcohol Use: No  . Drug Use: No  . Sexual Activity: Not Asked   Other Topics Concern  . None   Social History Narrative    Current outpatient prescriptions:  .  Aclidinium Bromide (TUDORZA PRESSAIR) 400 MCG/ACT AEPB, Inhale 1 puff into the lungs 2 (two) times daily., Disp: 2 each, Rfl: 0 .  aspirin 81 MG tablet, Take 81 mg by mouth daily., Disp: , Rfl:  .  cetirizine (ZYRTEC) 10 MG tablet, Take 10 mg by mouth daily., Disp: , Rfl:  .  clopidogrel (PLAVIX) 75 MG tablet, Take 75 mg by mouth daily., Disp: , Rfl:  .  Cyanocobalamin (VITAMIN B-12 PO), Take by mouth daily., Disp: , Rfl:  .  diltiazem (CARDIZEM) 120 MG tablet, Take 1 tablet by mouth daily., Disp: , Rfl:  .  fluticasone (FLONASE) 50 MCG/ACT nasal spray, INHALE ONE TO TWO SPRAY IN EACH NOSTRIL DAILY AS NEEDED, Disp: 16 g, Rfl: 6 .  Fluticasone-Salmeterol (ADVAIR DISKUS) 250-50 MCG/DOSE AEPB, Inhale 1 puff into the lungs every 12 (twelve) hours., Disp: 60 each, Rfl: 12 .  furosemide (LASIX) 20 MG tablet, Take 1 tablet by mouth daily., Disp: , Rfl:  .  HYDROcodone-acetaminophen (NORCO) 10-325 MG tablet, Take 1 tablet by mouth every 4 (four) hours as needed.,  Disp: 180 tablet, Rfl: 0 .  isosorbide mononitrate (IMDUR) 30 MG 24 hr tablet, Take 1 tablet (30 mg total) by mouth daily., Disp: 30 tablet, Rfl: 11 .  levocetirizine (XYZAL) 5 MG tablet, Take 5 mg by mouth every evening., Disp: , Rfl:  .  levothyroxine (SYNTHROID, LEVOTHROID) 75 MCG tablet, TAKE 1 TABLET BY MOUTH DAILY, Disp: 30 tablet, Rfl: 4 .  LORazepam (ATIVAN) 0.5 MG tablet, Take 0.5 mg by mouth every 8 (eight) hours., Disp: , Rfl:  .  meloxicam (MOBIC) 15 MG tablet, Take 1 tablet by mouth daily., Disp: , Rfl:  .  metoprolol (LOPRESSOR) 50 MG tablet, Take 1.5 tablets (75 mg total) by mouth 2 (two) times daily., Disp: 90 tablet, Rfl: 0 .  Metoprolol Tartrate 75 MG TABS, Take 75 mg by mouth 2 (two) times daily., Disp: , Rfl:  .   Multiple Vitamin (MULTI-VITAMIN DAILY PO), Take by mouth., Disp: , Rfl:  .  mupirocin ointment (BACTROBAN) 2 %, Apply 1 application topically 3 (three) times daily. , Disp: , Rfl:  .  nitroGLYCERIN (NITROSTAT) 0.4 MG SL tablet, Place 1 tablet (0.4 mg total) under the tongue every 5 (five) minutes as needed for chest pain., Disp: 25 tablet, Rfl: 3 .  pantoprazole (PROTONIX) 40 MG tablet, Take 1 tablet (40 mg total) by mouth daily., Disp: 30 tablet, Rfl: 0 .  sertraline (ZOLOFT) 50 MG tablet, 0.5 to 1 tablet daily, Disp: 30 tablet, Rfl: 6 .  simvastatin (ZOCOR) 20 MG tablet, Take 1 tablet (20 mg total) by mouth daily at 6 PM., Disp: 90 tablet, Rfl: 3 .  traZODone (DESYREL) 100 MG tablet, 1/2-1 tablet at bedtime as needed, Disp: 30 tablet, Rfl: 5 .  valsartan-hydrochlorothiazide (DIOVAN-HCT) 320-12.5 MG per tablet, Take 1 tablet by mouth daily., Disp: , Rfl:  Allergies  Allergen Reactions  . Atorvastatin     Other reaction(s): Muscle Pain  . Crestor  [Rosuvastatin Calcium]     Other reaction(s): Muscle Pain  . Pravastatin Sodium     Muscle pain     Review of Systems  Constitutional: Negative for fever, activity change, appetite change and fatigue.  HENT: Negative for congestion and sore throat.   Respiratory: Negative for cough, chest tightness and shortness of breath.   Cardiovascular: Negative for chest pain, palpitations and leg swelling.  Gastrointestinal: Negative for vomiting, abdominal pain, constipation, blood in stool and abdominal distention.  Genitourinary: Negative for urgency and hematuria.  Musculoskeletal: Negative for myalgias, back pain and gait problem.  Skin: Negative for color change, pallor, rash and wound.  Neurological: Negative for dizziness and weakness.  Hematological: Negative for adenopathy. Does not bruise/bleed easily.  Psychiatric/Behavioral: Negative for agitation. The patient is not nervous/anxious.   All other systems reviewed and are negative.  Filed  Vitals:   11/18/15 1109  BP: 121/71  Pulse: 61  Temp: 97.3 F (36.3 C)       Objective:   Physical Exam  Constitutional: She is oriented to person, place, and time. She appears well-developed and well-nourished. No distress.  Cardiovascular: Normal rate, regular rhythm, normal heart sounds and intact distal pulses.  Exam reveals no gallop and no friction rub.   No murmur heard. Pulmonary/Chest: Effort normal and breath sounds normal. No respiratory distress. She has no wheezes. She has no rales.  Abdominal: Soft. Bowel sounds are normal. She exhibits no distension. There is no tenderness. There is no rebound.  Neurological: She is alert and oriented to person, place, and time. No  cranial nerve deficit.  Skin: Skin is warm and dry. No rash noted. No erythema. No pallor.  Psychiatric: She has a normal mood and affect. Her behavior is normal. Judgment and thought content normal.  Vitals reviewed.  CBC Latest Ref Rng 09/30/2015 09/29/2015 09/28/2015  WBC 3.6 - 11.0 K/uL 9.6 8.1 7.5  Hemoglobin 12.0 - 16.0 g/dL 11.9(L) 11.3(L) 12.6  Hematocrit 35.0 - 47.0 % 38.7 36.1 39.8  Platelets 150 - 440 K/uL 154 167 193    CMP Latest Ref Rng 10/02/2015 10/01/2015 09/30/2015  Glucose 65 - 99 mg/dL 142(H) 95 64(L)  BUN 6 - 20 mg/dL 35(H) 33(H) 29(H)  Creatinine 0.44 - 1.00 mg/dL 1.17(H) 1.45(H) 1.33(H)  Sodium 135 - 145 mmol/L 141 140 140  Potassium 3.5 - 5.1 mmol/L 4.1 3.9 4.3  Chloride 101 - 111 mmol/L 108 106 111  CO2 22 - 32 mmol/L 26 23 21(L)  Calcium 8.9 - 10.3 mg/dL 9.1 8.6(L) 9.1  Total Protein 6.5 - 8.1 g/dL - 6.7 6.5  Total Bilirubin 0.3 - 1.2 mg/dL - 0.9 1.0  Alkaline Phos 38 - 126 U/L - 117 134(H)  AST 15 - 41 U/L - 45(H) 73(H)  ALT 14 - 54 U/L - 104(H) 144(H)        Assessment:    80 yr old female s/p gallstone pancreatitis    Plan:     She has obtained cardiology clearance and is ok to be off plavix 5 days prior to OR.  The risks, benefits, complications, treatment  options, and expected outcomes were discussed with the patient. The possibilities of bleeding, recurrent infection, finding a normal gallbladder, perforation of viscus organs, damage to surrounding structures, bile leak, abscess formation, needing a drain placed, the need for additional procedures, reaction to medication, pulmonary aspiration,  failure to diagnose a condition, the possible need to convert to an open procedure, and creating a complication requiring transfusion or operation were discussed with the patient. The patient and/or family concurred with the proposed plan, giving informed consent.  I will set her up for Feb 21st, and to hold plavix 5 days prior.  If stones are in the CBD she will have to be admitted but otherwise can do as outpatient procedure. Patient and family agree

## 2015-11-18 NOTE — Telephone Encounter (Signed)
Pt advised of pre op date/time and sx date. Sx: 12/01/15 with Dr Lenore Manner cholecystectomy with [facility]. Pre op: 11/23/15 @ 12:15pm--Office.

## 2015-11-18 NOTE — Patient Instructions (Addendum)
You have requested to have your Gallbladder removed. We will arrange this to be done on 12/01/15 at [facility]. We have received notification from your Cardiologist that it is ok to do this surgery and you will need to stop your Plavix for 5 days before your surgery. This would mean that your last dose of Plavix would be on 11/25/15. You would hold this medication from 2/16 through 2/20. We will restart this following surgery.  As we explained, we will put dye in during the surgery to see if a stone is present in the duct. If this is the case, you will need to be admitted to the hospital and be seen by the gastroenterologist to have this stone removed. If there is no stone present, we will do this as an outpatient procedure.  Please avoid greasy and fried foods if at all possible prior to your scheduled surgery to decrease symptoms until then. You will also want to start with a bland diet afterwards and advance as tolerated.  Please see the [facility]) pre-care form you have been given today.  If you have any questions or concerns please call our office.

## 2015-11-20 ENCOUNTER — Ambulatory Visit: Payer: Self-pay | Admitting: Family Medicine

## 2015-11-23 ENCOUNTER — Ambulatory Visit
Admission: RE | Admit: 2015-11-23 | Discharge: 2015-11-23 | Disposition: A | Discharge status: Home / Self Care | Payer: Medicare Other | Source: Ambulatory Visit | Attending: Surgery | Admitting: Surgery

## 2015-11-23 ENCOUNTER — Encounter
Admission: RE | Admit: 2015-11-23 | Discharge: 2015-11-23 | Disposition: A | Discharge status: Home / Self Care | Payer: Medicare Other | Source: Ambulatory Visit | Attending: Surgery | Admitting: Surgery

## 2015-11-23 DIAGNOSIS — Z01812 Encounter for preprocedural laboratory examination: Secondary | ICD-10-CM | POA: Diagnosis not present

## 2015-11-23 DIAGNOSIS — Z01818 Encounter for other preprocedural examination: Secondary | ICD-10-CM | POA: Diagnosis present

## 2015-11-23 DIAGNOSIS — J42 Unspecified chronic bronchitis: Secondary | ICD-10-CM | POA: Diagnosis not present

## 2015-11-23 DIAGNOSIS — I10 Essential (primary) hypertension: Secondary | ICD-10-CM | POA: Diagnosis not present

## 2015-11-23 DIAGNOSIS — R011 Cardiac murmur, unspecified: Secondary | ICD-10-CM | POA: Diagnosis not present

## 2015-11-23 DIAGNOSIS — I517 Cardiomegaly: Secondary | ICD-10-CM | POA: Diagnosis not present

## 2015-11-23 DIAGNOSIS — Z87891 Personal history of nicotine dependence: Secondary | ICD-10-CM | POA: Insufficient documentation

## 2015-11-23 LAB — SURGICAL PCR SCREEN
MRSA, PCR: POSITIVE — AB
STAPHYLOCOCCUS AUREUS: POSITIVE — AB

## 2015-11-23 LAB — COMPREHENSIVE METABOLIC PANEL
ALBUMIN: 4 g/dL (ref 3.5–5.0)
ALT: 16 U/L (ref 14–54)
AST: 20 U/L (ref 15–41)
Alkaline Phosphatase: 83 U/L (ref 38–126)
Anion gap: 5 (ref 5–15)
BILIRUBIN TOTAL: 0.8 mg/dL (ref 0.3–1.2)
BUN: 24 mg/dL — ABNORMAL HIGH (ref 6–20)
CHLORIDE: 107 mmol/L (ref 101–111)
CO2: 27 mmol/L (ref 22–32)
Calcium: 9.1 mg/dL (ref 8.9–10.3)
Creatinine, Ser: 1.5 mg/dL — ABNORMAL HIGH (ref 0.44–1.00)
GFR calc Af Amer: 38 mL/min — ABNORMAL LOW (ref >60)
GFR calc non Af Amer: 32 mL/min — ABNORMAL LOW (ref >60)
Glucose, Bld: 100 mg/dL — ABNORMAL HIGH (ref 65–99)
POTASSIUM: 5.3 mmol/L — AB (ref 3.5–5.1)
Sodium: 139 mmol/L (ref 135–145)
TOTAL PROTEIN: 6.8 g/dL (ref 6.5–8.1)

## 2015-11-23 LAB — CBC WITH DIFFERENTIAL/PLATELET
BASOS ABS: 0.1 10*3/uL (ref 0–0.1)
BASOS PCT: 1 %
Eosinophils Absolute: 0.4 10*3/uL (ref 0–0.7)
Eosinophils Relative: 7 %
HEMATOCRIT: 35.5 % (ref 35.0–47.0)
Hemoglobin: 11.8 g/dL — ABNORMAL LOW (ref 12.0–16.0)
Lymphocytes Relative: 14 %
Lymphs Abs: 0.8 10*3/uL — ABNORMAL LOW (ref 1.0–3.6)
MCH: 29 pg (ref 26.0–34.0)
MCHC: 33.3 g/dL (ref 32.0–36.0)
MCV: 87 fL (ref 80.0–100.0)
MONO ABS: 0.4 10*3/uL (ref 0.2–0.9)
Monocytes Relative: 7 %
NEUTROS ABS: 4.5 10*3/uL (ref 1.4–6.5)
Neutrophils Relative %: 71 %
PLATELETS: 209 10*3/uL (ref 150–440)
RBC: 4.08 MIL/uL (ref 3.80–5.20)
RDW: 15.6 % — AB (ref 11.5–14.5)
WBC: 6.2 10*3/uL (ref 3.6–11.0)

## 2015-11-23 NOTE — Pre-Procedure Instructions (Signed)
MRSA positive, lab results including Met C faxed over the Dr. Geoffry Paradise office for review and treatment as indicated.  Amber notified of + MRSA.  Concerned by increased creatinine and decreased GFR since 10/01/16.

## 2015-11-23 NOTE — Patient Instructions (Signed)
  Your procedure is scheduled on: Tuesday Feb. 21, 2017. Report to Same Day Surgery. To find out your arrival time please call [phone] between 1PM - 3PM on Monday 20, 2017.  Remember: Instructions that are not followed completely may result in serious medical risk, up to and including death, or upon the discretion of your surgeon and anesthesiologist your surgery may need to be rescheduled.    _x___ 1. Do not eat food or drink liquids after midnight. No gum chewing or hard candies.     ____ 2. No Alcohol for 24 hours before or after surgery.   ____ 3. Bring all medications with you on the day of surgery if instructed.    __x__ 4. Notify your doctor if there is any change in your medical condition     (cold, fever, infections).     Do not wear jewelry, make-up, hairpins, clips or nail polish.  Do not wear lotions, powders, or perfumes. You may wear deodorant.  Do not shave 48 hours prior to surgery. Men may shave face and neck.  Do not bring valuables to the hospital.    [facility] is not responsible for any belongings or valuables.               Contacts, dentures or bridgework may not be worn into surgery.  Leave your suitcase in the car. After surgery it may be brought to your room.  For patients admitted to the hospital, discharge time is determined by your treatment team.   Patients discharged the day of surgery will not be allowed to drive home.    Please read over the following fact sheets that you were given:   [facility] Preparing for Surgery  _x___ Take these medicines the morning of surgery with A SIP OF WATER:    1. diltiazem (CARDIZEM)  2. isosorbide mononitrate (IMDUR)  3. levothyroxine (SYNTHROID, LEVOTHROID)  4.metoprolol (LOPRESSOR)  5.pantoprazole (PROTONIX)   ____ Fleet Enema (as directed)   _x___ Use CHG Soap as directed  _x___ Use inhalers on the day of surgery and bring inhalers to the hospital.  ____ Stop metformin 2 days prior to  surgery    ____ Take 1/2 of usual insulin dose the night before surgery and none on the morning of surgery.   _x___ Stop clopidogrel (PLAVIX) 5 days prior to surgery per Dr. Geoffry Paradise instructions.  _x___ Stop Anti-inflammatories such as meloxicam (MOBIC) now.  OK to take hydrocodone with tylenol for pain.   ____ Stop supplements until after surgery.    ____ Bring C-Pap to the hospital.

## 2015-11-23 NOTE — Pre-Procedure Instructions (Signed)
Notified Amber at Dr. Geoffry Paradise office regarding today's elevated potassium level.  Pt does have history chronic kidney disease, past potassium levels have been in this range.  Amber will let Dr. Azalee Course know.

## 2015-11-26 ENCOUNTER — Telehealth: Payer: Self-pay | Admitting: Surgery

## 2015-11-26 NOTE — Telephone Encounter (Signed)
Patient is positive for MRSA and Staph Aureus per Rosann Auerbach with Pre admit. She would like to know if Dr Azalee Course would like the patient to be on treatment for this prior to surgery. Surgery date is 12/29/15 with Dr Lenore Manner Cholecystectomy.

## 2015-11-26 NOTE — Telephone Encounter (Signed)
Contacted Dr. Azalee Course at this time. Awaiting return call for further orders.

## 2015-11-27 NOTE — Telephone Encounter (Signed)
Made Marsha in Pre-admit aware at this time.

## 2015-11-27 NOTE — Telephone Encounter (Signed)
Spoke with Dr. Azalee Course. No new orders at this time.

## 2015-11-27 NOTE — Pre-Procedure Instructions (Signed)
Dr. Marko Plume notified of + MRSA and Staph. A nasal swab results. Dr. Azalee Course is not going to do anything different for upcoming surgery per Amber.

## 2015-11-30 MED ORDER — PHENYLEPHRINE HCL 10 % OP SOLN
OPHTHALMIC | Status: AC
  Filled 2015-11-30: qty 5

## 2015-11-30 MED ORDER — MOXIFLOXACIN HCL 0.5 % OP SOLN
OPHTHALMIC | Status: AC
  Filled 2015-11-30: qty 3

## 2015-11-30 MED ORDER — CYCLOPENTOLATE HCL 2 % OP SOLN
OPHTHALMIC | Status: AC
  Filled 2015-11-30: qty 2

## 2015-12-01 ENCOUNTER — Ambulatory Visit: Payer: Medicare Other

## 2015-12-01 ENCOUNTER — Encounter
Admission: RE | Disposition: A | Discharge status: Home / Self Care | Payer: Self-pay | Source: Ambulatory Visit | Attending: Surgery

## 2015-12-01 ENCOUNTER — Observation Stay
Admission: RE | Admit: 2015-12-01 | Discharge: 2015-12-04 | Disposition: A | Discharge status: Home / Self Care | Payer: Medicare Other | Source: Ambulatory Visit | Attending: Surgery | Admitting: Surgery

## 2015-12-01 ENCOUNTER — Ambulatory Visit: Payer: Medicare Other | Admitting: Anesthesiology

## 2015-12-01 ENCOUNTER — Encounter: Payer: Self-pay | Admitting: *Deleted

## 2015-12-01 DIAGNOSIS — Z8249 Family history of ischemic heart disease and other diseases of the circulatory system: Secondary | ICD-10-CM | POA: Diagnosis not present

## 2015-12-01 DIAGNOSIS — Z888 Allergy status to other drugs, medicaments and biological substances status: Secondary | ICD-10-CM | POA: Diagnosis not present

## 2015-12-01 DIAGNOSIS — Z7902 Long term (current) use of antithrombotics/antiplatelets: Secondary | ICD-10-CM | POA: Insufficient documentation

## 2015-12-01 DIAGNOSIS — I129 Hypertensive chronic kidney disease with stage 1 through stage 4 chronic kidney disease, or unspecified chronic kidney disease: Secondary | ICD-10-CM | POA: Diagnosis not present

## 2015-12-01 DIAGNOSIS — Z7982 Long term (current) use of aspirin: Secondary | ICD-10-CM | POA: Insufficient documentation

## 2015-12-01 DIAGNOSIS — Z7951 Long term (current) use of inhaled steroids: Secondary | ICD-10-CM | POA: Insufficient documentation

## 2015-12-01 DIAGNOSIS — R0902 Hypoxemia: Secondary | ICD-10-CM

## 2015-12-01 DIAGNOSIS — M2550 Pain in unspecified joint: Secondary | ICD-10-CM

## 2015-12-01 DIAGNOSIS — K8010 Calculus of gallbladder with chronic cholecystitis without obstruction: Principal | ICD-10-CM | POA: Insufficient documentation

## 2015-12-01 DIAGNOSIS — Z8614 Personal history of Methicillin resistant Staphylococcus aureus infection: Secondary | ICD-10-CM | POA: Insufficient documentation

## 2015-12-01 DIAGNOSIS — N189 Chronic kidney disease, unspecified: Secondary | ICD-10-CM | POA: Diagnosis not present

## 2015-12-01 DIAGNOSIS — K851 Biliary acute pancreatitis without necrosis or infection: Secondary | ICD-10-CM | POA: Diagnosis not present

## 2015-12-01 DIAGNOSIS — Z87891 Personal history of nicotine dependence: Secondary | ICD-10-CM | POA: Diagnosis not present

## 2015-12-01 DIAGNOSIS — R918 Other nonspecific abnormal finding of lung field: Secondary | ICD-10-CM | POA: Diagnosis not present

## 2015-12-01 DIAGNOSIS — R0602 Shortness of breath: Secondary | ICD-10-CM | POA: Diagnosis not present

## 2015-12-01 DIAGNOSIS — K689 Other disorders of retroperitoneum: Secondary | ICD-10-CM | POA: Diagnosis not present

## 2015-12-01 DIAGNOSIS — Z79891 Long term (current) use of opiate analgesic: Secondary | ICD-10-CM | POA: Insufficient documentation

## 2015-12-01 DIAGNOSIS — Z841 Family history of disorders of kidney and ureter: Secondary | ICD-10-CM | POA: Insufficient documentation

## 2015-12-01 DIAGNOSIS — K429 Umbilical hernia without obstruction or gangrene: Secondary | ICD-10-CM | POA: Diagnosis not present

## 2015-12-01 DIAGNOSIS — Z419 Encounter for procedure for purposes other than remedying health state, unspecified: Secondary | ICD-10-CM

## 2015-12-01 DIAGNOSIS — Z96652 Presence of left artificial knee joint: Secondary | ICD-10-CM | POA: Diagnosis not present

## 2015-12-01 DIAGNOSIS — Z79899 Other long term (current) drug therapy: Secondary | ICD-10-CM | POA: Insufficient documentation

## 2015-12-01 DIAGNOSIS — I2510 Atherosclerotic heart disease of native coronary artery without angina pectoris: Secondary | ICD-10-CM | POA: Insufficient documentation

## 2015-12-01 DIAGNOSIS — J45909 Unspecified asthma, uncomplicated: Secondary | ICD-10-CM | POA: Insufficient documentation

## 2015-12-01 DIAGNOSIS — Z9049 Acquired absence of other specified parts of digestive tract: Secondary | ICD-10-CM

## 2015-12-01 DIAGNOSIS — M1991 Primary osteoarthritis, unspecified site: Secondary | ICD-10-CM | POA: Diagnosis not present

## 2015-12-01 DIAGNOSIS — Z9889 Other specified postprocedural states: Secondary | ICD-10-CM | POA: Insufficient documentation

## 2015-12-01 DIAGNOSIS — I739 Peripheral vascular disease, unspecified: Secondary | ICD-10-CM | POA: Diagnosis not present

## 2015-12-01 DIAGNOSIS — K8020 Calculus of gallbladder without cholecystitis without obstruction: Secondary | ICD-10-CM | POA: Diagnosis not present

## 2015-12-01 HISTORY — PX: CHOLECYSTECTOMY: SHX55

## 2015-12-01 LAB — POCT I-STAT 4, (NA,K, GLUC, HGB,HCT)
GLUCOSE: 99 mg/dL (ref 65–99)
HEMATOCRIT: 39 % (ref 36.0–46.0)
Hemoglobin: 13.3 g/dL (ref 12.0–15.0)
POTASSIUM: 4.8 mmol/L (ref 3.5–5.1)
SODIUM: 141 mmol/L (ref 135–145)

## 2015-12-01 SURGERY — LAPAROSCOPIC CHOLECYSTECTOMY WITH INTRAOPERATIVE CHOLANGIOGRAM
Anesthesia: General | Site: Abdomen | Wound class: Clean Contaminated

## 2015-12-01 MED ORDER — HYDROMORPHONE HCL 1 MG/ML IJ SOLN
0.2500 mg | INTRAMUSCULAR | Status: AC | PRN
  Administered 2015-12-01 (×8): 0.25 mg via INTRAVENOUS

## 2015-12-01 MED ORDER — EPHEDRINE SULFATE 50 MG/ML IJ SOLN
INTRAMUSCULAR | Status: DC | PRN
  Administered 2015-12-01 (×6): 10 mg via INTRAVENOUS

## 2015-12-01 MED ORDER — DILTIAZEM HCL 60 MG PO TABS
120.0000 mg | ORAL_TABLET | ORAL | Status: DC
  Administered 2015-12-02 – 2015-12-04 (×3): 120 mg via ORAL
  Filled 2015-12-01 (×3): qty 2

## 2015-12-01 MED ORDER — HYDRALAZINE HCL 20 MG/ML IJ SOLN: 10.0000 mg | INTRAMUSCULAR | Status: DC | PRN

## 2015-12-01 MED ORDER — PROPOFOL 10 MG/ML IV BOLUS
INTRAVENOUS | Status: DC | PRN
  Administered 2015-12-01: 120 mg via INTRAVENOUS
  Administered 2015-12-01: 50 mg via INTRAVENOUS

## 2015-12-01 MED ORDER — CEFOXITIN SODIUM-DEXTROSE 2-2.2 GM-% IV SOLR (PREMIX)
INTRAVENOUS | Status: AC
  Filled 2015-12-01: qty 50

## 2015-12-01 MED ORDER — CEFAZOLIN SODIUM-DEXTROSE 2-3 GM-% IV SOLR
INTRAVENOUS | Status: AC
  Filled 2015-12-01: qty 50

## 2015-12-01 MED ORDER — FUROSEMIDE 40 MG PO TABS
20.0000 mg | ORAL_TABLET | Freq: Two times a day (BID) | ORAL | Status: DC
  Administered 2015-12-02 – 2015-12-04 (×5): 20 mg via ORAL
  Filled 2015-12-01 (×5): qty 1

## 2015-12-01 MED ORDER — MUPIROCIN 2 % EX OINT
1.0000 "application " | TOPICAL_OINTMENT | Freq: Three times a day (TID) | CUTANEOUS | Status: DC
  Administered 2015-12-01 – 2015-12-04 (×8): 1 via TOPICAL
  Filled 2015-12-01: qty 22

## 2015-12-01 MED ORDER — LEVOTHYROXINE SODIUM 75 MCG PO TABS: 75.0000 ug | ORAL_TABLET | Freq: Every day | ORAL | Status: DC

## 2015-12-01 MED ORDER — MOMETASONE FURO-FORMOTEROL FUM 100-5 MCG/ACT IN AERO
2.0000 | INHALATION_SPRAY | Freq: Two times a day (BID) | RESPIRATORY_TRACT | Status: DC
  Administered 2015-12-01 – 2015-12-04 (×6): 2 via RESPIRATORY_TRACT
  Filled 2015-12-01: qty 8.8

## 2015-12-01 MED ORDER — CLOPIDOGREL BISULFATE 75 MG PO TABS
75.0000 mg | ORAL_TABLET | ORAL | Status: DC
  Administered 2015-12-02 – 2015-12-04 (×3): 75 mg via ORAL
  Filled 2015-12-01 (×3): qty 1

## 2015-12-01 MED ORDER — HYDROCODONE-ACETAMINOPHEN 10-325 MG PO TABS
1.0000 | ORAL_TABLET | ORAL | Status: DC | PRN
  Administered 2015-12-01 – 2015-12-04 (×7): 1 via ORAL
  Filled 2015-12-01 (×7): qty 1

## 2015-12-01 MED ORDER — IPRATROPIUM-ALBUTEROL 0.5-2.5 (3) MG/3ML IN SOLN
RESPIRATORY_TRACT | Status: AC
  Administered 2015-12-01: 3 mL via RESPIRATORY_TRACT
  Filled 2015-12-01: qty 3

## 2015-12-01 MED ORDER — CHLORHEXIDINE GLUCONATE 4 % EX LIQD: 1.0000 "application " | Freq: Once | CUTANEOUS | Status: DC

## 2015-12-01 MED ORDER — ONDANSETRON HCL 4 MG/2ML IJ SOLN
INTRAMUSCULAR | Status: DC | PRN
  Administered 2015-12-01: 4 mg via INTRAVENOUS

## 2015-12-01 MED ORDER — BUPIVACAINE HCL (PF) 0.5 % IJ SOLN
INTRAMUSCULAR | Status: AC
  Filled 2015-12-01: qty 30

## 2015-12-01 MED ORDER — CYCLOBENZAPRINE HCL 10 MG PO TABS
5.0000 mg | ORAL_TABLET | Freq: Three times a day (TID) | ORAL | Status: DC
  Administered 2015-12-01 – 2015-12-04 (×8): 5 mg via ORAL
  Filled 2015-12-01 (×9): qty 1

## 2015-12-01 MED ORDER — SODIUM CHLORIDE 0.9 % IV SOLN
INTRAVENOUS | Status: DC | PRN
  Administered 2015-12-01: 20 mL

## 2015-12-01 MED ORDER — HYDROCHLOROTHIAZIDE 12.5 MG PO CAPS
12.5000 mg | ORAL_CAPSULE | Freq: Every day | ORAL | Status: DC
  Administered 2015-12-03 – 2015-12-04 (×2): 12.5 mg via ORAL
  Filled 2015-12-01 (×2): qty 1

## 2015-12-01 MED ORDER — IRBESARTAN 75 MG PO TABS
300.0000 mg | ORAL_TABLET | Freq: Every day | ORAL | Status: DC
  Administered 2015-12-03 – 2015-12-04 (×2): 300 mg via ORAL
  Filled 2015-12-01 (×3): qty 4

## 2015-12-01 MED ORDER — IPRATROPIUM-ALBUTEROL 0.5-2.5 (3) MG/3ML IN SOLN
3.0000 mL | Freq: Once | RESPIRATORY_TRACT | Status: AC
  Administered 2015-12-01: 3 mL via RESPIRATORY_TRACT

## 2015-12-01 MED ORDER — FENTANYL CITRATE (PF) 100 MCG/2ML IJ SOLN
INTRAMUSCULAR | Status: AC
  Administered 2015-12-01: 25 ug via INTRAVENOUS
  Filled 2015-12-01: qty 2

## 2015-12-01 MED ORDER — FLUTICASONE PROPIONATE 50 MCG/ACT NA SUSP
2.0000 | Freq: Every day | NASAL | Status: DC
  Administered 2015-12-02 – 2015-12-04 (×3): 2 via NASAL
  Filled 2015-12-01: qty 16

## 2015-12-01 MED ORDER — HYDROCODONE-ACETAMINOPHEN 10-325 MG PO TABS: 1.0000 | ORAL_TABLET | ORAL | Status: DC | PRN

## 2015-12-01 MED ORDER — ONDANSETRON HCL 4 MG/2ML IJ SOLN: 4.0000 mg | Freq: Once | INTRAMUSCULAR | Status: DC | PRN

## 2015-12-01 MED ORDER — LACTATED RINGERS IV SOLN
INTRAVENOUS | Status: DC
  Administered 2015-12-01: 10:00:00 via INTRAVENOUS

## 2015-12-01 MED ORDER — BUPIVACAINE HCL (PF) 0.5 % IJ SOLN
INTRAMUSCULAR | Status: DC | PRN
Start: 2015-12-01 — End: 2015-12-01
  Administered 2015-12-01: 30 mL

## 2015-12-01 MED ORDER — SERTRALINE HCL 50 MG PO TABS
50.0000 mg | ORAL_TABLET | Freq: Every day | ORAL | Status: DC
  Administered 2015-12-02 – 2015-12-04 (×3): 50 mg via ORAL
  Filled 2015-12-01 (×3): qty 1

## 2015-12-01 MED ORDER — VALSARTAN-HYDROCHLOROTHIAZIDE 320-12.5 MG PO TABS: 1.0000 | ORAL_TABLET | ORAL | Status: DC

## 2015-12-01 MED ORDER — TIOTROPIUM BROMIDE MONOHYDRATE 18 MCG IN CAPS
18.0000 ug | ORAL_CAPSULE | Freq: Every day | RESPIRATORY_TRACT | Status: DC
  Administered 2015-12-02 – 2015-12-04 (×3): 18 ug via RESPIRATORY_TRACT
  Filled 2015-12-01: qty 5

## 2015-12-01 MED ORDER — IPRATROPIUM-ALBUTEROL 20-100 MCG/ACT IN AERS
INHALATION_SPRAY | RESPIRATORY_TRACT | Status: DC | PRN
  Administered 2015-12-01: 10 via RESPIRATORY_TRACT

## 2015-12-01 MED ORDER — LEVOTHYROXINE SODIUM 75 MCG PO TABS
75.0000 ug | ORAL_TABLET | Freq: Every day | ORAL | Status: DC
  Administered 2015-12-02 – 2015-12-04 (×3): 75 ug via ORAL
  Filled 2015-12-01 (×3): qty 1

## 2015-12-01 MED ORDER — ONDANSETRON 8 MG PO TBDP: 4.0000 mg | ORAL_TABLET | Freq: Four times a day (QID) | ORAL | Status: DC | PRN

## 2015-12-01 MED ORDER — MIDAZOLAM HCL 2 MG/2ML IJ SOLN
INTRAMUSCULAR | Status: DC | PRN
  Administered 2015-12-01: 1 mg via INTRAVENOUS

## 2015-12-01 MED ORDER — ASPIRIN EC 81 MG PO TBEC
81.0000 mg | DELAYED_RELEASE_TABLET | ORAL | Status: DC
  Administered 2015-12-02 – 2015-12-04 (×3): 81 mg via ORAL
  Filled 2015-12-01 (×3): qty 1

## 2015-12-01 MED ORDER — MELOXICAM 7.5 MG PO TABS: 15.0000 mg | ORAL_TABLET | ORAL | Status: DC

## 2015-12-01 MED ORDER — HYDROMORPHONE HCL 1 MG/ML IJ SOLN
INTRAMUSCULAR | Status: AC
  Administered 2015-12-01: 0.25 mg via INTRAVENOUS
  Filled 2015-12-01: qty 1

## 2015-12-01 MED ORDER — LIDOCAINE HCL (CARDIAC) 20 MG/ML IV SOLN
INTRAVENOUS | Status: DC | PRN
  Administered 2015-12-01: 60 mg via INTRAVENOUS

## 2015-12-01 MED ORDER — FENTANYL CITRATE (PF) 100 MCG/2ML IJ SOLN
INTRAMUSCULAR | Status: DC | PRN
  Administered 2015-12-01: 100 ug via INTRAVENOUS
  Administered 2015-12-01: 50 ug via INTRAVENOUS

## 2015-12-01 MED ORDER — NEOSTIGMINE METHYLSULFATE 10 MG/10ML IV SOLN
INTRAVENOUS | Status: DC | PRN
Start: 2015-12-01 — End: 2015-12-01
  Administered 2015-12-01: 4 mg via INTRAVENOUS

## 2015-12-01 MED ORDER — KETOROLAC TROMETHAMINE 15 MG/ML IJ SOLN
15.0000 mg | Freq: Four times a day (QID) | INTRAMUSCULAR | Status: DC
  Administered 2015-12-01 – 2015-12-04 (×11): 15 mg via INTRAVENOUS
  Filled 2015-12-01 (×11): qty 1

## 2015-12-01 MED ORDER — MORPHINE SULFATE (PF) 2 MG/ML IV SOLN
1.0000 mg | INTRAVENOUS | Status: DC | PRN
  Administered 2015-12-02: 1 mg via INTRAVENOUS
  Filled 2015-12-01: qty 1

## 2015-12-01 MED ORDER — METOPROLOL TARTRATE 25 MG PO TABS
75.0000 mg | ORAL_TABLET | Freq: Two times a day (BID) | ORAL | Status: DC
  Administered 2015-12-01 – 2015-12-04 (×4): 75 mg via ORAL
  Filled 2015-12-01 (×6): qty 3

## 2015-12-01 MED ORDER — SIMVASTATIN 20 MG PO TABS
20.0000 mg | ORAL_TABLET | Freq: Every day | ORAL | Status: DC
  Administered 2015-12-02 – 2015-12-03 (×2): 20 mg via ORAL
  Filled 2015-12-01 (×2): qty 1

## 2015-12-01 MED ORDER — CEFAZOLIN SODIUM-DEXTROSE 2-3 GM-% IV SOLR: 2.0000 g | INTRAVENOUS | Status: DC

## 2015-12-01 MED ORDER — PANTOPRAZOLE SODIUM 40 MG PO TBEC
40.0000 mg | DELAYED_RELEASE_TABLET | Freq: Every day | ORAL | Status: DC
  Administered 2015-12-02 – 2015-12-04 (×3): 40 mg via ORAL
  Filled 2015-12-01 (×3): qty 1

## 2015-12-01 MED ORDER — CEFOXITIN SODIUM-DEXTROSE 2-2.2 GM-% IV SOLR (PREMIX)
2.0000 g | Freq: Four times a day (QID) | INTRAVENOUS | Status: DC
  Filled 2015-12-01 (×2): qty 50

## 2015-12-01 MED ORDER — FENTANYL CITRATE (PF) 100 MCG/2ML IJ SOLN
25.0000 ug | INTRAMUSCULAR | Status: AC | PRN
  Administered 2015-12-01 (×6): 25 ug via INTRAVENOUS

## 2015-12-01 MED ORDER — ISOSORBIDE MONONITRATE ER 30 MG PO TB24
30.0000 mg | ORAL_TABLET | Freq: Every day | ORAL | Status: DC
  Administered 2015-12-04: 30 mg via ORAL
  Filled 2015-12-01 (×3): qty 1

## 2015-12-01 MED ORDER — IPRATROPIUM-ALBUTEROL 0.5-2.5 (3) MG/3ML IN SOLN
RESPIRATORY_TRACT | Status: AC
Start: 2015-12-01 — End: 2015-12-01
  Administered 2015-12-01: 3 mL via RESPIRATORY_TRACT
  Filled 2015-12-01: qty 3

## 2015-12-01 MED ORDER — GLYCOPYRROLATE 0.2 MG/ML IJ SOLN
INTRAMUSCULAR | Status: DC | PRN
  Administered 2015-12-01: 0.2 mg via INTRAVENOUS
  Administered 2015-12-01: 0.6 mg via INTRAVENOUS

## 2015-12-01 MED ORDER — ONDANSETRON HCL 4 MG/2ML IJ SOLN: 4.0000 mg | Freq: Four times a day (QID) | INTRAMUSCULAR | Status: DC | PRN

## 2015-12-01 MED ORDER — ROCURONIUM BROMIDE 100 MG/10ML IV SOLN
INTRAVENOUS | Status: DC | PRN
  Administered 2015-12-01: 5 mg via INTRAVENOUS
  Administered 2015-12-01: 40 mg via INTRAVENOUS
  Administered 2015-12-01 (×2): 5 mg via INTRAVENOUS

## 2015-12-01 MED ORDER — HEPARIN SODIUM (PORCINE) 5000 UNIT/ML IJ SOLN
5000.0000 [IU] | Freq: Three times a day (TID) | INTRAMUSCULAR | Status: DC
  Administered 2015-12-02 – 2015-12-04 (×7): 5000 [IU] via SUBCUTANEOUS
  Filled 2015-12-01 (×7): qty 1

## 2015-12-01 SURGICAL SUPPLY — 41 items
APPLIER CLIP 5 13 M/L LIGAMAX5 (MISCELLANEOUS) ×3
APR CLP MED LRG 5 ANG JAW (MISCELLANEOUS) ×1
BLADE SURG 15 STRL LF DISP TIS (BLADE) ×1 IMPLANT
BLADE SURG 15 STRL SS (BLADE) ×3
CANISTER SUCT 1200ML W/VALVE (MISCELLANEOUS) ×3 IMPLANT
CATH CHOLANGI 4FR 420404F (CATHETERS) ×3 IMPLANT
CHLORAPREP W/TINT 26ML (MISCELLANEOUS) ×3 IMPLANT
CLIP APPLIE 5 13 M/L LIGAMAX5 (MISCELLANEOUS) ×1 IMPLANT
CONRAY 60ML FOR OR (MISCELLANEOUS) ×3 IMPLANT
DEFOGGER SCOPE WARMER CLEARIFY (MISCELLANEOUS) ×3 IMPLANT
DRAPE C-ARM XRAY 36X54 (DRAPES) ×2 IMPLANT
ELECT E-Z MONOPOLAR 33 (MISCELLANEOUS) ×3
ELECT REM PT RETURN 9FT ADLT (ELECTROSURGICAL) ×3
ELECTRODE E-Z MONOPOLAR 33 (MISCELLANEOUS) ×1 IMPLANT
ELECTRODE REM PT RTRN 9FT ADLT (ELECTROSURGICAL) ×1 IMPLANT
ENDOPOUCH RETRIEVER 10 (MISCELLANEOUS) ×3 IMPLANT
GLOVE PI ORTHOPRO 6.5 (GLOVE) ×2
GLOVE PI ORTHOPRO STRL 6.5 (GLOVE) ×1 IMPLANT
GOWN STRL REUS W/ TWL LRG LVL3 (GOWN DISPOSABLE) ×3 IMPLANT
GOWN STRL REUS W/TWL LRG LVL3 (GOWN DISPOSABLE) ×9
IRRIGATION STRYKERFLOW (MISCELLANEOUS) ×1 IMPLANT
IRRIGATOR STRYKERFLOW (MISCELLANEOUS) ×3
IV CATH ANGIO 12GX3 LT BLUE (NEEDLE) ×3 IMPLANT
IV NS 1000ML (IV SOLUTION) ×3
IV NS 1000ML BAXH (IV SOLUTION) ×1 IMPLANT
LABEL OR SOLS (LABEL) ×3 IMPLANT
LIQUID BAND (GAUZE/BANDAGES/DRESSINGS) ×3 IMPLANT
NDL HYPO 25X1 1.5 SAFETY (NEEDLE) ×1 IMPLANT
NEEDLE HYPO 25X1 1.5 SAFETY (NEEDLE) ×3 IMPLANT
NS IRRIG 500ML POUR BTL (IV SOLUTION) ×3 IMPLANT
PACK LAP CHOLECYSTECTOMY (MISCELLANEOUS) ×3 IMPLANT
PENCIL ELECTRO HAND CTR (MISCELLANEOUS) ×3 IMPLANT
SCISSORS METZENBAUM CVD 33 (INSTRUMENTS) ×3 IMPLANT
SLEEVE ENDOPATH XCEL 5M (ENDOMECHANICALS) ×6 IMPLANT
SUT MNCRL 4-0 (SUTURE) ×3
SUT MNCRL 4-0 27XMFL (SUTURE) ×1
SUT VICRYL 0 AB UR-6 (SUTURE) ×6 IMPLANT
SUTURE MNCRL 4-0 27XMF (SUTURE) ×1 IMPLANT
TROCAR XCEL BLUNT TIP 100MML (ENDOMECHANICALS) ×3 IMPLANT
TROCAR XCEL NON-BLD 5MMX100MML (ENDOMECHANICALS) ×3 IMPLANT
TUBING INSUFFLATOR HI FLOW (MISCELLANEOUS) ×3 IMPLANT

## 2015-12-01 NOTE — H&P (View-Only) (Signed)
Subjective:     Patient ID: Carolyn Hendricks, female   DOB: Oct 22, 1935, 80 y.o.   MRN: [id number]  HPI  80 yr old female getting some mobility back after having gallstone pancreatitis in Dec 2016. She has multiple medical issues including CAD on plavix, HTN, SOB, Reactive airway disease, and CRF and history of MRSA infection. Patient states feeling beter, getting her energy back slowly but still with some fatigue.She does still have occasional  Epigastric pain right upper quadrant pain with certain foods especially if they're greasier. Patient states that she does still have some diarrhea stools depending on what she eats as well. Patient denies any fever chills nausea vomiting abdominal pain changes in her bowels constipation dysuria or hematuria.  Past Medical History  Diagnosis Date  . Heart murmur   . Head ache   . Chest pain   . Shortness of breath   . Dizziness   . High blood pressure   . Arthritis   . History of MRSA infection   . Chronic kidney disease   . Diastolic dysfunction    Past Surgical History  Procedure Laterality Date  . Carotid endarterectomy Left Dr. Lucky Cowboy  . Knee replacement (other) Left 2002  . Foot and ankle surgery    . Appendectomy  1950  . Cardiac catheterization N/A 06/12/2015    Procedure: Left Heart Cath and Coronary Angiography;  Surgeon: Minna Merritts, MD;  Location: [facility];  Service: Cardiovascular;  Laterality: N/A;   Family History  Problem Relation Age of Onset  . Congestive Heart Failure Mother 42  . Cancer Brother   . Kidney failure Father    Social History   Social History  . Marital Status: Widowed    Spouse Name: N/A  . Number of Children: N/A  . Years of Education: 12   Occupational History  . retired    Social History Main Topics  . Smoking status: Former Smoker -- 1.00 packs/day for 26 years    Types: Cigarettes    Quit date: 10/11/2011  . Smokeless tobacco: None     Comment: Patient has smoked for 26 years;  has quit but now started back 0.25 ppw.  . Alcohol Use: No  . Drug Use: No  . Sexual Activity: Not Asked   Other Topics Concern  . None   Social History Narrative    Current outpatient prescriptions:  .  Aclidinium Bromide (TUDORZA PRESSAIR) 400 MCG/ACT AEPB, Inhale 1 puff into the lungs 2 (two) times daily., Disp: 2 each, Rfl: 0 .  aspirin 81 MG tablet, Take 81 mg by mouth daily., Disp: , Rfl:  .  cetirizine (ZYRTEC) 10 MG tablet, Take 10 mg by mouth daily., Disp: , Rfl:  .  clopidogrel (PLAVIX) 75 MG tablet, Take 75 mg by mouth daily., Disp: , Rfl:  .  Cyanocobalamin (VITAMIN B-12 PO), Take by mouth daily., Disp: , Rfl:  .  diltiazem (CARDIZEM) 120 MG tablet, Take 1 tablet by mouth daily., Disp: , Rfl:  .  fluticasone (FLONASE) 50 MCG/ACT nasal spray, INHALE ONE TO TWO SPRAY IN EACH NOSTRIL DAILY AS NEEDED, Disp: 16 g, Rfl: 6 .  Fluticasone-Salmeterol (ADVAIR DISKUS) 250-50 MCG/DOSE AEPB, Inhale 1 puff into the lungs every 12 (twelve) hours., Disp: 60 each, Rfl: 12 .  furosemide (LASIX) 20 MG tablet, Take 1 tablet by mouth daily., Disp: , Rfl:  .  HYDROcodone-acetaminophen (NORCO) 10-325 MG tablet, Take 1 tablet by mouth every 4 (four) hours as needed.,  Disp: 180 tablet, Rfl: 0 .  isosorbide mononitrate (IMDUR) 30 MG 24 hr tablet, Take 1 tablet (30 mg total) by mouth daily., Disp: 30 tablet, Rfl: 11 .  levocetirizine (XYZAL) 5 MG tablet, Take 5 mg by mouth every evening., Disp: , Rfl:  .  levothyroxine (SYNTHROID, LEVOTHROID) 75 MCG tablet, TAKE 1 TABLET BY MOUTH DAILY, Disp: 30 tablet, Rfl: 4 .  LORazepam (ATIVAN) 0.5 MG tablet, Take 0.5 mg by mouth every 8 (eight) hours., Disp: , Rfl:  .  meloxicam (MOBIC) 15 MG tablet, Take 1 tablet by mouth daily., Disp: , Rfl:  .  metoprolol (LOPRESSOR) 50 MG tablet, Take 1.5 tablets (75 mg total) by mouth 2 (two) times daily., Disp: 90 tablet, Rfl: 0 .  Metoprolol Tartrate 75 MG TABS, Take 75 mg by mouth 2 (two) times daily., Disp: , Rfl:  .   Multiple Vitamin (MULTI-VITAMIN DAILY PO), Take by mouth., Disp: , Rfl:  .  mupirocin ointment (BACTROBAN) 2 %, Apply 1 application topically 3 (three) times daily. , Disp: , Rfl:  .  nitroGLYCERIN (NITROSTAT) 0.4 MG SL tablet, Place 1 tablet (0.4 mg total) under the tongue every 5 (five) minutes as needed for chest pain., Disp: 25 tablet, Rfl: 3 .  pantoprazole (PROTONIX) 40 MG tablet, Take 1 tablet (40 mg total) by mouth daily., Disp: 30 tablet, Rfl: 0 .  sertraline (ZOLOFT) 50 MG tablet, 0.5 to 1 tablet daily, Disp: 30 tablet, Rfl: 6 .  simvastatin (ZOCOR) 20 MG tablet, Take 1 tablet (20 mg total) by mouth daily at 6 PM., Disp: 90 tablet, Rfl: 3 .  traZODone (DESYREL) 100 MG tablet, 1/2-1 tablet at bedtime as needed, Disp: 30 tablet, Rfl: 5 .  valsartan-hydrochlorothiazide (DIOVAN-HCT) 320-12.5 MG per tablet, Take 1 tablet by mouth daily., Disp: , Rfl:  Allergies  Allergen Reactions  . Atorvastatin     Other reaction(s): Muscle Pain  . Crestor  [Rosuvastatin Calcium]     Other reaction(s): Muscle Pain  . Pravastatin Sodium     Muscle pain     Review of Systems  Constitutional: Negative for fever, activity change, appetite change and fatigue.  HENT: Negative for congestion and sore throat.   Respiratory: Negative for cough, chest tightness and shortness of breath.   Cardiovascular: Negative for chest pain, palpitations and leg swelling.  Gastrointestinal: Negative for vomiting, abdominal pain, constipation, blood in stool and abdominal distention.  Genitourinary: Negative for urgency and hematuria.  Musculoskeletal: Negative for myalgias, back pain and gait problem.  Skin: Negative for color change, pallor, rash and wound.  Neurological: Negative for dizziness and weakness.  Hematological: Negative for adenopathy. Does not bruise/bleed easily.  Psychiatric/Behavioral: Negative for agitation. The patient is not nervous/anxious.   All other systems reviewed and are negative.  Filed  Vitals:   11/18/15 1109  BP: 121/71  Pulse: 61  Temp: 97.3 F (36.3 C)       Objective:   Physical Exam  Constitutional: She is oriented to person, place, and time. She appears well-developed and well-nourished. No distress.  Cardiovascular: Normal rate, regular rhythm, normal heart sounds and intact distal pulses.  Exam reveals no gallop and no friction rub.   No murmur heard. Pulmonary/Chest: Effort normal and breath sounds normal. No respiratory distress. She has no wheezes. She has no rales.  Abdominal: Soft. Bowel sounds are normal. She exhibits no distension. There is no tenderness. There is no rebound.  Neurological: She is alert and oriented to person, place, and time. No  cranial nerve deficit.  Skin: Skin is warm and dry. No rash noted. No erythema. No pallor.  Psychiatric: She has a normal mood and affect. Her behavior is normal. Judgment and thought content normal.  Vitals reviewed.  CBC Latest Ref Rng 09/30/2015 09/29/2015 09/28/2015  WBC 3.6 - 11.0 K/uL 9.6 8.1 7.5  Hemoglobin 12.0 - 16.0 g/dL 11.9(L) 11.3(L) 12.6  Hematocrit 35.0 - 47.0 % 38.7 36.1 39.8  Platelets 150 - 440 K/uL 154 167 193    CMP Latest Ref Rng 10/02/2015 10/01/2015 09/30/2015  Glucose 65 - 99 mg/dL 142(H) 95 64(L)  BUN 6 - 20 mg/dL 35(H) 33(H) 29(H)  Creatinine 0.44 - 1.00 mg/dL 1.17(H) 1.45(H) 1.33(H)  Sodium 135 - 145 mmol/L 141 140 140  Potassium 3.5 - 5.1 mmol/L 4.1 3.9 4.3  Chloride 101 - 111 mmol/L 108 106 111  CO2 22 - 32 mmol/L 26 23 21(L)  Calcium 8.9 - 10.3 mg/dL 9.1 8.6(L) 9.1  Total Protein 6.5 - 8.1 g/dL - 6.7 6.5  Total Bilirubin 0.3 - 1.2 mg/dL - 0.9 1.0  Alkaline Phos 38 - 126 U/L - 117 134(H)  AST 15 - 41 U/L - 45(H) 73(H)  ALT 14 - 54 U/L - 104(H) 144(H)        Assessment:    80 yr old female s/p gallstone pancreatitis    Plan:     She has obtained cardiology clearance and is ok to be off plavix 5 days prior to OR.  The risks, benefits, complications, treatment  options, and expected outcomes were discussed with the patient. The possibilities of bleeding, recurrent infection, finding a normal gallbladder, perforation of viscus organs, damage to surrounding structures, bile leak, abscess formation, needing a drain placed, the need for additional procedures, reaction to medication, pulmonary aspiration,  failure to diagnose a condition, the possible need to convert to an open procedure, and creating a complication requiring transfusion or operation were discussed with the patient. The patient and/or family concurred with the proposed plan, giving informed consent.  I will set her up for Feb 21st, and to hold plavix 5 days prior.  If stones are in the CBD she will have to be admitted but otherwise can do as outpatient procedure. Patient and family agree

## 2015-12-01 NOTE — Anesthesia Procedure Notes (Signed)
Procedure Name: Intubation Date/Time: 12/01/2015 10:11 AM Performed by: Lance Muss Pre-anesthesia Checklist: Patient identified, Emergency Drugs available, Suction available and Patient being monitored Patient Re-evaluated:Patient Re-evaluated prior to inductionOxygen Delivery Method: Circle system utilized Preoxygenation: Pre-oxygenation with 100% oxygen Intubation Type: IV induction Ventilation: Mask ventilation without difficulty and Oral airway inserted - appropriate to patient size Laryngoscope Size: Mac and 3 Grade View: Grade I Tube type: Oral Tube size: 7.0 mm Number of attempts: 1 Placement Confirmation: ETT inserted through vocal cords under direct vision,  positive ETCO2 and breath sounds checked- equal and bilateral Secured at: 21 cm Tube secured with: Tape Dental Injury: Teeth and Oropharynx as per pre-operative assessment

## 2015-12-01 NOTE — Interval H&P Note (Signed)
History and Physical Interval Note:  12/01/2015 9:51 AM  Carolyn Hendricks  has presented today for surgery, with the diagnosis of Gallstone pancreatitis  The various methods of treatment have been discussed with the patient and family. After consideration of risks, benefits and other options for treatment, the patient has consented to  Procedure(s): LAPAROSCOPIC CHOLECYSTECTOMY WITH INTRAOPERATIVE CHOLANGIOGRAM (N/A) as a surgical intervention .  The patient's history has been reviewed, patient examined, no change in status, stable for surgery.  I have reviewed the patient's chart and labs.  Questions were answered to the patient's satisfaction.     Hyun Reali L Caelen Higinbotham

## 2015-12-01 NOTE — Op Note (Signed)
Laparoscopic Cholecystectomy with IOC Procedure Note  Indications: This patient presents with symptomatic gallbladder disease and will undergo laparoscopic cholecystectomy.  Pre-operative Diagnosis: Chronic Cholecystitis, Gallstone pancreatitis history  Post-operative Diagnosis: Same  Surgeon: Hubbard Robinson   Assistants: none  Anesthesia: General endotracheal anesthesia  ASA Class: 3  Procedure Details  The patient was seen again in the Holding Room. The risks, benefits, complications, treatment options, and expected outcomes were discussed with the patient. The possibilities of reaction to medication, pulmonary aspiration, perforation of viscus, bleeding, recurrent infection, finding a normal gallbladder, the need for additional procedures, failure to diagnose a condition, the possible need to convert to an open procedure, and creating a complication requiring transfusion or operation were discussed with the patient. The patient and/or family concurred with the proposed plan, giving informed consent. The patient was taken to Operating Room, identified as Carolyn Hendricks and the procedure verified as Laparoscopic Cholecystectomy with Intraoperative Cholangiograms. A Time Out was held and the above information confirmed.  Prior to the induction of general anesthesia, antibiotic prophylaxis was administered. General endotracheal anesthesia was then administered and tolerated well. After the induction, the abdomen was prepped in the usual sterile fashion. The patient was positioned in the supine position with the left arm comfortably tucked, along with some reverse Trendelenburg.  Local anesthetic agent was injected into the skin near the umbilicus and an incision made. Patient had an umbilical hernia and the trocar was placed through this defect, after the hernia site was resected with blunt and sharp dissection. the Hasson technique was used to introduce a 10 mm port under direct vision. It  was secured with a figure of eight Vicryl suture placed in the usual fashion. Pneumoperitoneum was then created with CO2 and tolerated well without any adverse changes in the patient's vital signs. Additional trocars were introduced under direct vision. All skin incisions were infiltrated with a local anesthetic agent before making the incision and placing the trocars.   The abdomen was inspected and some nodularity was seen on the liver as well as a small peritoneal white lesion on the anterior diaphragm. A grasper was used to take the nodule off this was sent to pathology as a peritoneal nodule biopsy. Pictures were taken of the gallbladder nodule as well as the peritoneal lesion. The gallbladder was identified, the fundus grasped and retracted cephalad. Adhesions were lysed bluntly and with the electrocautery where indicated, taking care not to injure any adjacent organs or viscus. The infundibulum was grasped and retracted laterally, exposing the peritoneum overlying the triangle of Calot. This was then divided and exposed in a blunt fashion. The cystic duct was clearly identified and bluntly dissected circumferentially. The junctions of the gallbladder, cystic duct and common bile duct were clearly identified prior to the division of any linear structure.  An incision was made in the cystic duct and the cholangiogram catheter introduced. The catheter was secured using an endoclip. The study showed no stones and good visualization of the distal and proximal biliary tree. The catheter was then removed.   The cystic duct was then doubly ligated with surgical clips on the patient side and singly clipped on the gallbladder side and divided. The cystic artery was identified, dissected free, ligated with clips and divided as well.   The gallbladder was dissected from the liver bed in retrograde fashion with the electrocautery. The gallbladder was removed. The liver bed was irrigated and inspected. Hemostasis  was achieved with the electrocautery. Copious irrigation was utilized and  was repeatedly aspirated until clear all particulate matter.  Pneumoperitoneum was completely reduced after viewing removal of the trocars under direct vision. The wound was thoroughly irrigated and the fascia was then closed with a figure of eight suture.  the umbilical hernia was repaired using this figure-of-eight suture and the umbilical dermis was tacked down to the fascia. The skin was then closed with 4-0 Monocryl and a sterile glue was applied.  Instrument, sponge, and needle counts were correct at closure and at the conclusion of the case.   Findings: Cholecystitis with Cholelithiasis  Estimated Blood Loss: less than 50 mL         Drains: none         Total IV Fluids: 1057m         Specimens: Gallbladder           Complications: None; patient tolerated the procedure well.         Disposition: PACU - hemodynamically stable.         Condition: stable

## 2015-12-01 NOTE — Anesthesia Postprocedure Evaluation (Signed)
Anesthesia Post Note  Patient: DOSIA YODICE  Procedure(s) Performed: Procedure(s) (LRB): LAPAROSCOPIC CHOLECYSTECTOMY WITH INTRAOPERATIVE CHOLANGIOGRAM (N/A)  Patient location during evaluation: PACU Anesthesia Type: General Level of consciousness: awake and alert Pain management: pain level controlled Vital Signs Assessment: post-procedure vital signs reviewed and stable Respiratory status: spontaneous breathing, nonlabored ventilation, respiratory function stable and patient connected to nasal cannula oxygen Cardiovascular status: blood pressure returned to baseline and stable Postop Assessment: no signs of nausea or vomiting Anesthetic complications: no    Last Vitals:  Filed Vitals:   12/01/15 1301 12/01/15 1316  BP: 117/68 122/58  Pulse: 69 56  Temp:    Resp: 12 13    Last Pain:  Filed Vitals:   12/01/15 1318  PainSc: 10-Worst pain ever                 Molli Barrows

## 2015-12-01 NOTE — Anesthesia Preprocedure Evaluation (Addendum)
Anesthesia Evaluation  Patient identified by MRN, date of birth, ID band Patient awake    Reviewed: Allergy & Precautions, H&P , NPO status , Patient's Chart, lab work & pertinent test results, reviewed documented beta blocker date and time   Airway Mallampati: II   Neck ROM: full    Dental  (+) Poor Dentition   Pulmonary neg pulmonary ROS, shortness of breath, asthma , COPD,  COPD inhaler, former smoker,    Pulmonary exam normal        Cardiovascular hypertension, + angina with exertion + Peripheral Vascular Disease  negative cardio ROS Normal cardiovascular exam+ Valvular Problems/Murmurs      Neuro/Psych negative neurological ROS  negative psych ROS   GI/Hepatic negative GI ROS, Neg liver ROS,   Endo/Other  negative endocrine ROS  Renal/GU negative Renal ROS  negative genitourinary   Musculoskeletal   Abdominal   Peds  Hematology negative hematology ROS (+)   Anesthesia Other Findings Past Medical History:   Heart murmur                                                 Head ache                                                    Chest pain                                                   Shortness of breath                                          Dizziness                                                    High blood pressure                                          Arthritis                                                    History of MRSA infection                                    Diastolic dysfunction                                        Chronic kidney disease  Past Surgical History:   CAROTID ENDARTERECTOMY                          Left Dr. Lucky Cowboy      knee replacement (other)                        Left 2002         foot and ankle surgery                                        APPENDECTOMY                                     1950         CARDIAC CATHETERIZATION                          N/A 06/12/2015       Comment:Procedure: Left Heart Cath and Coronary               Angiography;  Surgeon: Minna Merritts, MD;                Location: [facility];  Service:               Cardiovascular;  Laterality: N/A;   JOINT REPLACEMENT                                             FRACTURE SURGERY                                             Patient Information   Patient Name Sex DOB SSN  Carolyn Hendricks, Carolyn Hendricks Female 1936-09-13 [id number]  Progress Notes by Rise Mu, PA-C at 10/02/2015 1:38 PM   Author: Rise Mu, PA-C Service: Cardiology Author Type: Physician Assistant Certified  Filed: 33/61/[id number] 1:43 PM Note Time: 10/02/2015 1:38 PM Status: Signed  Editor: Rise Mu, PA-C (Physician Assistant Certified) Cosigner: Wellington Hampshire, MD at 10/02/2015 6:23 PM  Expand All Collapse All       Patient: Carolyn Hendricks / Admit Date: 09/28/2015 / Date of Encounter: 10/02/2015, 1:38 PM   Subjective: She is for discharge today. No further tachycardic episodes.   Review of Systems: Review of Systems  Constitutional: Positive for malaise/fatigue. Negative for fever, chills, weight loss and diaphoresis.  HENT: Negative for congestion.  Eyes: Negative for discharge and redness.  Respiratory: Negative for cough, sputum production, shortness of breath and wheezing.  Cardiovascular: Negative for chest pain, palpitations, orthopnea, claudication, leg swelling and PND.  Gastrointestinal: Positive for abdominal pain. Negative for nausea and vomiting.  Skin: Negative for rash.  Neurological: Positive for weakness. Negative for sensory change, speech change, focal weakness and loss of consciousness.  Endo/Heme/Allergies: Does not bruise/bleed easily.  Psychiatric/Behavioral: Negative for hallucinations and substance abuse. The patient is not nervous/anxious.     Objective: Telemetry: NSR, 80's, no further tachycardic episodes Physical Exam: Blood  pressure 152/83, pulse  76, temperature 97.6 F (36.4 C), temperature source Oral, resp. rate 18, height 5' 2" (1.575 m), weight 183 lb 14.4 oz (83.416 kg), SpO2 90 %. Body mass index is 33.63 kg/(m^2). General: Well developed, well nourished, in no acute distress. Head: Normocephalic, atraumatic, sclera non-icteric, no xanthomas, nares are without discharge. Neck: Negative for carotid bruits. JVP not elevated. Lungs: Clear bilaterally to auscultation without wheezes, rales, or rhonchi. Breathing is unlabored. Heart: RRR S1 S2 without murmurs, rubs, or gallops.  Abdomen: Soft, non-tender, non-distended with normoactive bowel sounds. No rebound/guarding. Extremities: No clubbing or cyanosis. No edema. Distal pedal pulses are 2+ and equal bilaterally. Neuro: Alert and oriented X 3. Moves all extremities spontaneously. Psych: Responds to questions appropriately with a normal affect.   Intake/Output Summary (Last 24 hours) at 10/02/15 1338 Last data filed at 10/02/15 1300   Gross per 24 hour Intake  341 ml Output  775 ml Net  -434 ml   Inpatient Medications:   . cefTRIAXone (ROCEPHIN) IV 1 g Intravenous Q24H . fluticasone 1 spray Each Nare Daily . heparin 5,000 Units Subcutaneous 3 times per day . levothyroxine 75 mcg Oral QAC breakfast . loratadine 10 mg Oral Daily . LORazepam 0.5 mg Oral 3 times per day . metoprolol tartrate 50 mg Oral BID . pantoprazole 40 mg Oral Daily . sertraline 50 mg Oral Daily  Infusions:    Labs:   Recent Labs (last 2 labs)     Recent Labs   09/30/15 0510 10/01/15 0501 10/02/15 0506 NA 140 140 141 K 4.3 3.9 4.1 CL 111 106 108 CO2 21* 23 26 GLUCOSE 64* 95 142* BUN 29* 33* 35* CREATININE 1.33* 1.45* 1.17* CALCIUM 9.1 8.6* 9.1 MG 1.8 --  --      Recent Labs (last 2 labs)     Recent  Labs   09/30/15 0510 10/01/15 0501 AST 73* 45* ALT 144* 104* ALKPHOS 134* 117 BILITOT 1.0 0.9 PROT 6.5 6.7 ALBUMIN 3.6 3.3*     Recent Labs (last 2 labs)     Recent Labs   09/30/15 0510 WBC 9.6 HGB 11.9* HCT 38.7 MCV 90.0 PLT 154     Recent Labs (last 2 labs)    No results for input(s): CKTOTAL, CKMB, TROPONINI in the last 72 hours.    Recent Labs (last 2 labs)    Invalid input(s): POCBNP    Recent Labs (last 2 labs)    No results for input(s): HGBA1C in the last 72 hours.    Weights:  Filed Weights  09/28/15 1541 09/28/15 2140 10/01/15 2247 Weight: 185 lb (83.915 kg) 185 lb (83.915 kg) 183 lb 14.4 oz (83.416 kg)    Radiology/Studies:    Imaging Results   US Abdomen Limited Ruq  09/28/2015 CLINICAL DATA: Right upper quadrant pain for 5 days EXAM: US ABDOMEN LIMITED - RIGHT UPPER QUADRANT COMPARISON: None. FINDINGS: Gallbladder: There appears to be a wall echo Hendricks complex consistent with numerous gallstones. There is no Murphy's sign. The gallbladder wall does not appear to be thickened. Common bile duct: Diameter: 9 mm Liver: No focal lesion identified. Within normal limits in parenchymal echogenicity. IMPRESSION: Abundant cholelithiasis. No Murphy sign. Common bile duct dilatation. Distal obstructing process including possible distal stone not excluded. Electronically Signed By: Skipper Cliche M.D. On: 09/28/2015 19:37     Assessment and Plan  1. Paroxysmal atrial tachycardia: -No further tachycardic episodes -Her acute illness/pain made predispose her to having increased ectopy burden -Currently in sinus rhythm with heart rate in  the 80's -Continue metoprolol 50 mg bid with an extra 25 mg prn tachycardia -Monitor while inpatient  -Asymptomatic  -Replete Mg++ to 2.0  2. Acute pancreatitis: -Improved -Per IM  3. Acute on CKD stage II: -Monitor  -Per IM  4. PAD: -Outpatient follow  up  5. Diastolic dysfunction: -She does not appear volume overloaded at this time -Metoprolol as above  6. HLD: -Simvastatin on hold given her elevated LFTs   Signed, Christell Faith, PA-C Pager: [phone] 10/02/2015, 1:38 PM         Reproductive/Obstetrics negative OB ROS                            Anesthesia Physical Anesthesia Plan  ASA: III  Anesthesia Plan: General   Post-op Pain Management:    Induction:   Airway Management Planned:   Additional Equipment:   Intra-op Plan:   Post-operative Plan:   Informed Consent: I have reviewed the patients History and Physical, chart, labs and discussed the procedure including the risks, benefits and alternatives for the proposed anesthesia with the patient or authorized representative who has indicated his/her understanding and acceptance.   Dental Advisory Given  Plan Discussed with: CRNA  Anesthesia Plan Comments:         Anesthesia Quick Evaluation

## 2015-12-01 NOTE — Transfer of Care (Signed)
Immediate Anesthesia Transfer of Care Note  Patient: Carolyn Hendricks  Procedure(s) Performed: Procedure(s): LAPAROSCOPIC CHOLECYSTECTOMY WITH INTRAOPERATIVE CHOLANGIOGRAM (N/A)  Patient Location: PACU  Anesthesia Type:General  Level of Consciousness: responds to stimulation  Airway & Oxygen Therapy: Patient Spontanous Breathing and Patient connected to face mask oxygen  Post-op Assessment: Report given to RN and Post -op Vital signs reviewed and stable  Post vital signs: Reviewed and stable  Last Vitals:  Filed Vitals:   12/01/15 0851 12/01/15 1216  BP: 149/84 132/66  Pulse: 73 64  Temp: 36.9 C 36.2 C  Resp: 16 21    Complications: No apparent anesthesia complications

## 2015-12-01 NOTE — Progress Notes (Signed)
Patient desating to the High 80s when off the nasal canula.  Will admit overnight to help with atelectasis.

## 2015-12-02 ENCOUNTER — Encounter: Payer: Self-pay | Admitting: Surgery

## 2015-12-02 DIAGNOSIS — Z87891 Personal history of nicotine dependence: Secondary | ICD-10-CM | POA: Diagnosis not present

## 2015-12-02 DIAGNOSIS — I129 Hypertensive chronic kidney disease with stage 1 through stage 4 chronic kidney disease, or unspecified chronic kidney disease: Secondary | ICD-10-CM | POA: Diagnosis not present

## 2015-12-02 DIAGNOSIS — I2510 Atherosclerotic heart disease of native coronary artery without angina pectoris: Secondary | ICD-10-CM | POA: Diagnosis not present

## 2015-12-02 DIAGNOSIS — N189 Chronic kidney disease, unspecified: Secondary | ICD-10-CM | POA: Diagnosis not present

## 2015-12-02 DIAGNOSIS — K8010 Calculus of gallbladder with chronic cholecystitis without obstruction: Secondary | ICD-10-CM | POA: Diagnosis not present

## 2015-12-02 DIAGNOSIS — J45909 Unspecified asthma, uncomplicated: Secondary | ICD-10-CM | POA: Diagnosis not present

## 2015-12-02 LAB — SURGICAL PATHOLOGY

## 2015-12-02 MED ORDER — HYDROCODONE-ACETAMINOPHEN 10-325 MG PO TABS: 1.0000 | ORAL_TABLET | ORAL | Status: DC | PRN

## 2015-12-02 NOTE — Care Management (Addendum)
Had elective outpatient lap chole performed 2/21 and experienced some hypoxia post op.  Placed in observation over night.  discussed during progression of need to assess for need of home 02 and to ambualte patient.  Patient does have prior history of diastolic dysfunction. She denies that she has chronic home 02.

## 2015-12-02 NOTE — Progress Notes (Signed)
Initial Nutrition Assessment     INTERVENTION:  Meals and snacks: Cater to pt preferences   NUTRITION DIAGNOSIS:   Inadequate oral intake related to altered GI function as evidenced by  (on clear liquid diet).    GOAL:   Patient will meet greater than or equal to 90% of their needs    MONITOR:    (Energy intake, Digestive system)  REASON FOR ASSESSMENT:   Diagnosis    ASSESSMENT:      Pt s/p lap cholecystectomy yesterday, had hypoxia post op  Past Medical History  Diagnosis Date  . Heart murmur   . Head ache   . Chest pain   . Shortness of breath   . Dizziness   . High blood pressure   . Arthritis   . History of MRSA infection   . Diastolic dysfunction   . Chronic kidney disease     Current Nutrition: tolerating liquids  Food/Nutrition-Related History: pt reports no trouble with appetite.     Scheduled Medications:  . aspirin EC  81 mg Oral BH-q7a  . clopidogrel  75 mg Oral BH-q7a  . cyclobenzaprine  5 mg Oral TID  . diltiazem  120 mg Oral BH-q7a  . fluticasone  2 spray Each Nare Daily  . furosemide  20 mg Oral BID  . heparin  5,000 Units Subcutaneous 3 times per day  . irbesartan  300 mg Oral Daily   And  . hydrochlorothiazide  12.5 mg Oral Daily  . isosorbide mononitrate  30 mg Oral Daily  . ketorolac  15 mg Intravenous 4 times per day  . levothyroxine  75 mcg Oral QAC breakfast  . metoprolol tartrate  75 mg Oral BID  . mometasone-formoterol  2 puff Inhalation BID  . mupirocin ointment  1 application Topical TID  . pantoprazole  40 mg Oral Daily  . sertraline  50 mg Oral Daily  . simvastatin  20 mg Oral q1800  . tiotropium  18 mcg Inhalation Daily       Electrolyte/Renal Profile and Glucose Profile:   Recent Labs Lab 12/01/15 0919  NA 141  K 4.8  GLUCOSE 99    Gastrointestinal Profile: Last BM: 2/21      Weight Change: stable wt per pt    Diet Order:  Diet - low sodium heart healthy Diet clear liquid Room service  appropriate?: Yes  Skin:   reviewed   Height:   Ht Readings from Last 1 Encounters:  12/01/15 5' 3" (1.6 m)    Weight:   Wt Readings from Last 1 Encounters:  12/01/15 175 lb (79.379 kg)    Ideal Body Weight:     BMI:  Body mass index is 31.01 kg/(m^2).  EDUCATION NEEDS:   No education needs identified at this time  LOW Care Level  Vikas Wegmann B. Zenia Resides, [place], [place] (pager) Weekend/On-Call pager [phone])

## 2015-12-02 NOTE — Progress Notes (Signed)
Pt ambulated around room and sat in the chair after lunch. O2 was weaned to 1 L stating 96, will continue to wean off of O2. RN has educated pt on usage of flutter valve and incentive spirometry, pt demonstrated teach back. Will keep encouraging pt.   Carolyn Hendricks

## 2015-12-02 NOTE — Progress Notes (Signed)
Dr. Burt Knack notified concerning pt.'s BP 110/66 pulse 63 due to administration of Irbesartan linked with Hydrochlorothiazide, Imdur, and  Lopressor. Dr. Burt Knack stated he will be rounding shortly, RN placed medication on hold at this time. Will continue to monitor pt.   Carolyn Hendricks

## 2015-12-02 NOTE — Care Management Obs Status (Signed)
[facility] NOTIFICATION   Patient Details  Name: Carolyn Hendricks MRN: [id number] Date of Birth: [birth year]   Medicare Observation Status Notification Given:  Yes   outpatient procedure with need for observation of [place], Eastin Swing R, RN 12/02/2015, 8:49 AM

## 2015-12-02 NOTE — Progress Notes (Signed)
1 Day Post-Op  Subjective: Patient with good saturation levels on 1 L supplemental oxygen Has considerable pain however and is tolerating a clear liquid diet short of breath  Objective: Vital signs in last 24 hours: Temp:  [97.5 F (36.4 C)-98.8 F (37.1 C)] 98.4 F (36.9 C) (02/22 1228) Pulse Rate:  [52-76] 65 (02/22 1228) Resp:  [6-21] 16 (02/22 1228) BP: (74-139)/(44-107) 112/70 mmHg (02/22 1239) SpO2:  [87 %-100 %] 96 % (02/22 1228) Weight:  [175 lb (79.379 kg)] 175 lb (79.379 kg) (02/21 1709) Last BM Date: 12/01/15  Intake/Output from previous day: 02/21 0701 - 02/22 0700 In: 750 [I.V.:750] Out: 450 [Urine:425; Blood:25] Intake/Output this shift: Total I/O In: -  Out: 200 [Urine:200]  Physical exam:  She does not appear to be short of breath chest clear to auscultation abdomen is soft nondistended nontender with clean wounds and nontender calves  Lab Results: CBC   Recent Labs  12/01/15 0919  HGB 13.3  HCT 39.0   BMET  Recent Labs  12/01/15 0919  NA 141  K 4.8  GLUCOSE 99   PT/INR No results for input(s): LABPROT, INR in the last 72 hours. ABG No results for input(s): PHART, HCO3 in the last 72 hours.  Invalid input(s): PCO2, PO2  Studies/Results: Dg Cholangiogram Operative  12/01/2015  CLINICAL DATA:  Cholelithiasis EXAM: INTRAOPERATIVE CHOLANGIOGRAM TECHNIQUE: Cholangiographic images from the C-arm fluoroscopic device were submitted for interpretation post-operatively. Please see the procedural report for the amount of contrast and the fluoroscopy time utilized. COMPARISON:  None. FINDINGS: A single spot film was obtained and reveals opacification of the intrahepatic and a portion of the extrahepatic biliary tree. The distal common bile duct is not visualized on this exam. Correlation with the operative findings is recommended. 24 seconds of fluoroscopy was utilized during this exam. IMPRESSION: Distal common bile duct is not visualized on this study.  The visualized intrahepatic and proximal extrahepatic biliary tree shows no filling defect. Electronically Signed   By: Inez Catalina M.D.   On: 12/01/2015 11:56    Anti-infectives: Anti-infectives    Start     Dose/Rate Route Frequency Ordered Stop   12/01/15 1200  cefOXItin (MEFOXIN) 2 g in dextrose 50 mL IVPB (premix)  Status:  Discontinued     2 g 100 mL/hr over 30 Minutes Intravenous 4 times per day 12/01/15 0951 12/01/15 1707   12/01/15 0958  cefOXItin (MEFOXIN) 2-2.2 GM-% IVPB    Comments:  Rivka Spring: cabinet override      12/01/15 0958 12/01/15 2159   12/01/15 0657  ceFAZolin (ANCEF) 2-3 GM-% IVPB SOLR    Comments:  Jola Baptist: cabinet override      12/01/15 0657 12/01/15 1859   12/01/15 0648  ceFAZolin (ANCEF) IVPB 2 g/50 mL premix  Status:  Discontinued     2 g 100 mL/hr over 30 Minutes Intravenous On call to O.R. 12/01/15 5868 12/01/15 0951      Assessment/Plan: s/p Procedure(s): LAPAROSCOPIC CHOLECYSTECTOMY WITH INTRAOPERATIVE CHOLANGIOGRAM   She lives alone and is still requiring supplemental oxygen will advance diet and discharge tomorrow in all likelihood  Florene Glen, MD, FACS  12/02/2015

## 2015-12-03 ENCOUNTER — Observation Stay: Payer: Medicare Other

## 2015-12-03 DIAGNOSIS — I2510 Atherosclerotic heart disease of native coronary artery without angina pectoris: Secondary | ICD-10-CM | POA: Diagnosis not present

## 2015-12-03 DIAGNOSIS — K8010 Calculus of gallbladder with chronic cholecystitis without obstruction: Secondary | ICD-10-CM | POA: Diagnosis not present

## 2015-12-03 DIAGNOSIS — J45909 Unspecified asthma, uncomplicated: Secondary | ICD-10-CM | POA: Diagnosis not present

## 2015-12-03 DIAGNOSIS — N189 Chronic kidney disease, unspecified: Secondary | ICD-10-CM | POA: Diagnosis not present

## 2015-12-03 DIAGNOSIS — R0602 Shortness of breath: Secondary | ICD-10-CM | POA: Diagnosis not present

## 2015-12-03 DIAGNOSIS — I129 Hypertensive chronic kidney disease with stage 1 through stage 4 chronic kidney disease, or unspecified chronic kidney disease: Secondary | ICD-10-CM | POA: Diagnosis not present

## 2015-12-03 DIAGNOSIS — Z87891 Personal history of nicotine dependence: Secondary | ICD-10-CM | POA: Diagnosis not present

## 2015-12-03 NOTE — Evaluation (Signed)
Physical Therapy Evaluation Patient Details Name: Carolyn Hendricks MRN: [id number] DOB: 06/26/1936 Today's Date: 12/03/2015   History of Present Illness  HPI 80 yr old female getting some mobility back after having gallstone pancreatitis in Dec 2016. She has multiple medical issues including CAD on plavix, HTN, SOB, Reactive airway disease, and CRF and history of MRSA infection.  Clinical Impression  Patient is 80 yr old female with decreased strength BLE 3/5 and decreased static and dynamic standing balance fair, decreased ambulation with quad cane and 2 L O2 40 feet with CGA. Patient will benefit from skilled PT to improve strength, balance and mobility and return home.    Follow Up Recommendations Home health PT    Equipment Recommendations       Recommendations for Other Services       Precautions / Restrictions Precautions Precautions: Fall Restrictions Weight Bearing Restrictions: No      Mobility  Bed Mobility Overal bed mobility: Modified Independent                Transfers Overall transfer level: Modified independent Equipment used: Quad cane                Ambulation/Gait Ambulation/Gait assistance: Modified independent (Device/Increase time) Ambulation Distance (Feet): 40 Feet Assistive device: Quad cane Gait Pattern/deviations: Step-to pattern   Gait velocity interpretation: <1.8 ft/sec, indicative of risk for recurrent falls    Stairs            Wheelchair Mobility    Modified Rankin (Stroke Patients Only)       Balance                                             Pertinent Vitals/Pain Pain Assessment: No/denies pain    Home Living Family/patient expects to be discharged to:: Private residence Living Arrangements: Children Available Help at Discharge: Family Type of Home: House Home Access: Stairs to enter Entrance Stairs-Rails: None Technical brewer of Steps: 1 Home Layout: One level Home  Equipment: Radio producer - quad      Prior Function Level of Independence: Independent with assistive device(s)               Hand Dominance   Dominant Hand: Right    Extremity/Trunk Assessment   Upper Extremity Assessment: Overall WFL for tasks assessed           Lower Extremity Assessment: Overall WFL for tasks assessed      Cervical / Trunk Assessment:  (trunk lateral lean to the left)  Communication   Communication: No difficulties  Cognition Arousal/Alertness: Awake/alert Behavior During Therapy: WFL for tasks assessed/performed Overall Cognitive Status: Within Functional Limits for tasks assessed                      General Comments      Exercises        Assessment/Plan    PT Assessment Patient needs continued PT services  PT Diagnosis Difficulty walking;Generalized weakness   PT Problem List Decreased strength;Decreased balance;Decreased mobility  PT Treatment Interventions Gait training;Functional mobility training;Therapeutic exercise   PT Goals (Current goals can be found in the Care Plan section) Acute Rehab PT Goals PT Goal Formulation: With patient Time For Goal Achievement: 12/18/15 Potential to Achieve Goals: Good    Frequency Min 2X/week   Barriers to discharge  Co-evaluation               End of Session Equipment Utilized During Treatment: Gait belt Activity Tolerance: Other (comment) (decreased O2 saturation to 88 % with 2 L O2) Patient left: in bed      Functional Assessment Tool Used: functional reach Functional Limitation: Mobility: Walking and moving around Mobility: Walking and Moving Around Current Status (M0947): At least 20 percent but less than 40 percent impaired, limited or restricted Mobility: Walking and Moving Around Discharge Status [phone]): At least 20 percent but less than 40 percent impaired, limited or restricted    Time: 1645-1720 PT Time Calculation (min) (ACUTE ONLY): 35  min   Charges:   PT Evaluation $PT Eval Low Complexity: 1 Procedure PT Treatments $Gait Training: 8-22 mins   PT G Codes:   PT G-Codes **NOT FOR INPATIENT CLASS** Functional Assessment Tool Used: functional reach Functional Limitation: Mobility: Walking and moving around Mobility: Walking and Moving Around Current Status (Z6629): At least 20 percent but less than 40 percent impaired, limited or restricted Mobility: Walking and Moving Around Discharge Status [phone]): At least 20 percent but less than 40 percent impaired, limited or restricted    Arelia Sneddon S 12/03/2015, 5:44 PM

## 2015-12-03 NOTE — Progress Notes (Signed)
2 Days Post-Op  Subjective: Patient weighs of abdominal pain no nausea or vomiting she has not been up walking she does not have any chest pain. She is not short of breath.  Objective: Vital signs in last 24 hours: Temp:  [98.3 F (36.8 C)-98.8 F (37.1 C)] 98.3 F (36.8 C) (02/23 0532) Pulse Rate:  [62-81] 63 (02/23 0957) Resp:  [16-20] 18 (02/23 0956) BP: (74-120)/(47-70) 111/47 mmHg (02/23 0956) SpO2:  [86 %-96 %] 91 % (02/23 0957) Last BM Date: 12/03/15  Intake/Output from previous day: 02/22 0701 - 02/23 0700 In: 0  Out: 725 [Urine:725] Intake/Output this shift: Total I/O In: -  Out: 200 [Urine:200]  Physical exam:  Vital signs are reviewed her saturations remained low and supplemental oxygen is still indicated CTA RR Abdomen is soft and minimally distended nontender wounds are clean As are nontender  Lab Results: CBC   Recent Labs  12/01/15 0919  HGB 13.3  HCT 39.0   BMET  Recent Labs  12/01/15 0919  NA 141  K 4.8  GLUCOSE 99   PT/INR No results for input(s): LABPROT, INR in the last 72 hours. ABG No results for input(s): PHART, HCO3 in the last 72 hours.  Invalid input(s): PCO2, PO2  Studies/Results: Dg Chest Port 1 View  12/03/2015  CLINICAL DATA:  Postop cholecystectomy, some shortness of breath and sweating EXAM: PORTABLE CHEST 1 VIEW COMPARISON:  Chest x-ray of 11/23/2015 FINDINGS: Slightly prominent markings are present at the left lung base most likely due to atelectasis although developing pneumonia cannot be excluded. The right lung is clear. No definite effusion is seen. Moderate cardiomegaly is stable. Biapical pleural thickening is noted. There is prominence of the superior mediastinal soft tissues which may be due to due to ectatic great vessels or prominent thyroid gland. No bony abnormality is seen. IMPRESSION: 1. Probable linear atelectasis at the left lung base. Early pneumonia is also a consideration. 2. Stable cardiomegaly. 3.  Prominent superior mediastinal soft tissues may be due to ectatic great vessels or thyroid goiter. Correlate clinically. Electronically Signed   By: Ivar Drape M.D.   On: 12/03/2015 08:16    Anti-infectives: Anti-infectives    Start     Dose/Rate Route Frequency Ordered Stop   12/01/15 1200  cefOXItin (MEFOXIN) 2 g in dextrose 50 mL IVPB (premix)  Status:  Discontinued     2 g 100 mL/hr over 30 Minutes Intravenous 4 times per day 12/01/15 0951 12/01/15 1707   12/01/15 0958  cefOXItin (MEFOXIN) 2-2.2 GM-% IVPB    Comments:  Rivka Spring: cabinet override      12/01/15 0958 12/01/15 2159   12/01/15 0657  ceFAZolin (ANCEF) 2-3 GM-% IVPB SOLR    Comments:  Jola Baptist: cabinet override      12/01/15 0657 12/01/15 1859   12/01/15 0648  ceFAZolin (ANCEF) IVPB 2 g/50 mL premix  Status:  Discontinued     2 g 100 mL/hr over 30 Minutes Intravenous On call to O.R. 12/01/15 3817 12/01/15 0951      Assessment/Plan: s/p Procedure(s): LAPAROSCOPIC CHOLECYSTECTOMY WITH INTRAOPERATIVE CHOLANGIOGRAM   Chest x-rays personally reviewed suggesting atelectasis patient has not been up and walking will give physical therapy evaluation  Florene Glen, MD, FACS  12/03/2015

## 2015-12-04 DIAGNOSIS — N189 Chronic kidney disease, unspecified: Secondary | ICD-10-CM | POA: Diagnosis not present

## 2015-12-04 DIAGNOSIS — K8010 Calculus of gallbladder with chronic cholecystitis without obstruction: Secondary | ICD-10-CM | POA: Diagnosis not present

## 2015-12-04 DIAGNOSIS — I129 Hypertensive chronic kidney disease with stage 1 through stage 4 chronic kidney disease, or unspecified chronic kidney disease: Secondary | ICD-10-CM | POA: Diagnosis not present

## 2015-12-04 DIAGNOSIS — Z87891 Personal history of nicotine dependence: Secondary | ICD-10-CM | POA: Diagnosis not present

## 2015-12-04 DIAGNOSIS — I2510 Atherosclerotic heart disease of native coronary artery without angina pectoris: Secondary | ICD-10-CM | POA: Diagnosis not present

## 2015-12-04 DIAGNOSIS — J45909 Unspecified asthma, uncomplicated: Secondary | ICD-10-CM | POA: Diagnosis not present

## 2015-12-04 LAB — CREATININE, SERUM
CREATININE: 1.28 mg/dL — AB (ref 0.44–1.00)
GFR calc Af Amer: 46 mL/min — ABNORMAL LOW (ref >60)
GFR calc non Af Amer: 39 mL/min — ABNORMAL LOW (ref >60)

## 2015-12-04 NOTE — Discharge Summary (Signed)
Patient ID: Carolyn Hendricks MRN: [id number] DOB/AGE: Nov 13, 1935 80 y.o.  Admit date: 12/01/2015 Discharge date: 12/04/2015  Discharge Diagnoses:  Chronic cholecystitis  Procedures Performed: Laparoscopic cholecystectomy on 12/01/2015  Discharged Condition: stable  Hospital Course: 80 year old female with a history of gallstone pancreatitis and chronic cholecystitis and underwent an elective laparoscopic cholecystectomy with intraoperative cholangiogram by Dr. Azalee Course. Given her significant comorbidities her pulmonary status deteriorated and her saturation dropped needing oxygen. Patient was provided arching as well as pulmonary toilet and her condition improved. The patient was weaned off slowly from the oxygen at the time of discharge she was ambulating she was up oxygen saturating greater than 92%, her vital signs were stable, her chest was clear to auscultation bilaterally, cardiac: S1 and S2 normal sinus rhythm. Abdomen was soft minimal appropriate tenderness to palpation. Incisions were clean dry and intact without evidence of infection. Given her improvement from her pulmonary status the patient will be sent home instructions were given about importance of ambulation and pulmonary toilet and she understands.  Discharge Orders: Discharge Instructions    Call MD for:  difficulty breathing, headache or visual disturbances    Complete by:  As directed      Call MD for:  hives    Complete by:  As directed      Call MD for:  persistant dizziness or light-headedness    Complete by:  As directed      Call MD for:  persistant nausea and vomiting    Complete by:  As directed      Call MD for:  persistant nausea and vomiting    Complete by:  As directed      Call MD for:  redness, tenderness, or signs of infection (pain, swelling, redness, odor or green/yellow discharge around incision site)    Complete by:  As directed      Call MD for:  redness, tenderness, or signs of infection (pain,  swelling, redness, odor or green/yellow discharge around incision site)    Complete by:  As directed      Call MD for:  severe uncontrolled pain    Complete by:  As directed      Call MD for:  severe uncontrolled pain    Complete by:  As directed      Call MD for:  temperature >100.4    Complete by:  As directed      Call MD for:  temperature >100.4    Complete by:  As directed      Diet - low sodium heart healthy    Complete by:  As directed      Diet - low sodium heart healthy    Complete by:  As directed      Driving Restrictions    Complete by:  As directed   No driving while on prescription pain medication     Increase activity slowly    Complete by:  As directed      Increase activity slowly    Complete by:  As directed      Lifting restrictions    Complete by:  As directed   No lifting over 15lbs for 4 weeks     Lifting restrictions    Complete by:  As directed   No heavy lifting for 6 weeks ( less 20 lbs)     May shower / Bathe    Complete by:  As directed   May shower starting Thursday 12/03/2015     May walk  up steps    Complete by:  As directed      No dressing needed    Complete by:  As directed      No wound care    Complete by:  As directed      Walk with assistance    Complete by:  As directed            Disposition: 06-Home-Health Care Svc  Discharge Medications:  Current facility-administered medications:  .  aspirin EC tablet 81 mg, 81 mg, Oral, BH-q7a, Hubbard Robinson, MD, 81 mg at 12/04/15 0643 .  clopidogrel (PLAVIX) tablet 75 mg, 75 mg, Oral, BH-q7a, Hubbard Robinson, MD, 75 mg at 12/04/15 0643 .  cyclobenzaprine (FLEXERIL) tablet 5 mg, 5 mg, Oral, TID, Hubbard Robinson, MD, 5 mg at 12/04/15 1021 .  diltiazem (CARDIZEM) tablet 120 mg, 120 mg, Oral, BH-q7a, Hubbard Robinson, MD, 120 mg at 12/04/15 0643 .  fluticasone (FLONASE) 50 MCG/ACT nasal spray 2 spray, 2 spray, Each Nare, Daily, Hubbard Robinson, MD, 2 spray at 12/04/15 1022 .   furosemide (LASIX) tablet 20 mg, 20 mg, Oral, BID, Hubbard Robinson, MD, 20 mg at 12/04/15 0904 .  heparin injection 5,000 Units, 5,000 Units, Subcutaneous, 3 times per day, Hubbard Robinson, MD, 5,000 Units at 12/04/15 [phone] .  hydrALAZINE (APRESOLINE) injection 10 mg, 10 mg, Intravenous, Q2H PRN, Hubbard Robinson, MD .  irbesartan (AVAPRO) tablet 300 mg, 300 mg, Oral, Daily, 300 mg at 12/04/15 1020 **AND** hydrochlorothiazide (MICROZIDE) capsule 12.5 mg, 12.5 mg, Oral, Daily, Hubbard Robinson, MD, 12.5 mg at 12/04/15 1021 .  HYDROcodone-acetaminophen (NORCO) 10-325 MG per tablet 1 tablet, 1 tablet, Oral, Q4H PRN, Hubbard Robinson, MD, 1 tablet at 12/04/15 0905 .  isosorbide mononitrate (IMDUR) 24 hr tablet 30 mg, 30 mg, Oral, Daily, Hubbard Robinson, MD, 30 mg at 12/04/15 1020 .  ketorolac (TORADOL) 15 MG/ML injection 15 mg, 15 mg, Intravenous, 4 times per day, Hubbard Robinson, MD, 15 mg at 12/04/15 0644 .  levothyroxine (SYNTHROID, LEVOTHROID) tablet 75 mcg, 75 mcg, Oral, QAC breakfast, Hubbard Robinson, MD, 75 mcg at 12/04/15 0905 .  metoprolol tartrate (LOPRESSOR) tablet 75 mg, 75 mg, Oral, BID, Hubbard Robinson, MD, 75 mg at 12/04/15 1020 .  mometasone-formoterol (DULERA) 100-5 MCG/ACT inhaler 2 puff, 2 puff, Inhalation, BID, Hubbard Robinson, MD, 2 puff at 12/04/15 0905 .  morphine 2 MG/ML injection 1 mg, 1 mg, Intravenous, Q2H PRN, Hubbard Robinson, MD, 1 mg at 12/02/15 0803 .  mupirocin ointment (BACTROBAN) 2 % 1 application, 1 application, Topical, TID, Hubbard Robinson, MD, 1 application at 54/30/14 1022 .  ondansetron (ZOFRAN-ODT) disintegrating tablet 4 mg, 4 mg, Oral, Q6H PRN **OR** ondansetron (ZOFRAN) injection 4 mg, 4 mg, Intravenous, Q6H PRN, Hubbard Robinson, MD .  pantoprazole (PROTONIX) EC tablet 40 mg, 40 mg, Oral, Daily, Hubbard Robinson, MD, 40 mg at 12/04/15 1021 .  sertraline (ZOLOFT) tablet 50 mg, 50 mg, Oral, Daily, Hubbard Robinson, MD, 50 mg  at 12/04/15 1021 .  simvastatin (ZOCOR) tablet 20 mg, 20 mg, Oral, q1800, Hubbard Robinson, MD, 20 mg at 12/03/15 1816 .  tiotropium (SPIRIVA) inhalation capsule 18 mcg, 18 mcg, Inhalation, Daily, Hubbard Robinson, MD, 18 mcg at 12/04/15 8403  Follwup: Follow-up Information    Follow up with Hubbard Robinson, MD. Schedule an appointment as soon as possible for a visit in 2 weeks.  Specialty:  Surgery   Why:  For wound re-check   Contact information:   [place] [address] [phone]       Follow up with [facility] In 1 week.   Contact information:   [address]      Signed: Jules Husbands 12/04/2015, 11:09 AM   Caroleen Hamman, MD FACS

## 2015-12-04 NOTE — Progress Notes (Signed)
Patient is alert and oriented times four, ambulates with cane no acute distress noted. Patient given discharge instructions as ordered. Prescription for pain given to patient as well as follow up appointment. IV discontinued to right hand site without s/s of infection or infiltration.Patient is to receive home health.

## 2015-12-04 NOTE — Care Management Note (Signed)
Case Management Note  Patient Details  Name: Carolyn Hendricks MRN: [id number] Date of Birth: [birth year]  Subjective/Objective:                 Patient status post LAPAROSCOPIC CHOLECYSTECTOMY .  Patient lives at home with her daughter.  Daughter provides transportation.  Ph[facility]acy = [facility] [place].  Has rollator, cane, and BSC in the home.  PT has recommended home health PT.  Patient provided list of agencies.  Selected [facility].  Colletta [place] with [facility] notified of referral.  Patient was on Acute O2 this admission, has been weaned and did not have qualifying O2 sats.  RNCM signing off   Action/Plan:   Expected Discharge Date:                  Expected Discharge Plan:     In-House Referral:     Discharge planning Services     Post Acute Care Choice:    Choice offered to:     DME Arranged:    DME Agency:     HH Arranged:    [place] Agency:     Status of Service:     Medicare Important Message Given:    Date Medicare IM Given:    Medicare IM give by:    Date Additional Medicare IM Given:    Additional Medicare Important Message give by:     If discussed at [place] of Stay Meetings, dates discussed:    Additional Comments:  Beverly Sessions, RN 12/04/2015, 11:10 AM

## 2015-12-04 NOTE — Discharge Instructions (Signed)

## 2015-12-07 ENCOUNTER — Other Ambulatory Visit: Payer: Self-pay | Admitting: Family Medicine

## 2015-12-07 MED ORDER — HYDROCODONE-ACETAMINOPHEN 10-325 MG PO TABS: 1.0000 | ORAL_TABLET | ORAL | Status: DC | PRN

## 2015-12-07 NOTE — Telephone Encounter (Signed)
Pt contacted office for refill request on the following medications:  HYDROcodone-acetaminophen (NORCO) 10-325 MG tablet.  180 pills.  CB#[phone]/MW  Pt daughter states the hospital doctor wrote a Rx for HYDROcodone-acetaminophen ([place]) 10-325 MG tablet but they did not get the Rx filled due to getting this from Dr Caryn Section.

## 2015-12-07 NOTE — Telephone Encounter (Signed)
Please advise?

## 2015-12-07 NOTE — Addendum Note (Signed)
Addended by: Birdie Sons on: 12/07/2015 10:34 AM   Modules accepted: Orders

## 2015-12-15 ENCOUNTER — Encounter: Payer: Self-pay | Admitting: Surgery

## 2015-12-16 ENCOUNTER — Ambulatory Visit (INDEPENDENT_AMBULATORY_CARE_PROVIDER_SITE_OTHER): Payer: Medicare Other | Admitting: Surgery

## 2015-12-16 ENCOUNTER — Encounter: Payer: Self-pay | Admitting: Surgery

## 2015-12-16 VITALS — BP 146/74 | HR 70 | Temp 98.3°F | Wt 177.0 lb

## 2015-12-16 DIAGNOSIS — K429 Umbilical hernia without obstruction or gangrene: Secondary | ICD-10-CM

## 2015-12-16 DIAGNOSIS — K810 Acute cholecystitis: Secondary | ICD-10-CM

## 2015-12-16 NOTE — Progress Notes (Signed)
Outpatient postop visit  12/16/2015  Carolyn Hendricks is an 80 y.o. female.    Procedure:  Laparoscopic cholecystectomy with cholangiograms and umbilical hernia repair  CC: no pain  HPI:  Patient feels much better and is tolerating a diet she has no complaints at this time  Medications reviewed.    Physical Exam:  BP 146/74 mmHg  Pulse 70  Temp(Src) 98.3 F (36.8 C) (Oral)  Wt 177 lb (80.287 kg)    PE:  Abdomen soft nontender there is some minimal appears to be keloid-like area around her umbilical incision but other incisions are normal there is no erythema no drainage abs are nontender abdomen is nontender    Assessment/Plan:   she doing very well discussed the use of Mederma and she'll follow up on an as-needed basis  Florene Glen, MD, FACS

## 2015-12-16 NOTE — Patient Instructions (Signed)
Please give [place] a call if you have any questions or concerns.

## 2015-12-17 ENCOUNTER — Other Ambulatory Visit: Payer: Self-pay | Admitting: Family Medicine

## 2016-01-04 ENCOUNTER — Other Ambulatory Visit: Payer: Self-pay | Admitting: Family Medicine

## 2016-01-04 MED ORDER — HYDROCODONE-ACETAMINOPHEN 10-325 MG PO TABS: 1.0000 | ORAL_TABLET | ORAL | Status: DC | PRN

## 2016-01-04 NOTE — Telephone Encounter (Signed)
Pt's daughter Hilda Blades contacted office for refill request on the following medications: HYDROcodone-acetaminophen (NORCO) 10-325 MG tablet. Last OV: 10/22/15. Last written:12/07/15. Please advise. Thanks TNP

## 2016-01-07 ENCOUNTER — Encounter: Payer: Self-pay | Admitting: Family Medicine

## 2016-01-07 ENCOUNTER — Ambulatory Visit (INDEPENDENT_AMBULATORY_CARE_PROVIDER_SITE_OTHER): Payer: Medicare Other | Admitting: Family Medicine

## 2016-01-07 VITALS — BP 138/82 | HR 74 | Temp 98.4°F | Resp 20 | Wt 179.0 lb

## 2016-01-07 DIAGNOSIS — I6529 Occlusion and stenosis of unspecified carotid artery: Secondary | ICD-10-CM | POA: Diagnosis not present

## 2016-01-07 DIAGNOSIS — M4806 Spinal stenosis, lumbar region: Secondary | ICD-10-CM

## 2016-01-07 DIAGNOSIS — J449 Chronic obstructive pulmonary disease, unspecified: Secondary | ICD-10-CM

## 2016-01-07 DIAGNOSIS — R5383 Other fatigue: Secondary | ICD-10-CM | POA: Diagnosis not present

## 2016-01-07 DIAGNOSIS — R05 Cough: Secondary | ICD-10-CM | POA: Diagnosis not present

## 2016-01-07 DIAGNOSIS — R059 Cough, unspecified: Secondary | ICD-10-CM

## 2016-01-07 DIAGNOSIS — M48061 Spinal stenosis, lumbar region without neurogenic claudication: Secondary | ICD-10-CM

## 2016-01-07 MED ORDER — MONTELUKAST SODIUM 10 MG PO TABS: 10.0000 mg | ORAL_TABLET | Freq: Every day | ORAL | Status: DC

## 2016-01-07 MED ORDER — CYANOCOBALAMIN 1000 MCG/ML IJ SOLN: INTRAMUSCULAR | Status: DC

## 2016-01-07 NOTE — Progress Notes (Signed)
Patient: Carolyn Hendricks Female    DOB: [birth year]   80 y.o.   MRN: [id number] Visit Date: 01/07/2016  Today's Provider: [name], MD   Chief Complaint  Patient presents with  . Cough   Subjective:    HPI    Cough: Patient complains of symptoms of a URI. Symptoms include coryza, cough and diarrhea. Onset of symptoms was 5 months ago, gradually worsening since that time. She also c/o cough described as nonproductive, lightheadedness, shortness of breath and sneezing   Treatment to date: OTC medications, with relief.  She has been using [place] consistently and feels breathing is better.    Fatigue: She want's to start getting B12 shots from her pharmacist to see if would help her energy level. She had unremarkable CMP and CBC last month.    Allergies  Allergen Reactions  . Atorvastatin     Other reaction(s): Muscle Pain  . Crestor  [Rosuvastatin Calcium]     Other reaction(s): Muscle Pain  . Pravastatin Sodium     Muscle pain  . Tape Other (See Comments)    Makes a bruise on my skin. Paper tape is ok to use.   Previous Medications   ACLIDINIUM BROMIDE (TUDORZA PRESSAIR) 400 MCG/ACT AEPB    Inhale 1 puff into the lungs 2 (two) times daily.   ASPIRIN 81 MG TABLET    Take 81 mg by mouth every morning. Reported on 01/07/2016   CETIRIZINE (ZYRTEC) 10 MG TABLET    Take 10 mg by mouth at bedtime.    CLOPIDOGREL (PLAVIX) 75 MG TABLET    Take 75 mg by mouth every morning. Dr. Marko Plume instructed pt to stop Plavix 5 days before sugery.   DILTIAZEM (CARDIZEM) 120 MG TABLET    TAKE 1 TABLET BY MOUTH DAILY   FLUTICASONE (FLONASE) 50 MCG/ACT NASAL SPRAY    INHALE ONE TO TWO SPRAY IN EACH NOSTRIL DAILY AS NEEDED   FLUTICASONE-SALMETEROL (ADVAIR DISKUS) 250-50 MCG/DOSE AEPB    Inhale 1 puff into the lungs every 12 (twelve) hours.   FUROSEMIDE (LASIX) 20 MG TABLET    Take 20 mg by mouth. Pt reports she does not take lasix everyday, my bladder is so small, it makes me go to the  bathroom a lot.   HYDROCODONE-ACETAMINOPHEN (NORCO) 10-325 MG TABLET    Take 1 tablet by mouth every 4 (four) hours as needed for severe pain.   ISOSORBIDE MONONITRATE (IMDUR) 30 MG 24 HR TABLET    Take 1 tablet (30 mg total) by mouth daily.   LEVOCETIRIZINE (XYZAL) 5 MG TABLET    Take 5 mg by mouth every evening.   LEVOTHYROXINE (SYNTHROID, LEVOTHROID) 75 MCG TABLET    TAKE 1 TABLET BY MOUTH DAILY   LORAZEPAM (ATIVAN) 0.5 MG TABLET    Take 0.5 mg by mouth every 8 (eight) hours. As needed.   MELOXICAM (MOBIC) 15 MG TABLET    Take 1 tablet by mouth every morning.    METOPROLOL TARTRATE 75 MG TABS    Take 75 mg by mouth 2 (two) times daily.   MULTIPLE VITAMIN (MULTI-VITAMIN DAILY PO)    Take by mouth.   MUPIROCIN OINTMENT (BACTROBAN) 2 %    APPLY TO AFFECTED AREA(S) TWO TIMES PER DAY AS NEEDED   NITROGLYCERIN (NITROSTAT) 0.4 MG SL TABLET    Place 1 tablet (0.4 mg total) under the tongue every 5 (five) minutes as needed for chest pain.   PANTOPRAZOLE (PROTONIX) 40 MG  TABLET    Take 1 tablet (40 mg total) by mouth daily.   SERTRALINE (ZOLOFT) 50 MG TABLET    0.5 to 1 tablet daily   SIMVASTATIN (ZOCOR) 20 MG TABLET    Take 1 tablet (20 mg total) by mouth daily at 6 PM.   TRAZODONE (DESYREL) 100 MG TABLET    TAKE ONE-HALF OR ONE TABLET BY MOUTH AT BEDTIME AS NEEDED.   VALSARTAN-HYDROCHLOROTHIAZIDE (DIOVAN-HCT) 320-12.5 MG PER TABLET    Take 1 tablet by mouth every morning.     Review of Systems  Constitutional: Positive for fatigue. Negative for fever, chills, diaphoresis, activity change, appetite change and unexpected weight change.  HENT: Positive for rhinorrhea and sneezing. Negative for congestion, ear pain, sinus pressure and sore throat.   Respiratory: Positive for cough, shortness of breath and wheezing. Negative for chest tightness.   Cardiovascular: Negative for chest pain, palpitations and leg swelling.  Gastrointestinal: Negative for abdominal pain.    Social History  Substance Use  Topics  . Smoking status: Former Smoker -- 1.00 packs/day for 26 years    Types: Cigarettes    Quit date: 10/11/2011  . Smokeless tobacco: Not on file     Comment: Patient has smoked for 26 years; has quit but now started back 0.25 ppw.  . Alcohol Use: No   Objective:   BP 138/82 mmHg  Pulse 74  Temp(Src) 98.4 F (36.9 C) (Oral)  Resp 20  Wt 179 lb (81.194 kg)  SpO2 96%  Physical Exam  General Appearance:    Alert, cooperative, no distress  HENT:   bilateral TM normal without fluid or infection, neck without nodes, sinuses nontender, post nasal drip noted and nasal mucosa pale and congested  Eyes:    PERRL, conjunctiva/corneas clear, EOM's intact       Lungs:     Clear to auscultation bilaterally, respirations unlabored  Heart:    Regular rate and rhythm  Neurologic:   Awake, alert, oriented x 3. No apparent focal neurological           defect.           Assessment & Plan:     1. Cough I think this is due to post nasal drainage and will have her try singulair - montelukast (SINGULAIR) 10 MG tablet; Take 1 tablet (10 mg total) by mouth at bedtime.  Dispense: 30 tablet; Refill: 3  2. Chronic obstructive pulmonary disease, unspecified COPD type ([place]) Stable on Tudorza  3. Spinal stenosis of lumbar region without neurogenic claudication Current medications effective. Continue unchanged.   4. Other fatigue Given rx for b12 which she is going to have administered at her pharmacy.  - cyanocobalamin (,VITAMIN B-12,) 1000 MCG/ML injection; 1 ml injected subcutaneously once a month  Dispense: 10 mL; Refill: 1       [name], MD  [facility]

## 2016-01-18 DIAGNOSIS — I129 Hypertensive chronic kidney disease with stage 1 through stage 4 chronic kidney disease, or unspecified chronic kidney disease: Secondary | ICD-10-CM | POA: Diagnosis not present

## 2016-01-18 DIAGNOSIS — N2581 Secondary hyperparathyroidism of renal origin: Secondary | ICD-10-CM | POA: Diagnosis not present

## 2016-01-18 DIAGNOSIS — N183 Chronic kidney disease, stage 3 (moderate): Secondary | ICD-10-CM | POA: Diagnosis not present

## 2016-01-18 DIAGNOSIS — R809 Proteinuria, unspecified: Secondary | ICD-10-CM | POA: Diagnosis not present

## 2016-01-28 DIAGNOSIS — D631 Anemia in chronic kidney disease: Secondary | ICD-10-CM | POA: Diagnosis not present

## 2016-01-28 DIAGNOSIS — N183 Chronic kidney disease, stage 3 (moderate): Secondary | ICD-10-CM | POA: Diagnosis not present

## 2016-01-28 DIAGNOSIS — I10 Essential (primary) hypertension: Secondary | ICD-10-CM | POA: Diagnosis not present

## 2016-02-04 DIAGNOSIS — D631 Anemia in chronic kidney disease: Secondary | ICD-10-CM | POA: Diagnosis not present

## 2016-02-04 DIAGNOSIS — N2581 Secondary hyperparathyroidism of renal origin: Secondary | ICD-10-CM | POA: Diagnosis not present

## 2016-02-04 DIAGNOSIS — I10 Essential (primary) hypertension: Secondary | ICD-10-CM | POA: Diagnosis not present

## 2016-02-04 DIAGNOSIS — F17200 Nicotine dependence, unspecified, uncomplicated: Secondary | ICD-10-CM | POA: Diagnosis not present

## 2016-02-04 DIAGNOSIS — N183 Chronic kidney disease, stage 3 (moderate): Secondary | ICD-10-CM | POA: Diagnosis not present

## 2016-02-04 DIAGNOSIS — R809 Proteinuria, unspecified: Secondary | ICD-10-CM | POA: Diagnosis not present

## 2016-02-09 ENCOUNTER — Encounter (INDEPENDENT_AMBULATORY_CARE_PROVIDER_SITE_OTHER): Payer: Self-pay

## 2016-02-09 ENCOUNTER — Ambulatory Visit: Payer: Medicare Other

## 2016-02-09 DIAGNOSIS — I6529 Occlusion and stenosis of unspecified carotid artery: Secondary | ICD-10-CM | POA: Diagnosis not present

## 2016-02-10 ENCOUNTER — Other Ambulatory Visit: Payer: Self-pay | Admitting: Family Medicine

## 2016-02-10 MED ORDER — HYDROCODONE-ACETAMINOPHEN 10-325 MG PO TABS: 1.0000 | ORAL_TABLET | ORAL | Status: DC | PRN

## 2016-02-10 NOTE — Addendum Note (Signed)
Addended by: Julieta Bellini on: 02/10/2016 11:26 AM   Modules accepted: Orders

## 2016-02-10 NOTE — Telephone Encounter (Signed)
Pt contacted office for refill request on the following medications: HYDROcodone-acetaminophen (NORCO) 10-325 MG tablet. Please advise. Thanks TNP

## 2016-02-15 ENCOUNTER — Other Ambulatory Visit: Payer: Self-pay | Admitting: Family Medicine

## 2016-02-25 DIAGNOSIS — I10 Essential (primary) hypertension: Secondary | ICD-10-CM | POA: Diagnosis not present

## 2016-02-25 DIAGNOSIS — R809 Proteinuria, unspecified: Secondary | ICD-10-CM | POA: Diagnosis not present

## 2016-02-25 DIAGNOSIS — N2581 Secondary hyperparathyroidism of renal origin: Secondary | ICD-10-CM | POA: Diagnosis not present

## 2016-02-25 DIAGNOSIS — D631 Anemia in chronic kidney disease: Secondary | ICD-10-CM | POA: Diagnosis not present

## 2016-02-25 DIAGNOSIS — N183 Chronic kidney disease, stage 3 (moderate): Secondary | ICD-10-CM | POA: Diagnosis not present

## 2016-02-26 DIAGNOSIS — N183 Chronic kidney disease, stage 3 (moderate): Secondary | ICD-10-CM | POA: Diagnosis not present

## 2016-02-26 DIAGNOSIS — E875 Hyperkalemia: Secondary | ICD-10-CM | POA: Diagnosis not present

## 2016-02-26 DIAGNOSIS — D631 Anemia in chronic kidney disease: Secondary | ICD-10-CM | POA: Diagnosis not present

## 2016-02-26 DIAGNOSIS — I129 Hypertensive chronic kidney disease with stage 1 through stage 4 chronic kidney disease, or unspecified chronic kidney disease: Secondary | ICD-10-CM | POA: Diagnosis not present

## 2016-02-29 ENCOUNTER — Ambulatory Visit: Payer: Self-pay | Admitting: Cardiovascular Disease

## 2016-03-08 ENCOUNTER — Ambulatory Visit (INDEPENDENT_AMBULATORY_CARE_PROVIDER_SITE_OTHER): Payer: Medicare Other | Admitting: Cardiovascular Disease

## 2016-03-08 ENCOUNTER — Encounter: Payer: Self-pay | Admitting: Cardiovascular Disease

## 2016-03-08 VITALS — BP 130/80 | HR 61 | Ht 59.0 in | Wt 187.5 lb

## 2016-03-08 DIAGNOSIS — M7989 Other specified soft tissue disorders: Secondary | ICD-10-CM

## 2016-03-08 DIAGNOSIS — I471 Supraventricular tachycardia: Secondary | ICD-10-CM | POA: Diagnosis not present

## 2016-03-08 DIAGNOSIS — R0602 Shortness of breath: Secondary | ICD-10-CM | POA: Diagnosis not present

## 2016-03-08 DIAGNOSIS — I6523 Occlusion and stenosis of bilateral carotid arteries: Secondary | ICD-10-CM | POA: Diagnosis not present

## 2016-03-08 DIAGNOSIS — R079 Chest pain, unspecified: Secondary | ICD-10-CM | POA: Diagnosis not present

## 2016-03-08 DIAGNOSIS — E785 Hyperlipidemia, unspecified: Secondary | ICD-10-CM

## 2016-03-08 MED ORDER — METOPROLOL TARTRATE 100 MG PO TABS: 100.0000 mg | ORAL_TABLET | Freq: Two times a day (BID) | ORAL | Status: DC

## 2016-03-08 NOTE — Patient Instructions (Addendum)
You are doing well.  You have swelling in the legs Stop the diltiazem/cardizem  Please increase the metoprolol up to 100 mg twice a day  Please call us if you have new issues that need to be addressed before your next appt.  Your physician wants you to follow-up in: 6 months.  You will receive a reminder letter in the mail two months in advance. If you don't receive a letter, please call our office to schedule the follow-up appointment.

## 2016-03-08 NOTE — Progress Notes (Signed)
Patient ID: Carolyn Hendricks, female    DOB: April 30, 1936, 80 y.o.   MRN: [id number]  HPI Comments: Carolyn Hendricks is a 80 year old woman with history of hypertension, MRSA, long smoking history for 20 years, arthritis, presented to the emergency room May 26th 2013 with a buttock pain from abscess, also with malaise, nausea vomiting, fever. Cardiology consult in for atrial tachycardia, heart rate of 150 beats per minute. Heart rate resolved with calcium channel blocker. Noted to have left carotid bruit with ultrasound showing 75% plus left carotid stenosis. 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 Fletcher Anon.  she presents for follow-up of her tachycardia   in follow-up today, she reports worsening leg edema  she has been taking increasing amounts of Lasix for the past 2-3 weeks with no improvement in her symptoms.  legs are tight, erythematous, sore to  Touch  review of her medication list shows new medication in the past several months, Cardizem 120 mg daily.  She feels this was started in the hospital when she had "gallbladder attack"   denies any significant shortness of breath on exertion   EKG on today's visit shows normal sinus rhythm with rate 60 bpm, left bundle branch block, no change since prior EKG   other past medical history  She was started on Lasix 20 mg daily by Dr. Caryn Section for leg edema. Dosing was increased up to 20 mg twice a day by kidney physician, Dr. Abigail Butts. Lab work from 04/27/2015 shows creatinine 1.66, BUN 30 which is well above her baseline  leg swelling on diltiazem , improved by holding the medication   angiography of her carotid 02/20/2014 showing severe disease on the left.   possible TIA  , evaluation in the hospital, discharge 02/20/2014. She had dizziness, lightheadedness, bradycardia. She was continued on her beta blocker, Cardizem was held for bradycardia  Echocardiogram performed 02/17/2014 while she was in the hospital. This showed normal ejection fraction, diastolic dysfunction, mild MR and TR She stopped smoking approximately several years ago  She is tolerating simvastatin 10 mg daily with no side effects. She reports having side effects on Crestor and Lipitor.  She has  seen the renal service and diagnosed with stage III chronic kidney disease.  Echocardiogram in the hospital 03/04/2012 showed normal LV systolic function, diastolic dysfunction, otherwise normal study     Allergies  Allergen Reactions  . Atorvastatin     Other reaction(s): Muscle Pain  . Crestor  [Rosuvastatin Calcium]     Other reaction(s): Muscle Pain  . Pravastatin Sodium     Muscle pain  . Tape Other (See Comments)    Makes a bruise on my skin. Paper tape is ok to use.    Current Outpatient Prescriptions on File Prior to Visit  Medication Sig Dispense Refill  . Aclidinium Bromide (TUDORZA PRESSAIR) 400 MCG/ACT AEPB Inhale 1 puff into the lungs 2 (two) times daily. (Patient taking differently: Inhale 1 puff into the lungs 2 (two) times daily. As needed.) 2 each 0  . aspirin 81 MG tablet Take 81 mg by mouth every morning. Reported on 01/07/2016    . cetirizine (ZYRTEC) 10 MG tablet Take 10 mg by mouth at bedtime.     Marland Kitchen  clopidogrel (PLAVIX) 75 MG tablet Take 75 mg by mouth every morning. Dr. Marko Plume instructed pt to stop Plavix 5 days before sugery.    . cyanocobalamin (,VITAMIN B-12,) 1000 MCG/ML injection 1 ml injected subcutaneously once a month 10 mL 1  . fluticasone (FLONASE) 50 MCG/ACT nasal spray INHALE ONE TO TWO SPRAY IN EACH NOSTRIL DAILY AS NEEDED 16 g 6  . Fluticasone-Salmeterol (ADVAIR DISKUS) 250-50 MCG/DOSE AEPB Inhale 1 puff into the lungs every 12 (twelve) hours. 60 each 12   . furosemide (LASIX) 20 MG tablet Take 20 mg by mouth. Pt reports she does not take lasix everyday, my bladder is so small, it makes me go to the bathroom a lot.    Marland Kitchen HYDROcodone-acetaminophen (NORCO) 10-325 MG tablet Take 1 tablet by mouth every 4 (four) hours as needed for severe pain. 180 tablet 0  . isosorbide mononitrate (IMDUR) 30 MG 24 hr tablet Take 1 tablet (30 mg total) by mouth daily. (Patient taking differently: Take 30 mg by mouth every morning. ) 30 tablet 11  . levocetirizine (XYZAL) 5 MG tablet Take 5 mg by mouth every evening.    Marland Kitchen levothyroxine (SYNTHROID, LEVOTHROID) 75 MCG tablet TAKE 1 TABLET BY MOUTH DAILY (Patient taking differently: TAKE 1 TABLET BY MOUTH DAILY in am) 30 tablet 4  . LORazepam (ATIVAN) 0.5 MG tablet Take 0.5 mg by mouth every 8 (eight) hours. As needed.    . meloxicam (MOBIC) 15 MG tablet Take 1 tablet by mouth every morning.     . Multiple Vitamin (MULTI-VITAMIN DAILY PO) Take by mouth.    . mupirocin ointment (BACTROBAN) 2 % APPLY TO AFFECTED AREA(S) TWO TIMES PER DAY AS NEEDED 22 g 5  . nitroGLYCERIN (NITROSTAT) 0.4 MG SL tablet Place 1 tablet (0.4 mg total) under the tongue every 5 (five) minutes as needed for chest pain. 25 tablet 3  . pantoprazole (PROTONIX) 40 MG tablet Take 1 tablet (40 mg total) by mouth daily. (Patient taking differently: Take 40 mg by mouth every morning. ) 30 tablet 0  . sertraline (ZOLOFT) 50 MG tablet TAKE 0.5 TO 1 TABLET BY MOUTH DAILY FOR MOOD 30 tablet 12  . simvastatin (ZOCOR) 20 MG tablet Take 1 tablet (20 mg total) by mouth daily at 6 PM. 90 tablet 3  . traZODone (DESYREL) 100 MG tablet TAKE ONE-HALF OR ONE TABLET BY MOUTH AT BEDTIME AS NEEDED. 30 tablet 5  . valsartan-hydrochlorothiazide (DIOVAN-HCT) 320-12.5 MG per tablet Take 1 tablet by mouth every morning.      No current facility-administered medications on file prior to visit.    Past Medical History  Diagnosis Date  . Heart murmur   . Head ache   . Chest  pain   . Shortness of breath   . Dizziness   . High blood pressure   . Arthritis   . History of MRSA infection   . Diastolic dysfunction   . Chronic kidney disease     Past Surgical History  Procedure Laterality Date  . Carotid endarterectomy Left Dr. Lucky Cowboy  . Knee replacement (other) Left 2002  . Foot and ankle surgery    . Appendectomy  1950  . Cardiac catheterization N/A 06/12/2015    Procedure: Left Heart Cath and Coronary Angiography;  Surgeon: Minna Merritts, MD;  Location: [facility];  Service: Cardiovascular;  Laterality: N/A;  . Joint replacement    . Fracture surgery    . Cholecystectomy N/A 12/01/2015    Procedure: LAPAROSCOPIC  CHOLECYSTECTOMY WITH INTRAOPERATIVE CHOLANGIOGRAM;  Surgeon: Hubbard Robinson, MD;  Location: [facility];  Service: General;  Laterality: N/A;    Social History  reports that she quit smoking about 4 years ago. Her smoking use included Cigarettes. She has a 26 pack-year smoking history. She does not have any smokeless tobacco history on file. She reports that she does not drink alcohol or use illicit drugs.  Family History family history includes Cancer in her brother; Congestive Heart Failure (age of onset: 47) in her mother; Kidney failure in her father.        Review of Systems  Constitutional: Negative.   Respiratory: Negative.   Cardiovascular: Positive for leg swelling.  Gastrointestinal: Negative.   Musculoskeletal: Positive for back pain and gait problem.  Skin: Negative.   Neurological: Negative.   Hematological: Negative.   Psychiatric/Behavioral: Negative.   All other systems reviewed and are negative.  BP 130/80 mmHg  Pulse 61  Ht 4' 11" (1.499 m)  Wt 187 lb 8 oz (85.049 kg)  BMI 37.85 kg/m2  Physical Exam  Constitutional: She is oriented to person, place, and time. She appears well-developed and well-nourished.  HENT:  Head: Normocephalic.  Nose: Nose normal.  Mouth/Throat: Oropharynx is clear and moist.   Eyes: Conjunctivae are normal. Pupils are equal, round, and reactive to light.  Neck: Normal range of motion. Neck supple. No JVD present. Carotid bruit is present.  Cardiovascular: Normal rate, regular rhythm, S1 normal, S2 normal, normal heart sounds and intact distal pulses.  Exam reveals no gallop and no friction rub.   No murmur heard. Trace to 1+ pitting edema  Pulmonary/Chest: Effort normal. No respiratory distress. She has decreased breath sounds. She has no wheezes. She has no rales. She exhibits no tenderness.  Abdominal: Soft. Bowel sounds are normal. She exhibits no distension. There is no tenderness.  Musculoskeletal: Normal range of motion. She exhibits no edema or tenderness.  Lymphadenopathy:    She has no cervical adenopathy.  Neurological: She is alert and oriented to person, place, and time. Coordination normal.  Skin: Skin is warm and dry. No rash noted. No erythema.  Psychiatric: She has a normal mood and affect. Her behavior is normal. Judgment and thought content normal.    Assessment and Plan  Nursing note and vitals reviewed.

## 2016-03-08 NOTE — Assessment & Plan Note (Signed)
Cardizem added  Since her last clinic visit  now with worsening  Leg swelling, already taking Lasix with no improvement  recommended she stop the Cardizem, increase metoprolol up to 100 mg twice a day, up from 75 mg twice a day

## 2016-03-08 NOTE — Assessment & Plan Note (Signed)
Followed by Dr. Lucky Cowboy  stress importance of aggressive lipid management  may need to add zetia  For goal LDL less than 70

## 2016-03-08 NOTE — Assessment & Plan Note (Signed)
Tolerating simvastatin  we'll discuss adding Zetia  On her next clinic visit

## 2016-03-08 NOTE — Assessment & Plan Note (Signed)
Heart rate relatively well-controlled, she denies any symptoms concerning for tachycardia  we will stop her Cardizem in the setting of worsening leg swelling

## 2016-03-14 ENCOUNTER — Ambulatory Visit: Payer: Self-pay | Admitting: Cardiovascular Disease

## 2016-03-21 ENCOUNTER — Other Ambulatory Visit: Payer: Self-pay | Admitting: Family Medicine

## 2016-03-21 MED ORDER — HYDROCODONE-ACETAMINOPHEN 10-325 MG PO TABS: 1.0000 | ORAL_TABLET | ORAL | Status: DC | PRN

## 2016-03-21 NOTE — Telephone Encounter (Signed)
Pt's daughter requested a refill on her mother's pain medication.  States she is almost out.

## 2016-04-08 ENCOUNTER — Encounter: Payer: Self-pay | Admitting: Family Medicine

## 2016-04-13 ENCOUNTER — Other Ambulatory Visit: Payer: Self-pay | Admitting: Family Medicine

## 2016-04-13 ENCOUNTER — Other Ambulatory Visit: Payer: Self-pay | Admitting: Cardiovascular Disease

## 2016-04-14 ENCOUNTER — Ambulatory Visit (INDEPENDENT_AMBULATORY_CARE_PROVIDER_SITE_OTHER): Payer: Medicare Other | Admitting: Family Medicine

## 2016-04-14 ENCOUNTER — Other Ambulatory Visit: Payer: Self-pay | Admitting: Family Medicine

## 2016-04-14 ENCOUNTER — Encounter: Payer: Self-pay | Admitting: Family Medicine

## 2016-04-14 ENCOUNTER — Ambulatory Visit
Admission: RE | Admit: 2016-04-14 | Discharge: 2016-04-14 | Disposition: A | Discharge status: Home / Self Care | Payer: Medicare Other | Source: Ambulatory Visit | Attending: Family Medicine | Admitting: Family Medicine

## 2016-04-14 VITALS — BP 156/82 | HR 72 | Temp 98.3°F | Resp 16 | Ht 60.25 in | Wt 193.0 lb

## 2016-04-14 DIAGNOSIS — R0609 Other forms of dyspnea: Secondary | ICD-10-CM | POA: Diagnosis not present

## 2016-04-14 DIAGNOSIS — R05 Cough: Secondary | ICD-10-CM | POA: Insufficient documentation

## 2016-04-14 DIAGNOSIS — I6523 Occlusion and stenosis of bilateral carotid arteries: Secondary | ICD-10-CM | POA: Diagnosis not present

## 2016-04-14 DIAGNOSIS — I517 Cardiomegaly: Secondary | ICD-10-CM | POA: Insufficient documentation

## 2016-04-14 DIAGNOSIS — N183 Chronic kidney disease, stage 3 unspecified: Secondary | ICD-10-CM

## 2016-04-14 DIAGNOSIS — Z0000 Encounter for general adult medical examination without abnormal findings: Secondary | ICD-10-CM | POA: Diagnosis not present

## 2016-04-14 DIAGNOSIS — J189 Pneumonia, unspecified organism: Secondary | ICD-10-CM | POA: Insufficient documentation

## 2016-04-14 DIAGNOSIS — M48061 Spinal stenosis, lumbar region without neurogenic claudication: Secondary | ICD-10-CM

## 2016-04-14 DIAGNOSIS — I700 Atherosclerosis of aorta: Secondary | ICD-10-CM | POA: Diagnosis not present

## 2016-04-14 DIAGNOSIS — R0602 Shortness of breath: Secondary | ICD-10-CM | POA: Diagnosis not present

## 2016-04-14 DIAGNOSIS — M4806 Spinal stenosis, lumbar region: Secondary | ICD-10-CM | POA: Diagnosis not present

## 2016-04-14 DIAGNOSIS — J3089 Other allergic rhinitis: Secondary | ICD-10-CM

## 2016-04-14 DIAGNOSIS — J449 Chronic obstructive pulmonary disease, unspecified: Secondary | ICD-10-CM | POA: Diagnosis not present

## 2016-04-14 DIAGNOSIS — I10 Essential (primary) hypertension: Secondary | ICD-10-CM

## 2016-04-14 DIAGNOSIS — E785 Hyperlipidemia, unspecified: Secondary | ICD-10-CM

## 2016-04-14 DIAGNOSIS — E038 Other specified hypothyroidism: Secondary | ICD-10-CM

## 2016-04-14 DIAGNOSIS — R059 Cough, unspecified: Secondary | ICD-10-CM

## 2016-04-14 MED ORDER — LEVOFLOXACIN 500 MG PO TABS: 500.0000 mg | ORAL_TABLET | Freq: Every day | ORAL | Status: AC

## 2016-04-14 MED ORDER — PREDNISONE 20 MG PO TABS: 20.0000 mg | ORAL_TABLET | Freq: Two times a day (BID) | ORAL | Status: AC

## 2016-04-14 NOTE — Progress Notes (Signed)
Patient: Carolyn Hendricks, Female    DOB: [birth year], 80 y.o.   MRN: [id number] Visit Date: 04/14/2016  Today's Provider: [name], MD   Chief Complaint  Patient presents with  . Medicare Wellness  . Hypertension  . COPD  . Hypothyroidism  . Hyperlipidemia   Subjective:    Annual wellness visit Carolyn Hendricks is a 80 y.o. female. She feels poorly. She reports never exercising . She reports she is sleeping poorly.  -----------------------------------------------------------  Hypertension, follow-up:  BP Readings from Last 3 Encounters:  03/08/16 130/80  01/07/16 138/82  12/16/15 146/74    She was last seen for hypertension 1 years ago.  BP at that visit was 138/72. Management since that visit includes no changes. She reports good compliance with treatment. She is not having side effects.  She is not exercising. She is adherent to low salt diet.   Outside blood pressures are 137/70. She is experiencing dyspnea, fatigue, lower extremity edema and palpitations.  Patient denies chest pain, irregular heart beat, near-syncope, syncope and tachypnea.   Cardiovascular risk factors include advanced age (older than 27 for men, 70 for women), dyslipidemia and hypertension.  Use of agents associated with hypertension: NSAIDS.     Weight trend: increasing steadily Wt Readings from Last 3 Encounters:  03/08/16 187 lb 8 oz (85.049 kg)  01/07/16 179 lb (81.194 kg)  12/16/15 177 lb (80.287 kg)    Current diet: in general, an "unhealthy" diet  ------------------------------------------------------------------------  Follow up COPD:  Last office visit was 4 months ago and no changes were made. Patient reports good compliance with treatment, good tolerance and poor symptom control. Breathing has worsened in the past few weeks  Follow up Hypothyroidism:  Last office visit was 1 year ago. Changes made during that visit includes increasing Levothyroxine to 12mg daily.  Patient reports good compliance with treatment and good tolerance.   Lipid/Cholesterol, Follow-up:   Last seen for this1 years ago.  Management changes since that visit include none. . Last Lipid Panel:    Component Value Date/Time   CHOL 181 12/12/2014   CHOL 169 02/18/2014 0535   TRIG 218* 12/12/2014   TRIG 175 02/18/2014 0535   HDL 43 12/12/2014   HDL 44 02/18/2014 0535   VLDL 35 02/18/2014 0535   LDLCALC 94 12/12/2014   LDLCALC 90 02/18/2014 0535    Risk factors for vascular disease include hypercholesterolemia and hypertension  She reports good compliance with treatment. She is not having side effects.  Current symptoms include none and have been stable. Weight trend: increasing steadily Prior visit with dietician: no Current diet: in general, an "unhealthy" diet Current exercise: none  Wt Readings from Last 3 Encounters:  03/08/16 187 lb 8 oz (85.049 kg)  01/07/16 179 lb (81.194 kg)  12/16/15 177 lb (80.287 kg)    -------------------------------------------------------------------    Review of Systems  Constitutional: Positive for activity change and fatigue.  HENT: Positive for congestion and sneezing.   Respiratory: Positive for cough, shortness of breath and wheezing.   Cardiovascular: Positive for palpitations and leg swelling.  Gastrointestinal: Positive for abdominal pain, diarrhea and abdominal distention.  Genitourinary: Positive for frequency.  Musculoskeletal: Positive for myalgias, back pain, arthralgias and gait problem.  Skin: Positive for wound.  Neurological: Positive for weakness and headaches.  Hematological: Bruises/bleeds easily.  Psychiatric/Behavioral: Positive for confusion and sleep disturbance.  All other systems reviewed and are negative.   Social History   Social History  .  Marital Status: Widowed    Spouse Name: N/A  . Number of Children: 4  . Years of Education: 12   Occupational History  . retired    Social History  Main Topics  . Smoking status: Former Smoker -- 1.00 packs/day for 26 years    Types: Cigarettes    Quit date: 10/11/2011  . Smokeless tobacco: Not on file     Comment: Patient has smoked for 26 years; has quit but now started back 0.25 ppw.  . Alcohol Use: No  . Drug Use: No  . Sexual Activity: Not on file   Other Topics Concern  . Not on file   Social History Narrative    Past Medical History  Diagnosis Date  . Heart murmur   . Head ache   . Chest pain   . Shortness of breath   . Dizziness   . High blood pressure   . Arthritis   . History of MRSA infection   . Diastolic dysfunction   . Chronic kidney disease      Patient Active Problem List   Diagnosis Date Noted  . S/P laparoscopic cholecystectomy 12/01/2015  . Chronic cholecystitis with calculus   . Umbilical hernia without obstruction and without gangrene   . COPD (chronic obstructive pulmonary disease) ([place]) 10/22/2015  . Peripheral vascular disease ([place]) 10/15/2015  . Difficulty in walking 10/15/2015  . Airway hyperreactivity 10/15/2015  . Gallstone pancreatitis   . MRSA cellulitis 06/29/2015  . PAD (peripheral artery disease) ([place])   . SOB (shortness of breath)   . LBBB (left bundle branch block)   . Angina pectoris ([place]) 05/14/2015  . Leg swelling 05/14/2015  . Allergic rhinitis 04/17/2015  . Anxiety 04/17/2015  . Arthralgia of hand 04/17/2015  . Chronic kidney disease (CKD), stage III (moderate) 04/17/2015  . Fatigue 04/17/2015  . Numbness and tingling 04/17/2015  . Nonspecific abnormal finding in stool contents 04/17/2015  . Restless leg 04/17/2015  . Hypothyroidism 07/09/2014  . Abnormal mammogram 11/01/2012  . Carotid stenosis 03/28/2012  . Atrial tachycardia ([place]) 03/28/2012  . Hyperlipidemia 03/28/2012  . Carotid artery obstruction 03/28/2012  . Supraventricular tachycardia ([place]) 03/28/2012  . Hyperpotassemia 02/27/2010  . Heart murmur 03/10/2009  . Insomnia 03/05/2009  . Essential  (primary) hypertension 01/29/2009  . Goiter 01/29/2009  . History of tobacco use 01/29/2009  . Spinal stenosis of lumbar region without neurogenic claudication 07/14/2003    Past Surgical History  Procedure Laterality Date  . Carotid endarterectomy Left Dr. Lucky Cowboy  . Knee replacement (other) Left 2002  . Foot and ankle surgery    . Appendectomy  1950  . Cardiac catheterization N/A 06/12/2015    Procedure: Left Heart Cath and Coronary Angiography;  Surgeon: Minna Merritts, MD;  Location: [facility];  Service: Cardiovascular;  Laterality: N/A;  . Joint replacement    . Fracture surgery    . Cholecystectomy N/A 12/01/2015    Procedure: LAPAROSCOPIC CHOLECYSTECTOMY WITH INTRAOPERATIVE CHOLANGIOGRAM;  Surgeon: Hubbard Robinson, MD;  Location: [facility];  Service: General;  Laterality: N/A;    Her family history includes Cancer in her brother; Congestive Heart Failure (age of onset: 83) in her mother; Kidney failure in her father.    Current Meds  Medication Sig  . Aclidinium Bromide (TUDORZA PRESSAIR) 400 MCG/ACT AEPB Inhale 1 puff into the lungs 2 (two) times daily. (Patient taking differently: Inhale 1 puff into the lungs 2 (two) times daily. As needed.)  . aspirin 81 MG tablet  Take 81 mg by mouth every morning. Reported on 01/07/2016  . cetirizine (ZYRTEC) 10 MG tablet Take 10 mg by mouth at bedtime.   . clopidogrel (PLAVIX) 75 MG tablet Take 75 mg by mouth every morning. Dr. Marko Plume instructed pt to stop Plavix 5 days before sugery.  . cyanocobalamin (,VITAMIN B-12,) 1000 MCG/ML injection 1 ml injected subcutaneously once a month  . Ferrous Sulfate (IRON) 325 (65 Fe) MG TABS Take by mouth.  . fluticasone (FLONASE) 50 MCG/ACT nasal spray INHALE ONE TO TWO SPRAY IN EACH NOSTRIL DAILY AS NEEDED  . Fluticasone-Salmeterol (ADVAIR DISKUS) 250-50 MCG/DOSE AEPB Inhale 1 puff into the lungs every 12 (twelve) hours.  . furosemide (LASIX) 20 MG tablet Take 20 mg by mouth. Pt reports she does  not take lasix everyday, my bladder is so small, it makes me go to the bathroom a lot.  Marland Kitchen HYDROcodone-acetaminophen (NORCO) 10-325 MG tablet Take 1 tablet by mouth every 4 (four) hours as needed for severe pain.  . isosorbide mononitrate (IMDUR) 30 MG 24 hr tablet Take 1 tablet (30 mg total) by mouth daily. (Patient taking differently: Take 30 mg by mouth every morning. )  . levocetirizine (XYZAL) 5 MG tablet Take 5 mg by mouth every evening.  Marland Kitchen levothyroxine (SYNTHROID, LEVOTHROID) 75 MCG tablet TAKE 1 TABLET BY MOUTH DAILY (FOR THYROID)  . LORazepam (ATIVAN) 0.5 MG tablet Take 0.5 mg by mouth every 8 (eight) hours. As needed.  . meloxicam (MOBIC) 15 MG tablet Take 1 tablet by mouth every morning.   . metoprolol (LOPRESSOR) 100 MG tablet Take 1 tablet (100 mg total) by mouth 2 (two) times daily.  . Multiple Vitamin (MULTI-VITAMIN DAILY PO) Take by mouth.  . mupirocin ointment (BACTROBAN) 2 % APPLY TO AFFECTED AREA(S) TWO TIMES PER DAY AS NEEDED  . nitroGLYCERIN (NITROSTAT) 0.4 MG SL tablet Place 1 tablet (0.4 mg total) under the tongue every 5 (five) minutes as needed for chest pain.  . pantoprazole (PROTONIX) 40 MG tablet Take 1 tablet (40 mg total) by mouth daily. (Patient taking differently: Take 40 mg by mouth every morning. )  . sertraline (ZOLOFT) 50 MG tablet TAKE 0.5 TO 1 TABLET BY MOUTH DAILY FOR MOOD  . simvastatin (ZOCOR) 20 MG tablet TAKE 1 TABLET BY MOUTH DAILY AT 6PM.  . sodium bicarbonate 650 MG tablet Take 650 mg by mouth 3 (three) times daily.  . traZODone (DESYREL) 100 MG tablet TAKE ONE-HALF OR ONE TABLET BY MOUTH AT BEDTIME AS NEEDED.  Marland Kitchen valsartan-hydrochlorothiazide (DIOVAN-HCT) 320-12.5 MG per tablet Take 1 tablet by mouth every morning.     Patient Care Team: Birdie Sons, MD as PCP - General (Family Medicine) Minna Merritts, MD as Consulting Physician (Cardiology) Algernon Huxley, MD as Referring Physician (Vascular Surgery) Hubbard Robinson, MD as Consulting  Physician (Surgery)    Objective:   Vitals: BP 156/82 mmHg  Pulse 72  Temp(Src) 98.3 F (36.8 C) (Oral)  Resp 16  Ht 5' 0.25" (1.53 m)  Wt 193 lb (87.544 kg)  BMI 37.40 kg/m2  SpO2 92%  Physical Exam   General Appearance:    Alert, cooperative, no distress, appears stated age  Head:    Normocephalic, without obvious abnormality, atraumatic  Eyes:    PERRL, conjunctiva/corneas clear, EOM's intact, fundi    benign, both eyes  Ears:    Normal TM's and external ear canals, both ears  Nose:   Nares normal, septum midline, mucosa normal, no drainage  or sinus tenderness  Throat:   Lips, mucosa, and tongue normal; teeth and gums normal  Neck:   Supple, symmetrical, trachea midline, no adenopathy;    thyroid:  no enlargement/tenderness/nodules; no carotid   bruit or JVD  Back:     Symmetric, no curvature, ROM normal, no CVA tenderness  Lungs:     Faint basiilar crackles, respirations unlabored  Chest Wall:    No tenderness or deformity   Heart:    Regular rate and rhythm, S1 and S2 normal, no murmur, rub   or gallop  Breast Exam:    deferred  Abdomen:     Soft, non-tender, bowel sounds active all four quadrants,    no masses, no organomegaly  Pelvic:    deferred  Extremities:   Extremities normal, atraumatic, no cyanosis or edema  Pulses:   2+ and symmetric all extremities  Skin:   Skin color, texture, turgor normal, no rashes or lesions  Lymph nodes:   Cervical, supraclavicular, and axillary nodes normal  Neurologic:   CNII-XII intact, normal strength, sensation and reflexes    throughout    Activities of Daily Living In your present state of health, do you have any difficulty performing the following activities: 04/14/2016 12/01/2015  Hearing? Y N  Vision? N N  Difficulty concentrating or making decisions? Y N  Walking or climbing stairs? Y Y  Dressing or bathing? Y N  Doing errands, shopping? Y Y    Fall Risk Assessment Fall Risk  04/14/2016  Falls in the past year?  Yes  Number falls in past yr: 1  Injury with Fall? Yes  Follow up Education provided     Depression Screen PHQ 2/9 Scores 04/14/2016 06/29/2015  PHQ - 2 Score 2 0  PHQ- 9 Score 6 -    Cognitive Testing - 6-CIT  Correct? Score   What year is it? yes 0 0 or 4  What month is it? yes 0 0 or 3  Memorize:    Pia Mau,  44,  [place],      What time is it? (within 1 hour) yes 0 0 or 3  Count backwards from 20 yes 0 0, 2, or 4  Name the months of the year yes 0 0, 2, or 4  Repeat name & address above yes 0 0, 2, 4, 6, 8, or 10       TOTAL SCORE  0/28   Interpretation:  Normal  Normal (0-7) Abnormal (8-28)    Current Exercise Habits: The patient does not participate in regular exercise at present Exercise limited by: Other - see comments;respiratory conditions(s) (problems with gait)   Audit-C Alcohol Use Screening  Question Answer Points  How often do you have alcoholic drink? never 0  On days you do drink alcohol, how many drinks do you typically consume? n/a 0  How oftey will you drink 6 or more in a total? never 0  Total Score:  0   A score of 3 or more in women, and 4 or more in men indicates increased risk for alcohol abuse, EXCEPT if all of the points are from question 1.    Assessment & Plan:     Annual Wellness Visit  Reviewed patient's Family Medical History Reviewed and updated list of patient's medical providers Assessment of cognitive impairment was done Assessed patient's functional ability Established a written schedule for health screening [place] Completed and Reviewed  Exercise Activities and Dietary recommendations  Goals    None      Immunization History  Administered Date(s) Administered  . Influenza, High Dose Seasonal PF 06/29/2015  . Pneumococcal Conjugate-13 09/09/2014  . Pneumococcal Polysaccharide-23 09/07/2003  . Tdap 08/16/2012  . Zoster 08/16/2012    Health Maintenance  Topic Date Due  . DEXA  SCAN  12/20/2002  . INFLUENZA VACCINE  05/10/2016  . TETANUS/TDAP  08/16/2022  . ZOSTAVAX  Completed  . PNA vac Low Risk Adult  Completed      Discussed health benefits of physical activity, and encouraged her to engage in regular exercise appropriate for her age and condition.    ------------------------------------------------------------------------------------------------------------  1. Medicare annual wellness visit, subsequent   2. Other allergic rhinitis Well controlled.  Continue current medications.    3. Chronic kidney disease (CKD), stage III (moderate)  - Renal function panel  4. Chronic obstructive pulmonary disease, unspecified COPD type ([place]) Continue current inhalers.   5. Essential (primary) hypertension Well controlled. Continue current medications.   - Lipid panel - Renal function panel  6. Other specified hypothyroidism  - TSH  7. Spinal stenosis of lumbar region without neurogenic claudication Doing fairly well on current pain medications. Continue unchanged.   8. Hyperlipidemia She is tolerating simvastatin well with no adverse effects.   - Hepatic function panel - CBC  9. Dyspnea on exertion Chest XR  10. Cough Chest XR show   Dg Chest 2 View  04/14/2016   IMPRESSION: 1. Right upper lobe pneumonia. The lingula are or lateral left lower lobe pneumonia or atelectasis. 2. Cardiomegaly without definite pulmonary vascular engorgement. 3. Aortic atherosclerosis. Electronically Signed   By: David  [place] M.D.   On: 04/14/2016 12:34   -Levaquin 527m daily for 10 days.   - predniSONE (DELTASONE) 20 MG tablet; Take 1 tablet (20 mg total) by mouth 2 (two) times daily with a meal.  Dispense: 10 tablet; Refill: 0   DLelon Huh MD  [facility]

## 2016-04-14 NOTE — Patient Instructions (Signed)
Take furosemide twice a day until we receive results of your labs and xrays  Prescription for prednisone has been sent to your pharmacy

## 2016-04-14 NOTE — Progress Notes (Unsigned)
Please advise patient xray shows signs of pneumonia. Need to start levofloxacin 533m daily. rx has been sent to pharmacy.

## 2016-04-15 NOTE — Progress Notes (Signed)
Patient advised as below.  

## 2016-04-21 ENCOUNTER — Telehealth: Payer: Self-pay | Admitting: Family Medicine

## 2016-04-21 MED ORDER — HYDROCODONE-ACETAMINOPHEN 10-325 MG PO TABS: 1.0000 | ORAL_TABLET | ORAL | Status: DC | PRN

## 2016-04-21 NOTE — Telephone Encounter (Signed)
rf request

## 2016-04-21 NOTE — Telephone Encounter (Signed)
Please advise. Last OV 04/14/16 and last refill 03/21/16. Thanks.

## 2016-04-21 NOTE — Telephone Encounter (Signed)
Pt's daughter requesting a refill of HYDROcodone-acetaminophen (NORCO) 10-325 MG tablet for her mother.  States she would like to be contact as the home number is not the best number to call.

## 2016-05-10 ENCOUNTER — Other Ambulatory Visit: Payer: Self-pay | Admitting: Cardiovascular Disease

## 2016-05-10 DIAGNOSIS — G8929 Other chronic pain: Secondary | ICD-10-CM | POA: Diagnosis not present

## 2016-05-10 DIAGNOSIS — M542 Cervicalgia: Secondary | ICD-10-CM | POA: Diagnosis not present

## 2016-05-10 DIAGNOSIS — M25511 Pain in right shoulder: Secondary | ICD-10-CM | POA: Diagnosis not present

## 2016-05-23 ENCOUNTER — Other Ambulatory Visit: Payer: Self-pay | Admitting: Emergency Medicine

## 2016-05-23 MED ORDER — HYDROCODONE-ACETAMINOPHEN 10-325 MG PO TABS: 1.0000 | ORAL_TABLET | ORAL | 0 refills | Status: DC | PRN

## 2016-05-23 MED ORDER — CLOPIDOGREL BISULFATE 75 MG PO TABS: 75.0000 mg | ORAL_TABLET | ORAL | 5 refills | Status: DC

## 2016-05-23 NOTE — Telephone Encounter (Signed)
Pt daughter called requesting a refill on pts Plavix 75 mg and Hydrocodone/APAP 10/325 mg. She is completely out of the Plavix. Please advise.   [place].

## 2016-06-10 ENCOUNTER — Other Ambulatory Visit: Payer: Self-pay | Admitting: Family Medicine

## 2016-06-11 NOTE — Telephone Encounter (Signed)
Please check with patient regarding request for refills of Diltiazem. According to Dr. Gwenyth Ober notes on 03-08-16 this medication was discontinued due to swelling.

## 2016-06-25 ENCOUNTER — Emergency Department: Payer: Medicare Other

## 2016-06-25 ENCOUNTER — Emergency Department
Admission: EM | Admit: 2016-06-25 | Discharge: 2016-06-25 | Disposition: A | Discharge status: Home / Self Care | Payer: Medicare Other | Attending: Emergency Medicine | Admitting: Emergency Medicine

## 2016-06-25 ENCOUNTER — Encounter: Payer: Self-pay | Admitting: Emergency Medicine

## 2016-06-25 DIAGNOSIS — S52201A Unspecified fracture of shaft of right ulna, initial encounter for closed fracture: Secondary | ICD-10-CM

## 2016-06-25 DIAGNOSIS — E039 Hypothyroidism, unspecified: Secondary | ICD-10-CM | POA: Diagnosis not present

## 2016-06-25 DIAGNOSIS — N183 Chronic kidney disease, stage 3 (moderate): Secondary | ICD-10-CM | POA: Insufficient documentation

## 2016-06-25 DIAGNOSIS — Z96652 Presence of left artificial knee joint: Secondary | ICD-10-CM | POA: Insufficient documentation

## 2016-06-25 DIAGNOSIS — Y92002 Bathroom of unspecified non-institutional (private) residence single-family (private) house as the place of occurrence of the external cause: Secondary | ICD-10-CM | POA: Diagnosis not present

## 2016-06-25 DIAGNOSIS — M47896 Other spondylosis, lumbar region: Secondary | ICD-10-CM | POA: Diagnosis not present

## 2016-06-25 DIAGNOSIS — I129 Hypertensive chronic kidney disease with stage 1 through stage 4 chronic kidney disease, or unspecified chronic kidney disease: Secondary | ICD-10-CM | POA: Insufficient documentation

## 2016-06-25 DIAGNOSIS — M7989 Other specified soft tissue disorders: Secondary | ICD-10-CM | POA: Diagnosis not present

## 2016-06-25 DIAGNOSIS — Z7982 Long term (current) use of aspirin: Secondary | ICD-10-CM | POA: Insufficient documentation

## 2016-06-25 DIAGNOSIS — Z87891 Personal history of nicotine dependence: Secondary | ICD-10-CM | POA: Insufficient documentation

## 2016-06-25 DIAGNOSIS — J45909 Unspecified asthma, uncomplicated: Secondary | ICD-10-CM | POA: Insufficient documentation

## 2016-06-25 DIAGNOSIS — M79631 Pain in right forearm: Secondary | ICD-10-CM | POA: Diagnosis not present

## 2016-06-25 DIAGNOSIS — Y999 Unspecified external cause status: Secondary | ICD-10-CM | POA: Diagnosis not present

## 2016-06-25 DIAGNOSIS — S3992XA Unspecified injury of lower back, initial encounter: Secondary | ICD-10-CM | POA: Diagnosis not present

## 2016-06-25 DIAGNOSIS — J449 Chronic obstructive pulmonary disease, unspecified: Secondary | ICD-10-CM | POA: Diagnosis not present

## 2016-06-25 DIAGNOSIS — S59911A Unspecified injury of right forearm, initial encounter: Secondary | ICD-10-CM | POA: Diagnosis present

## 2016-06-25 DIAGNOSIS — S52614A Nondisplaced fracture of right ulna styloid process, initial encounter for closed fracture: Secondary | ICD-10-CM | POA: Diagnosis not present

## 2016-06-25 DIAGNOSIS — M479 Spondylosis, unspecified: Secondary | ICD-10-CM | POA: Diagnosis not present

## 2016-06-25 DIAGNOSIS — W1839XA Other fall on same level, initial encounter: Secondary | ICD-10-CM | POA: Insufficient documentation

## 2016-06-25 DIAGNOSIS — Y9389 Activity, other specified: Secondary | ICD-10-CM | POA: Insufficient documentation

## 2016-06-25 DIAGNOSIS — M545 Low back pain: Secondary | ICD-10-CM | POA: Diagnosis not present

## 2016-06-25 MED ORDER — HYDROMORPHONE HCL 1 MG/ML IJ SOLN
0.5000 mg | Freq: Once | INTRAMUSCULAR | Status: AC
  Administered 2016-06-25: 0.5 mg via INTRAMUSCULAR
  Filled 2016-06-25: qty 1

## 2016-06-25 NOTE — ED Notes (Signed)
Pt verbalized understanding of discharge instructions. NAD at this time. 

## 2016-06-25 NOTE — ED Triage Notes (Signed)
Pt to ed with c/o back pain and left leg pain last week and then she fell on Wednesday.  States she was getting up from toilet and fell.  Now with right wrist and arm pain.  Right head trauma, denies loss of consciousness, although she denies pain in head now.

## 2016-06-25 NOTE — ED Provider Notes (Signed)
[facility] Emergency Department Provider Note   ____________________________________________   None    (approximate)  I have reviewed the triage vital signs and the nursing notes.   HISTORY  Chief Complaint Back Pain    HPI Carolyn Hendricks is a 80 y.o. female patient complained of right forearm and wrist pain, low back pain and left leg pain secondary to a fall which occurred 3 days ago. Patient states she was getting up from the toilet and fell. Patient state her back pain is a constant pain but made worse with the fall. Patient states she hit her head but denies loss of consciousness. He denies any headache, vision disturbance, or vertigo. Patient denies loss of bladder or bowel control. Patient state her current pain medications consisting of Norco 10-3 25 mg helped with her pain in her back. Patient is also state most of her bruising is secondary to her being on Plavix. Patient is rating her pain overall as a 10 over 10.   Past Medical History:  Diagnosis Date  . Arthritis   . Chest pain   . Chronic kidney disease   . Diastolic dysfunction   . Dizziness   . Head ache   . Heart murmur   . High blood pressure   . History of MRSA infection   . Shortness of breath     Patient Active Problem List   Diagnosis Date Noted  . S/P laparoscopic cholecystectomy 12/01/2015  . Umbilical hernia without obstruction and without gangrene   . COPD (chronic obstructive pulmonary disease) ([place]) 10/22/2015  . Peripheral vascular disease ([place]) 10/15/2015  . Difficulty in walking 10/15/2015  . Airway hyperreactivity 10/15/2015  . Gallstone pancreatitis   . MRSA cellulitis 06/29/2015  . SOB (shortness of breath)   . LBBB (left bundle branch block)   . Angina pectoris ([place]) 05/14/2015  . Leg swelling 05/14/2015  . Allergic rhinitis 04/17/2015  . Anxiety 04/17/2015  . Arthralgia of hand 04/17/2015  . Chronic kidney disease (CKD), stage III (moderate)  04/17/2015  . Fatigue 04/17/2015  . Numbness and tingling 04/17/2015  . Nonspecific abnormal finding in stool contents 04/17/2015  . Restless leg 04/17/2015  . Hypothyroidism 07/09/2014  . Abnormal mammogram 11/01/2012  . Carotid stenosis 03/28/2012  . Atrial tachycardia ([place]) 03/28/2012  . Hyperlipidemia 03/28/2012  . Carotid artery obstruction 03/28/2012  . Supraventricular tachycardia ([place]) 03/28/2012  . Hyperpotassemia 02/27/2010  . Heart murmur 03/10/2009  . Insomnia 03/05/2009  . Essential (primary) hypertension 01/29/2009  . Goiter 01/29/2009  . History of tobacco use 01/29/2009  . Spinal stenosis of lumbar region without neurogenic claudication 07/14/2003    Past Surgical History:  Procedure Laterality Date  . APPENDECTOMY  1950  . CARDIAC CATHETERIZATION N/A 06/12/2015   Procedure: Left Heart Cath and Coronary Angiography;  Surgeon: Minna Merritts, MD;  Location: [place];  Service: Cardiovascular;  Laterality: N/A;  . CAROTID ENDARTERECTOMY Left Dr. Lucky Cowboy  . CHOLECYSTECTOMY N/A 12/01/2015   Procedure: LAPAROSCOPIC CHOLECYSTECTOMY WITH INTRAOPERATIVE CHOLANGIOGRAM;  Surgeon: Hubbard Robinson, MD;  Location: [facility];  Service: General;  Laterality: N/A;  . foot and ankle surgery    . FRACTURE SURGERY    . JOINT REPLACEMENT    . knee replacement (other) Left 2002    Prior to Admission medications   Medication Sig Start Date End Date Taking? Authorizing Provider  Aclidinium Bromide (TUDORZA PRESSAIR) 400 MCG/ACT AEPB Inhale 1 puff into the lungs 2 (two) times daily. Patient  taking differently: Inhale 1 puff into the lungs 2 (two) times daily. As needed. 10/22/15   Birdie Sons, MD  aspirin 81 MG tablet Take 81 mg by mouth every morning. Reported on 01/07/2016    Historical Provider, MD  cetirizine (ZYRTEC) 10 MG tablet Take 10 mg by mouth at bedtime.     Historical Provider, MD  clopidogrel (PLAVIX) 75 MG tablet Take 1 tablet (75 mg total) by mouth every  morning. 05/23/16   Birdie Sons, MD  cyanocobalamin (,VITAMIN B-12,) 1000 MCG/ML injection 1 ml injected subcutaneously once a month 01/07/16   Birdie Sons, MD  Ferrous Sulfate (IRON) 325 (65 Fe) MG TABS Take by mouth.    Historical Provider, MD  fluticasone (FLONASE) 50 MCG/ACT nasal spray INHALE ONE TO TWO SPRAY IN EACH NOSTRIL DAILY AS NEEDED 09/17/15   Birdie Sons, MD  Fluticasone-Salmeterol (ADVAIR DISKUS) 250-50 MCG/DOSE AEPB Inhale 1 puff into the lungs every 12 (twelve) hours. 06/29/15   Birdie Sons, MD  furosemide (LASIX) 20 MG tablet Take 20 mg by mouth. Pt reports she does not take lasix everyday, my bladder is so small, it makes me go to the bathroom a lot. 09/26/15   Historical Provider, MD  HYDROcodone-acetaminophen (NORCO) 10-325 MG tablet Take 1 tablet by mouth every 4 (four) hours as needed for severe pain. 05/23/16   Birdie Sons, MD  isosorbide mononitrate (IMDUR) 30 MG 24 hr tablet TAKE 1 TABLET BY MOUTH DAILY FOR HEART 05/10/16   Minna Merritts, MD  levocetirizine (XYZAL) 5 MG tablet Take 5 mg by mouth every evening.    Historical Provider, MD  levothyroxine (SYNTHROID, LEVOTHROID) 75 MCG tablet TAKE 1 TABLET BY MOUTH DAILY (FOR THYROID) 04/13/16   Birdie Sons, MD  LORazepam (ATIVAN) 0.5 MG tablet Take 0.5 mg by mouth every 8 (eight) hours. As needed.    Historical Provider, MD  meloxicam (MOBIC) 15 MG tablet TAKE ONE (1) TABLET BY MOUTH DAILY AS NEEDED FOR ARTHRITIS PAIN 06/11/16   Birdie Sons, MD  metoprolol (LOPRESSOR) 100 MG tablet Take 1 tablet (100 mg total) by mouth 2 (two) times daily. 03/08/16   Minna Merritts, MD  Multiple Vitamin (MULTI-VITAMIN DAILY PO) Take by mouth.    Historical Provider, MD  mupirocin ointment (BACTROBAN) 2 % APPLY TO AFFECTED AREA(S) TWO TIMES PER DAY AS NEEDED 12/17/15   Birdie Sons, MD  nitroGLYCERIN (NITROSTAT) 0.4 MG SL tablet Place 1 tablet (0.4 mg total) under the tongue every 5 (five) minutes as needed for chest pain.  05/14/15   Minna Merritts, MD  pantoprazole (PROTONIX) 40 MG tablet Take 1 tablet (40 mg total) by mouth daily. Patient taking differently: Take 40 mg by mouth every morning.  10/02/15   Demetrios Loll, MD  sertraline (ZOLOFT) 50 MG tablet TAKE 0.5 TO 1 TABLET BY MOUTH DAILY FOR MOOD 02/15/16   Birdie Sons, MD  simvastatin (ZOCOR) 20 MG tablet TAKE 1 TABLET BY MOUTH DAILY AT 6PM. 04/13/16   Minna Merritts, MD  sodium bicarbonate 650 MG tablet Take 650 mg by mouth 3 (three) times daily.    Historical Provider, MD  traZODone (DESYREL) 100 MG tablet TAKE ONE-HALF OR ONE TABLET BY MOUTH AT BEDTIME AS NEEDED FOR SLEEP 06/11/16   Birdie Sons, MD  valsartan-hydrochlorothiazide (DIOVAN-HCT) 320-12.5 MG per tablet Take 1 tablet by mouth every morning.     Historical Provider, MD    Allergies Atorvastatin; Crestor  [  rosuvastatin calcium]; Pravastatin sodium; and Tape  Family History  Problem Relation Age of Onset  . Congestive Heart Failure Mother 68  . Cancer Brother   . Kidney failure Father     Social History Social History  Substance Use Topics  . Smoking status: Former Smoker    Packs/day: 1.00    Years: 26.00    Types: Cigarettes    Quit date: 10/11/2011  . Smokeless tobacco: Never Used     Comment: Patient has smoked for 26 years; has quit but now started back 0.25 ppw.  . Alcohol use No    Review of Systems Constitutional: No fever/chills Eyes: No visual changes. ENT: No sore throat. Cardiovascular: Denies chest pain. Respiratory: Denies shortness of breath. Gastrointestinal: No abdominal pain.  No nausea, no vomiting.  No diarrhea.  No constipation. Endocrine:Hypertension, hyperlipidemia  and chronic kidney disease. Hematological/Lymphatic: Allergic/Immunilogical: See medication list Musculoskeletal: Positive for back pain. Right wrist and forearm pain. Skin: Negative for rash. Neurological: Negative for headaches, focal weakness or  numbness.    ____________________________________________   PHYSICAL EXAM:  VITAL SIGNS: ED Triage Vitals  Enc Vitals Group     BP 06/25/16 0950 (!) 115/56     Pulse Rate 06/25/16 0950 67     Resp 06/25/16 0950 16     Temp 06/25/16 0950 98.9 F (37.2 C)     Temp Source 06/25/16 0950 Oral     SpO2 06/25/16 0950 90 %     Weight 06/25/16 0951 185 lb (83.9 kg)     Height 06/25/16 0951 5' (1.524 m)     Head Circumference --      Peak Flow --      Pain Score 06/25/16 1005 10     Pain Loc --      Pain Edu? --      Excl. in [place]? --     Constitutional: Alert and oriented. Well appearing and in no acute distress. Eyes: Conjunctivae are normal. PERRL. EOMI. Head: Atraumatic. Nose: No congestion/rhinnorhea. Mouth/Throat: Mucous membranes are moist.  Oropharynx non-erythematous. Neck: No stridor.  No cervical spine tenderness to palpation. Hematological/Lymphatic/Immunilogical: No cervical lymphadenopathy. Cardiovascular: Normal rate, regular rhythm. Grossly normal heart sounds.  Good peripheral circulation. Respiratory: Normal respiratory effort.  No retractions. Lungs CTAB. Gastrointestinal: Soft and nontender. No distention. No abdominal bruits. No CVA tenderness. Musculoskeletal: No lower extremity tenderness nor edema.  No joint effusions. Neurologic:  Normal speech and language. No gross focal neurologic deficits are appreciated. No gait instability. Skin:  Skin is warm, dry and intact. No rash noted. Psychiatric: Mood and affect are normal. Speech and behavior are normal.  ____________________________________________   LABS (all labs ordered are listed, but only abnormal results are displayed)  Labs Reviewed - No data to display ____________________________________________  EKG   ____________________________________________  RADIOLOGY  X-rays remarkable for right ulnar stylus fracture which is nondisplaced. X-rays also reveal increased height loss at L1, L2, L3  vertebral bodies which is progressed from previous exam at 2012. ____________________________________________   PROCEDURES  Procedure(s) performed: None  Procedures  Critical Care performed: No  ____________________________________________   INITIAL IMPRESSION / ASSESSMENT AND PLAN / ED COURSE  Pertinent labs & imaging results that were available during my care of the patient were reviewed by me and considered in my medical decision making (see chart for details).  Right distal ulnar fracture and progressive height loss of vertebral bodies L1-L3. Discussed x-ray findings with patient. Patient placed in a Velcro wrist splint. Patient advised  to follow orthopedics clinic and discussed her x-ray findings with family doctor. Patient advised to continue home pain medications. Clinical Course     ____________________________________________   FINAL CLINICAL IMPRESSION(S) / ED DIAGNOSES  Final diagnoses:  Ulnar fracture, right, closed, initial encounter  Other osteoarthritis of spine, lumbar region      NEW MEDICATIONS STARTED DURING THIS VISIT:  New Prescriptions   No medications on file     Note:  This document was prepared using Dragon voice recognition software and may include unintentional dictation errors.    Sable Feil, PA-C 06/25/16 [place], MD 06/25/16 [id number]

## 2016-06-25 NOTE — Discharge Instructions (Signed)
Wear splint until evaluation by orthopedics clinic.

## 2016-06-25 NOTE — ED Notes (Signed)
Pt fell Wednesday and hit head. No LOC. On plavix. C/o pain to lower back down left leg which started prior to fall. Has had nausea no vomiting. Swelling and bruising noted to right forearm and hand. Able to move. Has lost bowel and bladder but only because cannot get to bathroom in time.

## 2016-06-27 ENCOUNTER — Other Ambulatory Visit: Payer: Self-pay | Admitting: Family Medicine

## 2016-06-27 MED ORDER — HYDROCODONE-ACETAMINOPHEN 10-325 MG PO TABS: 1.0000 | ORAL_TABLET | ORAL | 0 refills | Status: DC | PRN

## 2016-06-27 NOTE — Telephone Encounter (Signed)
Pt's daughter Hilda Blades contacted office for refill request on the following medications: HYDROcodone-acetaminophen (NORCO) 10-325 MG tablet  Last written: 05/23/16 Last OV: 04/14/16 Please advise. Thanks TNP

## 2016-06-29 DIAGNOSIS — S52501A Unspecified fracture of the lower end of right radius, initial encounter for closed fracture: Secondary | ICD-10-CM | POA: Diagnosis not present

## 2016-06-30 ENCOUNTER — Encounter (HOSPITAL_COMMUNITY): Payer: Self-pay | Admitting: Emergency Medicine

## 2016-06-30 ENCOUNTER — Emergency Department (HOSPITAL_COMMUNITY): Payer: Medicare Other

## 2016-06-30 ENCOUNTER — Inpatient Hospital Stay (HOSPITAL_COMMUNITY)
Admission: EM | Admit: 2016-06-30 | Discharge: 2016-07-05 | DRG: 091 | Disposition: A | Discharge status: Skilled Nursing Facility | Payer: Medicare Other | Attending: Internal Medicine | Admitting: Internal Medicine

## 2016-06-30 DIAGNOSIS — Z7982 Long term (current) use of aspirin: Secondary | ICD-10-CM

## 2016-06-30 DIAGNOSIS — E872 Acidosis: Secondary | ICD-10-CM | POA: Diagnosis present

## 2016-06-30 DIAGNOSIS — Z8614 Personal history of Methicillin resistant Staphylococcus aureus infection: Secondary | ICD-10-CM

## 2016-06-30 DIAGNOSIS — J9601 Acute respiratory failure with hypoxia: Secondary | ICD-10-CM | POA: Diagnosis not present

## 2016-06-30 DIAGNOSIS — J9602 Acute respiratory failure with hypercapnia: Secondary | ICD-10-CM | POA: Diagnosis present

## 2016-06-30 DIAGNOSIS — N179 Acute kidney failure, unspecified: Secondary | ICD-10-CM | POA: Diagnosis present

## 2016-06-30 DIAGNOSIS — R0902 Hypoxemia: Secondary | ICD-10-CM | POA: Diagnosis not present

## 2016-06-30 DIAGNOSIS — N189 Chronic kidney disease, unspecified: Secondary | ICD-10-CM

## 2016-06-30 DIAGNOSIS — R404 Transient alteration of awareness: Secondary | ICD-10-CM | POA: Diagnosis not present

## 2016-06-30 DIAGNOSIS — I447 Left bundle-branch block, unspecified: Secondary | ICD-10-CM | POA: Diagnosis present

## 2016-06-30 DIAGNOSIS — G8929 Other chronic pain: Secondary | ICD-10-CM | POA: Diagnosis present

## 2016-06-30 DIAGNOSIS — R531 Weakness: Secondary | ICD-10-CM | POA: Diagnosis not present

## 2016-06-30 DIAGNOSIS — R410 Disorientation, unspecified: Secondary | ICD-10-CM | POA: Diagnosis not present

## 2016-06-30 DIAGNOSIS — Z96652 Presence of left artificial knee joint: Secondary | ICD-10-CM | POA: Diagnosis present

## 2016-06-30 DIAGNOSIS — Z87891 Personal history of nicotine dependence: Secondary | ICD-10-CM

## 2016-06-30 DIAGNOSIS — R0600 Dyspnea, unspecified: Secondary | ICD-10-CM

## 2016-06-30 DIAGNOSIS — T402X5A Adverse effect of other opioids, initial encounter: Secondary | ICD-10-CM | POA: Diagnosis present

## 2016-06-30 DIAGNOSIS — E860 Dehydration: Secondary | ICD-10-CM | POA: Diagnosis present

## 2016-06-30 DIAGNOSIS — J189 Pneumonia, unspecified organism: Secondary | ICD-10-CM | POA: Diagnosis not present

## 2016-06-30 DIAGNOSIS — G9340 Encephalopathy, unspecified: Secondary | ICD-10-CM

## 2016-06-30 DIAGNOSIS — E039 Hypothyroidism, unspecified: Secondary | ICD-10-CM | POA: Diagnosis present

## 2016-06-30 DIAGNOSIS — I5032 Chronic diastolic (congestive) heart failure: Secondary | ICD-10-CM | POA: Diagnosis present

## 2016-06-30 DIAGNOSIS — I10 Essential (primary) hypertension: Secondary | ICD-10-CM | POA: Diagnosis present

## 2016-06-30 DIAGNOSIS — Z7951 Long term (current) use of inhaled steroids: Secondary | ICD-10-CM

## 2016-06-30 DIAGNOSIS — Z79899 Other long term (current) drug therapy: Secondary | ICD-10-CM

## 2016-06-30 DIAGNOSIS — G92 Toxic encephalopathy: Secondary | ICD-10-CM | POA: Diagnosis not present

## 2016-06-30 DIAGNOSIS — E875 Hyperkalemia: Secondary | ICD-10-CM | POA: Diagnosis present

## 2016-06-30 DIAGNOSIS — N184 Chronic kidney disease, stage 4 (severe): Secondary | ICD-10-CM | POA: Diagnosis present

## 2016-06-30 DIAGNOSIS — R918 Other nonspecific abnormal finding of lung field: Secondary | ICD-10-CM | POA: Diagnosis not present

## 2016-06-30 DIAGNOSIS — R740 Nonspecific elevation of levels of transaminase and lactic acid dehydrogenase [LDH]: Secondary | ICD-10-CM | POA: Diagnosis present

## 2016-06-30 DIAGNOSIS — I130 Hypertensive heart and chronic kidney disease with heart failure and stage 1 through stage 4 chronic kidney disease, or unspecified chronic kidney disease: Secondary | ICD-10-CM | POA: Diagnosis present

## 2016-06-30 DIAGNOSIS — I2510 Atherosclerotic heart disease of native coronary artery without angina pectoris: Secondary | ICD-10-CM | POA: Diagnosis present

## 2016-06-30 DIAGNOSIS — E785 Hyperlipidemia, unspecified: Secondary | ICD-10-CM | POA: Diagnosis present

## 2016-06-30 DIAGNOSIS — R4182 Altered mental status, unspecified: Secondary | ICD-10-CM

## 2016-06-30 DIAGNOSIS — J440 Chronic obstructive pulmonary disease with acute lower respiratory infection: Secondary | ICD-10-CM | POA: Diagnosis present

## 2016-06-30 DIAGNOSIS — Z7902 Long term (current) use of antithrombotics/antiplatelets: Secondary | ICD-10-CM

## 2016-06-30 DIAGNOSIS — Z9049 Acquired absence of other specified parts of digestive tract: Secondary | ICD-10-CM

## 2016-06-30 DIAGNOSIS — D631 Anemia in chronic kidney disease: Secondary | ICD-10-CM | POA: Diagnosis present

## 2016-06-30 NOTE — ED Notes (Addendum)
Patient transported to X-ray, CT

## 2016-06-30 NOTE — ED Provider Notes (Signed)
[facility] DEPT Provider Note   CSN: [id number] Arrival date & time: 06/30/16  2245  By signing my name below, I, Maud Deed. Royston Sinner, attest that this documentation has been prepared under the direction and in the presence of Everlene Balls, MD.  Electronically Signed: Maud Deed. Royston Sinner, ED Scribe. 06/30/16. 11:34 PM.    History   Chief Complaint Chief Complaint  Patient presents with  . Shortness of Breath  . Stroke Symptoms   HPI  LEVEL 5 CAVEAT DUE TO ALTERED MENTAL STATUS   HPI Comments: Carolyn Hendricks, brought in by EMS from home is a 80 y.o. female with a PMHx of CKD, COPD, and HTN who presents to the Emergency Department here for altered mental status this evening. Family states pt sustained a fall last Wednesday 9/13 and was placed in a hard cast.  Pt was evaluated at [facility] after fall where CT scan performed. Prescribed pain medication given for break through pain. Pt was safely discharged home with return precautions. Since time of fall family states pt has become more confused.  Visual hallucinations reported as family states pt has reported seeing her husband who passed away several years ago. Daughter states she has not spoken and does not respond to questions which is not normal for her. She also reports more difficulty with ambulation, mild shortness of breath. Upon EMS arrival, pt was 85% on RA, pt was placed on 4 liters of oxygen via [place] which improved oxygen saturations to 99%. She is currently on Plavix daily. However, no other new medications or recent medication changes.  PCP: [name], MD    Past Medical History:  Diagnosis Date  . Arthritis   . Chest pain   . Chronic kidney disease   . Diastolic dysfunction   . Dizziness   . Head ache   . Heart murmur   . High blood pressure   . History of MRSA infection   . Shortness of breath     Patient Active Problem List   Diagnosis Date Noted  . Hypoxia 07/01/2016  . S/P laparoscopic cholecystectomy  12/01/2015  . Umbilical hernia without obstruction and without gangrene   . COPD (chronic obstructive pulmonary disease) ([place]) 10/22/2015  . Peripheral vascular disease ([place]) 10/15/2015  . Difficulty in walking 10/15/2015  . Airway hyperreactivity 10/15/2015  . Gallstone pancreatitis   . MRSA cellulitis 06/29/2015  . SOB (shortness of breath)   . LBBB (left bundle branch block)   . Angina pectoris ([place]) 05/14/2015  . Leg swelling 05/14/2015  . Allergic rhinitis 04/17/2015  . Anxiety 04/17/2015  . Arthralgia of hand 04/17/2015  . Chronic kidney disease (CKD), stage III (moderate) 04/17/2015  . Fatigue 04/17/2015  . Numbness and tingling 04/17/2015  . Nonspecific abnormal finding in stool contents 04/17/2015  . Restless leg 04/17/2015  . Hypothyroidism 07/09/2014  . Abnormal mammogram 11/01/2012  . Carotid stenosis 03/28/2012  . Atrial tachycardia ([place]) 03/28/2012  . Hyperlipidemia 03/28/2012  . Carotid artery obstruction 03/28/2012  . Supraventricular tachycardia ([place]) 03/28/2012  . Hyperpotassemia 02/27/2010  . Heart murmur 03/10/2009  . Insomnia 03/05/2009  . Essential (primary) hypertension 01/29/2009  . Goiter 01/29/2009  . History of tobacco use 01/29/2009  . Spinal stenosis of lumbar region without neurogenic claudication 07/14/2003    Past Surgical History:  Procedure Laterality Date  . APPENDECTOMY  1950  . CARDIAC CATHETERIZATION N/A 06/12/2015   Procedure: Left Heart Cath and Coronary Angiography;  Surgeon: Minna Merritts, MD;  Location: [facility]  [facility];  Service: Cardiovascular;  Laterality: N/A;  . CAROTID ENDARTERECTOMY Left Dr. Lucky Cowboy  . CHOLECYSTECTOMY N/A 12/01/2015   Procedure: LAPAROSCOPIC CHOLECYSTECTOMY WITH INTRAOPERATIVE CHOLANGIOGRAM;  Surgeon: Hubbard Robinson, MD;  Location: [facility] ORS;  Service: General;  Laterality: N/A;  . foot and ankle surgery    . FRACTURE SURGERY    . JOINT REPLACEMENT    . knee replacement (other) Left 2002    OB  History    Gravida Para Term Preterm AB Living   5 4           SAB TAB Ectopic Multiple Live Births                   Home Medications    Prior to Admission medications   Medication Sig Start Date End Date Taking? Authorizing Provider  Aclidinium Bromide (TUDORZA PRESSAIR) 400 MCG/ACT AEPB Inhale 1 puff into the lungs 2 (two) times daily. Patient taking differently: Inhale 1 puff into the lungs 2 (two) times daily. As needed. 10/22/15   Birdie Sons, MD  aspirin 81 MG tablet Take 81 mg by mouth every morning. Reported on 01/07/2016    Historical Provider, MD  cetirizine (ZYRTEC) 10 MG tablet Take 10 mg by mouth at bedtime.     Historical Provider, MD  clopidogrel (PLAVIX) 75 MG tablet Take 1 tablet (75 mg total) by mouth every morning. 05/23/16   Birdie Sons, MD  cyanocobalamin (,VITAMIN B-12,) 1000 MCG/ML injection 1 ml injected subcutaneously once a month 01/07/16   Birdie Sons, MD  Ferrous Sulfate (IRON) 325 (65 Fe) MG TABS Take by mouth.    Historical Provider, MD  fluticasone (FLONASE) 50 MCG/ACT nasal spray INHALE ONE TO TWO SPRAY IN EACH NOSTRIL DAILY AS NEEDED 09/17/15   Birdie Sons, MD  Fluticasone-Salmeterol (ADVAIR DISKUS) 250-50 MCG/DOSE AEPB Inhale 1 puff into the lungs every 12 (twelve) hours. 06/29/15   Birdie Sons, MD  furosemide (LASIX) 20 MG tablet Take 20 mg by mouth. Pt reports she does not take lasix everyday, my bladder is so small, it makes me go to the bathroom a lot. 09/26/15   Historical Provider, MD  HYDROcodone-acetaminophen (NORCO) 10-325 MG tablet Take 1 tablet by mouth every 4 (four) hours as needed for severe pain. 06/27/16   Birdie Sons, MD  isosorbide mononitrate (IMDUR) 30 MG 24 hr tablet TAKE 1 TABLET BY MOUTH DAILY FOR HEART 05/10/16   Minna Merritts, MD  levocetirizine (XYZAL) 5 MG tablet Take 5 mg by mouth every evening.    Historical Provider, MD  levothyroxine (SYNTHROID, LEVOTHROID) 75 MCG tablet TAKE 1 TABLET BY MOUTH DAILY (FOR  THYROID) 04/13/16   Birdie Sons, MD  LORazepam (ATIVAN) 0.5 MG tablet Take 0.5 mg by mouth every 8 (eight) hours. As needed.    Historical Provider, MD  meloxicam (MOBIC) 15 MG tablet TAKE ONE (1) TABLET BY MOUTH DAILY AS NEEDED FOR ARTHRITIS PAIN 06/11/16   Birdie Sons, MD  metoprolol (LOPRESSOR) 100 MG tablet Take 1 tablet (100 mg total) by mouth 2 (two) times daily. 03/08/16   Minna Merritts, MD  Multiple Vitamin (MULTI-VITAMIN DAILY PO) Take by mouth.    Historical Provider, MD  mupirocin ointment (BACTROBAN) 2 % APPLY TO AFFECTED AREA(S) TWO TIMES PER DAY AS NEEDED 12/17/15   Birdie Sons, MD  nitroGLYCERIN (NITROSTAT) 0.4 MG SL tablet Place 1 tablet (0.4 mg total) under the tongue every 5 (five) minutes as needed  for chest pain. 05/14/15   Minna Merritts, MD  pantoprazole (PROTONIX) 40 MG tablet Take 1 tablet (40 mg total) by mouth daily. Patient taking differently: Take 40 mg by mouth every morning.  10/02/15   Demetrios Loll, MD  sertraline (ZOLOFT) 50 MG tablet TAKE 0.5 TO 1 TABLET BY MOUTH DAILY FOR MOOD 02/15/16   Birdie Sons, MD  simvastatin (ZOCOR) 20 MG tablet TAKE 1 TABLET BY MOUTH DAILY AT 6PM. 04/13/16   Minna Merritts, MD  sodium bicarbonate 650 MG tablet Take 650 mg by mouth 3 (three) times daily.    Historical Provider, MD  traZODone (DESYREL) 100 MG tablet TAKE ONE-HALF OR ONE TABLET BY MOUTH AT BEDTIME AS NEEDED FOR SLEEP 06/11/16   Birdie Sons, MD  valsartan-hydrochlorothiazide (DIOVAN-HCT) 320-12.5 MG per tablet Take 1 tablet by mouth every morning.     Historical Provider, MD    Family History Family History  Problem Relation Age of Onset  . Congestive Heart Failure Mother 63  . Cancer Brother   . Kidney failure Father     Social History Social History  Substance Use Topics  . Smoking status: Former Smoker    Packs/day: 1.00    Years: 26.00    Types: Cigarettes    Quit date: 10/11/2011  . Smokeless tobacco: Never Used     Comment: Patient has smoked for 26  years; has quit but now started back 0.25 ppw.  . Alcohol use No     Allergies   Atorvastatin; Crestor  [rosuvastatin calcium]; Pravastatin sodium; and Tape   Review of Systems Review of Systems  Unable to perform ROS: Mental status change     Physical Exam Updated Vital Signs BP 140/57   Pulse (!) 57   Temp 98 F (36.7 C) (Oral)   Resp 18   SpO2 97%   Physical Exam  Constitutional: She appears well-developed and well-nourished. No distress.  Pt is drowsy but arousable with verbal stimuli.  HENT:  Head: Normocephalic and atraumatic.  Nose: Nose normal.  Mouth/Throat: Oropharynx is clear and moist. No oropharyngeal exudate.  Eyes: Conjunctivae and EOM are normal. Pupils are equal, round, and reactive to light. No scleral icterus.  Neck: Normal range of motion. Neck supple. No JVD present. No tracheal deviation present. No thyromegaly present.  Cardiovascular: Normal rate, regular rhythm and normal heart sounds.  Exam reveals no gallop and no friction rub.   No murmur heard. Pulmonary/Chest: No respiratory distress. She has no wheezes. She exhibits no tenderness.  Shallow respirations  Abdominal: Soft. Bowel sounds are normal. She exhibits no distension and no mass. There is no tenderness. There is no rebound and no guarding.  Musculoskeletal: Normal range of motion. She exhibits edema. She exhibits no tenderness.  RUE is in a cast. Normal pulses and sensation. Bilateral LE edema.  Lymphadenopathy:    She has no cervical adenopathy.  Neurological: She is alert.  Skin: Skin is warm and dry. No rash noted. No erythema. No pallor.  Nursing note and vitals reviewed.    ED Treatments / Results   DIAGNOSTIC STUDIES: Oxygen Saturation is 100% on 3 liters of oxygen, normal by my interpretation.    COORDINATION OF CARE: 11:05 PM- Will order imaging, EKG, and blood work. Discussed treatment plan with pt at bedside and pt agreed to plan.     Labs (all labs ordered are  listed, but only abnormal results are displayed) Labs Reviewed  CBC WITH DIFFERENTIAL/PLATELET - Abnormal; Notable for  the following:       Result Value   Hemoglobin 8.6 (*)    HCT 33.5 (*)    MCH 21.8 (*)    MCHC 25.7 (*)    RDW 19.3 (*)    Neutro Abs 8.3 (*)    All other components within normal limits  COMPREHENSIVE METABOLIC PANEL - Abnormal; Notable for the following:    Potassium 5.5 (*)    Glucose, Bld 114 (*)    BUN 35 (*)    Creatinine, Ser 2.27 (*)    Calcium 8.5 (*)    Total Protein 6.4 (*)    AST 123 (*)    ALT 86 (*)    GFR calc non Af Amer 20 (*)    GFR calc Af Amer 23 (*)    All other components within normal limits  URINALYSIS, ROUTINE W REFLEX MICROSCOPIC (NOT AT [facility]) - Abnormal; Notable for the following:    Color, Urine AMBER (*)    APPearance HAZY (*)    Bilirubin Urine SMALL (*)    Protein, ur 100 (*)    All other components within normal limits  URINE RAPID DRUG SCREEN, HOSP PERFORMED - Abnormal; Notable for the following:    Opiates POSITIVE (*)    All other components within normal limits  URINE MICROSCOPIC-ADD ON - Abnormal; Notable for the following:    Squamous Epithelial / LPF 0-5 (*)    Bacteria, UA FEW (*)    Casts GRANULAR CAST (*)    All other components within normal limits  I-STAT CHEM 8, ED - Abnormal; Notable for the following:    Potassium 5.4 (*)    BUN 37 (*)    Creatinine, Ser 2.20 (*)    Glucose, Bld 108 (*)    Calcium, Ion 1.11 (*)    Hemoglobin 10.2 (*)    HCT 30.0 (*)    All other components within normal limits  URINE CULTURE  LIPASE, BLOOD  PROTIME-INR  MAGNESIUM  ETHANOL  I-STAT CG4 LACTIC ACID, ED  I-STAT TROPOININ, ED    EKG  EKG Interpretation None       Radiology No results found.  Procedures Procedures (including critical care time)  Medications Ordered in ED Medications - No data to display   Initial Impression / Assessment and Plan / ED Course  I have reviewed the triage vital signs and the  nursing notes.  Pertinent labs & imaging results that were available during my care of the patient were reviewed by me and considered in my medical decision making (see chart for details).  Clinical Course    Patient presents to the ED for AMS after cast placement for broken arm.  Family denies any medication changes and states she has been taking norco prior to fall.  Her exam is consistent with possible narcotic OD.  Will obtain labs and infectious work up as well.  Patient hypoxic on RA which is not normal for her.    3:44 AM Labs unremarkable  K slightly increased.  Cr has doubled from 1 to 2.  She was given IVF.  CXR shows possible pneumonia vs. Atelectasis.  History is not consistent with PNA.  She continues to be hypoxic.  Will admit to hospitalist for further care.  Final Clinical Impressions(s) / ED Diagnoses   Final diagnoses:  Hypoxia  Altered mental status, unspecified altered mental status type    New Prescriptions New Prescriptions   No medications on file     I personally  performed the services described in this documentation, which was scribed in my presence. The recorded information has been reviewed and is accurate.      Everlene Balls, MD 07/01/16 [phone]

## 2016-06-30 NOTE — ED Triage Notes (Signed)
Pt BIB EMS from home, where she lives with daughter. Per EMS, family noticed pt having incr'd difficulty ambulating & unsteady around 0800 today. Pt grew worse with slurred speech, and having conversation with deceased husband, around 54 today. For EMS, pt's SpO2 in the low 80s. Placed on 4L O2 via [place] and pt able to maintain 99%. Pt presents as sleepy with poor attention/concentration. Pt has hx of COPD, but not on O2 at home.

## 2016-06-30 NOTE — ED Notes (Signed)
When taken off O2, pt's SpO2 decr'd to 87%. Placed back on O2 via [place] at 3L.

## 2016-07-01 ENCOUNTER — Encounter (HOSPITAL_COMMUNITY): Payer: Self-pay | Admitting: Internal Medicine

## 2016-07-01 ENCOUNTER — Telehealth (HOSPITAL_BASED_OUTPATIENT_CLINIC_OR_DEPARTMENT_OTHER): Payer: Self-pay

## 2016-07-01 ENCOUNTER — Observation Stay (HOSPITAL_COMMUNITY): Payer: Medicare Other

## 2016-07-01 DIAGNOSIS — E875 Hyperkalemia: Secondary | ICD-10-CM | POA: Diagnosis not present

## 2016-07-01 DIAGNOSIS — J9601 Acute respiratory failure with hypoxia: Secondary | ICD-10-CM | POA: Diagnosis not present

## 2016-07-01 DIAGNOSIS — N184 Chronic kidney disease, stage 4 (severe): Secondary | ICD-10-CM | POA: Diagnosis present

## 2016-07-01 DIAGNOSIS — Z9049 Acquired absence of other specified parts of digestive tract: Secondary | ICD-10-CM | POA: Diagnosis not present

## 2016-07-01 DIAGNOSIS — R296 Repeated falls: Secondary | ICD-10-CM | POA: Diagnosis not present

## 2016-07-01 DIAGNOSIS — N179 Acute kidney failure, unspecified: Secondary | ICD-10-CM | POA: Diagnosis not present

## 2016-07-01 DIAGNOSIS — R1084 Generalized abdominal pain: Secondary | ICD-10-CM | POA: Diagnosis not present

## 2016-07-01 DIAGNOSIS — R740 Nonspecific elevation of levels of transaminase and lactic acid dehydrogenase [LDH]: Secondary | ICD-10-CM | POA: Diagnosis present

## 2016-07-01 DIAGNOSIS — Z7951 Long term (current) use of inhaled steroids: Secondary | ICD-10-CM | POA: Diagnosis not present

## 2016-07-01 DIAGNOSIS — E785 Hyperlipidemia, unspecified: Secondary | ICD-10-CM | POA: Diagnosis present

## 2016-07-01 DIAGNOSIS — J9602 Acute respiratory failure with hypercapnia: Secondary | ICD-10-CM | POA: Diagnosis present

## 2016-07-01 DIAGNOSIS — Z87891 Personal history of nicotine dependence: Secondary | ICD-10-CM | POA: Diagnosis not present

## 2016-07-01 DIAGNOSIS — Z8614 Personal history of Methicillin resistant Staphylococcus aureus infection: Secondary | ICD-10-CM | POA: Diagnosis not present

## 2016-07-01 DIAGNOSIS — R259 Unspecified abnormal involuntary movements: Secondary | ICD-10-CM | POA: Diagnosis not present

## 2016-07-01 DIAGNOSIS — M79601 Pain in right arm: Secondary | ICD-10-CM | POA: Diagnosis not present

## 2016-07-01 DIAGNOSIS — R2681 Unsteadiness on feet: Secondary | ICD-10-CM | POA: Diagnosis not present

## 2016-07-01 DIAGNOSIS — J189 Pneumonia, unspecified organism: Secondary | ICD-10-CM | POA: Diagnosis present

## 2016-07-01 DIAGNOSIS — M545 Low back pain: Secondary | ICD-10-CM | POA: Diagnosis not present

## 2016-07-01 DIAGNOSIS — E039 Hypothyroidism, unspecified: Secondary | ICD-10-CM | POA: Diagnosis present

## 2016-07-01 DIAGNOSIS — J440 Chronic obstructive pulmonary disease with acute lower respiratory infection: Secondary | ICD-10-CM | POA: Diagnosis present

## 2016-07-01 DIAGNOSIS — I5032 Chronic diastolic (congestive) heart failure: Secondary | ICD-10-CM | POA: Diagnosis present

## 2016-07-01 DIAGNOSIS — G92 Toxic encephalopathy: Secondary | ICD-10-CM | POA: Diagnosis present

## 2016-07-01 DIAGNOSIS — I130 Hypertensive heart and chronic kidney disease with heart failure and stage 1 through stage 4 chronic kidney disease, or unspecified chronic kidney disease: Secondary | ICD-10-CM | POA: Diagnosis present

## 2016-07-01 DIAGNOSIS — R0789 Other chest pain: Secondary | ICD-10-CM | POA: Diagnosis not present

## 2016-07-01 DIAGNOSIS — M6281 Muscle weakness (generalized): Secondary | ICD-10-CM | POA: Diagnosis not present

## 2016-07-01 DIAGNOSIS — Z96652 Presence of left artificial knee joint: Secondary | ICD-10-CM | POA: Diagnosis present

## 2016-07-01 DIAGNOSIS — N189 Chronic kidney disease, unspecified: Secondary | ICD-10-CM | POA: Diagnosis not present

## 2016-07-01 DIAGNOSIS — Z7982 Long term (current) use of aspirin: Secondary | ICD-10-CM | POA: Diagnosis not present

## 2016-07-01 DIAGNOSIS — E860 Dehydration: Secondary | ICD-10-CM | POA: Diagnosis present

## 2016-07-01 DIAGNOSIS — R410 Disorientation, unspecified: Secondary | ICD-10-CM | POA: Diagnosis not present

## 2016-07-01 DIAGNOSIS — I10 Essential (primary) hypertension: Secondary | ICD-10-CM | POA: Diagnosis not present

## 2016-07-01 DIAGNOSIS — R7989 Other specified abnormal findings of blood chemistry: Secondary | ICD-10-CM | POA: Diagnosis not present

## 2016-07-01 DIAGNOSIS — S62109A Fracture of unspecified carpal bone, unspecified wrist, initial encounter for closed fracture: Secondary | ICD-10-CM | POA: Diagnosis not present

## 2016-07-01 DIAGNOSIS — R4182 Altered mental status, unspecified: Secondary | ICD-10-CM | POA: Diagnosis not present

## 2016-07-01 DIAGNOSIS — R278 Other lack of coordination: Secondary | ICD-10-CM | POA: Diagnosis not present

## 2016-07-01 DIAGNOSIS — J9621 Acute and chronic respiratory failure with hypoxia: Secondary | ICD-10-CM | POA: Diagnosis not present

## 2016-07-01 DIAGNOSIS — G9340 Encephalopathy, unspecified: Secondary | ICD-10-CM

## 2016-07-01 DIAGNOSIS — W19XXXD Unspecified fall, subsequent encounter: Secondary | ICD-10-CM | POA: Diagnosis not present

## 2016-07-01 DIAGNOSIS — Z79899 Other long term (current) drug therapy: Secondary | ICD-10-CM | POA: Diagnosis not present

## 2016-07-01 DIAGNOSIS — Z7902 Long term (current) use of antithrombotics/antiplatelets: Secondary | ICD-10-CM | POA: Diagnosis not present

## 2016-07-01 DIAGNOSIS — R079 Chest pain, unspecified: Secondary | ICD-10-CM | POA: Diagnosis not present

## 2016-07-01 DIAGNOSIS — I447 Left bundle-branch block, unspecified: Secondary | ICD-10-CM | POA: Diagnosis present

## 2016-07-01 DIAGNOSIS — E872 Acidosis: Secondary | ICD-10-CM | POA: Diagnosis present

## 2016-07-01 DIAGNOSIS — R0902 Hypoxemia: Secondary | ICD-10-CM | POA: Insufficient documentation

## 2016-07-01 DIAGNOSIS — R41841 Cognitive communication deficit: Secondary | ICD-10-CM | POA: Diagnosis not present

## 2016-07-01 DIAGNOSIS — E038 Other specified hypothyroidism: Secondary | ICD-10-CM | POA: Diagnosis not present

## 2016-07-01 LAB — COMPREHENSIVE METABOLIC PANEL
ALK PHOS: 89 U/L (ref 38–126)
ALT: 95 U/L — AB (ref 14–54)
ALT: 86 U/L — AB (ref 14–54)
AST: 121 U/L — AB (ref 15–41)
AST: 123 U/L — AB (ref 15–41)
Albumin: 3.8 g/dL (ref 3.5–5.0)
Albumin: 3.7 g/dL (ref 3.5–5.0)
Alkaline Phosphatase: 88 U/L (ref 38–126)
Anion gap: 10 (ref 5–15)
Anion gap: 10 (ref 5–15)
BUN: 36 mg/dL — AB (ref 6–20)
BUN: 35 mg/dL — AB (ref 6–20)
CALCIUM: 8.5 mg/dL — AB (ref 8.9–10.3)
CHLORIDE: 105 mmol/L (ref 101–111)
CHLORIDE: 105 mmol/L (ref 101–111)
CO2: 24 mmol/L (ref 22–32)
CO2: 24 mmol/L (ref 22–32)
CREATININE: 2.21 mg/dL — AB (ref 0.44–1.00)
CREATININE: 2.27 mg/dL — AB (ref 0.44–1.00)
Calcium: 8.7 mg/dL — ABNORMAL LOW (ref 8.9–10.3)
GFR calc Af Amer: 23 mL/min — ABNORMAL LOW (ref >60)
GFR calc non Af Amer: 20 mL/min — ABNORMAL LOW (ref >60)
GFR calc non Af Amer: 20 mL/min — ABNORMAL LOW (ref >60)
GFR, EST AFRICAN AMERICAN: 23 mL/min — AB (ref >60)
Glucose, Bld: 114 mg/dL — ABNORMAL HIGH (ref 65–99)
Glucose, Bld: 94 mg/dL (ref 65–99)
Potassium: 5.5 mmol/L — ABNORMAL HIGH (ref 3.5–5.1)
Potassium: 5.4 mmol/L — ABNORMAL HIGH (ref 3.5–5.1)
SODIUM: 139 mmol/L (ref 135–145)
SODIUM: 139 mmol/L (ref 135–145)
Total Bilirubin: 0.6 mg/dL (ref 0.3–1.2)
Total Bilirubin: 0.7 mg/dL (ref 0.3–1.2)
Total Protein: 6.4 g/dL — ABNORMAL LOW (ref 6.5–8.1)
Total Protein: 6.6 g/dL (ref 6.5–8.1)

## 2016-07-01 LAB — CBC WITH DIFFERENTIAL/PLATELET
BASOS ABS: 0 10*3/uL (ref 0.0–0.1)
Basophils Absolute: 0 10*3/uL (ref 0.0–0.1)
Basophils Relative: 0 %
Basophils Relative: 0 %
EOS ABS: 0 10*3/uL (ref 0.0–0.7)
EOS ABS: 0 10*3/uL (ref 0.0–0.7)
EOS PCT: 0 %
Eosinophils Relative: 0 %
HCT: 33.5 % — ABNORMAL LOW (ref 36.0–46.0)
HCT: 32.9 % — ABNORMAL LOW (ref 36.0–46.0)
HEMOGLOBIN: 8.7 g/dL — AB (ref 12.0–15.0)
HEMOGLOBIN: 8.6 g/dL — AB (ref 12.0–15.0)
LYMPHS ABS: 0.9 10*3/uL (ref 0.7–4.0)
LYMPHS ABS: 0.7 10*3/uL (ref 0.7–4.0)
LYMPHS PCT: 7 %
Lymphocytes Relative: 10 %
MCH: 21.8 pg — ABNORMAL LOW (ref 26.0–34.0)
MCH: 22.1 pg — AB (ref 26.0–34.0)
MCHC: 25.7 g/dL — ABNORMAL LOW (ref 30.0–36.0)
MCHC: 26.4 g/dL — AB (ref 30.0–36.0)
MCV: 83.7 fL (ref 78.0–100.0)
MCV: 84.8 fL (ref 78.0–100.0)
MONOS PCT: 7 %
Monocytes Absolute: 0.6 10*3/uL (ref 0.1–1.0)
Monocytes Absolute: 0.6 10*3/uL (ref 0.1–1.0)
Monocytes Relative: 6 %
NEUTROS PCT: 83 %
NEUTROS PCT: 87 %
Neutro Abs: 8.3 10*3/uL — ABNORMAL HIGH (ref 1.7–7.7)
Neutro Abs: 7.7 10*3/uL (ref 1.7–7.7)
PLATELETS: 202 10*3/uL (ref 150–400)
Platelets: 195 10*3/uL (ref 150–400)
RBC: 3.93 MIL/uL (ref 3.87–5.11)
RBC: 3.95 MIL/uL (ref 3.87–5.11)
RDW: 19.3 % — ABNORMAL HIGH (ref 11.5–15.5)
RDW: 19.6 % — ABNORMAL HIGH (ref 11.5–15.5)
WBC: 9.3 10*3/uL (ref 4.0–10.5)
WBC: 9.6 10*3/uL (ref 4.0–10.5)

## 2016-07-01 LAB — URINE MICROSCOPIC-ADD ON

## 2016-07-01 LAB — URINALYSIS, ROUTINE W REFLEX MICROSCOPIC
Glucose, UA: NEGATIVE mg/dL
Hgb urine dipstick: NEGATIVE
Ketones, ur: NEGATIVE mg/dL
LEUKOCYTES UA: NEGATIVE
NITRITE: NEGATIVE
PH: 5 (ref 5.0–8.0)
Protein, ur: 100 mg/dL — AB
SPECIFIC GRAVITY, URINE: 1.026 (ref 1.005–1.030)

## 2016-07-01 LAB — I-STAT ARTERIAL BLOOD GAS, ED
Acid-base deficit: 3 mmol/L — ABNORMAL HIGH (ref 0.0–2.0)
BICARBONATE: 25.8 mmol/L (ref 20.0–28.0)
O2 SAT: 87 %
PCO2 ART: 63.4 mmHg — AB (ref 32.0–48.0)
PO2 ART: 63 mmHg — AB (ref 83.0–108.0)
Patient temperature: 97.5
TCO2: 28 mmol/L (ref 0–100)
pH, Arterial: 7.214 — ABNORMAL LOW (ref 7.350–7.450)

## 2016-07-01 LAB — LIPASE, BLOOD: Lipase: 30 U/L (ref 11–51)

## 2016-07-01 LAB — PROTIME-INR
INR: 1.17
Prothrombin Time: 15 seconds (ref 11.4–15.2)

## 2016-07-01 LAB — POCT I-STAT 3, ART BLOOD GAS (G3+)
Acid-base deficit: 1 mmol/L (ref 0.0–2.0)
Bicarbonate: 23.7 mmol/L (ref 20.0–28.0)
O2 Saturation: 99 %
PCO2 ART: 39.4 mmHg (ref 32.0–48.0)
PO2 ART: 152 mmHg — AB (ref 83.0–108.0)
Patient temperature: 98.6
TCO2: 25 mmol/L (ref 0–100)
pH, Arterial: 7.388 (ref 7.350–7.450)

## 2016-07-01 LAB — I-STAT CHEM 8, ED
BUN: 37 mg/dL — AB (ref 6–20)
CHLORIDE: 107 mmol/L (ref 101–111)
CREATININE: 2.2 mg/dL — AB (ref 0.44–1.00)
Calcium, Ion: 1.11 mmol/L — ABNORMAL LOW (ref 1.15–1.40)
Glucose, Bld: 108 mg/dL — ABNORMAL HIGH (ref 65–99)
HEMATOCRIT: 30 % — AB (ref 36.0–46.0)
Hemoglobin: 10.2 g/dL — ABNORMAL LOW (ref 12.0–15.0)
POTASSIUM: 5.4 mmol/L — AB (ref 3.5–5.1)
SODIUM: 140 mmol/L (ref 135–145)
TCO2: 24 mmol/L (ref 0–100)

## 2016-07-01 LAB — RAPID URINE DRUG SCREEN, HOSP PERFORMED
Amphetamines: NOT DETECTED
BARBITURATES: NOT DETECTED
Benzodiazepines: NOT DETECTED
COCAINE: NOT DETECTED
OPIATES: POSITIVE — AB
Tetrahydrocannabinol: NOT DETECTED

## 2016-07-01 LAB — I-STAT TROPONIN, ED: Troponin i, poc: 0.04 ng/mL (ref 0.00–0.08)

## 2016-07-01 LAB — MAGNESIUM: MAGNESIUM: 2 mg/dL (ref 1.7–2.4)

## 2016-07-01 LAB — BASIC METABOLIC PANEL
Anion gap: 8 (ref 5–15)
BUN: 35 mg/dL — AB (ref 6–20)
CHLORIDE: 109 mmol/L (ref 101–111)
CO2: 24 mmol/L (ref 22–32)
Calcium: 8.1 mg/dL — ABNORMAL LOW (ref 8.9–10.3)
Creatinine, Ser: 1.62 mg/dL — ABNORMAL HIGH (ref 0.44–1.00)
GFR calc Af Amer: 34 mL/min — ABNORMAL LOW (ref >60)
GFR calc non Af Amer: 29 mL/min — ABNORMAL LOW (ref >60)
GLUCOSE: 98 mg/dL (ref 65–99)
POTASSIUM: 4.5 mmol/L (ref 3.5–5.1)
Sodium: 141 mmol/L (ref 135–145)

## 2016-07-01 LAB — I-STAT CG4 LACTIC ACID, ED: Lactic Acid, Venous: 1.41 mmol/L (ref 0.5–1.9)

## 2016-07-01 LAB — STREP PNEUMONIAE URINARY ANTIGEN: Strep Pneumo Urinary Antigen: NEGATIVE

## 2016-07-01 LAB — MRSA PCR SCREENING: MRSA by PCR: POSITIVE — AB

## 2016-07-01 LAB — OCCULT BLOOD X 1 CARD TO LAB, STOOL: FECAL OCCULT BLD: NEGATIVE

## 2016-07-01 LAB — TSH: TSH: 0.639 u[IU]/mL (ref 0.350–4.500)

## 2016-07-01 LAB — GLUCOSE, CAPILLARY: Glucose-Capillary: 92 mg/dL (ref 65–99)

## 2016-07-01 LAB — AMMONIA: AMMONIA: 22 umol/L (ref 9–35)

## 2016-07-01 MED ORDER — CLOPIDOGREL BISULFATE 75 MG PO TABS
75.0000 mg | ORAL_TABLET | Freq: Every day | ORAL | Status: DC
  Administered 2016-07-01 – 2016-07-05 (×5): 75 mg via ORAL
  Filled 2016-07-01 (×5): qty 1

## 2016-07-01 MED ORDER — ASPIRIN 81 MG PO CHEW
81.0000 mg | CHEWABLE_TABLET | Freq: Every day | ORAL | Status: DC
  Administered 2016-07-01 – 2016-07-05 (×5): 81 mg via ORAL
  Filled 2016-07-01 (×5): qty 1

## 2016-07-01 MED ORDER — SIMVASTATIN 40 MG PO TABS
20.0000 mg | ORAL_TABLET | Freq: Every day | ORAL | Status: DC
  Administered 2016-07-01 – 2016-07-04 (×4): 20 mg via ORAL
  Filled 2016-07-01 (×6): qty 1

## 2016-07-01 MED ORDER — LEVOTHYROXINE SODIUM 100 MCG IV SOLR
37.5000 ug | Freq: Every day | INTRAVENOUS | Status: DC
  Administered 2016-07-01: 37.5 ug via INTRAVENOUS
  Filled 2016-07-01: qty 5

## 2016-07-01 MED ORDER — MUPIROCIN 2 % EX OINT
1.0000 "application " | TOPICAL_OINTMENT | Freq: Two times a day (BID) | CUTANEOUS | Status: DC
  Administered 2016-07-01 – 2016-07-05 (×9): 1 via NASAL
  Filled 2016-07-01 (×4): qty 22

## 2016-07-01 MED ORDER — ISOSORBIDE MONONITRATE ER 30 MG PO TB24
30.0000 mg | ORAL_TABLET | Freq: Every day | ORAL | Status: DC
  Administered 2016-07-01 – 2016-07-05 (×5): 30 mg via ORAL
  Filled 2016-07-01 (×5): qty 1

## 2016-07-01 MED ORDER — ORAL CARE MOUTH RINSE
15.0000 mL | Freq: Two times a day (BID) | OROMUCOSAL | Status: DC
  Administered 2016-07-01 – 2016-07-04 (×8): 15 mL via OROMUCOSAL

## 2016-07-01 MED ORDER — ENOXAPARIN SODIUM 30 MG/0.3ML ~~LOC~~ SOLN
30.0000 mg | SUBCUTANEOUS | Status: DC
  Administered 2016-07-01 – 2016-07-05 (×5): 30 mg via SUBCUTANEOUS
  Filled 2016-07-01 (×6): qty 0.3

## 2016-07-01 MED ORDER — HYDRALAZINE HCL 20 MG/ML IJ SOLN
10.0000 mg | INTRAMUSCULAR | Status: DC | PRN
  Administered 2016-07-02 – 2016-07-05 (×6): 10 mg via INTRAVENOUS
  Filled 2016-07-01 (×6): qty 1

## 2016-07-01 MED ORDER — ACETAMINOPHEN 325 MG PO TABS
650.0000 mg | ORAL_TABLET | Freq: Four times a day (QID) | ORAL | Status: DC | PRN
  Administered 2016-07-01: 650 mg via ORAL
  Filled 2016-07-01: qty 2

## 2016-07-01 MED ORDER — CHLORHEXIDINE GLUCONATE CLOTH 2 % EX PADS
6.0000 | MEDICATED_PAD | Freq: Every day | CUTANEOUS | Status: DC
  Administered 2016-07-01 – 2016-07-05 (×4): 6 via TOPICAL

## 2016-07-01 MED ORDER — LEVOTHYROXINE SODIUM 75 MCG PO TABS: 75.0000 ug | ORAL_TABLET | Freq: Every day | ORAL | Status: DC

## 2016-07-01 MED ORDER — METOPROLOL TARTRATE 100 MG PO TABS
100.0000 mg | ORAL_TABLET | Freq: Two times a day (BID) | ORAL | Status: DC
  Administered 2016-07-01 – 2016-07-05 (×9): 100 mg via ORAL
  Filled 2016-07-01 (×10): qty 1

## 2016-07-01 MED ORDER — SODIUM POLYSTYRENE SULFONATE 15 GM/60ML PO SUSP
30.0000 g | Freq: Once | ORAL | Status: AC
  Administered 2016-07-01: 30 g via ORAL
  Filled 2016-07-01: qty 120

## 2016-07-01 MED ORDER — PANTOPRAZOLE SODIUM 40 MG PO TBEC
40.0000 mg | DELAYED_RELEASE_TABLET | Freq: Every day | ORAL | Status: DC
  Administered 2016-07-01 – 2016-07-05 (×5): 40 mg via ORAL
  Filled 2016-07-01 (×5): qty 1

## 2016-07-01 MED ORDER — LEVOFLOXACIN IN D5W 500 MG/100ML IV SOLN
500.0000 mg | INTRAVENOUS | Status: DC
  Administered 2016-07-01 – 2016-07-03 (×2): 500 mg via INTRAVENOUS
  Filled 2016-07-01 (×2): qty 100

## 2016-07-01 MED ORDER — SODIUM BICARBONATE 650 MG PO TABS
650.0000 mg | ORAL_TABLET | Freq: Three times a day (TID) | ORAL | Status: DC
  Administered 2016-07-01 – 2016-07-05 (×13): 650 mg via ORAL
  Filled 2016-07-01 (×14): qty 1

## 2016-07-01 MED ORDER — FLUTICASONE PROPIONATE 50 MCG/ACT NA SUSP
2.0000 | Freq: Every day | NASAL | Status: DC
  Administered 2016-07-01 – 2016-07-05 (×5): 2 via NASAL
  Filled 2016-07-01 (×2): qty 16

## 2016-07-01 MED ORDER — NITROGLYCERIN 0.4 MG SL SUBL
0.4000 mg | SUBLINGUAL_TABLET | SUBLINGUAL | Status: DC | PRN
  Administered 2016-07-02: 0.4 mg via SUBLINGUAL
  Filled 2016-07-01: qty 1

## 2016-07-01 MED ORDER — LEVOCETIRIZINE DIHYDROCHLORIDE 5 MG PO TABS: 5.0000 mg | ORAL_TABLET | Freq: Every evening | ORAL | Status: DC

## 2016-07-01 MED ORDER — LORATADINE 10 MG PO TABS
10.0000 mg | ORAL_TABLET | Freq: Every day | ORAL | Status: DC
  Administered 2016-07-01 – 2016-07-05 (×5): 10 mg via ORAL
  Filled 2016-07-01 (×5): qty 1

## 2016-07-01 MED ORDER — ALBUTEROL SULFATE (2.5 MG/3ML) 0.083% IN NEBU: 2.5000 mg | INHALATION_SOLUTION | RESPIRATORY_TRACT | Status: DC | PRN

## 2016-07-01 MED ORDER — SODIUM CHLORIDE 0.9 % IV SOLN
INTRAVENOUS | Status: DC
  Administered 2016-07-01 – 2016-07-02 (×2): via INTRAVENOUS

## 2016-07-01 MED ORDER — TIOTROPIUM BROMIDE MONOHYDRATE 18 MCG IN CAPS
18.0000 ug | ORAL_CAPSULE | Freq: Every day | RESPIRATORY_TRACT | Status: DC
  Administered 2016-07-01 – 2016-07-02 (×2): 18 ug via RESPIRATORY_TRACT
  Filled 2016-07-01: qty 5

## 2016-07-01 MED ORDER — LEVOTHYROXINE SODIUM 75 MCG PO TABS
75.0000 ug | ORAL_TABLET | Freq: Every day | ORAL | Status: DC
  Administered 2016-07-02 – 2016-07-05 (×4): 75 ug via ORAL
  Filled 2016-07-01 (×4): qty 1

## 2016-07-01 MED ORDER — MOMETASONE FURO-FORMOTEROL FUM 200-5 MCG/ACT IN AERO
2.0000 | INHALATION_SPRAY | Freq: Two times a day (BID) | RESPIRATORY_TRACT | Status: DC
  Administered 2016-07-01 – 2016-07-02 (×3): 2 via RESPIRATORY_TRACT
  Filled 2016-07-01: qty 8.8

## 2016-07-01 NOTE — Progress Notes (Signed)
RT placed patient on BIPAP per MD order. Patient placed on 16/8 and 35%. Patient tolerating well at this time.

## 2016-07-01 NOTE — H&P (Signed)
History and Physical    Carolyn Hendricks XEV:[id number] DOB: [birth year] DOA: 06/30/2016  PCP: [name], MD  Patient coming from: Home.  Chief Complaint: Confusion and sedation.  History obtained from patient's daughter.  HPI: Carolyn Hendricks is a 80 y.o. female with chronic kidney disease probably stage 3-4, COPD, hypertension, chronic anemia, hyperlipidemia, diastolic dysfunction was brought to the ER after patient was found to be increasingly confused since yesterday morning. Patient also was hallucinating. Patient had a fracture of the right wrist for which patient had cast placed last week. As per patient's daughter, there was no change in medication and patient has been on hydrocodone for last 2 years. In the ER patient is found to be drowsy and hypoxic. Patient is being admitted for mental status changes with drowsiness and hypoxia. Chest x-ray shows possible infiltrates. CT head was unremarkable.   On my exam patient is arousable and responds to her name. But drowsy. Pupils are equal and reacting to light.  ED Course: CT head was unremarkable. Chest x-ray shows possible infiltrates.  Review of Systems: As per HPI, rest all negative.   Past Medical History:  Diagnosis Date  . Arthritis   . Chest pain   . Chronic kidney disease   . Diastolic dysfunction   . Dizziness   . Head ache   . Heart murmur   . High blood pressure   . History of MRSA infection   . Shortness of breath     Past Surgical History:  Procedure Laterality Date  . APPENDECTOMY  1950  . CARDIAC CATHETERIZATION N/A 06/12/2015   Procedure: Left Heart Cath and Coronary Angiography;  Surgeon: Minna Merritts, MD;  Location: [facility];  Service: Cardiovascular;  Laterality: N/A;  . CAROTID ENDARTERECTOMY Left Dr. Lucky Cowboy  . CHOLECYSTECTOMY N/A 12/01/2015   Procedure: LAPAROSCOPIC CHOLECYSTECTOMY WITH INTRAOPERATIVE CHOLANGIOGRAM;  Surgeon: Hubbard Robinson, MD;  Location: [facility];  Service:  General;  Laterality: N/A;  . foot and ankle surgery    . FRACTURE SURGERY    . JOINT REPLACEMENT    . knee replacement (other) Left 2002     reports that she quit smoking about 4 years ago. Her smoking use included Cigarettes. She has a 26.00 pack-year smoking history. She has never used smokeless tobacco. She reports that she does not drink alcohol or use drugs.  Allergies  Allergen Reactions  . Atorvastatin     Other reaction(s): Muscle Pain  . Crestor  [Rosuvastatin Calcium]     Other reaction(s): Muscle Pain  . Pravastatin Sodium     Muscle pain  . Tape Other (See Comments)    Makes a bruise on my skin. Paper tape is ok to use.    Family History  Problem Relation Age of Onset  . Congestive Heart Failure Mother 74  . Cancer Brother   . Kidney failure Father     Prior to Admission medications   Medication Sig Start Date End Date Taking? Authorizing Provider  Aclidinium Bromide (TUDORZA PRESSAIR) 400 MCG/ACT AEPB Inhale 1 puff into the lungs 2 (two) times daily. Patient taking differently: Inhale 1 puff into the lungs 2 (two) times daily. As needed. 10/22/15   Birdie Sons, MD  aspirin 81 MG tablet Take 81 mg by mouth every morning. Reported on 01/07/2016    Historical Provider, MD  cetirizine (ZYRTEC) 10 MG tablet Take 10 mg by mouth at bedtime.     Historical Provider, MD  clopidogrel (PLAVIX)  75 MG tablet Take 1 tablet (75 mg total) by mouth every morning. 05/23/16   Birdie Sons, MD  cyanocobalamin (,VITAMIN B-12,) 1000 MCG/ML injection 1 ml injected subcutaneously once a month 01/07/16   Birdie Sons, MD  Ferrous Sulfate (IRON) 325 (65 Fe) MG TABS Take by mouth.    Historical Provider, MD  fluticasone (FLONASE) 50 MCG/ACT nasal spray INHALE ONE TO TWO SPRAY IN EACH NOSTRIL DAILY AS NEEDED 09/17/15   Birdie Sons, MD  Fluticasone-Salmeterol (ADVAIR DISKUS) 250-50 MCG/DOSE AEPB Inhale 1 puff into the lungs every 12 (twelve) hours. 06/29/15   Birdie Sons, MD    furosemide (LASIX) 20 MG tablet Take 20 mg by mouth. Pt reports she does not take lasix everyday, my bladder is so small, it makes me go to the bathroom a lot. 09/26/15   Historical Provider, MD  HYDROcodone-acetaminophen (NORCO) 10-325 MG tablet Take 1 tablet by mouth every 4 (four) hours as needed for severe pain. 06/27/16   Birdie Sons, MD  isosorbide mononitrate (IMDUR) 30 MG 24 hr tablet TAKE 1 TABLET BY MOUTH DAILY FOR HEART 05/10/16   Minna Merritts, MD  levocetirizine (XYZAL) 5 MG tablet Take 5 mg by mouth every evening.    Historical Provider, MD  levothyroxine (SYNTHROID, LEVOTHROID) 75 MCG tablet TAKE 1 TABLET BY MOUTH DAILY (FOR THYROID) 04/13/16   Birdie Sons, MD  LORazepam (ATIVAN) 0.5 MG tablet Take 0.5 mg by mouth every 8 (eight) hours. As needed.    Historical Provider, MD  meloxicam (MOBIC) 15 MG tablet TAKE ONE (1) TABLET BY MOUTH DAILY AS NEEDED FOR ARTHRITIS PAIN 06/11/16   Birdie Sons, MD  metoprolol (LOPRESSOR) 100 MG tablet Take 1 tablet (100 mg total) by mouth 2 (two) times daily. 03/08/16   Minna Merritts, MD  Multiple Vitamin (MULTI-VITAMIN DAILY PO) Take by mouth.    Historical Provider, MD  mupirocin ointment (BACTROBAN) 2 % APPLY TO AFFECTED AREA(S) TWO TIMES PER DAY AS NEEDED 12/17/15   Birdie Sons, MD  nitroGLYCERIN (NITROSTAT) 0.4 MG SL tablet Place 1 tablet (0.4 mg total) under the tongue every 5 (five) minutes as needed for chest pain. 05/14/15   Minna Merritts, MD  pantoprazole (PROTONIX) 40 MG tablet Take 1 tablet (40 mg total) by mouth daily. Patient taking differently: Take 40 mg by mouth every morning.  10/02/15   Demetrios Loll, MD  sertraline (ZOLOFT) 50 MG tablet TAKE 0.5 TO 1 TABLET BY MOUTH DAILY FOR MOOD 02/15/16   Birdie Sons, MD  simvastatin (ZOCOR) 20 MG tablet TAKE 1 TABLET BY MOUTH DAILY AT 6PM. 04/13/16   Minna Merritts, MD  sodium bicarbonate 650 MG tablet Take 650 mg by mouth 3 (three) times daily.    Historical Provider, MD  traZODone  (DESYREL) 100 MG tablet TAKE ONE-HALF OR ONE TABLET BY MOUTH AT BEDTIME AS NEEDED FOR SLEEP 06/11/16   Birdie Sons, MD  valsartan-hydrochlorothiazide (DIOVAN-HCT) 320-12.5 MG per tablet Take 1 tablet by mouth every morning.     Historical Provider, MD    Physical Exam: Vitals:   07/01/16 0300 07/01/16 0315 07/01/16 0330 07/01/16 0345  BP: 122/56 111/64 113/66 124/59  Pulse: (!) 52 (!) 51 (!) 47 (!) 51  Resp: _0 Temp:      TempSrc:      SpO2: 100% 100% 100% 100%      Constitutional: Not in distress. Vitals:   07/01/16 0300 07/01/16  0315 07/01/16 0330 07/01/16 0345  BP: 122/56 111/64 113/66 124/59  Pulse: (!) 52 (!) 51 (!) 47 (!) 51  Resp: _0 Temp:      TempSrc:      SpO2: 100% 100% 100% 100%   Eyes: Anicteric no pallor. Pupils reacting to light. ENMT: No discharge from ears eyes nose and mouth. Neck: No neck rigidity. No mass felt. Respiratory: No rhonchi or crepitations. Cardiovascular: S1 and S2 heard. Abdomen: Soft nontender bowel sounds present. No guarding or rigidity. Musculoskeletal: Right hand is in cast. Skin: No rash. Neurologic: Patient is drowsy but arousable. Difficult to have further neurological exam. Pupils reacting to light. Psychiatric: Appears drowsy.   Labs on Admission: I have personally reviewed following labs and imaging studies  CBC:  Recent Labs Lab 07/01/16 0059 07/01/16 0122  WBC 9.6  --   NEUTROABS 8.3*  --   HGB 8.6* 10.2*  HCT 33.5* 30.0*  MCV 84.8  --   PLT 202  --    Basic Metabolic Panel:  Recent Labs Lab 07/01/16 0059 07/01/16 0122  NA 139 140  K 5.5* 5.4*  CL 105 107  CO2 24  --   GLUCOSE 114* 108*  BUN 35* 37*  CREATININE 2.27* 2.20*  CALCIUM 8.5*  --   MG 2.0  --    GFR: Estimated Creatinine Clearance: 20.3 mL/min (by C-G formula based on SCr of 2.2 mg/dL (H)). Liver Function Tests:  Recent Labs Lab 07/01/16 0059  AST 123*  ALT 86*  ALKPHOS 89  BILITOT 0.6  PROT 6.4*  ALBUMIN  3.8    Recent Labs Lab 07/01/16 0059  LIPASE 30   No results for input(s): AMMONIA in the last 168 hours. Coagulation Profile:  Recent Labs Lab 07/01/16 0059  INR 1.17   Cardiac Enzymes: No results for input(s): CKTOTAL, CKMB, CKMBINDEX, TROPONINI in the last 168 hours. BNP (last 3 results) No results for input(s): PROBNP in the last 8760 hours. HbA1C: No results for input(s): HGBA1C in the last 72 hours. CBG: No results for input(s): GLUCAP in the last 168 hours. Lipid Profile: No results for input(s): CHOL, HDL, LDLCALC, TRIG, CHOLHDL, LDLDIRECT in the last 72 hours. Thyroid Function Tests: No results for input(s): TSH, T4TOTAL, FREET4, T3FREE, THYROIDAB in the last 72 hours. Anemia Panel: No results for input(s): VITAMINB12, FOLATE, FERRITIN, TIBC, IRON, RETICCTPCT in the last 72 hours. Urine analysis:    Component Value Date/Time   COLORURINE AMBER (A) 07/01/2016 0130   APPEARANCEUR HAZY (A) 07/01/2016 0130   APPEARANCEUR Clear 03/13/2014 1427   LABSPEC 1.026 07/01/2016 0130   LABSPEC 1.026 03/13/2014 1427   PHURINE 5.0 07/01/2016 0130   GLUCOSEU NEGATIVE 07/01/2016 0130   GLUCOSEU Negative 03/13/2014 1427   HGBUR NEGATIVE 07/01/2016 0130   BILIRUBINUR SMALL (A) 07/01/2016 0130   BILIRUBINUR Negative 03/13/2014 1427   KETONESUR NEGATIVE 07/01/2016 0130   PROTEINUR 100 (A) 07/01/2016 0130   UROBILINOGEN 0.2 03/17/2009 1135   NITRITE NEGATIVE 07/01/2016 0130   LEUKOCYTESUR NEGATIVE 07/01/2016 0130   LEUKOCYTESUR 1+ 03/13/2014 1427   Sepsis Labs: _1 (procalcitonin:4,lacticidven:4) )No results found for this or any previous visit (from the past 240 hour(s)).   Radiological Exams on Admission: No results found.  EKG: Independently reviewed. Normal sinus rhythm with LBBB.  Assessment/Plan Principal Problem:   Acute encephalopathy Active Problems:   Essential (primary) hypertension   Hypothyroidism   Renal failure (ARF), acute on chronic (HCC)    Acute respiratory failure with hypoxia (  [place])    1. Acute encephalopathy - cause not clear. Patient is afebrile. Suspect patient's mental status changes could be from sedatives and pain medications in a patient with kidney disease. At this time I have ordered stat ABG, ammonia levels, MRI of the brain. If ABG shows hypercarbia I will place patient on BiPAP. Closely observe and stepdown. I'm going to hold off patient's pain medications and sedative medications. 2. Chronic kidney disease stage 3-4 with worsening of creatinine - I think patient probably has an acute component on her chronic kidney disease which could be accounting for patient's mental status changes with retaining metabolites of pain medications given,along with sedatives. For now I'm gently hydrating and closely follow metabolic panel. Patient's daughter states patient does follow with a nephrologist at [facility] Baseline creatinine in our system is around 1.2 in February this year. 3. Hypertension - in addition to patient's home medications I have placed patient on when necessary IV hydralazine. 4. Hypothyroidism on Synthroid which I have dosed IV in case patient cannot take orally due to sedation. 5. COPD - not actively wheezing will continue home inhalers. 6. Chronic anemia probably from chronic kidney disease - follow CBC. 7. Recent right wrist fracture being followed by orthopedics.   DVT prophylaxis: Lovenox. Code Status: Full code.  Family Communication: Patient's daughter.  Disposition Plan: Home.  Consults called: None.  Admission status: Observation.    Rise Patience MD [facility] Pager [phone].  If 7PM-7AM, please contact night-coverage [email or web address] Password [facility]  07/01/2016, 4:17 AM

## 2016-07-01 NOTE — ED Notes (Signed)
Patient transported to MRI 

## 2016-07-01 NOTE — ED Notes (Signed)
Pt returned from MRI °

## 2016-07-01 NOTE — Progress Notes (Addendum)
PROGRESS NOTE  Carolyn Hendricks  KLK:[id number] DOB: October 07, 1936  DOA: 06/30/2016 PCP: [name], MD   Brief Narrative:  80 year old female, daughter lives with her, PMH of stage III-4 chronic kidney disease (follows with nephrology), chronic bronchitis/? COPD not on home oxygen or CPAP, HTN, chronic anemia, HLD, chronic diastolic dysfunction (follows with Dr. Candis Musa), former smoker, sustained a fall mid last week, hit her head, evaluated at [facility] and CT head and back said to be unremarkable, fracture of right forearm, seen by outpatient orthopedics on 06/29/16 at which time cast was changed, presented to [facility] ED on 06/30/16 with new onset of confusion since morning of admission, hallucinations. No change in pain medications which are dispensed by daughter. Denies dysuria, urinary frequency or any other symptoms suggestive of infection. In the ED, CT head unremarkable, chest x-ray suggesting possible infiltrates, patient drowsy and hypoxic, placed on BiPAP and admitted to SDU.   Assessment & Plan:   Principal Problem:   Acute encephalopathy Active Problems:   Essential (primary) hypertension   Hypothyroidism   Renal failure (ARF), acute on chronic (HCC)   Acute respiratory failure with hypoxia (HCC)   Acute encephalopathy  - etiology not entirely clear. - CT head 07/01/16: No acute abnormality. Apparently had a negative head CT couple days ago at [facility].  - MRI brain 07/01/16: Negative brain MRI.  - Urine microscopy: Negative for UTI features.  - Chest x-ray 06/30/16: Right midlung and left basilar airspace opacities likely reflect atelectasis although infection cannot be ruled out. Patient lacks respiratory symptoms such as cough or fever.  - Initial ABG had shown hypercapnia/63.4 and acidosis 7.214. This resolved after brief BiPAP (pH 7.388, PCO2 39.4).  - ? Related to worsening renal insufficiency and opioids. TSH normal.  - Ammonia: Normal. Lactate normal. - Hold psychotropic medications  (Ativan, Narco, trazodone and Zoloft) temporarily and reassess.  Acute hypoxic and hypercapnic respiratory failure - ? Related to opioid pain medications complicating worsening renal insufficiency and?? Community-acquired pneumonia/chronic bronchitis/COPD - Briefly on BiPAP. ABG has improved. CO2 has normalized. Oxygen titrated to maintain sats 88-92 percent.  Acute on stage III-4 chronic kidney disease - Gentle IV fluid hydration and follow BMP in a.m. - Creatinine was 1.28 in February 2017. Admitted with creatinine of 2.27. Hold ARB-HCTZ. - Acute kidney injury possibly related to dehydration, ARB and diuretics.  Hyperkalemia - Treat for acute kidney injury. Provide a dose of Kayexalate and follow BMP. Continue oral bicarbonate.  Essential hypertension - Continue Imdur and metoprolol. Controlled.  Hypothyroid - Continue Synthroid. TSH normal.  Chronic bronchitis/possible COPD/? CAP - No clinical bronchospasm - Continue levofloxacin for today. Incentive spirometry. Check pro calcitonin. Repeat chest x-ray in a.m.  Chronic anemia -Stable. Follow CBC   Recent right wrist fracture  - Has a cast. Outpatient follow-up with orthopedics.     DVT prophylaxis: Lovenox  Code Status: Full Family Communication: Discussed with daughter and son in law at bedside Disposition Plan: Continue management in SDU for additional 24 hours.   Consultants:   None  Procedures:   BIPAP  Antimicrobials:   IV Levofloxacin    Subjective: Seen this morning. States that she has a headache coming along. Denies dyspnea or chest pain. Appears slightly confused. As per daughter and son-in-law at bedside, mental status is significantly improved compared to admission. Denies dysuria, urinary frequency or cough.   Objective:  Vitals:   07/01/16 1030 07/01/16 1100 07/01/16 1138 07/01/16 1200  BP: (!) 149/82     Pulse:  62  62  Resp:    18  Temp:   98.4 F (36.9 C)   TempSrc:   Oral   SpO2:   94%  94%    Intake/Output Summary (Last 24 hours) at 07/01/16 1230 Last data filed at 07/01/16 1100  Gross per 24 hour  Intake              695 ml  Output              200 ml  Net              495 ml   There were no vitals filed for this visit.  Examination:  General exam: Pleasant elderly female sitting up comfortably in bed. Family at bedside.  Respiratory system: diminished breath sounds in the bases but otherwise clear to auscultation without wheezing or rhonchi. Occasional basal crackles.Marland Kitchen Respiratory effort normal. Cardiovascular system: S1 & S2 heard, RRR. No JVD, murmurs, rubs, gallops or clicks. No pedal edema. Gastrointestinal system: Abdomen is nondistended, soft and nontender. No organomegaly or masses felt. Normal bowel sounds heard. Central nervous system: Alert and oriented 4 . No focal neurological deficits. Extremities: Symmetric 5 x 5 power. Right wrist cast Skin: No rashes, lesions or ulcers Psychiatry: Judgement and insight appear slightly impaired. Mood & affect appropriate.     Data Reviewed: I have personally reviewed following labs and imaging studies  CBC:  Recent Labs Lab 07/01/16 0059 07/01/16 0122 07/01/16 0655  WBC 9.6  --  9.3  NEUTROABS 8.3*  --  7.7  HGB 8.6* 10.2* 8.7*  HCT 33.5* 30.0* 32.9*  MCV 84.8  --  83.7  PLT 202  --  749   Basic Metabolic Panel:  Recent Labs Lab 07/01/16 0059 07/01/16 0122 07/01/16 0655  NA 139 140 139  K 5.5* 5.4* 5.4*  CL 105 107 105  CO2 24  --  24  GLUCOSE 114* 108* 94  BUN 35* 37* 36*  CREATININE 2.27* 2.20* 2.21*  CALCIUM 8.5*  --  8.7*  MG 2.0  --   --    GFR: Estimated Creatinine Clearance: 20.2 mL/min (by C-G formula based on SCr of 2.21 mg/dL (H)). Liver Function Tests:  Recent Labs Lab 07/01/16 0059 07/01/16 0655  AST 123* 121*  ALT 86* 95*  ALKPHOS 89 88  BILITOT 0.6 0.7  PROT 6.4* 6.6  ALBUMIN 3.8 3.7    Recent Labs Lab 07/01/16 0059  LIPASE 30    Recent Labs Lab  07/01/16 0655  AMMONIA 22   Coagulation Profile:  Recent Labs Lab 07/01/16 0059  INR 1.17   Cardiac Enzymes: No results for input(s): CKTOTAL, CKMB, CKMBINDEX, TROPONINI in the last 168 hours. BNP (last 3 results) No results for input(s): PROBNP in the last 8760 hours. HbA1C: No results for input(s): HGBA1C in the last 72 hours. CBG:  Recent Labs Lab 07/01/16 0832  GLUCAP 92   Lipid Profile: No results for input(s): CHOL, HDL, LDLCALC, TRIG, CHOLHDL, LDLDIRECT in the last 72 hours. Thyroid Function Tests:  Recent Labs  07/01/16 0655  TSH 0.639   Anemia Panel: No results for input(s): VITAMINB12, FOLATE, FERRITIN, TIBC, IRON, RETICCTPCT in the last 72 hours.  Sepsis Labs:  Recent Labs Lab 07/01/16 0125  LATICACIDVEN 1.41    Recent Results (from the past 240 hour(s))  MRSA PCR Screening     Status: Abnormal   Collection Time: 07/01/16  9:21 AM  Result Value Ref Range Status   MRSA by  PCR POSITIVE (A) NEGATIVE Final    Comment:        The GeneXpert MRSA Assay (FDA approved for NASAL specimens only), is one component of a comprehensive MRSA colonization surveillance program. It is not intended to diagnose MRSA infection nor to guide or monitor treatment for MRSA infections. RESULT CALLED TO, READ BACK BY AND VERIFIED WITH: Maryjo Rochester RN 11:50 07/01/16 ([email or web address])          Radiology Studies: Dg Chest 2 View  Result Date: 07/01/2016 CLINICAL DATA:  Acute onset of difficulty ambulating. Altered mental status. Initial encounter. EXAM: CHEST  2 VIEW COMPARISON:  Chest radiograph performed 04/14/2016 FINDINGS: The lungs are well-aerated. Right midlung and left basilar opacities likely reflect atelectasis, though mild infection might have a similar appearance. There is no evidence of pleural effusion or pneumothorax. The heart is enlarged.  No acute osseous abnormalities are seen. IMPRESSION: Cardiomegaly. Right midlung and left basilar airspace opacities likely  reflect atelectasis, though mild infection might have a similar appearance, depending on the patient's symptoms. Electronically Signed   By: Garald Balding M.D.   On: 07/01/2016 06:11   Ct Head Wo Contrast  Result Date: 07/01/2016 CLINICAL DATA:  Acute onset of altered mental status. Recent fall. Initial encounter. EXAM: CT HEAD WITHOUT CONTRAST TECHNIQUE: Contiguous axial images were obtained from the base of the skull through the vertex without intravenous contrast. COMPARISON:  CT of the head performed 02/17/2014 FINDINGS: Brain: No evidence of acute infarction, hemorrhage, hydrocephalus, extra-axial collection or mass lesion/mass effect. Prominence of the ventricles and sulci reflects mild cortical volume loss. Scattered periventricular and subcortical white matter change likely reflects small vessel ischemic microangiopathy. The brainstem and fourth ventricle are within normal limits. The basal ganglia are unremarkable in appearance. The cerebral hemispheres demonstrate grossly normal gray-white differentiation. No mass effect or midline shift is seen. Vascular: No hyperdense vessel or unexpected calcification. Skull: There is no evidence of fracture; visualized osseous structures are unremarkable in appearance. Sinuses/Orbits: The visualized portions of the orbits are within normal limits. The paranasal sinuses and mastoid air cells are well-aerated. Other: No significant soft tissue abnormalities are seen. IMPRESSION: 1. No acute intracranial pathology seen on CT. 2. Mild cortical volume loss and scattered small vessel ischemic microangiopathy. Electronically Signed   By: Garald Balding M.D.   On: 07/01/2016 06:17   Mr Brain Wo Contrast  Result Date: 07/01/2016 CLINICAL DATA:  Initial evaluation for acute altered mental status. EXAM: MRI HEAD WITHOUT CONTRAST TECHNIQUE: Multiplanar, multiecho pulse sequences of the brain and surrounding structures were obtained without intravenous contrast.  COMPARISON:  Prior CT from 06/30/2016. FINDINGS: Diffuse prominence of the CSF containing spaces is compatible with generalized cerebral atrophy. Mild scattered T2/FLAIR signal abnormality within the periventricular and deep white matter, most likely related chronic microvascular ischemic disease, minimal for age. No abnormal foci of restricted diffusion to suggest acute ischemia. Gray-white matter differentiation maintained. Major intracranial vascular flow voids are preserved. Right vertebral artery hypoplastic. No evidence for acute or chronic intracranial hemorrhage. No areas of chronic infarction. No mass lesion, midline shift, or mass effect. No hydrocephalus. No extra-axial fluid collection. Major dural sinuses are grossly patent. Craniocervical junction normal. Visualized upper cervical spine without acute abnormality. Pituitary gland within normal limits. No acute abnormality about the globes and orbits. Mild mucosal thickening within the paranasal sinuses. No air-fluid level to suggest active sinus infection. Trace opacity bilateral mastoid air cells. Inner ear structures grossly normal. Bone marrow signal intensity within normal limits. No  scalp soft tissue abnormality. IMPRESSION: Negative brain MRI for patient age. No acute intracranial process identified. Electronically Signed   By: Jeannine Boga M.D.   On: 07/01/2016 05:22        Scheduled Meds: . aspirin  81 mg Oral Daily  . Chlorhexidine Gluconate Cloth  6 each Topical Q0600  . clopidogrel  75 mg Oral Daily  . enoxaparin (LOVENOX) injection  30 mg Subcutaneous Q24H  . fluticasone  2 spray Each Nare Daily  . isosorbide mononitrate  30 mg Oral Daily  . levofloxacin (LEVAQUIN) IV  500 mg Intravenous Q48H  . levothyroxine  37.5 mcg Intravenous Daily  . loratadine  10 mg Oral Daily  . mouth rinse  15 mL Mouth Rinse BID  . metoprolol  100 mg Oral BID  . mometasone-formoterol  2 puff Inhalation BID  . mupirocin ointment  1  application Nasal BID  . pantoprazole  40 mg Oral Daily  . simvastatin  20 mg Oral q1800  . sodium bicarbonate  650 mg Oral TID  . tiotropium  18 mcg Inhalation Daily   Continuous Infusions: . sodium chloride 100 mL/hr at 07/01/16 0403     LOS: 0 days    Time spent: 45 minutes.    [facility], MD [facility] Pager [phone]  If 7PM-7AM, please contact night-coverage [email or web address] Password [facility] 07/01/2016, 12:30 PM

## 2016-07-01 NOTE — ED Notes (Signed)
Attempted to draw blood for labs, without success. Seeking additional assistance.

## 2016-07-01 NOTE — Progress Notes (Signed)
RT called phsyician with ABG results. MD ordered to put patient on BIPAP. Patient is in MRI right now and will be place on BIPAP when she returns.

## 2016-07-01 NOTE — Progress Notes (Signed)
MD notified about worsening confusion and request of pain med

## 2016-07-01 NOTE — ED Notes (Signed)
Made three attempts to start IV and draw blood, all unsuccessful. Involved other RN. Will place IV Team consult with STAT order.

## 2016-07-01 NOTE — Progress Notes (Signed)
Transported pt on BIPAP 16/8, 35% from ED to MICU with no complications. Bipap check done. Mask secure. No breakdown on face noted.

## 2016-07-01 NOTE — ED Notes (Signed)
[facility] pt's sister would like to be contacted regarding pt's disposition.

## 2016-07-01 NOTE — Progress Notes (Signed)
MD notified of MRSA positive results

## 2016-07-01 NOTE — Progress Notes (Signed)
RT placed pt on 4L [place] due to abg results. Pt tol well. RN notified

## 2016-07-01 NOTE — Progress Notes (Signed)
ANTIBIOTIC CONSULT NOTE - INITIAL  Pharmacy Consult for Levaquin Indication: CAP  Allergies  Allergen Reactions  . Atorvastatin     Other reaction(s): Muscle Pain  . Crestor  [Rosuvastatin Calcium]     Other reaction(s): Muscle Pain  . Pravastatin Sodium     Muscle pain  . Tape Other (See Comments)    Makes a bruise on my skin. Paper tape is ok to use.     Vital Signs: Temp: 98 F (36.7 C) (09/21 2305) Temp Source: Oral (09/21 2305) BP: 149/93 (09/22 0800) Pulse Rate: 56 (09/22 0800) Intake/Output from previous day: 09/21 0701 - 09/22 0700 In: -  Out: 200 [Urine:200] Intake/Output from this shift: No intake/output data recorded.  Labs:  Recent Labs  07/01/16 0059 07/01/16 0122 07/01/16 0655  WBC 9.6  --  9.3  HGB 8.6* 10.2* 8.7*  PLT 202  --  195  CREATININE 2.27* 2.20* 2.21*   Estimated Creatinine Clearance: 20.2 mL/min (by C-G formula based on SCr of 2.21 mg/dL (H)). No results for input(s): VANCOTROUGH, VANCOPEAK, VANCORANDOM, GENTTROUGH, GENTPEAK, GENTRANDOM, TOBRATROUGH, TOBRAPEAK, TOBRARND, AMIKACINPEAK, AMIKACINTROU, AMIKACIN in the last 72 hours.   Microbiology: No results found for this or any previous visit (from the past 720 hour(s)).  Medical History: Past Medical History:  Diagnosis Date  . Arthritis   . Chest pain   . Chronic kidney disease   . Diastolic dysfunction   . Dizziness   . Head ache   . Heart murmur   . High blood pressure   . History of MRSA infection   . Shortness of breath     Medications:  See EMR  Assessment: 80 yo female with altered mental status. Pt fell earlier this month and suffered an ulnar fracture. She was admitted during this time; if clinical status worsens, consider broadening antimicrobials. Will initiate Levaquin at this time for possible CAP. Pt also with small degree of renal insufficiency, SCr up to 2.2, eCrCl ~ 20 ml/min.   Goal of Therapy:  Resolution of infection  Plan:  -Levaquin 500 mg IV  q48h -Monitor cultures, renal fx, duration of therapy   Hughes Better M 07/01/2016,8:38 AM

## 2016-07-02 ENCOUNTER — Inpatient Hospital Stay (HOSPITAL_COMMUNITY): Payer: Medicare Other

## 2016-07-02 DIAGNOSIS — R7989 Other specified abnormal findings of blood chemistry: Secondary | ICD-10-CM

## 2016-07-02 DIAGNOSIS — I10 Essential (primary) hypertension: Secondary | ICD-10-CM

## 2016-07-02 DIAGNOSIS — E875 Hyperkalemia: Secondary | ICD-10-CM

## 2016-07-02 DIAGNOSIS — R079 Chest pain, unspecified: Secondary | ICD-10-CM

## 2016-07-02 DIAGNOSIS — R0789 Other chest pain: Secondary | ICD-10-CM

## 2016-07-02 LAB — CBC
HEMATOCRIT: 31.2 % — AB (ref 36.0–46.0)
HEMOGLOBIN: 8.2 g/dL — AB (ref 12.0–15.0)
MCH: 22 pg — AB (ref 26.0–34.0)
MCHC: 26.3 g/dL — AB (ref 30.0–36.0)
MCV: 83.9 fL (ref 78.0–100.0)
Platelets: 176 10*3/uL (ref 150–400)
RBC: 3.72 MIL/uL — AB (ref 3.87–5.11)
RDW: 19.5 % — ABNORMAL HIGH (ref 11.5–15.5)
WBC: 8.4 10*3/uL (ref 4.0–10.5)

## 2016-07-02 LAB — BASIC METABOLIC PANEL
Anion gap: 11 (ref 5–15)
BUN: 33 mg/dL — ABNORMAL HIGH (ref 6–20)
CHLORIDE: 109 mmol/L (ref 101–111)
CO2: 22 mmol/L (ref 22–32)
CREATININE: 1.37 mg/dL — AB (ref 0.44–1.00)
Calcium: 8.9 mg/dL (ref 8.9–10.3)
GFR calc non Af Amer: 36 mL/min — ABNORMAL LOW (ref >60)
GFR, EST AFRICAN AMERICAN: 42 mL/min — AB (ref >60)
Glucose, Bld: 97 mg/dL (ref 65–99)
POTASSIUM: 4.2 mmol/L (ref 3.5–5.1)
Sodium: 142 mmol/L (ref 135–145)

## 2016-07-02 LAB — URINE CULTURE: CULTURE: NO GROWTH

## 2016-07-02 LAB — TROPONIN I
TROPONIN I: 0.07 ng/mL — AB (ref <0.03)
Troponin I: 0.09 ng/mL (ref <0.03)

## 2016-07-02 MED ORDER — TRAZODONE HCL 50 MG PO TABS
50.0000 mg | ORAL_TABLET | Freq: Every evening | ORAL | Status: DC | PRN
  Administered 2016-07-02 (×2): 50 mg via ORAL
  Filled 2016-07-02 (×3): qty 1

## 2016-07-02 MED ORDER — SERTRALINE HCL 50 MG PO TABS
25.0000 mg | ORAL_TABLET | Freq: Every day | ORAL | Status: DC
  Administered 2016-07-02 – 2016-07-05 (×4): 25 mg via ORAL
  Filled 2016-07-02 (×4): qty 1

## 2016-07-02 MED ORDER — ACETAMINOPHEN 325 MG PO TABS: 650.0000 mg | ORAL_TABLET | Freq: Four times a day (QID) | ORAL | Status: DC | PRN

## 2016-07-02 MED ORDER — HYDROCODONE-ACETAMINOPHEN 10-325 MG PO TABS
0.5000 | ORAL_TABLET | Freq: Three times a day (TID) | ORAL | Status: DC | PRN
  Administered 2016-07-02 – 2016-07-05 (×7): 0.5 via ORAL
  Filled 2016-07-02 (×8): qty 1

## 2016-07-02 MED ORDER — MORPHINE SULFATE (PF) 2 MG/ML IV SOLN
1.0000 mg | Freq: Once | INTRAVENOUS | Status: AC
  Administered 2016-07-02: 1 mg via INTRAVENOUS
  Filled 2016-07-02: qty 1

## 2016-07-02 MED ORDER — LORAZEPAM 0.5 MG PO TABS: 0.2500 mg | ORAL_TABLET | Freq: Two times a day (BID) | ORAL | Status: DC | PRN

## 2016-07-02 NOTE — Progress Notes (Signed)
Event:  Notified by RN that patient having chest pain. Patient received prn sublingual nitroglycerin with only partial relief. EKG sinus rhythm with possible unconfirmed lateral ischemia. Troponin trending with 1st value 0.09.  On evaluation patient now reporting abdominal pain. Had received Morphine 1 mg x 1 which she states has helped. She does not appear in distress. HR 63, BP 127/104, oxygen saturation 95%.   Plan: Continue management by primary team. No changes/additions at this time.

## 2016-07-02 NOTE — Consult Note (Addendum)
Referring Physician: Hongagli  Primary Cardiologist: Rockey Situ Reason for Consultation: Elevated troponin    HPI:  Carolyn Hendricks is a 80 y.o. female with chronic kidney disease probably stage 3-4, COPD, hypertension, chronic anemia, hyperlipidemia, LBBB, diastolic dysfunction was brought to the ER yesterday with increasing confusion. We are asked to see due to elevated troponin.   Follows with Dr. Rockey Situ for PAD and diastolic HF. Had cardiac cath 9/16 with LAD 50% otherwise normal cors  Patient had a fall with fracture of the right wrist for which patient had cast placed last week. Increasing confusion since that time. Found to have PNA and CO2 narcosis possibly due to pain meds.  Troponin drawn on admit 0.09 -> 0.07. ECG with sinus brady IVCD possible lateral Qs but baseline ECG is LBBB.   Said she had some indigestion/CP after receiving Kayexalate but now resolved. Also had AKI which is now resolved.    Review of Systems:     Cardiac Review of Systems: {Y] = yes _0  = no  Chest Pain [ y   ]  Resting SOB [   ] Exertional SOB  Blue.Reese  ]  Orthopnea [  ]   Pedal Edema [   ]    Palpitations [  ] Syncope  [  ]   Presyncope [   ]  General Review of Systems: [Y] = yes [  ]=no Constitional: recent weight change [  ]; anorexia [  ]; fatigue [ y ]; nausea [  ]; night sweats [  ]; fever [  ]; or chills [  ];                                                                     Eyes : blurred vision [  ]; diplopia [   ]; vision changes [  ];  Amaurosis fugax[  ]; Resp: cough [ y ];  wheezing[  ];  hemoptysis[  ];  PND [  ];  GI:  gallstones[  ], vomiting[  ];  dysphagia[  ]; melena[  ];  hematochezia [  ]; heartburn[  ];   GU: kidney stones [  ]; hematuria[  ];   dysuria [  ];  nocturia[  ]; incontinence [  ];             Skin: rash, swelling[  ];, hair loss[  ];  peripheral edema[  ];  or itching[  ]; Musculosketetal: myalgias[  ];  joint swelling[  ];  joint erythema[  ];  joint pain[ y ];  back  pain[y  ];  Heme/Lymph: bruising[  ];  bleeding[  ];  anemia[  ];  Neuro: TIA[  ];  headaches[  ];  stroke[  ];  vertigo[  ];  seizures[  ];   paresthesias[  ];  difficulty walking[  ];  Psych:depression[  ]; anxiety[  ];  Endocrine: diabetes[  ];  thyroid dysfunction[  ];  Other:  Past Medical History:  Diagnosis Date  . Arthritis   . Chest pain   . Chronic kidney disease   . Diastolic dysfunction   . Dizziness   . Head ache   . Heart murmur   . High blood pressure   .  History of MRSA infection   . Shortness of breath     Medications Prior to Admission  Medication Sig Dispense Refill  . aspirin 81 MG tablet Take 81 mg by mouth every morning. Reported on 01/07/2016    . cetirizine (ZYRTEC) 10 MG tablet Take 10 mg by mouth at bedtime.     . clopidogrel (PLAVIX) 75 MG tablet Take 1 tablet (75 mg total) by mouth every morning. 30 tablet 5  . fluticasone (FLONASE) 50 MCG/ACT nasal spray INHALE ONE TO TWO SPRAY IN EACH NOSTRIL DAILY AS NEEDED (Patient taking differently: INHALE ONE TO TWO SPRAY IN EACH NOSTRIL DAILY AS NEEDED FOR ALLERGIES) 16 g 6  . Fluticasone-Salmeterol (ADVAIR DISKUS) 250-50 MCG/DOSE AEPB Inhale 1 puff into the lungs every 12 (twelve) hours. 60 each 12  . furosemide (LASIX) 20 MG tablet Take 20 mg by mouth daily as needed for fluid. Pt reports she does not take lasix everyday, my bladder is so small, it makes me go to the bathroom a lot.    Marland Kitchen HYDROcodone-acetaminophen (NORCO) 10-325 MG tablet Take 1 tablet by mouth every 4 (four) hours as needed for severe pain. 180 tablet 0  . isosorbide mononitrate (IMDUR) 30 MG 24 hr tablet TAKE 1 TABLET BY MOUTH DAILY FOR HEART (Patient taking differently: Take 30 mg by mouth daily for heart) 30 tablet 6  . levothyroxine (SYNTHROID, LEVOTHROID) 75 MCG tablet TAKE 1 TABLET BY MOUTH DAILY (FOR THYROID) (Patient taking differently: Take 75 mcg by mouth daily) 30 tablet 12  . LORazepam (ATIVAN) 0.5 MG tablet Take 0.5 mg by mouth every 8  (eight) hours as needed for anxiety. As needed.    . metoprolol (LOPRESSOR) 100 MG tablet Take 1 tablet (100 mg total) by mouth 2 (two) times daily. 180 tablet 3  . Multiple Vitamin (MULTI-VITAMIN DAILY PO) Take 1 tablet by mouth daily.     . mupirocin ointment (BACTROBAN) 2 % APPLY TO AFFECTED AREA(S) TWO TIMES PER DAY AS NEEDED (Patient taking differently: APPLY TO AFFECTED AREA(S) TWO TIMES PER DAY AS NEEDED FOR SKIN) 22 g 5  . pantoprazole (PROTONIX) 40 MG tablet Take 1 tablet (40 mg total) by mouth daily. (Patient taking differently: Take 40 mg by mouth every morning. ) 30 tablet 0  . sertraline (ZOLOFT) 50 MG tablet TAKE 0.5 TO 1 TABLET BY MOUTH DAILY FOR MOOD (Patient taking differently: Take 25 mg by mouth daily for mood) 30 tablet 12  . simvastatin (ZOCOR) 20 MG tablet TAKE 1 TABLET BY MOUTH DAILY AT 6PM. (Patient taking differently: Take 20 mg by mouth daily) 90 tablet 3  . sodium bicarbonate 650 MG tablet Take 650 mg by mouth 3 (three) times daily.    . Tetrahydrozoline HCl (VISINE OP) Place 2 drops into both eyes as needed (for dry eyes).    . traZODone (DESYREL) 100 MG tablet TAKE ONE-HALF OR ONE TABLET BY MOUTH AT BEDTIME AS NEEDED FOR SLEEP (Patient taking differently: Take 50-100 mg by mouth at bedtime as needed for sleep) 30 tablet 5  . valsartan-hydrochlorothiazide (DIOVAN-HCT) 320-12.5 MG per tablet Take 1 tablet by mouth every morning.     . Aclidinium Bromide (TUDORZA PRESSAIR) 400 MCG/ACT AEPB Inhale 1 puff into the lungs 2 (two) times daily. (Patient not taking: Reported on 07/01/2016) 2 each 0  . nitroGLYCERIN (NITROSTAT) 0.4 MG SL tablet Place 1 tablet (0.4 mg total) under the tongue every 5 (five) minutes as needed for chest pain. 25 tablet 3  . [DISCONTINUED]  cyanocobalamin (,VITAMIN B-12,) 1000 MCG/ML injection 1 ml injected subcutaneously once a month (Patient not taking: Reported on 07/01/2016) 10 mL 1  . [DISCONTINUED] meloxicam (MOBIC) 15 MG tablet TAKE ONE (1) TABLET BY  MOUTH DAILY AS NEEDED FOR ARTHRITIS PAIN (Patient not taking: Reported on 07/01/2016) 30 tablet 2     . aspirin  81 mg Oral Daily  . Chlorhexidine Gluconate Cloth  6 each Topical Q0600  . clopidogrel  75 mg Oral Daily  . enoxaparin (LOVENOX) injection  30 mg Subcutaneous Q24H  . fluticasone  2 spray Each Nare Daily  . isosorbide mononitrate  30 mg Oral Daily  . levofloxacin (LEVAQUIN) IV  500 mg Intravenous Q48H  . levothyroxine  75 mcg Oral QAC breakfast  . loratadine  10 mg Oral Daily  . mouth rinse  15 mL Mouth Rinse BID  . metoprolol  100 mg Oral BID  . mometasone-formoterol  2 puff Inhalation BID  . mupirocin ointment  1 application Nasal BID  . pantoprazole  40 mg Oral Daily  . sertraline  25 mg Oral Daily  . simvastatin  20 mg Oral q1800  . sodium bicarbonate  650 mg Oral TID  . tiotropium  18 mcg Inhalation Daily    Infusions:    Allergies  Allergen Reactions  . Atorvastatin Other (See Comments)    Other reaction(s): Muscle Pain  . Crestor [Rosuvastatin Calcium] Other (See Comments)    Other reaction(s): Muscle Pain  . Tape Other (See Comments)    Makes a bruise on my skin. Paper tape is ok to use.  . Pravastatin Sodium Other (See Comments)    Muscle pain    Social History   Social History  . Marital status: Widowed    Spouse name: N/A  . Number of children: 4  . Years of education: 12   Occupational History  . retired    Social History Main Topics  . Smoking status: Former Smoker    Packs/day: 1.00    Years: 26.00    Types: Cigarettes    Quit date: 10/11/2011  . Smokeless tobacco: Never Used     Comment: Patient has smoked for 26 years; has quit but now started back 0.25 ppw.  . Alcohol use No  . Drug use: No  . Sexual activity: Not on file   Other Topics Concern  . Not on file   Social History Narrative  . No narrative on file    Family History  Problem Relation Age of Onset  . Congestive Heart Failure Mother 44  . Cancer Brother   .  Kidney failure Father     PHYSICAL EXAM: Vitals:   07/02/16 1200 07/02/16 1300  BP: (!) 164/73 138/66  Pulse: 64 63  Resp: (!) 22 16  Temp:       Intake/Output Summary (Last 24 hours) at 07/02/16 1409 Last data filed at 07/02/16 0400  Gross per 24 hour  Intake             1400 ml  Output                0 ml  Net             1400 ml    General:  Elderly woman. Sitting up in bed. No respiratory difficulty HEENT: normal Neck: supple. no JVD. Carotids 2+ bilat; no bruits. No lymphadenopathy or thryomegaly appreciated. Cor: PMI nondisplaced. Regular rate & rhythm. No rubs, gallops or murmurs. Lungs: clear Abdomen: obese  soft, nontender, nondistended. No hepatosplenomegaly. No bruits or masses. Good bowel sounds. Extremities: no cyanosis, clubbing, rash, edema R wirst cast  Neuro: alert & oriented x 3, cranial nerves grossly intact. moves all 4 extremities w/o difficulty. Affect pleasant.  ECG: sinus brady 50s. IVCD. With ? lateral Qs (previous LBBB)   Results for orders placed or performed during the hospital encounter of 06/30/16 (from the past 24 hour(s))  Occult blood card to lab, stool RN will collect     Status: None   Collection Time: 07/01/16  3:11 PM  Result Value Ref Range   Fecal Occult Bld NEGATIVE NEGATIVE  Basic metabolic panel     Status: Abnormal   Collection Time: 07/01/16  4:58 PM  Result Value Ref Range   Sodium 141 135 - 145 mmol/L   Potassium 4.5 3.5 - 5.1 mmol/L   Chloride 109 101 - 111 mmol/L   CO2 24 22 - 32 mmol/L   Glucose, Bld 98 65 - 99 mg/dL   BUN 35 (H) 6 - 20 mg/dL   Creatinine, Ser 1.62 (H) 0.44 - 1.00 mg/dL   Calcium 8.1 (L) 8.9 - 10.3 mg/dL   GFR calc non Af Amer 29 (L) >60 mL/min   GFR calc Af Amer 34 (L) >60 mL/min   Anion gap 8 5 - 15  CBC     Status: Abnormal   Collection Time: 07/02/16  4:33 AM  Result Value Ref Range   WBC 8.4 4.0 - 10.5 K/uL   RBC 3.72 (L) 3.87 - 5.11 MIL/uL   Hemoglobin 8.2 (L) 12.0 - 15.0 g/dL   HCT 31.2  (L) 36.0 - 46.0 %   MCV 83.9 78.0 - 100.0 fL   MCH 22.0 (L) 26.0 - 34.0 pg   MCHC 26.3 (L) 30.0 - 36.0 g/dL   RDW 19.5 (H) 11.5 - 15.5 %   Platelets 176 150 - 400 K/uL  Basic metabolic panel     Status: Abnormal   Collection Time: 07/02/16  4:33 AM  Result Value Ref Range   Sodium 142 135 - 145 mmol/L   Potassium 4.2 3.5 - 5.1 mmol/L   Chloride 109 101 - 111 mmol/L   CO2 22 22 - 32 mmol/L   Glucose, Bld 97 65 - 99 mg/dL   BUN 33 (H) 6 - 20 mg/dL   Creatinine, Ser 1.37 (H) 0.44 - 1.00 mg/dL   Calcium 8.9 8.9 - 10.3 mg/dL   GFR calc non Af Amer 36 (L) >60 mL/min   GFR calc Af Amer 42 (L) >60 mL/min   Anion gap 11 5 - 15  Troponin I (q 6hr x 3)     Status: Abnormal   Collection Time: 07/02/16  4:41 AM  Result Value Ref Range   Troponin I 0.09 (HH) <0.03 ng/mL  Troponin I (q 6hr x 3)     Status: Abnormal   Collection Time: 07/02/16 10:39 AM  Result Value Ref Range   Troponin I 0.07 (HH) <0.03 ng/mL   Dg Chest 2 View  Result Date: 07/01/2016 CLINICAL DATA:  Acute onset of difficulty ambulating. Altered mental status. Initial encounter. EXAM: CHEST  2 VIEW COMPARISON:  Chest radiograph performed 04/14/2016 FINDINGS: The lungs are well-aerated. Right midlung and left basilar opacities likely reflect atelectasis, though mild infection might have a similar appearance. There is no evidence of pleural effusion or pneumothorax. The heart is enlarged.  No acute osseous abnormalities are seen. IMPRESSION: Cardiomegaly. Right midlung and left basilar airspace opacities  likely reflect atelectasis, though mild infection might have a similar appearance, depending on the patient's symptoms. Electronically Signed   By: Garald Balding M.D.   On: 07/01/2016 06:11   Ct Head Wo Contrast  Result Date: 07/01/2016 CLINICAL DATA:  Acute onset of altered mental status. Recent fall. Initial encounter. EXAM: CT HEAD WITHOUT CONTRAST TECHNIQUE: Contiguous axial images were obtained from the base of the skull  through the vertex without intravenous contrast. COMPARISON:  CT of the head performed 02/17/2014 FINDINGS: Brain: No evidence of acute infarction, hemorrhage, hydrocephalus, extra-axial collection or mass lesion/mass effect. Prominence of the ventricles and sulci reflects mild cortical volume loss. Scattered periventricular and subcortical white matter change likely reflects small vessel ischemic microangiopathy. The brainstem and fourth ventricle are within normal limits. The basal ganglia are unremarkable in appearance. The cerebral hemispheres demonstrate grossly normal gray-white differentiation. No mass effect or midline shift is seen. Vascular: No hyperdense vessel or unexpected calcification. Skull: There is no evidence of fracture; visualized osseous structures are unremarkable in appearance. Sinuses/Orbits: The visualized portions of the orbits are within normal limits. The paranasal sinuses and mastoid air cells are well-aerated. Other: No significant soft tissue abnormalities are seen. IMPRESSION: 1. No acute intracranial pathology seen on CT. 2. Mild cortical volume loss and scattered small vessel ischemic microangiopathy. Electronically Signed   By: Garald Balding M.D.   On: 07/01/2016 06:17   Mr Brain Wo Contrast  Result Date: 07/01/2016 CLINICAL DATA:  Initial evaluation for acute altered mental status. EXAM: MRI HEAD WITHOUT CONTRAST TECHNIQUE: Multiplanar, multiecho pulse sequences of the brain and surrounding structures were obtained without intravenous contrast. COMPARISON:  Prior CT from 06/30/2016. FINDINGS: Diffuse prominence of the CSF containing spaces is compatible with generalized cerebral atrophy. Mild scattered T2/FLAIR signal abnormality within the periventricular and deep white matter, most likely related chronic microvascular ischemic disease, minimal for age. No abnormal foci of restricted diffusion to suggest acute ischemia. Gray-white matter differentiation maintained. Major  intracranial vascular flow voids are preserved. Right vertebral artery hypoplastic. No evidence for acute or chronic intracranial hemorrhage. No areas of chronic infarction. No mass lesion, midline shift, or mass effect. No hydrocephalus. No extra-axial fluid collection. Major dural sinuses are grossly patent. Craniocervical junction normal. Visualized upper cervical spine without acute abnormality. Pituitary gland within normal limits. No acute abnormality about the globes and orbits. Mild mucosal thickening within the paranasal sinuses. No air-fluid level to suggest active sinus infection. Trace opacity bilateral mastoid air cells. Inner ear structures grossly normal. Bone marrow signal intensity within normal limits. No scalp soft tissue abnormality. IMPRESSION: Negative brain MRI for patient age. No acute intracranial process identified. Electronically Signed   By: Jeannine Boga M.D.   On: 07/01/2016 05:22   Dg Chest Port 1 View  Result Date: 07/02/2016 CLINICAL DATA:  Mid chest pain and diffuse abdominal pain for the past 2 weeks. EXAM: PORTABLE CHEST 1 VIEW COMPARISON:  06/30/2016. FINDINGS: Stable enlarged cardiac silhouette and linear density in the right upper lung zone. Resolved left basilar atelectasis. Interval small amount of patchy density at the right lung base. The aortic calcifications. Thoracic spine degenerative changes. IMPRESSION: 1. Interval mild right basilar pneumonia or patchy atelectasis. 2. Stable cardiomegaly. 3. Stable linear atelectasis or scarring on the right. 4. Resolved left basilar atelectasis. Electronically Signed   By: Claudie Revering M.D.   On: 07/02/2016 09:47   Dg Abd Portable 1v  Result Date: 07/02/2016 CLINICAL DATA:  Generalized abdominal pain and mid chest pain for  couple weeks. EXAM: PORTABLE ABDOMEN - 1 VIEW COMPARISON:  None. FINDINGS: Two supine views. Cholecystectomy clips. No gaseous distention of bowel loops. No gross free intraperitoneal air. Extensive  vascular calcifications. Right abdominal surgical clips. Right pelvic calcifications measuring 3.2 cm are nonspecific but could relate to dystrophic fibroids. Lumbar spinal curvature and spondylosis. Right hip osteoarthritis. IMPRESSION: No acute findings. Right pelvic calcifications which could relate to underlying dystrophic uterine fibroids but are nonspecific. These results will be called to the ordering clinician or representative by the Radiology Department at the imaging location. Electronically Signed   By: Abigail Miyamoto M.D.   On: 07/02/2016 09:47     ASSESSMENT: 1. CP/indigestion with elevated troponin   --Minimal elevation 0.09->0.07   --Recent cath 9/16 with mild non-obstructive CAD 2. Acute encephalopathy likely due to CO2 narcosis 3. Recent fall 4. Chronic diastolic HF   --volume status looks ok.  5. PNA  6. AKI, resolved.  7. HTN   PLAN/DISCUSSION:  CP is atypical.She feels it is more like indigestion related to Kayexalate. Now resolved. Troponin is minimally elevated. Cath last year with only mild CAD. Doubt CP is cardiac. Likely GI.   Would continue to treat medically. Volume status and rhythm ok. No further cardiac w/u indicated.  Titrate anti-HTN meds as needed. We will sign off. Please call with questions.   Bensimhon, Daniel,MD 2:18 PM

## 2016-07-02 NOTE — Progress Notes (Signed)
Patient complaining of chest pain. PRN nitro given per orders. [facility] MD notified. 12 lead EKG obtained and 6m morphine given per orders. Troponin also to be drawn by lab.

## 2016-07-02 NOTE — Progress Notes (Signed)
Report was given to upcoming nurse. Pt VS are stable.Family at the bedside with belongings

## 2016-07-02 NOTE — Progress Notes (Signed)
PROGRESS NOTE  GENEVIENE Hendricks  IZT:[id number] DOB: 19-Aug-1936  DOA: 06/30/2016 PCP: [name], MD   Brief Narrative:  80 year old female, daughter lives with her, PMH of stage III-4 chronic kidney disease (follows with nephrology), chronic bronchitis/? COPD not on home oxygen or CPAP, HTN, chronic anemia, HLD, chronic diastolic dysfunction (follows with Dr. Candis Musa), former smoker, sustained a fall mid last week, hit her head, evaluated at [facility] and CT head and back said to be unremarkable, fracture of right forearm, seen by outpatient orthopedics on 06/29/16 at which time cast was changed, presented to [facility] ED on 06/30/16 with new onset of confusion since morning of admission, hallucinations. No change in pain medications which are dispensed by daughter. Denies dysuria, urinary frequency or any other symptoms suggestive of infection. In the ED, CT head unremarkable, chest x-ray suggesting possible infiltrates, patient drowsy and hypoxic, placed on BiPAP and admitted to SDU. Confusion better but still having intermittent confusion. Overnight 9/22, developed chest pain, minimal elevation of troponins, chronic LBBB on EKG. Cardiology consulted 9/23. Transfer to telemetry 9/23.   Assessment & Plan:   Principal Problem:   Acute encephalopathy Active Problems:   Essential (primary) hypertension   Hypothyroidism   Renal failure (ARF), acute on chronic (HCC)   Acute respiratory failure with hypoxia (HCC)   Acute encephalopathy  - etiology not entirely clear. - CT head 07/01/16: No acute abnormality. Apparently had a negative head CT couple days ago at [facility].  - MRI brain 07/01/16: Negative brain MRI.  - Urine microscopy: Negative for UTI features.  - Chest x-ray 06/30/16: Right midlung and left basilar airspace opacities likely reflect atelectasis although infection cannot be ruled out. Patient lacks respiratory symptoms such as cough or fever. Repeat chest x-ray 9/21: Interval mild right basilar  pneumonia or patchy atelectasis. Resolved left basilar atelectasis. - Initial ABG had shown hypercapnia/63.4 and acidosis 7.214. This resolved after brief BiPAP (pH 7.388, PCO2 39.4).  - ? Related to worsening renal insufficiency and opioids. TSH normal.  - Ammonia: Normal. Lactate normal. - Temporarily held psychotropic medications (Ativan, Narco, trazodone and Zoloft). Due to ongoing chronic pain issues, anxiety, resume these medications at reduced doses and monitor. Avoiding NSAIDs secondary to renal insufficiency.  Acute hypoxic and hypercapnic respiratory failure - ? Related to opioid pain medications complicating worsening renal insufficiency and?? Community-acquired pneumonia/chronic bronchitis/COPD - Briefly on BiPAP. ABG improved. CO2 has normalized. Oxygen titrated to maintain sats 88-92 percent.  Acute on stage III-4 chronic kidney disease - Gentle IV fluid hydration and follow BMP in a.m. - Creatinine was 1.28 in February 2017. Admitted with creatinine of 2.27. Hold ARB-HCTZ. - Acute kidney injury possibly related to dehydration, ARB and diuretics. Creatinine seems to have returned to baseline 1.37.  Hyperkalemia - Treated for acute kidney injury. Provided a dose of Kayexalate and hyperkalemia resolved. Continue oral bicarbonate.  Chest pain/chronic LBBB/elevated troponin - Overnight 07/01/16, developed chest pain, partial relief with NTG and subsequent relief with IV morphine. EKG sinus rhythm, LBBB (old). Troponin 1:0.09 - Patient sees Dr. Tanda Rockers, Cardiology. Cardiology consulted 9/23, for assistance in management.  Essential hypertension - Continue Imdur and metoprolol. Intermittently uncontrolled-had not received any medications. ARB-HCTZ currently on hold.  Hypothyroid - Continue Synthroid. TSH normal.  Chronic bronchitis/possible COPD/? CAP - No clinical bronchospasm - Continue levofloxacin. Incentive spirometry. Check pro calcitonin. Repeat chest x-ray results  as indicated above  Chronic anemia - Hb has gradually dropped to 8.6 > 8.7 > 8.2. Possibly dilutional. Follow CBC in  a.m. and consider transfusion for hemoglobin <7 g per DL.  Recent right wrist fracture  - Has a cast. Outpatient follow-up with orthopedics.   ? Chronic pain - Resumed home opioids at reduced dose.  - Reported abdominal pain without acute findings on exam. KUB unremarkable. Urine culture negative. Lactate normal.    DVT prophylaxis: Lovenox  Code Status: Full Family Communication: No family at bedside. Disposition Plan: admitted to stepdown unit. Transfer to telemetry on 9/23.    Consultants:   Cardiology-pending  Procedures:   BIPAP  Antimicrobials:   IV Levofloxacin    Subjective: Overnight events noted. Had chest pain, eventually relieved by NTG. Denies current chest pain. Appears slightly confused. Reports abdominal pain, low back pain, right wrist pain. Had multiple BMs after Kayexalate yesterday.    Objective:  Vitals:   07/02/16 0425 07/02/16 0500 07/02/16 0600 07/02/16 0700  BP: 137/78 (!) 150/79 (!) 127/104   Pulse: 64 62 63   Resp: (!) 23 20 (!) 22   Temp:    98.4 F (36.9 C)  TempSrc:    Oral  SpO2: 99% 95% 95%     Intake/Output Summary (Last 24 hours) at 07/02/16 1146 Last data filed at 07/02/16 0400  Gross per 24 hour  Intake             1940 ml  Output                0 ml  Net             1940 ml   There were no vitals filed for this visit.  Examination:  General exam: Pleasant elderly female sitting up comfortably in bed.  Respiratory system: diminished breath sounds in the bases with occasional basal crackles but otherwise clear to auscultation without wheezing or rhonchi. Respiratory effort normal. Cardiovascular system: S1 & S2 heard, RRR. No JVD, murmurs, rubs, gallops or clicks. No pedal edema. telemetry: Sinus rhythm mostly in the 60s. Occasional sinus bradycardia in the 50s. BBB morphology.  Gastrointestinal system:  Abdomen is nondistended, soft and nontender. No organomegaly or masses felt. Normal bowel sounds heard. Central nervous system: Alert and oriented 4 . No focal neurological deficits. Extremities: Symmetric 5 x 5 power. Right wrist cast Skin: No rashes, lesions or ulcers Psychiatry: Judgement and insight appear slightly impaired. Mood & affect appropriate.     Data Reviewed: I have personally reviewed following labs and imaging studies  CBC:  Recent Labs Lab 07/01/16 0059 07/01/16 0122 07/01/16 0655 07/02/16 0433  WBC 9.6  --  9.3 8.4  NEUTROABS 8.3*  --  7.7  --   HGB 8.6* 10.2* 8.7* 8.2*  HCT 33.5* 30.0* 32.9* 31.2*  MCV 84.8  --  83.7 83.9  PLT 202  --  195 151   Basic Metabolic Panel:  Recent Labs Lab 07/01/16 0059 07/01/16 0122 07/01/16 0655 07/01/16 1658 07/02/16 0433  NA 139 140 139 141 142  K 5.5* 5.4* 5.4* 4.5 4.2  CL 105 107 105 109 109  CO2 24  --  _0 GLUCOSE 114* 108* 94 98 97  BUN 35* 37* 36* 35* 33*  CREATININE 2.27* 2.20* 2.21* 1.62* 1.37*  CALCIUM 8.5*  --  8.7* 8.1* 8.9  MG 2.0  --   --   --   --    GFR: Estimated Creatinine Clearance: 32.5 mL/min (by C-G formula based on SCr of 1.37 mg/dL (H)). Liver Function Tests:  Recent Labs Lab 07/01/16 0059 07/01/16  0655  AST 123* 121*  ALT 86* 95*  ALKPHOS 89 88  BILITOT 0.6 0.7  PROT 6.4* 6.6  ALBUMIN 3.8 3.7    Recent Labs Lab 07/01/16 0059  LIPASE 30    Recent Labs Lab 07/01/16 0655  AMMONIA 22   Coagulation Profile:  Recent Labs Lab 07/01/16 0059  INR 1.17   Cardiac Enzymes:  Recent Labs Lab 07/02/16 0441  TROPONINI 0.09*   BNP (last 3 results) No results for input(s): PROBNP in the last 8760 hours. HbA1C: No results for input(s): HGBA1C in the last 72 hours. CBG:  Recent Labs Lab 07/01/16 0832  GLUCAP 92   Lipid Profile: No results for input(s): CHOL, HDL, LDLCALC, TRIG, CHOLHDL, LDLDIRECT in the last 72 hours. Thyroid Function Tests:  Recent  Labs  07/01/16 0655  TSH 0.639   Anemia Panel: No results for input(s): VITAMINB12, FOLATE, FERRITIN, TIBC, IRON, RETICCTPCT in the last 72 hours.  Sepsis Labs:  Recent Labs Lab 07/01/16 0125  LATICACIDVEN 1.41    Recent Results (from the past 240 hour(s))  Urine culture     Status: None   Collection Time: 07/01/16  1:43 AM  Result Value Ref Range Status   Specimen Description URINE, CATHETERIZED  Final   Special Requests NONE  Final   Culture NO GROWTH  Final   Report Status 07/02/2016 FINAL  Final  MRSA PCR Screening     Status: Abnormal   Collection Time: 07/01/16  9:21 AM  Result Value Ref Range Status   MRSA by PCR POSITIVE (A) NEGATIVE Final    Comment:        The GeneXpert MRSA Assay (FDA approved for NASAL specimens only), is one component of a comprehensive MRSA colonization surveillance program. It is not intended to diagnose MRSA infection nor to guide or monitor treatment for MRSA infections. RESULT CALLED TO, READ BACK BY AND VERIFIED WITH: Maryjo Rochester RN 11:50 07/01/16 ([email or web address])          Radiology Studies: Dg Chest 2 View  Result Date: 07/01/2016 CLINICAL DATA:  Acute onset of difficulty ambulating. Altered mental status. Initial encounter. EXAM: CHEST  2 VIEW COMPARISON:  Chest radiograph performed 04/14/2016 FINDINGS: The lungs are well-aerated. Right midlung and left basilar opacities likely reflect atelectasis, though mild infection might have a similar appearance. There is no evidence of pleural effusion or pneumothorax. The heart is enlarged.  No acute osseous abnormalities are seen. IMPRESSION: Cardiomegaly. Right midlung and left basilar airspace opacities likely reflect atelectasis, though mild infection might have a similar appearance, depending on the patient's symptoms. Electronically Signed   By: Garald Balding M.D.   On: 07/01/2016 06:11   Ct Head Wo Contrast  Result Date: 07/01/2016 CLINICAL DATA:  Acute onset of altered mental status.  Recent fall. Initial encounter. EXAM: CT HEAD WITHOUT CONTRAST TECHNIQUE: Contiguous axial images were obtained from the base of the skull through the vertex without intravenous contrast. COMPARISON:  CT of the head performed 02/17/2014 FINDINGS: Brain: No evidence of acute infarction, hemorrhage, hydrocephalus, extra-axial collection or mass lesion/mass effect. Prominence of the ventricles and sulci reflects mild cortical volume loss. Scattered periventricular and subcortical white matter change likely reflects small vessel ischemic microangiopathy. The brainstem and fourth ventricle are within normal limits. The basal ganglia are unremarkable in appearance. The cerebral hemispheres demonstrate grossly normal gray-white differentiation. No mass effect or midline shift is seen. Vascular: No hyperdense vessel or unexpected calcification. Skull: There is no evidence of fracture; visualized osseous structures are  unremarkable in appearance. Sinuses/Orbits: The visualized portions of the orbits are within normal limits. The paranasal sinuses and mastoid air cells are well-aerated. Other: No significant soft tissue abnormalities are seen. IMPRESSION: 1. No acute intracranial pathology seen on CT. 2. Mild cortical volume loss and scattered small vessel ischemic microangiopathy. Electronically Signed   By: Garald Balding M.D.   On: 07/01/2016 06:17   Mr Brain Wo Contrast  Result Date: 07/01/2016 CLINICAL DATA:  Initial evaluation for acute altered mental status. EXAM: MRI HEAD WITHOUT CONTRAST TECHNIQUE: Multiplanar, multiecho pulse sequences of the brain and surrounding structures were obtained without intravenous contrast. COMPARISON:  Prior CT from 06/30/2016. FINDINGS: Diffuse prominence of the CSF containing spaces is compatible with generalized cerebral atrophy. Mild scattered T2/FLAIR signal abnormality within the periventricular and deep white matter, most likely related chronic microvascular ischemic disease,  minimal for age. No abnormal foci of restricted diffusion to suggest acute ischemia. Gray-white matter differentiation maintained. Major intracranial vascular flow voids are preserved. Right vertebral artery hypoplastic. No evidence for acute or chronic intracranial hemorrhage. No areas of chronic infarction. No mass lesion, midline shift, or mass effect. No hydrocephalus. No extra-axial fluid collection. Major dural sinuses are grossly patent. Craniocervical junction normal. Visualized upper cervical spine without acute abnormality. Pituitary gland within normal limits. No acute abnormality about the globes and orbits. Mild mucosal thickening within the paranasal sinuses. No air-fluid level to suggest active sinus infection. Trace opacity bilateral mastoid air cells. Inner ear structures grossly normal. Bone marrow signal intensity within normal limits. No scalp soft tissue abnormality. IMPRESSION: Negative brain MRI for patient age. No acute intracranial process identified. Electronically Signed   By: Jeannine Boga M.D.   On: 07/01/2016 05:22   Dg Chest Port 1 View  Result Date: 07/02/2016 CLINICAL DATA:  Mid chest pain and diffuse abdominal pain for the past 2 weeks. EXAM: PORTABLE CHEST 1 VIEW COMPARISON:  06/30/2016. FINDINGS: Stable enlarged cardiac silhouette and linear density in the right upper lung zone. Resolved left basilar atelectasis. Interval small amount of patchy density at the right lung base. The aortic calcifications. Thoracic spine degenerative changes. IMPRESSION: 1. Interval mild right basilar pneumonia or patchy atelectasis. 2. Stable cardiomegaly. 3. Stable linear atelectasis or scarring on the right. 4. Resolved left basilar atelectasis. Electronically Signed   By: Claudie Revering M.D.   On: 07/02/2016 09:47   Dg Abd Portable 1v  Result Date: 07/02/2016 CLINICAL DATA:  Generalized abdominal pain and mid chest pain for couple weeks. EXAM: PORTABLE ABDOMEN - 1 VIEW COMPARISON:   None. FINDINGS: Two supine views. Cholecystectomy clips. No gaseous distention of bowel loops. No gross free intraperitoneal air. Extensive vascular calcifications. Right abdominal surgical clips. Right pelvic calcifications measuring 3.2 cm are nonspecific but could relate to dystrophic fibroids. Lumbar spinal curvature and spondylosis. Right hip osteoarthritis. IMPRESSION: No acute findings. Right pelvic calcifications which could relate to underlying dystrophic uterine fibroids but are nonspecific. These results will be called to the ordering clinician or representative by the Radiology Department at the imaging location. Electronically Signed   By: Abigail Miyamoto M.D.   On: 07/02/2016 09:47        Scheduled Meds: . aspirin  81 mg Oral Daily  . Chlorhexidine Gluconate Cloth  6 each Topical Q0600  . clopidogrel  75 mg Oral Daily  . enoxaparin (LOVENOX) injection  30 mg Subcutaneous Q24H  . fluticasone  2 spray Each Nare Daily  . isosorbide mononitrate  30 mg Oral Daily  . levofloxacin (  LEVAQUIN) IV  500 mg Intravenous Q48H  . levothyroxine  75 mcg Oral QAC breakfast  . loratadine  10 mg Oral Daily  . mouth rinse  15 mL Mouth Rinse BID  . metoprolol  100 mg Oral BID  . mometasone-formoterol  2 puff Inhalation BID  . mupirocin ointment  1 application Nasal BID  . pantoprazole  40 mg Oral Daily  . sertraline  25 mg Oral Daily  . simvastatin  20 mg Oral q1800  . sodium bicarbonate  650 mg Oral TID  . tiotropium  18 mcg Inhalation Daily   Continuous Infusions:     LOS: 1 day    Time spent: 35 minutes.    [facility], MD [facility] Pager [phone]  If 7PM-7AM, please contact night-coverage [email or web address] Password [facility] 07/02/2016, 11:46 AM

## 2016-07-03 LAB — BASIC METABOLIC PANEL
Anion gap: 7 (ref 5–15)
BUN: 25 mg/dL — AB (ref 6–20)
CO2: 28 mmol/L (ref 22–32)
Calcium: 9.2 mg/dL (ref 8.9–10.3)
Chloride: 105 mmol/L (ref 101–111)
Creatinine, Ser: 0.92 mg/dL (ref 0.44–1.00)
GFR calc Af Amer: >60 mL/min (ref >60)
GFR, EST NON AFRICAN AMERICAN: 58 mL/min — AB (ref >60)
GLUCOSE: 100 mg/dL — AB (ref 65–99)
POTASSIUM: 4.2 mmol/L (ref 3.5–5.1)
Sodium: 140 mmol/L (ref 135–145)

## 2016-07-03 LAB — CBC
HEMATOCRIT: 30.8 % — AB (ref 36.0–46.0)
HEMOGLOBIN: 8.3 g/dL — AB (ref 12.0–15.0)
MCH: 22.2 pg — AB (ref 26.0–34.0)
MCHC: 26.9 g/dL — AB (ref 30.0–36.0)
MCV: 82.4 fL (ref 78.0–100.0)
Platelets: 191 10*3/uL (ref 150–400)
RBC: 3.74 MIL/uL — ABNORMAL LOW (ref 3.87–5.11)
RDW: 19.9 % — AB (ref 11.5–15.5)
WBC: 8.5 10*3/uL (ref 4.0–10.5)

## 2016-07-03 MED ORDER — AMLODIPINE BESYLATE 5 MG PO TABS
5.0000 mg | ORAL_TABLET | Freq: Every day | ORAL | Status: DC
  Administered 2016-07-03 – 2016-07-04 (×2): 5 mg via ORAL
  Filled 2016-07-03 (×2): qty 1

## 2016-07-03 MED ORDER — ALUM & MAG HYDROXIDE-SIMETH 200-200-20 MG/5ML PO SUSP
30.0000 mL | Freq: Four times a day (QID) | ORAL | Status: DC | PRN
  Administered 2016-07-05: 30 mL via ORAL
  Filled 2016-07-03: qty 30

## 2016-07-03 MED ORDER — LEVOFLOXACIN IN D5W 500 MG/100ML IV SOLN
500.0000 mg | INTRAVENOUS | Status: DC
  Administered 2016-07-04: 500 mg via INTRAVENOUS
  Filled 2016-07-03: qty 100

## 2016-07-03 NOTE — Progress Notes (Signed)
Pharmacy Antibiotic Note  Carolyn Hendricks is a 80 y.o. female admitted on 06/30/2016 with pneumonia.  Pharmacy has been consulted for Levofloxacin dosing.  Cr has improved  Plan: Change Levofloxacin to 546m IV q24  Height: 5' (152.4 cm) IBW/kg (Calculated) : 45.5  Temp (24hrs), Avg:97.8 F (36.6 C), Min:97.8 F (36.6 C), Max:97.9 F (36.6 C)   Recent Labs Lab 07/01/16 0059 07/01/16 0122 07/01/16 0125 07/01/16 0655 07/01/16 1658 07/02/16 0433 07/03/16 0423  WBC 9.6  --   --  9.3  --  8.4 8.5  CREATININE 2.27* 2.20*  --  2.21* 1.62* 1.37* 0.92  LATICACIDVEN  --   --  1.41  --   --   --   --     Estimated Creatinine Clearance: 48.5 mL/min (by C-G formula based on SCr of 0.92 mg/dL).    Allergies  Allergen Reactions  . Atorvastatin Other (See Comments)    Other reaction(s): Muscle Pain  . Crestor [Rosuvastatin Calcium] Other (See Comments)    Other reaction(s): Muscle Pain  . Tape Other (See Comments)    Makes a bruise on my skin. Paper tape is ok to use.  . Pravastatin Sodium Other (See Comments)    Muscle pain     Thank you for allowing pharmacy to be a part of this patient's care.  KGracy Bruins PharmD Clinical Pharmacist [facility]

## 2016-07-03 NOTE — Evaluation (Signed)
Physical Therapy Evaluation Patient Details Name: Carolyn Hendricks MRN: [id number] DOB: August 29, 1936 Today's Date: 07/03/2016   History of Present Illness  Patient is a 80 yo female admitted 06/30/16 with acute encephalopathy, HTN.    PMH:  CKD, COPD, HTN, anemia, CHF, fall week pta - hit head and fx Rt forearm in cast  Clinical Impression  Patient presents with problems listed below.  Will benefit from acute PT to maximize functional independence prior to return home with daughter.  Patient with general weakness and decreased balance impacting mobility/gait.  Recommend HHPT at d/c for continued therapy.    Follow Up Recommendations Home health PT;Supervision/Assistance - 24 hour    Equipment Recommendations  3in1 (PT)    Recommendations for Other Services       Precautions / Restrictions Precautions Precautions: Fall Precaution Comments: Fall pta Required Braces or Orthoses: Other Brace/Splint Other Brace/Splint: Cast Rt forearm/wrist Restrictions Weight Bearing Restrictions: No (None ordered)      Mobility  Bed Mobility               General bed mobility comments: Patient sitting in chair  Transfers Overall transfer level: Needs assistance Equipment used: 2 person hand held assist Transfers: Sit to/from Stand Sit to Stand: Min assist;+2 safety/equipment         General transfer comment: Verbal cues for technique.  Assist to rise to standing and to minimize use of RUE.  Patient able to stand x2 and step in place 10 steps each foot.   Ambulation/Gait Ambulation/Gait assistance: Min assist Ambulation Distance (Feet): 2 Feet Assistive device: 1 person hand held assist Gait Pattern/deviations: Step-through pattern;Decreased stride length;Shuffle     General Gait Details: Patient able to ambulate forward and backward 4 steps each with hand hold assist.  Requires UE support for balance.  Stairs            Wheelchair Mobility    Modified Rankin (Stroke  Patients Only)       Balance Overall balance assessment: Needs assistance         Standing balance support: Single extremity supported Standing balance-Leahy Scale: Poor Standing balance comment: Requires UE support for balance in stance                             Pertinent Vitals/Pain Pain Assessment: 0-10 Pain Score: 8  Pain Location: Low back Pain Descriptors / Indicators: Aching;Sore Pain Intervention(s): Limited activity within patient's tolerance;Monitored during session;Repositioned    Home Living Family/patient expects to be discharged to:: Private residence Living Arrangements: Children Available Help at Discharge: Family Type of Home: House Home Access: Stairs to enter Entrance Stairs-Rails: None Entrance Stairs-Number of Steps: 1 Home Layout: One level Home Equipment: Environmental consultant - 4 wheels;Cane - single point;Shower seat      Prior Function Level of Independence: Independent with assistive device(s);Needs assistance   Gait / Transfers Assistance Needed: Ambulates with rollator  ADL's / Homemaking Assistance Needed: Daughter assists with meal prep, housekeeping        Hand Dominance   Dominant Hand: Right    Extremity/Trunk Assessment   Upper Extremity Assessment: RUE deficits/detail RUE Deficits / Details: Cast on Rt wrist/forearm for fracture.         Lower Extremity Assessment: Generalized weakness      Cervical / Trunk Assessment: Other exceptions  Communication   Communication: No difficulties  Cognition Arousal/Alertness: Awake/alert Behavior During Therapy: WFL for tasks assessed/performed Overall Cognitive Status: Within  Functional Limits for tasks assessed                      General Comments      Exercises     Assessment/Plan    PT Assessment Patient needs continued PT services  PT Problem List Decreased strength;Decreased activity tolerance;Decreased balance;Decreased mobility;Decreased knowledge of  use of DME;Cardiopulmonary status limiting activity;Pain          PT Treatment Interventions DME instruction;Gait training;Functional mobility training;Therapeutic activities;Therapeutic exercise;Patient/family education    PT Goals (Current goals can be found in the Care Plan section)  Acute Rehab PT Goals Patient Stated Goal: To go home soon PT Goal Formulation: With patient Time For Goal Achievement: 07/10/16 Potential to Achieve Goals: Good    Frequency Min 3X/week   Barriers to discharge        Co-evaluation               End of Session Equipment Utilized During Treatment: Gait belt;Oxygen Activity Tolerance: Patient limited by fatigue;Patient limited by pain Patient left: in chair;with call bell/phone within reach           Time: 1355-1412 PT Time Calculation (min) (ACUTE ONLY): 17 min   Charges:   PT Evaluation $PT Eval High Complexity: 1 Procedure     PT G Codes:        Despina Pole 2016/07/16, 4:25 PM Carita Pian. Sanjuana Kava, [place] Pager [phone]

## 2016-07-03 NOTE — Progress Notes (Signed)
Hydralazine 10 mg prn given at 0023 and at 4680 for systolic > 321.

## 2016-07-03 NOTE — Progress Notes (Addendum)
PROGRESS NOTE  Carolyn Hendricks  ZOX:[id number] DOB: Apr 18, 1936  DOA: 06/30/2016 PCP: [name], MD   Brief Narrative:  80 year old female, daughter lives with her, PMH of stage III-4 chronic kidney disease (follows with nephrology), chronic bronchitis/? COPD not on home oxygen or CPAP, HTN, chronic anemia, HLD, chronic diastolic dysfunction (follows with Dr. Candis Musa), former smoker, sustained a fall mid last week, hit her head, evaluated at [facility] and CT head and back said to be unremarkable, fracture of right forearm, seen by outpatient orthopedics on 06/29/16 at which time cast was changed, presented to [facility] ED on 06/30/16 with new onset of confusion since morning of admission, hallucinations. No change in pain medications which are dispensed by daughter. Denies dysuria, urinary frequency or any other symptoms suggestive of infection. In the ED, CT head unremarkable, chest x-ray suggesting possible infiltrates, patient drowsy and hypoxic, placed on BiPAP and admitted to SDU. Confusion better but still having intermittent confusion. Overnight 9/22, developed chest pain, minimal elevation of troponins, chronic LBBB on EKG. Cardiology consulted 9/23. Transfer to telemetry 9/23.   Assessment & Plan:   Principal Problem:   Acute encephalopathy Active Problems:   Essential (primary) hypertension   Hypothyroidism   Renal failure (ARF), acute on chronic (HCC)   Acute respiratory failure with hypoxia (HCC)   Acute encephalopathy  - etiology not entirely clear. ? Related to worsening renal insufficiency and opioids.  - CT head 07/01/16: No acute abnormality. Apparently had a negative head CT couple days ago at [facility].  - MRI brain 07/01/16: Negative brain MRI.  - Urine microscopy: Negative for UTI features.  - Chest x-ray 06/30/16: Right midlung and left basilar airspace opacities likely reflect atelectasis although infection cannot be ruled out. Patient lacks respiratory symptoms such as cough or fever.  Repeat chest x-ray 9/21: Interval mild right basilar pneumonia or patchy atelectasis. Resolved left basilar atelectasis. - Initial ABG had shown hypercapnia/63.4 and acidosis 7.214. This resolved after brief BiPAP (pH 7.388, PCO2 39.4).  - TSH normal.  - Ammonia: Normal. Lactate normal. - Temporarily held psychotropic medications (Ativan, Narco, trazodone and Zoloft). Due to ongoing chronic pain issues, anxiety, resumed these medications at reduced doses and monitor. Avoiding NSAIDs secondary to renal insufficiency. - As per discussion with patient's daughter-in-law on 9/24, patient apparently gets slightly confused when she takes her pain medications at home on an average up to 3 times per day. According to her, patient was quite coherent yesterday. - Continue to monitor.  Acute hypoxic and hypercapnic respiratory failure - ? Related to opioid pain medications complicating worsening renal insufficiency and?? Community-acquired pneumonia/chronic bronchitis/COPD - Briefly on BiPAP. ABG improved. CO2 has normalized. Oxygen titrated to maintain sats 88-92 percent.  Acute on stage III-4 chronic kidney disease - Gentle IV fluid hydration and follow BMP in a.m. - Creatinine was 1.28 in February 2017. Admitted with creatinine of 2.27. Hold ARB-HCTZ. - Acute kidney injury possibly related to dehydration, ARB and diuretics. Creatinine has normalized.  Hyperkalemia - Treated for acute kidney injury. Provided a dose of Kayexalate and hyperkalemia resolved. Continue oral bicarbonate.  Chest pain/chronic LBBB/elevated troponin - Overnight 07/01/16, developed chest pain, partial relief with NTG and subsequent relief with IV morphine. EKG sinus rhythm, LBBB (old). Troponin 1:0.09 - Patient sees Dr. Tanda Rockers, Cardiology. Cardiology consultation 07/02/16 appreciated: Recent 9/16 with mild nonobstructive CAD. Chest pain felt to be atypical related to indigestion from Kayexalate. Doubt chest pain was cardiac.  Likely GI. No further cardiac workup recommended.  Essential hypertension -  Continue Imdur and metoprolol. ARB-HCTZ currently on hold due to recent acute kidney injury. Uncontrolled. Added amlodipine 5 MG daily on 9/24.  Hypothyroid - Continue Synthroid. TSH normal.  Chronic bronchitis/possible COPD/? CAP - No clinical bronchospasm - Continue levofloxacin. Incentive spirometry.  Repeat chest x-ray results as indicated above  Chronic anemia - Hb has gradually dropped to 8.6 > 8.7 > 8.2. Possibly dilutional. Hemoglobin stable in the low 8 g per DL range. Follow CBC in a.m. and consider transfusion for hemoglobin <7 g per DL.  Recent right wrist fracture  - Has a cast. Outpatient follow-up with orthopedics.   Chronic pain - Resumed home opioids at reduced dose.  - Reported abdominal pain without acute findings on exam. KUB unremarkable. Urine culture negative. Lactate normal. - As per discussion with family, patient has generalized body aches but mostly in the low back. Continues to complain of this pain and complaints of abdominal pain but unable to elaborate. Lipase normal. Add Maalox and PPI.    DVT prophylaxis: Lovenox  Code Status: Full Family Communication: No family at bedside. Disposition Plan: admitted to stepdown unit. Transferred to telemetry on 9/23. OOB. Await PT eval.   Consultants:   Cardiology  Procedures:   BIPAP  Antimicrobials:   IV Levofloxacin    Subjective: Patient had just received pain medications short while prior to the visit. Appeared slightly drowsy and confused. Complained of generalized body pains and abdominal pain. No nausea, vomiting or diarrhea.  Objective:  Vitals:   07/03/16 0023 07/03/16 0400 07/03/16 0600 07/03/16 0800  BP: (!) 174/101 (!) 163/73 (!) 177/77 (!) 189/68  Pulse: 63 63 64 67  Resp: 19 (!) _0 Temp: 97.9 F (36.6 C) 97.8 F (36.6 C)  97.8 F (36.6 C)  TempSrc: Oral Oral  Oral  SpO2: 91% 94% 96% 95%    Height:        Intake/Output Summary (Last 24 hours) at 07/03/16 1108 Last data filed at 07/02/16 2202  Gross per 24 hour  Intake              120 ml  Output              800 ml  Net             -680 ml   There were no vitals filed for this visit.  Examination:  General exam: Pleasant elderly female Lying comfortably supine in bed.  Respiratory system: diminished breath sounds in the bases with occasional basal crackles but otherwise clear to auscultation without wheezing or rhonchi. Respiratory effort normal. Cardiovascular system: S1 & S2 heard, RRR. No JVD, murmurs, rubs, gallops or clicks. No pedal edema. telemetry: Sinus rhythm mostly in the 60's with BBB morphology. Gastrointestinal system: Abdomen is nondistended, soft and nontender. No organomegaly or masses felt. Normal bowel sounds heard. Central nervous system: Slightly drowsy but easily arousable and oriented. . No focal neurological deficits. Extremities: Symmetric 5 x 5 power. Right wrist cast Skin: No rashes, lesions or ulcers Psychiatry: Judgement and insight appear slightly impaired. Mood & affect appropriate.     Data Reviewed: I have personally reviewed following labs and imaging studies  CBC:  Recent Labs Lab 07/01/16 0059 07/01/16 0122 07/01/16 0655 07/02/16 0433 07/03/16 0423  WBC 9.6  --  9.3 8.4 8.5  NEUTROABS 8.3*  --  7.7  --   --   HGB 8.6* 10.2* 8.7* 8.2* 8.3*  HCT 33.5* 30.0* 32.9* 31.2* 30.8*  MCV 84.8  --  83.7 83.9 82.4  PLT 202  --  195 176 572   Basic Metabolic Panel:  Recent Labs Lab 07/01/16 0059 07/01/16 0122 07/01/16 0655 07/01/16 1658 07/02/16 0433 07/03/16 0423  NA 139 140 139 141 142 140  K 5.5* 5.4* 5.4* 4.5 4.2 4.2  CL 105 107 105 109 109 105  CO2 24  --  _0 GLUCOSE 114* 108* 94 98 97 100*  BUN 35* 37* 36* 35* 33* 25*  CREATININE 2.27* 2.20* 2.21* 1.62* 1.37* 0.92  CALCIUM 8.5*  --  8.7* 8.1* 8.9 9.2  MG 2.0  --   --   --   --   --    GFR: Estimated  Creatinine Clearance: 48.5 mL/min (by C-G formula based on SCr of 0.92 mg/dL). Liver Function Tests:  Recent Labs Lab 07/01/16 0059 07/01/16 0655  AST 123* 121*  ALT 86* 95*  ALKPHOS 89 88  BILITOT 0.6 0.7  PROT 6.4* 6.6  ALBUMIN 3.8 3.7    Recent Labs Lab 07/01/16 0059  LIPASE 30    Recent Labs Lab 07/01/16 0655  AMMONIA 22   Coagulation Profile:  Recent Labs Lab 07/01/16 0059  INR 1.17   Cardiac Enzymes:  Recent Labs Lab 07/02/16 0441 07/02/16 1039  TROPONINI 0.09* 0.07*   BNP (last 3 results) No results for input(s): PROBNP in the last 8760 hours. HbA1C: No results for input(s): HGBA1C in the last 72 hours. CBG:  Recent Labs Lab 07/01/16 0832  GLUCAP 92   Lipid Profile: No results for input(s): CHOL, HDL, LDLCALC, TRIG, CHOLHDL, LDLDIRECT in the last 72 hours. Thyroid Function Tests:  Recent Labs  07/01/16 0655  TSH 0.639   Anemia Panel: No results for input(s): VITAMINB12, FOLATE, FERRITIN, TIBC, IRON, RETICCTPCT in the last 72 hours.  Sepsis Labs:  Recent Labs Lab 07/01/16 0125  LATICACIDVEN 1.41    Recent Results (from the past 240 hour(s))  Urine culture     Status: None   Collection Time: 07/01/16  1:43 AM  Result Value Ref Range Status   Specimen Description URINE, CATHETERIZED  Final   Special Requests NONE  Final   Culture NO GROWTH  Final   Report Status 07/02/2016 FINAL  Final  MRSA PCR Screening     Status: Abnormal   Collection Time: 07/01/16  9:21 AM  Result Value Ref Range Status   MRSA by PCR POSITIVE (A) NEGATIVE Final    Comment:        The GeneXpert MRSA Assay (FDA approved for NASAL specimens only), is one component of a comprehensive MRSA colonization surveillance program. It is not intended to diagnose MRSA infection nor to guide or monitor treatment for MRSA infections. RESULT CALLED TO, READ BACK BY AND VERIFIED WITH: Maryjo Rochester RN 11:50 07/01/16 ([email or web address])          Radiology Studies: Dg  Chest Port 1 View  Result Date: 07/02/2016 CLINICAL DATA:  Mid chest pain and diffuse abdominal pain for the past 2 weeks. EXAM: PORTABLE CHEST 1 VIEW COMPARISON:  06/30/2016. FINDINGS: Stable enlarged cardiac silhouette and linear density in the right upper lung zone. Resolved left basilar atelectasis. Interval small amount of patchy density at the right lung base. The aortic calcifications. Thoracic spine degenerative changes. IMPRESSION: 1. Interval mild right basilar pneumonia or patchy atelectasis. 2. Stable cardiomegaly. 3. Stable linear atelectasis or scarring on the right. 4. Resolved left basilar atelectasis. Electronically Signed   By: Claudie Revering M.D.   On:  07/02/2016 09:47   Dg Abd Portable 1v  Result Date: 07/02/2016 CLINICAL DATA:  Generalized abdominal pain and mid chest pain for couple weeks. EXAM: PORTABLE ABDOMEN - 1 VIEW COMPARISON:  None. FINDINGS: Two supine views. Cholecystectomy clips. No gaseous distention of bowel loops. No gross free intraperitoneal air. Extensive vascular calcifications. Right abdominal surgical clips. Right pelvic calcifications measuring 3.2 cm are nonspecific but could relate to dystrophic fibroids. Lumbar spinal curvature and spondylosis. Right hip osteoarthritis. IMPRESSION: No acute findings. Right pelvic calcifications which could relate to underlying dystrophic uterine fibroids but are nonspecific. These results will be called to the ordering clinician or representative by the Radiology Department at the imaging location. Electronically Signed   By: Abigail Miyamoto M.D.   On: 07/02/2016 09:47        Scheduled Meds: . amLODipine  5 mg Oral Daily  . aspirin  81 mg Oral Daily  . Chlorhexidine Gluconate Cloth  6 each Topical Q0600  . clopidogrel  75 mg Oral Daily  . enoxaparin (LOVENOX) injection  30 mg Subcutaneous Q24H  . fluticasone  2 spray Each Nare Daily  . isosorbide mononitrate  30 mg Oral Daily  . levofloxacin (LEVAQUIN) IV  500 mg  Intravenous Q48H  . levothyroxine  75 mcg Oral QAC breakfast  . loratadine  10 mg Oral Daily  . mouth rinse  15 mL Mouth Rinse BID  . metoprolol  100 mg Oral BID  . mometasone-formoterol  2 puff Inhalation BID  . mupirocin ointment  1 application Nasal BID  . pantoprazole  40 mg Oral Daily  . sertraline  25 mg Oral Daily  . simvastatin  20 mg Oral q1800  . sodium bicarbonate  650 mg Oral TID  . tiotropium  18 mcg Inhalation Daily   Continuous Infusions:     LOS: 2 days    Time spent: 35 minutes.    [facility], MD [facility] Pager [phone]  If 7PM-7AM, please contact night-coverage [email or web address] Password [email or web address] 07/03/2016, 11:08 AM

## 2016-07-04 DIAGNOSIS — E038 Other specified hypothyroidism: Secondary | ICD-10-CM

## 2016-07-04 MED ORDER — LEVOFLOXACIN 500 MG PO TABS
500.0000 mg | ORAL_TABLET | Freq: Every day | ORAL | Status: DC
  Administered 2016-07-05: 500 mg via ORAL
  Filled 2016-07-04: qty 1

## 2016-07-04 MED ORDER — AMLODIPINE BESYLATE 10 MG PO TABS
10.0000 mg | ORAL_TABLET | Freq: Every day | ORAL | Status: DC
  Administered 2016-07-05: 10 mg via ORAL
  Filled 2016-07-04: qty 1

## 2016-07-04 MED ORDER — AMLODIPINE BESYLATE 5 MG PO TABS
5.0000 mg | ORAL_TABLET | Freq: Once | ORAL | Status: AC
  Administered 2016-07-04: 5 mg via ORAL
  Filled 2016-07-04: qty 1

## 2016-07-04 NOTE — Progress Notes (Addendum)
PROGRESS NOTE  Carolyn Hendricks  UUV:[id number] DOB: 1936/07/11  DOA: 06/30/2016 PCP: Mila Merry, MD   Brief Narrative:  80 year old female, daughter lives with her, PMH of stage III-4 chronic kidney disease (follows with nephrology), chronic bronchitis/? COPD not on home oxygen or CPAP, HTN, chronic anemia, HLD, chronic diastolic dysfunction (follows with Dr. Lewie Loron), former smoker, sustained a fall mid last week, hit her head, evaluated at [facility] and CT head and back said to be unremarkable, fracture of right forearm, seen by outpatient orthopedics on 06/29/16 at which time cast was changed, presented to [facility] ED on 06/30/16 with new onset of confusion since morning of admission, hallucinations. No change in pain medications which are dispensed by daughter. Denies dysuria, urinary frequency or any other symptoms suggestive of infection. In the ED, CT head unremarkable, chest x-ray suggesting possible infiltrates, patient drowsy and hypoxic, placed on BiPAP and admitted to SDU. Confusion better but still having intermittent confusion. Overnight 9/22, developed chest pain, minimal elevation of troponins, chronic LBBB on EKG. Cardiology consulted 9/23. Transfer to telemetry 9/23.   Assessment & Plan:   Principal Problem:   Acute encephalopathy Active Problems:   Essential (primary) hypertension   Hypothyroidism   Renal failure (ARF), acute on chronic (HCC)   Acute respiratory failure with hypoxia (HCC)   Acute encephalopathy  - etiology not entirely clear. ? Related to worsening renal insufficiency and opioids.  - CT head 07/01/16: No acute abnormality. Apparently had a negative head CT couple days ago at [facility].  - MRI brain 07/01/16: Negative brain MRI.  - Urine microscopy: Negative for UTI features.  - Chest x-ray 06/30/16: Right midlung and left basilar airspace opacities likely reflect atelectasis although infection cannot be ruled out. Patient lacks respiratory symptoms such as cough or fever.  Repeat chest x-ray 9/21: Interval mild right basilar pneumonia or patchy atelectasis. Resolved left basilar atelectasis. - Initial ABG had shown hypercapnia/63.4 and acidosis 7.214. This resolved after brief BiPAP (pH 7.388, PCO2 39.4).  - TSH normal.  - Ammonia: Normal. Lactate normal. - Temporarily held psychotropic medications (Ativan, Narco, trazodone and Zoloft). Due to ongoing chronic pain issues, anxiety, resumed these medications at reduced doses and monitor. Avoiding NSAIDs secondary to renal insufficiency. - As per discussion with patient's daughter-in-law on 9/24, patient apparently gets slightly confused when she takes her pain medications at home on an average up to 3 times per day. According to her, patient was quite coherent yesterday. - Continue to monitor. Mental status changes resolved.  Acute hypoxic and hypercapnic respiratory failure - ? Related to opioid pain medications complicating worsening renal insufficiency and?? Community-acquired pneumonia/chronic bronchitis/COPD - Briefly on BiPAP. ABG improved. CO2 has normalized. Oxygen titrated to maintain sats 88-92 percent. - Discussed with PT on 9/25: Desaturates to 86% on room air with minimal activity. Improved on oxygen 1 L/m. Likely will need home oxygen. PT recommends SNF due to nonweightbearing on right wrist, oxygen requiring hypoxia.  Acute on stage III-4 chronic kidney disease - Gentle IV fluid hydration and follow BMP in a.m. - Creatinine was 1.28 in February 2017. Admitted with creatinine of 2.27. Hold ARB-HCTZ. - Acute kidney injury possibly related to dehydration, ARB and diuretics. Creatinine has normalized.  Hyperkalemia - Treated for acute kidney injury. Provided a dose of Kayexalate and hyperkalemia resolved. Continue oral bicarbonate.  Chest pain/chronic LBBB/elevated troponin - Overnight 07/01/16, developed chest pain, partial relief with NTG and subsequent relief with IV morphine. EKG sinus rhythm, LBBB  (old). Troponin 1:0.09 - Patient  sees Dr. Gunnar Bulla, Cardiology. Cardiology consultation 07/02/16 appreciated: Recent 9/16 with mild nonobstructive CAD. Chest pain felt to be atypical related to indigestion from Kayexalate. Doubt chest pain was cardiac. Likely GI. No further cardiac workup recommended.  Essential hypertension - Continue Imdur and metoprolol. ARB-HCTZ currently on hold due to recent acute kidney injury. Uncontrolled. Added amlodipine 5 MG daily on 9/24 >increased to 10 MG daily on 9/25.  Hypothyroid - Continue Synthroid. TSH normal.  Chronic bronchitis/possible COPD/? CAP - No clinical bronchospasm - Continue levofloxacin. Incentive spirometry.  Repeat chest x-ray results as indicated above  Chronic anemia - Hb has gradually dropped to 8.6 > 8.7 > 8.2. Possibly dilutional. Hemoglobin stable in the low 8 g per DL range. Follow CBC in a.m. and consider transfusion for hemoglobin <7 g per DL.  Recent right wrist fracture  - Has a cast. Outpatient follow-up with orthopedics. Non weight bearing per PT  Chronic pain - Resumed home opioids at reduced dose.  - Reported abdominal pain without acute findings on exam. KUB unremarkable. Urine culture negative. Lactate normal. - As per discussion with family, patient has generalized body aches but mostly in the low back. Continues to complain of this pain and complaints of abdominal pain but unable to elaborate. Lipase normal. Add Maalox and PPI.    DVT prophylaxis: Lovenox  Code Status: Full Family Communication: No family at bedside. Disposition Plan: admitted to stepdown unit. Transferred to telemetry on 9/23. OOB. As per PT, SNF-DC when bed available.   Consultants:   Cardiology  Procedures:   BIPAP  Antimicrobials:   IV Levofloxacin    Subjective: As per patient, she did not sleep well last night-no particular reason. As per RN, no acute issues.  Objective:  Vitals:   07/04/16 0258 07/04/16 0405 07/04/16  0600 07/04/16 0812  BP: (!) 178/74 (!) 155/68 (!) 161/66 (!) 162/64  Pulse:  65 62 67  Resp:  20 20 13   Temp:  98.1 F (36.7 C)  97.5 F (36.4 C)  TempSrc:  Oral  Oral  SpO2:  98% 98% 97%  Height:        Intake/Output Summary (Last 24 hours) at 07/04/16 1147 Last data filed at 07/04/16 0935  Gross per 24 hour  Intake              240 ml  Output                0 ml  Net              240 ml   There were no vitals filed for this visit.  Examination:  General exam: Pleasant elderly female Lying comfortably supine in bed.  Respiratory system: diminished breath sounds in the bases with occasional basal crackles but otherwise clear to auscultation without wheezing or rhonchi. Respiratory effort normal. Cardiovascular system: S1 & S2 heard, RRR. No JVD, murmurs, rubs, gallops or clicks. No pedal edema. telemetry: SB 50's-SR with BBB morphology.  Gastrointestinal system: Abdomen is nondistended, soft and nontender. No organomegaly or masses felt. Normal bowel sounds heard. Central nervous system: Alert and oriented. . No focal neurological deficits. Extremities: Symmetric 5 x 5 power. Right wrist cast Skin: No rashes, lesions or ulcers Psychiatry: Judgement and insight appear slightly impaired. Mood & affect appropriate.     Data Reviewed: I have personally reviewed following labs and imaging studies  CBC:  Recent Labs Lab 07/01/16 0059 07/01/16 0122 07/01/16 0655 07/02/16 0433 07/03/16 0423  WBC 9.6  --  9.3 8.4 8.5  NEUTROABS 8.3*  --  7.7  --   --   HGB 8.6* 10.2* 8.7* 8.2* 8.3*  HCT 33.5* 30.0* 32.9* 31.2* 30.8*  MCV 84.8  --  83.7 83.9 82.4  PLT 202  --  195 176 191   Basic Metabolic Panel:  Recent Labs Lab 07/01/16 0059 07/01/16 0122 07/01/16 0655 07/01/16 1658 07/02/16 0433 07/03/16 0423  NA 139 140 139 141 142 140  K 5.5* 5.4* 5.4* 4.5 4.2 4.2  CL 105 107 105 109 109 105  CO2 24  --  24 24 22 28   GLUCOSE 114* 108* 94 98 97 100*  BUN 35* 37* 36* 35*  33* 25*  CREATININE 2.27* 2.20* 2.21* 1.62* 1.37* 0.92  CALCIUM 8.5*  --  8.7* 8.1* 8.9 9.2  MG 2.0  --   --   --   --   --    GFR: Estimated Creatinine Clearance: 48.5 mL/min (by C-G formula based on SCr of 0.92 mg/dL). Liver Function Tests:  Recent Labs Lab 07/01/16 0059 07/01/16 0655  AST 123* 121*  ALT 86* 95*  ALKPHOS 89 88  BILITOT 0.6 0.7  PROT 6.4* 6.6  ALBUMIN 3.8 3.7    Recent Labs Lab 07/01/16 0059  LIPASE 30    Recent Labs Lab 07/01/16 0655  AMMONIA 22   Coagulation Profile:  Recent Labs Lab 07/01/16 0059  INR 1.17   Cardiac Enzymes:  Recent Labs Lab 07/02/16 0441 07/02/16 1039  TROPONINI 0.09* 0.07*   BNP (last 3 results) No results for input(s): PROBNP in the last 8760 hours. HbA1C: No results for input(s): HGBA1C in the last 72 hours. CBG:  Recent Labs Lab 07/01/16 0832  GLUCAP 92   Lipid Profile: No results for input(s): CHOL, HDL, LDLCALC, TRIG, CHOLHDL, LDLDIRECT in the last 72 hours. Thyroid Function Tests: No results for input(s): TSH, T4TOTAL, FREET4, T3FREE, THYROIDAB in the last 72 hours. Anemia Panel: No results for input(s): VITAMINB12, FOLATE, FERRITIN, TIBC, IRON, RETICCTPCT in the last 72 hours.  Sepsis Labs:  Recent Labs Lab 07/01/16 0125  LATICACIDVEN 1.41    Recent Results (from the past 240 hour(s))  Urine culture     Status: None   Collection Time: 07/01/16  1:43 AM  Result Value Ref Range Status   Specimen Description URINE, CATHETERIZED  Final   Special Requests NONE  Final   Culture NO GROWTH  Final   Report Status 07/02/2016 FINAL  Final  MRSA PCR Screening     Status: Abnormal   Collection Time: 07/01/16  9:21 AM  Result Value Ref Range Status   MRSA by PCR POSITIVE (A) NEGATIVE Final    Comment:        The GeneXpert MRSA Assay (FDA approved for NASAL specimens only), is one component of a comprehensive MRSA colonization surveillance program. It is not intended to diagnose MRSA infection  nor to guide or monitor treatment for MRSA infections. RESULT CALLED TO, READ BACK BY AND VERIFIED WITH: Mariella Saa RN 11:50 07/01/16 ([email or web address])          Radiology Studies: No results found.      Scheduled Meds: . amLODipine  5 mg Oral Daily  . aspirin  81 mg Oral Daily  . Chlorhexidine Gluconate Cloth  6 each Topical Q0600  . clopidogrel  75 mg Oral Daily  . enoxaparin (LOVENOX) injection  30 mg Subcutaneous Q24H  . fluticasone  2 spray Each Nare Daily  . isosorbide mononitrate  30  mg Oral Daily  . [START ON 07/05/2016] levofloxacin  500 mg Oral Daily  . levothyroxine  75 mcg Oral QAC breakfast  . loratadine  10 mg Oral Daily  . mouth rinse  15 mL Mouth Rinse BID  . metoprolol  100 mg Oral BID  . mometasone-formoterol  2 puff Inhalation BID  . mupirocin ointment  1 application Nasal BID  . pantoprazole  40 mg Oral Daily  . sertraline  25 mg Oral Daily  . simvastatin  20 mg Oral q1800  . sodium bicarbonate  650 mg Oral TID  . tiotropium  18 mcg Inhalation Daily   Continuous Infusions:     LOS: 3 days    Dain Laseter, MD [facility] Pager [phone]  If 7PM-7AM, please contact night-coverage [email or web address] Password [facility] 07/04/2016, 11:47 AM

## 2016-07-04 NOTE — Progress Notes (Signed)
Physical Therapy Treatment Patient Details Name: ALAIRA BOSTROM MRN: [id number] DOB: [birth year] Today's Date: 07/04/2016    History of Present Illness Patient is a 80 yo female admitted 06/30/16 with acute encephalopathy, HTN.    PMH:  CKD, COPD, HTN, anemia, CHF, fall week pta - hit head and fx Rt forearm in cast    PT Comments    Confirmed with daughter that patient is to be NWB Rt wrist. Patient also with desaturation to 86% on room air with ambulation to BSC (maintained SaO2 92% on 1L), which indicates she will need home oxygen on discharge. Both of these issues further complicate her safety with mobility. Discussed with patient and daughter recommendation for SNF and both are in agreement. Dr. Waymon Amato made aware. Consult for Social Work placed.    Follow Up Recommendations  Supervision/Assistance - 24 hour;SNF     Equipment Recommendations  3in1 (PT)    Recommendations for Other Services OT consult     Precautions / Restrictions Precautions Precautions: Fall Precaution Comments: Fall pta Required Braces or Orthoses: Other Brace/Splint Other Brace/Splint: Cast Rt forearm/wrist Restrictions Weight Bearing Restrictions: Yes (None ordered) RUE Weight Bearing: Non weight bearing Other Position/Activity Restrictions: per daughter; none ordered--MD made aware    Mobility  Bed Mobility Overal bed mobility: Needs Assistance Bed Mobility: Sidelying to Sit   Sidelying to sit: Min assist       General bed mobility comments: exits bed to her  rt and unable to tolerate pushing up with right hand/wrist; assist to raise torso  Transfers Overall transfer level: Needs assistance Equipment used: 2 person hand held assist Transfers: Sit to/from Stand Sit to Stand: Min assist;+2 physical assistance;+2 safety/equipment         General transfer comment: Verbal cues for technique and minimize use of RUE. pivot to [facility] with bil forearm support; stood from [facility] with LUE on armrest and  PT holding Rt forearm (pt tried to push with Rt hand)  Ambulation/Gait Ambulation/Gait assistance: Mod assist;+2 physical assistance;+2 safety/equipment Ambulation Distance (Feet): 12 Feet Assistive device: Rolling walker (2 wheeled);2 person hand held assist Gait Pattern/deviations: Step-to pattern;Decreased stride length;Decreased weight shift to left;Shuffle   Gait velocity interpretation: Below normal speed for age/gender General Gait Details: Patient attempted ambulation using Rt forearm on RW due to low back pain; very unsteady and performed bil forearm support   Stairs            Wheelchair Mobility    Modified Rankin (Stroke Patients Only)       Balance Overall balance assessment: Needs assistance;History of Falls Sitting-balance support: No upper extremity supported;Feet supported Sitting balance-Leahy Scale: Fair     Standing balance support: Single extremity supported Standing balance-Leahy Scale: Poor Standing balance comment: unsteady in static stand with UE support                    Cognition Arousal/Alertness: Awake/alert Behavior During Therapy: WFL for tasks assessed/performed Overall Cognitive Status: No family/caregiver present to determine baseline cognitive functioning Area of Impairment: Memory;Following commands;Safety/judgement;Awareness;Problem solving     Memory: Decreased recall of precautions Following Commands: Follows one step commands with increased time Safety/Judgement: Decreased awareness of safety;Decreased awareness of deficits Awareness: Intellectual Problem Solving: Slow processing;Difficulty sequencing;Requires verbal cues;Requires tactile cues General Comments: pt unaware of Rt wrist restrictions, however daughter reports NWB per MD. Pt unable to problem solve how to move herself in bed, transfers, or walking without overuse of Rt wrist.     Exercises  General Comments General comments (skin integrity, edema,  etc.): With pt permission called daughter, Reece Levy, and she reported pt is NWB Rt wrist. Reports she agrees with short-term SNF for therapy as she cannot currently safely manage pt and she is sure she would fall again at home.      Pertinent Vitals/Pain Pain Assessment: 0-10 Pain Score: 8  Pain Location: low back Pain Descriptors / Indicators: Aching Pain Intervention(s): Limited activity within patient's tolerance;Monitored during session;Repositioned    Home Living                      Prior Function            PT Goals (current goals can now be found in the care plan section) Acute Rehab PT Goals Patient Stated Goal: To go home soon Time For Goal Achievement: 07/10/16 Progress towards PT goals: Not progressing toward goals - comment (pt confirmed NWB Rt wrist--incr difficulty with mobility)    Frequency    Min 3X/week      PT Plan Discharge plan needs to be updated    Co-evaluation             End of Session Equipment Utilized During Treatment: Gait belt;Oxygen Activity Tolerance: Patient limited by fatigue;Patient limited by pain Patient left: in chair;with call bell/phone within reach;with chair alarm set;with nursing/sitter in room     Time: 0272-5366 PT Time Calculation (min) (ACUTE ONLY): 32 min  Charges:  $Gait Training: 8-22 mins $Therapeutic Activity: 8-22 mins                    G Codes:      Tinsley Lomas Jul 29, 2016, 10:42 AM Pager [phone]

## 2016-07-04 NOTE — Progress Notes (Signed)
SATURATION QUALIFICATIONS: (This note is used to comply with regulatory documentation for home oxygen)  Patient Saturations on Room Air at Rest = 93%  Patient Saturations on Room Air while Ambulating = 86%  Patient Saturations on 1 Liters of oxygen while Ambulating = 92%  Please briefly explain why patient needs home oxygen:  To maintain SaO2>87% during mobility   07/04/2016 Barry Brunner, PT Pager: [phone]

## 2016-07-04 NOTE — Clinical Social Work Note (Signed)
Clinical Social Work Assessment  Patient Details  Name: Carolyn Hendricks MRN: [id number] Date of Birth: 1936-03-12  Date of referral:  07/04/16               Reason for consult:  Facility Placement                Permission sought to share information with:  Facility Sport and exercise psychologist, Family Supports Permission granted to share information::  Yes, Verbal Permission Granted  Name::     Advertising copywriter::  SNFs  Relationship::  Daughter  Contact Information:  [phone]  Housing/Transportation Living arrangements for the past 2 months:  [place] of Information:  Patient Patient Interpreter Needed:  None Criminal Activity/Legal Involvement Pertinent to Current Situation/Hospitalization:  No - Comment as needed Significant Relationships:  Adult Children Lives with:  Adult Children Do you feel safe going back to the place where you live?  No Need for family participation in patient care:  Yes (Comment)  Care giving concerns:  CSW received consult for possible SNF placement at time of discharge. CSW met with patient regarding PT recommendation of SNF placement at time of discharge. Patient reports living with her daughter and patient's daughter is currently unable to care for patient at their home given patient's current physical needs and fall risk. Patient and patient's daughter expressed understanding of PT recommendation and are agreeable to SNF placement at time of discharge. CSW to continue to follow and assist with discharge planning needs.   Social Worker assessment / plan:  CSW spoke with patient concerning possibility of rehab at [facility] before returning home.  Employment status:  Retired Forensic scientist:  Medicare PT Recommendations:  [place] / Referral to community resources:  [place]  Patient/Family's Response to care:  Patient recognizes need for rehab before returning home and is agreeable to a SNF in  [place]. Patient reported preference for [place].  Patient/Family's Understanding of and Emotional Response to Diagnosis, Current Treatment, and Prognosis:  Patient/family is realistic regarding therapy needs and expressed being hopeful for SNF placement. Patient expressed understanding of CSW role and discharge process. No questions/concerns about plan or treatment.    Emotional Assessment Appearance:  Appears stated age Attitude/Demeanor/Rapport:  Other (Appropriate) Affect (typically observed):  Accepting, Adaptable, Appropriate, Pleasant Orientation:  Oriented to Self, Oriented to Situation, Oriented to Place, Oriented to  Time Alcohol / Substance use:  Not Applicable Psych involvement (Current and /or in the community):  No (Comment)  Discharge Needs  Concerns to be addressed:  Care Coordination Readmission within the last 30 days:  No Current discharge risk:  None Barriers to Discharge:  Continued Medical Work up   [employer], [place] 07/04/2016, 5:30 PM

## 2016-07-04 NOTE — NC FL2 (Signed)
[place] LEVEL OF CARE SCREENING TOOL     IDENTIFICATION  Patient Name: Carolyn Hendricks Birthdate: [birth year] Sex: female Admission Date (Current Location): 06/30/2016  [facility] and [place] Number:  Herbalist and Address:  [facility] [place] [address] [place], [place] [address]      Provider Number: [id number]  Attending Physician Name and Address:  Modena Jansky, MD  Relative Name and Phone Number:       Current Level of Care: Hospital Recommended Level of Care: [place] Prior Approval Number:    Date Approved/Denied:   PASRR Number: [id number]  Discharge Plan: SNF    Current Diagnoses: Patient Active Problem List   Diagnosis Date Noted  . Hypoxia 07/01/2016  . Acute encephalopathy 07/01/2016  . Renal failure (ARF), acute on chronic (HCC) 07/01/2016  . Acute respiratory failure with hypoxia ([place]) 07/01/2016  . S/P laparoscopic cholecystectomy 12/01/2015  . Umbilical hernia without obstruction and without gangrene   . COPD (chronic obstructive pulmonary disease) ([place]) 10/22/2015  . Peripheral vascular disease ([place]) 10/15/2015  . Difficulty in walking 10/15/2015  . Airway hyperreactivity 10/15/2015  . Gallstone pancreatitis   . MRSA cellulitis 06/29/2015  . SOB (shortness of breath)   . LBBB (left bundle branch block)   . Angina pectoris ([place]) 05/14/2015  . Leg swelling 05/14/2015  . Allergic rhinitis 04/17/2015  . Anxiety 04/17/2015  . Arthralgia of hand 04/17/2015  . Chronic kidney disease (CKD), stage III (moderate) 04/17/2015  . Fatigue 04/17/2015  . Numbness and tingling 04/17/2015  . Nonspecific abnormal finding in stool contents 04/17/2015  . Restless leg 04/17/2015  . Hypothyroidism 07/09/2014  . Abnormal mammogram 11/01/2012  . Carotid stenosis 03/28/2012  . Atrial tachycardia ([place]) 03/28/2012  . Hyperlipidemia 03/28/2012  . Carotid artery obstruction 03/28/2012  .  Supraventricular tachycardia ([place]) 03/28/2012  . Hyperpotassemia 02/27/2010  . Heart murmur 03/10/2009  . Insomnia 03/05/2009  . Essential (primary) hypertension 01/29/2009  . Goiter 01/29/2009  . History of tobacco use 01/29/2009  . Spinal stenosis of lumbar region without neurogenic claudication 07/14/2003    Orientation RESPIRATION BLADDER Height & Weight     Self, Time, Situation, Place  O2 (Nasal cannula 1L) Continent Weight:   Height:  5' (152.4 cm)  BEHAVIORAL SYMPTOMS/MOOD NEUROLOGICAL BOWEL NUTRITION STATUS      Continent Diet (Please see DC Summary)  AMBULATORY STATUS COMMUNICATION OF NEEDS Skin   Limited Assist Verbally Other (Comment) (Wound on breast)                       Personal Care Assistance Level of Assistance  Bathing, Feeding, Dressing Bathing Assistance: Maximum assistance Feeding assistance: Independent Dressing Assistance: Limited assistance     Functional Limitations Info             SPECIAL CARE FACTORS FREQUENCY  PT (By licensed PT)     PT Frequency: 5x/week              Contractures      Additional Factors Info  Code Status, Allergies, Isolation Precautions Code Status Info: Full Allergies Info: Atorvastatin, Crestor Rosuvastatin Calcium, Tape, Pravastatin Sodium     Isolation Precautions Info: MRSA     Current Medications (07/04/2016):  This is the current hospital active medication list Current Facility-Administered Medications  Medication Dose Route Frequency Provider Last Rate Last Dose  . acetaminophen (TYLENOL) tablet 650 mg  650 mg Oral Q6H PRN Lenis Dickinson  Hong[place], MD      . albuterol (PROVENTIL) (2.5 MG/3ML) 0.083% nebulizer solution 2.5 mg  2.5 mg Nebulization Q2H PRN Modena Jansky, MD      . alum & mag hydroxide-simeth (MAALOX/MYLANTA) 200-200-20 MG/5ML suspension 30 mL  30 mL Oral Q6H PRN Modena Jansky, MD      . Derrill Memo ON 07/05/2016] amLODipine (NORVASC) tablet 10 mg  10 mg Oral Daily Modena Jansky, MD       . amLODipine (NORVASC) tablet 5 mg  5 mg Oral Once Modena Jansky, MD      . aspirin chewable tablet 81 mg  81 mg Oral Daily Rise Patience, MD   81 mg at 07/04/16 1003  . Chlorhexidine Gluconate Cloth 2 % PADS 6 each  6 each Topical Q0600 Modena Jansky, MD   6 each at 07/03/16 [phone]  . clopidogrel (PLAVIX) tablet 75 mg  75 mg Oral Daily Rise Patience, MD   75 mg at 07/04/16 1000  . enoxaparin (LOVENOX) injection 30 mg  30 mg Subcutaneous Q24H Rise Patience, MD   30 mg at 07/04/16 0959  . fluticasone (FLONASE) 50 MCG/ACT nasal spray 2 spray  2 spray Each Nare Daily Rise Patience, MD   2 spray at 07/04/16 1002  . hydrALAZINE (APRESOLINE) injection 10 mg  10 mg Intravenous Q4H PRN Rise Patience, MD   10 mg at 07/04/16 0258  . HYDROcodone-acetaminophen (NORCO) 10-325 MG per tablet 0.5 tablet  0.5 tablet Oral Q8H PRN Modena Jansky, MD   0.5 tablet at 07/04/16 0309  . isosorbide mononitrate (IMDUR) 24 hr tablet 30 mg  30 mg Oral Daily Rise Patience, MD   30 mg at 07/04/16 0959  . [START ON 07/05/2016] levofloxacin (LEVAQUIN) tablet 500 mg  500 mg Oral Daily Jake Church Masters, RPH      . levothyroxine (SYNTHROID, LEVOTHROID) tablet 75 mcg  75 mcg Oral QAC breakfast Modena Jansky, MD   75 mcg at 07/04/16 1002  . loratadine (CLARITIN) tablet 10 mg  10 mg Oral Daily Rise Patience, MD   10 mg at 07/04/16 1000  . LORazepam (ATIVAN) tablet 0.25 mg  0.25 mg Oral Q12H PRN Modena Jansky, MD      . MEDLINE mouth rinse  15 mL Mouth Rinse BID Rise Patience, MD   15 mL at 07/04/16 1017  . metoprolol tartrate (LOPRESSOR) tablet 100 mg  100 mg Oral BID Modena Jansky, MD   100 mg at 07/03/16 2230  . mometasone-formoterol (DULERA) 200-5 MCG/ACT inhaler 2 puff  2 puff Inhalation BID Rise Patience, MD   2 puff at 07/02/16 0831  . mupirocin ointment (BACTROBAN) 2 % 1 application  1 application Nasal BID Modena Jansky, MD   1 application at 11/20/13 1012   . nitroGLYCERIN (NITROSTAT) SL tablet 0.4 mg  0.4 mg Sublingual Q5 min PRN Rise Patience, MD   0.4 mg at 07/02/16 0431  . pantoprazole (PROTONIX) EC tablet 40 mg  40 mg Oral Daily Rise Patience, MD   40 mg at 07/04/16 0959  . sertraline (ZOLOFT) tablet 25 mg  25 mg Oral Daily Modena Jansky, MD   25 mg at 07/04/16 1000  . simvastatin (ZOCOR) tablet 20 mg  20 mg Oral q1800 Rise Patience, MD   20 mg at 07/03/16 1712  . sodium bicarbonate tablet 650 mg  650 mg Oral TID Rise Patience,  MD   650 mg at 07/04/16 0959  . tiotropium (SPIRIVA) inhalation capsule 18 mcg  18 mcg Inhalation Daily Rise Patience, MD   18 mcg at 07/02/16 0831  . traZODone (DESYREL) tablet 50 mg  50 mg Oral QHS PRN Modena Jansky, MD   50 mg at 07/02/16 2213     Discharge Medications: Please see discharge summary for a list of discharge medications.  Relevant Imaging Results:  Relevant Lab Results:   Additional Information SSN: [place]  [place], [place]

## 2016-07-05 DIAGNOSIS — R51 Headache: Secondary | ICD-10-CM | POA: Diagnosis not present

## 2016-07-05 DIAGNOSIS — M6281 Muscle weakness (generalized): Secondary | ICD-10-CM | POA: Diagnosis not present

## 2016-07-05 DIAGNOSIS — L03113 Cellulitis of right upper limb: Secondary | ICD-10-CM | POA: Diagnosis not present

## 2016-07-05 DIAGNOSIS — S62109A Fracture of unspecified carpal bone, unspecified wrist, initial encounter for closed fracture: Secondary | ICD-10-CM | POA: Diagnosis not present

## 2016-07-05 DIAGNOSIS — S52521A Torus fracture of lower end of right radius, initial encounter for closed fracture: Secondary | ICD-10-CM | POA: Diagnosis not present

## 2016-07-05 DIAGNOSIS — R259 Unspecified abnormal involuntary movements: Secondary | ICD-10-CM | POA: Diagnosis not present

## 2016-07-05 DIAGNOSIS — W19XXXD Unspecified fall, subsequent encounter: Secondary | ICD-10-CM | POA: Diagnosis not present

## 2016-07-05 DIAGNOSIS — J9621 Acute and chronic respiratory failure with hypoxia: Secondary | ICD-10-CM | POA: Diagnosis not present

## 2016-07-05 DIAGNOSIS — S63391A Traumatic rupture of other ligament of right wrist, initial encounter: Secondary | ICD-10-CM | POA: Diagnosis not present

## 2016-07-05 DIAGNOSIS — G9340 Encephalopathy, unspecified: Secondary | ICD-10-CM | POA: Diagnosis not present

## 2016-07-05 DIAGNOSIS — R2681 Unsteadiness on feet: Secondary | ICD-10-CM | POA: Diagnosis not present

## 2016-07-05 DIAGNOSIS — N179 Acute kidney failure, unspecified: Secondary | ICD-10-CM | POA: Diagnosis not present

## 2016-07-05 DIAGNOSIS — R0609 Other forms of dyspnea: Secondary | ICD-10-CM | POA: Diagnosis not present

## 2016-07-05 DIAGNOSIS — J9600 Acute respiratory failure, unspecified whether with hypoxia or hypercapnia: Secondary | ICD-10-CM | POA: Diagnosis not present

## 2016-07-05 DIAGNOSIS — J9622 Acute and chronic respiratory failure with hypercapnia: Secondary | ICD-10-CM

## 2016-07-05 DIAGNOSIS — R278 Other lack of coordination: Secondary | ICD-10-CM | POA: Diagnosis not present

## 2016-07-05 DIAGNOSIS — M545 Low back pain: Secondary | ICD-10-CM | POA: Diagnosis not present

## 2016-07-05 DIAGNOSIS — E038 Other specified hypothyroidism: Secondary | ICD-10-CM | POA: Diagnosis not present

## 2016-07-05 DIAGNOSIS — R41841 Cognitive communication deficit: Secondary | ICD-10-CM | POA: Diagnosis not present

## 2016-07-05 DIAGNOSIS — I10 Essential (primary) hypertension: Secondary | ICD-10-CM | POA: Diagnosis not present

## 2016-07-05 DIAGNOSIS — M79601 Pain in right arm: Secondary | ICD-10-CM | POA: Diagnosis not present

## 2016-07-05 DIAGNOSIS — R609 Edema, unspecified: Secondary | ICD-10-CM | POA: Diagnosis not present

## 2016-07-05 DIAGNOSIS — R2689 Other abnormalities of gait and mobility: Secondary | ICD-10-CM | POA: Diagnosis not present

## 2016-07-05 DIAGNOSIS — R296 Repeated falls: Secondary | ICD-10-CM | POA: Diagnosis not present

## 2016-07-05 LAB — LEGIONELLA PNEUMOPHILA TOTAL AB

## 2016-07-05 MED ORDER — HYDROCODONE-ACETAMINOPHEN 10-325 MG PO TABS: 0.5000 | ORAL_TABLET | Freq: Three times a day (TID) | ORAL | 0 refills | Status: DC | PRN

## 2016-07-05 MED ORDER — ALBUTEROL SULFATE (2.5 MG/3ML) 0.083% IN NEBU: 2.5000 mg | INHALATION_SOLUTION | RESPIRATORY_TRACT | Status: AC | PRN

## 2016-07-05 MED ORDER — AMLODIPINE BESYLATE 10 MG PO TABS: 10.0000 mg | ORAL_TABLET | Freq: Every day | ORAL | Status: DC

## 2016-07-05 MED ORDER — SERTRALINE HCL 50 MG PO TABS: ORAL_TABLET | ORAL | 0 refills | Status: AC

## 2016-07-05 MED ORDER — TRAZODONE HCL 100 MG PO TABS: 50.0000 mg | ORAL_TABLET | Freq: Every evening | ORAL | Status: DC | PRN

## 2016-07-05 MED ORDER — LORAZEPAM 0.5 MG PO TABS
0.2500 mg | ORAL_TABLET | Freq: Two times a day (BID) | ORAL | 0 refills | Status: AC | PRN
Start: 2016-07-05 — End: ?

## 2016-07-05 NOTE — Progress Notes (Signed)
Report given to [place].

## 2016-07-05 NOTE — Progress Notes (Signed)
Patient will DC to: [facility] Anticipated DC date: 07/05/16 Family notified: Daughter, Psychologist, counselling by: Corey Harold   Per MD patient ready for DC to [facility]. RN, patient, patient's family, and facility notified of DC. Discharge Summary sent to facility. RN given number for report. DC packet on chart. Ambulance transport requested for patient.   CSW signing off.  Cedric [place], [place] Social Worker [phone]

## 2016-07-05 NOTE — Progress Notes (Signed)
Patient daughter request to speak with CSW. CSW made aware and states that she will come to room and speak with daughter regarding pending discharge to SNF today.

## 2016-07-05 NOTE — Discharge Instructions (Signed)
Community-Acquired Pneumonia, Adult Pneumonia is an infection of the lungs. There are different types of pneumonia. One type can develop while a person is in a hospital. A different type, called community-acquired pneumonia, develops in people who are not, or have not recently been, in the hospital or other health care facility.  CAUSES Pneumonia may be caused by bacteria, viruses, or funguses. Community-acquired pneumonia is often caused by Streptococcus pneumonia bacteria. These bacteria are often passed from one person to another by breathing in droplets from the cough or sneeze of an infected person. RISK FACTORS The condition is more likely to develop in:  People who havechronic diseases, such as chronic obstructive pulmonary disease (COPD), asthma, congestive heart failure, cystic fibrosis, diabetes, or kidney disease.  People who haveearly-stage or late-stage HIV.  People who havesickle cell disease.  People who havehad their spleen removed (splenectomy).  People who havepoor Human resources officer.  People who havemedical conditions that increase the risk of breathing in (aspirating) secretions their own mouth and nose.   People who havea weakened immune system (immunocompromised).  People who smoke.  People whotravel to areas where pneumonia-causing germs commonly exist.  People whoare around animal habitats or animals that have pneumonia-causing germs, including birds, bats, rabbits, cats, and farm animals. SYMPTOMS Symptoms of this condition include:  Adry cough.  A wet (productive) cough.  Fever.  Sweating.  Chest pain, especially when breathing deeply or coughing.  Rapid breathing or difficulty breathing.  Shortness of breath.  Shaking chills.  Fatigue.  Muscle aches. DIAGNOSIS Your health care provider will take a medical history and perform a physical exam. You may also have other tests, including:  Imaging studies of your chest, including  X-rays.  Tests to check your blood oxygen level and other blood gases.  Other tests on blood, mucus (sputum), fluid around your lungs (pleural fluid), and urine. If your pneumonia is severe, other tests may be done to identify the specific cause of your illness. TREATMENT The type of treatment that you receive depends on many factors, such as the cause of your pneumonia, the medicines you take, and other medical conditions that you have. For most adults, treatment and recovery from pneumonia may occur at home. In some cases, treatment must happen in a hospital. Treatment may include:  Antibiotic medicines, if the pneumonia was caused by bacteria.  Antiviral medicines, if the pneumonia was caused by a virus.  Medicines that are given by mouth or through an IV tube.  Oxygen.  Respiratory therapy. Although rare, treating severe pneumonia may include:  Mechanical ventilation. This is done if you are not breathing well on your own and you cannot maintain a safe blood oxygen level.  Thoracentesis. This procedureremoves fluid around one lung or both lungs to help you breathe better. HOME CARE INSTRUCTIONS  Take over-the-counter and prescription medicines only as told by your health care provider.  Only takecough medicine if you are losing sleep. Understand that cough medicine can prevent your body's natural ability to remove mucus from your lungs.  If you were prescribed an antibiotic medicine, take it as told by your health care provider. Do not stop taking the antibiotic even if you start to feel better.  Sleep in a semi-upright position at night. Try sleeping in a reclining chair, or place a few pillows under your head.  Do not use tobacco products, including cigarettes, chewing tobacco, and e-cigarettes. If you need help quitting, ask your health care provider.  Drink enough water to keep your urine  clear or pale yellow. This will help to thin out mucus secretions in your  lungs. PREVENTION There are ways that you can decrease your risk of developing community-acquired pneumonia. Consider getting a pneumococcal vaccine if:  You are older than 78 years of age.  You are older than 78 years of age and are undergoing cancer treatment, have chronic lung disease, or have other medical conditions that affect your immune system. Ask your health care provider if this applies to you. There are different types and schedules of pneumococcal vaccines. Ask your health care provider which vaccination option is best for you. You may also prevent community-acquired pneumonia if you take these actions:  Get an influenza vaccine every year. Ask your health care provider which type of influenza vaccine is best for you.  Go to the dentist on a regular basis.  Wash your hands often. Use hand sanitizer if soap and water are not available. SEEK MEDICAL CARE IF:  You have a fever.  You are losing sleep because you cannot control your cough with cough medicine. SEEK IMMEDIATE MEDICAL CARE IF:  You have worsening shortness of breath.  You have increased chest pain.  Your sickness becomes worse, especially if you are an older adult or have a weakened immune system.  You cough up blood.   This information is not intended to replace advice given to you by your health care provider. Make sure you discuss any questions you have with your health care provider.   Document Released: 09/26/2005 Document Revised: 06/17/2015 Document Reviewed: 01/21/2015 Elsevier Interactive Patient Education 2016 [place].   Acute respiratory failure with hypoxia and hypercapnea  Respiratory failure is when your lungs are not working well and your breathing (respiratory) system fails. When respiratory failure occurs, it is difficult for your lungs to get enough oxygen, get rid of carbon dioxide, or both. Respiratory failure can be life threatening.  Respiratory failure can be acute or chronic.  Acute respiratory failure is sudden, severe, and requires emergency medical treatment. Chronic respiratory failure is less severe, happens over time, and requires ongoing treatment.  WHAT ARE THE CAUSES OF ACUTE RESPIRATORY FAILURE?  Any problem affecting the heart or lungs can cause acute respiratory failure. Some of these causes include the following:  Chronic bronchitis and emphysema (COPD).   Blood clot going to a lung (pulmonary embolism).   Having water in the lungs caused by heart failure, lung injury, or infection (pulmonary edema).   Collapsed lung (pneumothorax).   Pneumonia.   Pulmonary fibrosis.   Obesity.   Asthma.   Heart failure.   Any type of trauma to the chest that can make breathing difficult.   Nerve or muscle diseases making chest movements difficult. HOW WILL MY ACUTE RESPIRATORY FAILURE BE TREATED?  Treatment of acute respiratory failure depends on the cause of the respiratory failure. Usually, you will stay in the intensive care unit so your breathing can be watched closely. Treatment can include the following:  Oxygen. Oxygen can be delivered through the following:  Nasal cannula. This is small tubing that goes in your nose to give you oxygen.  Face mask. A face mask covers your nose and mouth to give you oxygen.  Medicine. Different medicines can be given to help with breathing. These can include:  Nebulizers. Nebulizers deliver medicines to open the air passages (bronchodilators). These medicines help to open or relax the airways in the lungs so you can breathe better. They can also help loosen mucus from your  lungs.  Diuretics. Diuretic medicines can help you breathe better by getting rid of extra water in your body.  Steroids. Steroid medicines can help decrease swelling (inflammation) in your lungs.  Antibiotics.  Chest tube. If you have a collapsed lung (pneumothorax), a chest tube is placed to help reinflate the  lung.  Noninvasive positive pressure ventilation (NPPV). This is a tight-fitting mask that goes over your nose and mouth. The mask has tubing that is attached to a machine. The machine blows air into the tubing, which helps to keep the tiny air sacs (alveoli) in your lungs open. This machine allows you to breathe on your own.  Ventilator. A ventilator is a breathing machine. When on a ventilator, a breathing tube is put into the lungs. A ventilator is used when you can no longer breathe well enough on your own. You may have low oxygen levels or high carbon dioxide (CO2) levels in your blood. When you are on a ventilator, sedation and pain medicines are given to make you sleep so your lungs can heal. SEEK IMMEDIATE MEDICAL CARE IF:  You have shortness of breath (dyspnea) with or without activity.  You have rapid breathing (tachypnea).  You are wheezing.  You are unable to say more than a few words without having to catch your breath.  You find it very difficult to function normally.  You have a fast heart rate.  You have a bluish color to your finger or toe nail beds.  You have confusion or drowsiness or both.   This information is not intended to replace advice given to you by your health care provider. Make sure you discuss any questions you have with your health care provider.   Document Released: 10/01/2013 Document Revised: 06/17/2015 Document Reviewed: 10/01/2013 Elsevier Interactive Patient Education [employer].

## 2016-07-05 NOTE — Clinical Social Work Placement (Signed)
   CLINICAL SOCIAL WORK PLACEMENT  NOTE  Date:  07/05/2016  Patient Details  Name: Carolyn Hendricks MRN: [id number] Date of Birth: 24-Aug-1936  Clinical Social Work is seeking post-discharge placement for this patient at the [place] level of care (*CSW will initial, date and re-position this form in  chart as items are completed):  Yes   Patient/family provided with [facility] Work Department's list of facilities offering this level of care within the geographic area requested by the patient (or if unable, by the patient's family).  Yes   Patient/family informed of their freedom to choose among providers that offer the needed level of care, that participate in Medicare, Medicaid or managed care program needed by the patient, have an available bed and are willing to accept the patient.  Yes   Patient/family informed of [facility]'s ownership interest in [facility] and [facility], as well as of the fact that they are under no obligation to receive care at these facilities.  PASRR submitted to EDS on 07/04/16     PASRR number received on 07/04/16     Existing PASRR number confirmed on       FL2 transmitted to all facilities in geographic area requested by pt/family on 07/04/16     FL2 transmitted to all facilities within larger geographic area on       Patient informed that his/her managed care company has contracts with or will negotiate with certain facilities, including the following:        Yes   Patient/family informed of bed offers received.  Patient chooses bed at [facility], [place]     Physician recommends and patient chooses bed at      Patient to be transferred to [facility] on 07/05/16.  Patient to be transferred to facility by [facility]     Patient family notified on 07/05/16 of transfer.  Name of family member notified:  Hilda Blades, daughter     PHYSICIAN Please sign FL2, Please prepare prescriptions      Additional Comment:    _______________________________________________ Benard Halsted, [place] 07/05/2016, 12:53 PM

## 2016-07-05 NOTE — Discharge Summary (Addendum)
Physician Discharge Summary  OREOLUWA GILMER WJX:[id number] DOB: 06-10-1936  PCP: [name], MD  Admit date: 06/30/2016 Discharge date: 07/05/2016  Admitted From: Home Disposition:  SNF  Recommendations for Outpatient Follow-up:  1. MD at SNF In 3 days with repeat labs (CBC & CMP). 2. Dr. [name], PCP upon discharge from SNF. 3. Orthopedics: Patient may follow-up with the same M.D. that had seen him prior to this admission. 4. Dr. Kathlyn Sacramento, Cardiology: Patient has a follow-up appointment on 07/15/16 at 11 AM. 5. Recommend repeating chest x-ray in 4-6 weeks to ensure resolution of pneumonia findings. 6. Reassess oxygen requirement at SNF. Currently saturating in the low 90s on room air with minimal activity.  Home Health: None Equipment/Devices: None    Discharge Condition: Improved and stable.  CODE STATUS: Full  Diet recommendation: Heart healthy diet.    Discharge Diagnoses:  Principal Problem:   Acute encephalopathy Active Problems:   Essential (primary) hypertension   Hypothyroidism   Renal failure (ARF), acute on chronic (HCC)   Acute respiratory failure with hypoxia (HCC)   Brief/Interim Summary: 80 year old female, daughter lives with her, PMH of stage III-4 chronic kidney disease (follows with nephrology), chronic bronchitis/? COPD not on home oxygen or CPAP, HTN, chronic anemia, HLD, chronic diastolic dysfunction (follows with Dr. Candis Musa), former smoker, sustained a fall mid last week, hit her head, evaluated at [facility] and CT head and back said to be unremarkable, fracture of right forearm, seen by outpatient orthopedics on 06/29/16 at which time cast was changed, presented to [facility] ED on 06/30/16 with new onset of confusion since morning of admission, hallucinations. No change in pain medications which are dispensed by daughter. Denies dysuria, urinary frequency or any other symptoms suggestive of infection. In the ED, CT head unremarkable, chest x-ray suggesting  possible infiltrates, patient drowsy and hypoxic, placed on BiPAP and admitted to SDU. Confusion better but still having intermittent confusion. Overnight 9/22, developed chest pain, minimal elevation of troponins, chronic LBBB on EKG. Cardiology consulted 9/23. Transfer to telemetry 9/23.   Assessment & Plan:   Acute encephalopathy  - Probably related to worsening renal insufficiency and opioids.  - CT head 07/01/16: No acute abnormality. Apparently had a negative head CT couple days ago at [facility].  - MRI brain 07/01/16: Negative brain MRI.  - Urine microscopy: Negative for UTI features.  - Chest x-ray 06/30/16: Right midlung and left basilar airspace opacities likely reflect atelectasis although infection cannot be ruled out. Patient lacks respiratory symptoms such as cough or fever. Repeat chest x-ray 9/21: Interval mild right basilar pneumonia or patchy atelectasis. Resolved left basilar atelectasis. - Initial ABG had shown hypercapnia/63.4 and acidosis 7.214. This resolved after brief BiPAP (pH 7.388, PCO2 39.4).  - TSH normal.  - Ammonia: Normal. Lactate normal. - Temporarily held psychotropic medications (Ativan, Narco, trazodone and Zoloft). Due to ongoing chronic pain issues, anxiety, resumed these medications at reduced doses and monitored. Avoiding NSAIDs secondary to renal insufficiency. - As per discussion with patient's daughter-in-law on 9/24, patient apparently gets slightly confused when she takes her pain medications at home on an average up to 3 times per day.  - Mental status changes resolved. She may have an element of underlying cognitive impairment I.e dementia.  Acute hypoxic and hypercapnic respiratory failure - Possibly related to opioid pain medications complicating worsening renal insufficiency and possible Community-acquired pneumonia/chronic bronchitis/COPD - Briefly on BiPAP. ABG improved. CO2 has normalized. Oxygen titrated to maintain sats 88-92 percent. - On 9/25,  patient was hypoxic at 86% on room air with minimal activity. On reevaluation by patients are in, oxygen saturations in the low 90s with minimal activity i.e. getting to the bedside commode and chair. This can be reassessed at SNF.  Possible Community-acquired pneumonia - Completed 5 days of levofloxacin. Denies dyspnea, cough or chest pain. Completed treatment. Follow-up chest x-ray in 4-6 weeks. Incentive spirometry and flutter valve.  Acute on stage III-4 chronic kidney disease - She was briefly hydrate with IV fluids, ARB*-HCTZ was held and creatinine has normalized. Follow-up BMP at SNF in a couple of days. At that time determination can be made whether to resume ARB. - Creatinine was 1.28 in February 2017. Admitted with creatinine of 2.27.   Hyperkalemia - Treated for acute kidney injury. Provided a dose of Kayexalate and hyperkalemia resolved. Continue oral bicarbonate.  Chest pain/chronic LBBB/elevated troponin - Overnight 07/01/16, developed chest pain, partial relief with NTG and subsequent relief with IV morphine. EKG sinus rhythm, LBBB (old). Troponin 1:0.09 - Patient sees Dr. Tanda Rockers, Cardiology. Cardiology consultation 07/02/16 appreciated: Recent 9/16 with mild nonobstructive CAD. Chest pain felt to be atypical related to indigestion from Kayexalate. Doubt chest pain was cardiac. Likely GI. No further cardiac workup recommended. - Has not complained of chest pain since then.  Essential hypertension - Continue Imdur and metoprolol. ARB-HCTZ currently on hold due to recent acute kidney injury. Uncontrolled. Added amlodipine 5 MG daily on 9/24 >increased to 10 MG daily on 9/25.Better controlled. Monitor closely as outpatient.  Hypothyroid - Continue Synthroid. TSH normal.  Chronic bronchitis/possible COPD/Possible CAP - No clinical bronchospasm - Completed levofloxacin. Incentive spirometry.  Repeat chest x-ray results as indicated above  Chronic anemia - Hb has  gradually dropped to 8.6 > 8.7 > 8.2. Possibly dilutional. Hemoglobin stable in the low 8 g per DL range. Follow CBC in a.m. and consider transfusion for hemoglobin <7 g per DL.Outpatient evaluation as deemed necessary.  Recent right wrist fracture  - Has a cast. Outpatient follow-up with orthopedics. Non weight bearing per PT  Chronic pain - Resumed home opioids at reduced dose.  - Reported abdominal pain without acute findings on exam. KUB unremarkable. Urine culture negative. Lactate normal. - As per discussion with family, patient has generalized body aches but mostly in the low back.  - Has not complained of pain issues for the last couple of days.  Mildly elevated transaminases - Unclear etiology. Follow LFTs as outpatient. No GI symptoms.    Consultants:   Cardiology  Procedures:   BIPAP   Discharge Instructions  Discharge Instructions    (HEART FAILURE PATIENTS) Call MD:  Anytime you have any of the following symptoms: 1) 3 pound weight gain in 24 hours or 5 pounds in 1 week 2) shortness of breath, with or without a dry hacking cough 3) swelling in the hands, feet or stomach 4) if you have to sleep on extra pillows at night in order to breathe.    Complete by:  As directed    Call MD for:    Complete by:  As directed    Recurrent confusion.   Call MD for:  difficulty breathing, headache or visual disturbances    Complete by:  As directed    Call MD for:  extreme fatigue    Complete by:  As directed    Call MD for:  persistant dizziness or light-headedness    Complete by:  As directed    Call MD for:  severe uncontrolled pain  Complete by:  As directed    Call MD for:  temperature >100.4    Complete by:  As directed    Diet - low sodium heart healthy    Complete by:  As directed    Increase activity slowly    Complete by:  As directed        Medication List    STOP taking these medications   Aclidinium Bromide 400 MCG/ACT Aepb Commonly known as:   TUDORZA PRESSAIR   valsartan-hydrochlorothiazide 320-12.5 MG tablet Commonly known as:  DIOVAN-HCT     TAKE these medications   albuterol (2.5 MG/3ML) 0.083% nebulizer solution Commonly known as:  PROVENTIL Take 3 mLs (2.5 mg total) by nebulization every 4 (four) hours as needed for wheezing or shortness of breath.   amLODipine 10 MG tablet Commonly known as:  NORVASC Take 1 tablet (10 mg total) by mouth daily. Start taking on:  07/06/2016   aspirin 81 MG tablet Take 81 mg by mouth every morning. Reported on 01/07/2016   cetirizine 10 MG tablet Commonly known as:  ZYRTEC Take 10 mg by mouth at bedtime.   clopidogrel 75 MG tablet Commonly known as:  PLAVIX Take 1 tablet (75 mg total) by mouth every morning.   fluticasone 50 MCG/ACT nasal spray Commonly known as:  FLONASE INHALE ONE TO TWO SPRAY IN EACH NOSTRIL DAILY AS NEEDED What changed:  See the new instructions.   Fluticasone-Salmeterol 250-50 MCG/DOSE Aepb Commonly known as:  ADVAIR DISKUS Inhale 1 puff into the lungs every 12 (twelve) hours.   furosemide 20 MG tablet Commonly known as:  LASIX Take 20 mg by mouth daily as needed for fluid. Pt reports she does not take lasix everyday, my bladder is so small, it makes me go to the bathroom a lot.   HYDROcodone-acetaminophen 10-325 MG tablet Commonly known as:  NORCO Take 0.5 tablets by mouth every 8 (eight) hours as needed for moderate pain or severe pain. What changed:  how much to take  when to take this  reasons to take this   isosorbide mononitrate 30 MG 24 hr tablet Commonly known as:  IMDUR TAKE 1 TABLET BY MOUTH DAILY FOR HEART What changed:  See the new instructions.   levothyroxine 75 MCG tablet Commonly known as:  SYNTHROID, LEVOTHROID TAKE 1 TABLET BY MOUTH DAILY (FOR THYROID) What changed:  See the new instructions.   LORazepam 0.5 MG tablet Commonly known as:  ATIVAN Take 0.5 tablets (0.25 mg total) by mouth every 12 (twelve) hours as needed  for anxiety. What changed:  how much to take  when to take this  additional instructions   metoprolol 100 MG tablet Commonly known as:  LOPRESSOR Take 1 tablet (100 mg total) by mouth 2 (two) times daily.   MULTI-VITAMIN DAILY PO Take 1 tablet by mouth daily.   mupirocin ointment 2 % Commonly known as:  BACTROBAN APPLY TO AFFECTED AREA(S) TWO TIMES PER DAY AS NEEDED What changed:  See the new instructions.   nitroGLYCERIN 0.4 MG SL tablet Commonly known as:  NITROSTAT Place 1 tablet (0.4 mg total) under the tongue every 5 (five) minutes as needed for chest pain.   pantoprazole 40 MG tablet Commonly known as:  PROTONIX Take 1 tablet (40 mg total) by mouth daily. What changed:  when to take this   sertraline 50 MG tablet Commonly known as:  ZOLOFT Take 25 mg by mouth daily for mood What changed:  See the new instructions.  simvastatin 20 MG tablet Commonly known as:  ZOCOR TAKE 1 TABLET BY MOUTH DAILY AT 6PM. What changed:  See the new instructions.   sodium bicarbonate 650 MG tablet Take 650 mg by mouth 3 (three) times daily.   traZODone 100 MG tablet Commonly known as:  DESYREL Take 0.5 tablets (50 mg total) by mouth at bedtime as needed for sleep. What changed:  See the new instructions.   VISINE OP Place 2 drops into both eyes as needed (for dry eyes).       Contact information for follow-up providers    MD at SNF. Schedule an appointment as soon as possible for a visit in 3 day(s).   Why:  To be seen with repeat labs (CBC & CMP).       [name], MD. Schedule an appointment as soon as possible for a visit today.   Specialty:  Family Medicine Why:  Upon discharge from SNF. Contact information: [address] [address] [phone]        Out patient Orthopedics. Schedule an appointment as soon as possible for a visit today.   Why:  Patient can follow up with the same MD she had sseen prior to admission.            Contact information for after-discharge care    Destination    [facility] .   Specialty:  Skilled Nursing Facility Contact information: [place] [phone]                 Allergies  Allergen Reactions  . Atorvastatin Other (See Comments)    Other reaction(s): Muscle Pain  . Crestor [Rosuvastatin Calcium] Other (See Comments)    Other reaction(s): Muscle Pain  . Tape Other (See Comments)    Makes a bruise on my skin. Paper tape is ok to use.  . Pravastatin Sodium Other (See Comments)    Muscle pain    Procedures/Studies: Dg Chest 2 View  Result Date: 07/01/2016 CLINICAL DATA:  Acute onset of difficulty ambulating. Altered mental status. Initial encounter. EXAM: CHEST  2 VIEW COMPARISON:  Chest radiograph performed 04/14/2016 FINDINGS: The lungs are well-aerated. Right midlung and left basilar opacities likely reflect atelectasis, though mild infection might have a similar appearance. There is no evidence of pleural effusion or pneumothorax. The heart is enlarged.  No acute osseous abnormalities are seen. IMPRESSION: Cardiomegaly. Right midlung and left basilar airspace opacities likely reflect atelectasis, though mild infection might have a similar appearance, depending on the patient's symptoms. Electronically Signed   By: Garald Balding M.D.   On: 07/01/2016 06:11   Dg Lumbar Spine Complete  Result Date: 06/25/2016 CLINICAL DATA:  Patient status post fall. Lower back pain radiating down the left leg. Initial encounter. EXAM: LUMBAR SPINE - COMPLETE 4+ VIEW COMPARISON:  Lumbar spine radiograph 03/04/2011 FINDINGS: Rightward curvature of the lumbar spine. Marked multilevel degenerative disc disease and facet disease involving the lumbar spine. Multilevel facet degenerative changes. Height loss of the L1, L2 and L3 vertebral bodies. SI joints are unremarkable. Probable calcified fibroid projecting over the  pelvis. Right upper quadrant surgical clips. IMPRESSION: Rightward curvature of the lumbar spine with marked multilevel degenerative disc and facet disease limiting evaluation. There is height loss of the L1, L2 and L3 vertebral bodies which is somewhat progressed from prior examination dating 2012. While these findings may be degenerative in etiology, traumatic injury is not entirely excluded. Recommend correlation  for point tenderness. Aortic vascular calcification. Electronically Signed   By: Lovey Newcomer M.D.   On: 06/25/2016 11:23   Dg Forearm Right  Result Date: 06/25/2016 CLINICAL DATA:  Pt fell Wednesday. C/o pain to lower back radiating down left leg which started prior to fall. Swelling, pain, and bruising noted to right forearm and hand. EXAM: RIGHT FOREARM - 2 VIEW COMPARISON:  None. FINDINGS: No fracture of the radius or ulna. The elbow joint and the wrist joint appear normal on two views. IMPRESSION: No fracture or dislocation. Electronically Signed   By: Suzy Bouchard M.D.   On: 06/25/2016 11:17   Dg Wrist Complete Right  Result Date: 06/25/2016 CLINICAL DATA:  Pt fell Wednesday. C/o pain to lower back radiating down left leg which started prior to fall. Swelling, pain, and bruising noted to right forearm and hand. EXAM: RIGHT WRIST - COMPLETE 3+ VIEW COMPARISON:  None. FINDINGS: Nondisplaced fracture at the base of the ulnar styloid. No other fractures. Mild ulnar minus variance. There is narrowing of the first metacarpal trapezium articulation. There is prominent spurring at the base of the first metacarpal with deformity of the base of the first metacarpal suggesting an old fracture. Remaining joints are normally spaced and aligned. Bones are demineralized. IMPRESSION: 1. Nondisplaced fracture of the ulnar styloid. No other fracture. No dislocation. Electronically Signed   By: Lajean Manes M.D.   On: 06/25/2016 11:20   Ct Head Wo Contrast  Result Date: 07/01/2016 CLINICAL DATA:   Acute onset of altered mental status. Recent fall. Initial encounter. EXAM: CT HEAD WITHOUT CONTRAST TECHNIQUE: Contiguous axial images were obtained from the base of the skull through the vertex without intravenous contrast. COMPARISON:  CT of the head performed 02/17/2014 FINDINGS: Brain: No evidence of acute infarction, hemorrhage, hydrocephalus, extra-axial collection or mass lesion/mass effect. Prominence of the ventricles and sulci reflects mild cortical volume loss. Scattered periventricular and subcortical white matter change likely reflects small vessel ischemic microangiopathy. The brainstem and fourth ventricle are within normal limits. The basal ganglia are unremarkable in appearance. The cerebral hemispheres demonstrate grossly normal gray-white differentiation. No mass effect or midline shift is seen. Vascular: No hyperdense vessel or unexpected calcification. Skull: There is no evidence of fracture; visualized osseous structures are unremarkable in appearance. Sinuses/Orbits: The visualized portions of the orbits are within normal limits. The paranasal sinuses and mastoid air cells are well-aerated. Other: No significant soft tissue abnormalities are seen. IMPRESSION: 1. No acute intracranial pathology seen on CT. 2. Mild cortical volume loss and scattered small vessel ischemic microangiopathy. Electronically Signed   By: Garald Balding M.D.   On: 07/01/2016 06:17   Mr Brain Wo Contrast  Result Date: 07/01/2016 CLINICAL DATA:  Initial evaluation for acute altered mental status. EXAM: MRI HEAD WITHOUT CONTRAST TECHNIQUE: Multiplanar, multiecho pulse sequences of the brain and surrounding structures were obtained without intravenous contrast. COMPARISON:  Prior CT from 06/30/2016. FINDINGS: Diffuse prominence of the CSF containing spaces is compatible with generalized cerebral atrophy. Mild scattered T2/FLAIR signal abnormality within the periventricular and deep white matter, most likely related  chronic microvascular ischemic disease, minimal for age. No abnormal foci of restricted diffusion to suggest acute ischemia. Gray-white matter differentiation maintained. Major intracranial vascular flow voids are preserved. Right vertebral artery hypoplastic. No evidence for acute or chronic intracranial hemorrhage. No areas of chronic infarction. No mass lesion, midline shift, or mass effect. No hydrocephalus. No extra-axial fluid collection. Major dural sinuses are grossly patent. Craniocervical junction normal. Visualized upper  cervical spine without acute abnormality. Pituitary gland within normal limits. No acute abnormality about the globes and orbits. Mild mucosal thickening within the paranasal sinuses. No air-fluid level to suggest active sinus infection. Trace opacity bilateral mastoid air cells. Inner ear structures grossly normal. Bone marrow signal intensity within normal limits. No scalp soft tissue abnormality. IMPRESSION: Negative brain MRI for patient age. No acute intracranial process identified. Electronically Signed   By: Jeannine Boga M.D.   On: 07/01/2016 05:22   Dg Chest Port 1 View  Result Date: 07/02/2016 CLINICAL DATA:  Mid chest pain and diffuse abdominal pain for the past 2 weeks. EXAM: PORTABLE CHEST 1 VIEW COMPARISON:  06/30/2016. FINDINGS: Stable enlarged cardiac silhouette and linear density in the right upper lung zone. Resolved left basilar atelectasis. Interval small amount of patchy density at the right lung base. The aortic calcifications. Thoracic spine degenerative changes. IMPRESSION: 1. Interval mild right basilar pneumonia or patchy atelectasis. 2. Stable cardiomegaly. 3. Stable linear atelectasis or scarring on the right. 4. Resolved left basilar atelectasis. Electronically Signed   By: Claudie Revering M.D.   On: 07/02/2016 09:47   Dg Abd Portable 1v  Result Date: 07/02/2016 CLINICAL DATA:  Generalized abdominal pain and mid chest pain for couple weeks. EXAM:  PORTABLE ABDOMEN - 1 VIEW COMPARISON:  None. FINDINGS: Two supine views. Cholecystectomy clips. No gaseous distention of bowel loops. No gross free intraperitoneal air. Extensive vascular calcifications. Right abdominal surgical clips. Right pelvic calcifications measuring 3.2 cm are nonspecific but could relate to dystrophic fibroids. Lumbar spinal curvature and spondylosis. Right hip osteoarthritis. IMPRESSION: No acute findings. Right pelvic calcifications which could relate to underlying dystrophic uterine fibroids but are nonspecific. These results will be called to the ordering clinician or representative by the Radiology Department at the imaging location. Electronically Signed   By: Abigail Miyamoto M.D.   On: 07/02/2016 09:47      Subjective: Patient denies complaints. No chest pain, dyspnea, cough. She did not report pain elsewhere either. As per RN, no acute issues.  Discharge Exam:  Vitals:   07/05/16 0400 07/05/16 0600 07/05/16 1000 07/05/16 1134  BP: (!) 149/59 (!) 130/55 (!) 146/112 126/88  Pulse: 72 68 76 71  Resp: _0 Temp:    98.7 F (37.1 C)  TempSrc:    Oral  SpO2: 97% 98% 90% 95%  Height:        General exam: Pleasant elderly female lying comfortably supine in bed.  Respiratory system: clear to auscultation without wheezing or rhonchi. Respiratory effort normal. Cardiovascular system: S1 & S2 heard, RRR. No JVD, murmurs, rubs, gallops or clicks. No pedal edema. telemetry: SR in the 60's with BBB morphology.  Gastrointestinal system: Abdomen is nondistended, soft and nontender. No organomegaly or masses felt. Normal bowel sounds heard. Central nervous system: Alert and oriented. . No focal neurological deficits. Extremities: Symmetric 5 x 5 power. Right wrist cast Skin: No rashes, lesions or ulcers Psychiatry: Judgement and insight appear slightly impaired. Mood & affect appropriate.     The results of significant diagnostics from this hospitalization  (including imaging, microbiology, ancillary and laboratory) are listed below for reference.     Microbiology: Recent Results (from the past 240 hour(s))  Urine culture     Status: None   Collection Time: 07/01/16  1:43 AM  Result Value Ref Range Status   Specimen Description URINE, CATHETERIZED  Final   Special Requests NONE  Final   Culture NO GROWTH  Final  Report Status 07/02/2016 FINAL  Final  MRSA PCR Screening     Status: Abnormal   Collection Time: 07/01/16  9:21 AM  Result Value Ref Range Status   MRSA by PCR POSITIVE (A) NEGATIVE Final    Comment:        The GeneXpert MRSA Assay (FDA approved for NASAL specimens only), is one component of a comprehensive MRSA colonization surveillance program. It is not intended to diagnose MRSA infection nor to guide or monitor treatment for MRSA infections. RESULT CALLED TO, READ BACK BY AND VERIFIED WITH: Maryjo Rochester RN 11:50 07/01/16 ([email or web address])      Labs: BNP (last 3 results) No results for input(s): BNP in the last 8760 hours. Basic Metabolic Panel:  Recent Labs Lab 07/01/16 0059 07/01/16 0122 07/01/16 0655 07/01/16 1658 07/02/16 0433 07/03/16 0423  NA 139 140 139 141 142 140  K 5.5* 5.4* 5.4* 4.5 4.2 4.2  CL 105 107 105 109 109 105  CO2 24  --  _0 GLUCOSE 114* 108* 94 98 97 100*  BUN 35* 37* 36* 35* 33* 25*  CREATININE 2.27* 2.20* 2.21* 1.62* 1.37* 0.92  CALCIUM 8.5*  --  8.7* 8.1* 8.9 9.2  MG 2.0  --   --   --   --   --    Liver Function Tests:  Recent Labs Lab 07/01/16 0059 07/01/16 0655  AST 123* 121*  ALT 86* 95*  ALKPHOS 89 88  BILITOT 0.6 0.7  PROT 6.4* 6.6  ALBUMIN 3.8 3.7    Recent Labs Lab 07/01/16 0059  LIPASE 30    Recent Labs Lab 07/01/16 0655  AMMONIA 22   CBC:  Recent Labs Lab 07/01/16 0059 07/01/16 0122 07/01/16 0655 07/02/16 0433 07/03/16 0423  WBC 9.6  --  9.3 8.4 8.5  NEUTROABS 8.3*  --  7.7  --   --   HGB 8.6* 10.2* 8.7* 8.2* 8.3*  HCT 33.5* 30.0* 32.9*  31.2* 30.8*  MCV 84.8  --  83.7 83.9 82.4  PLT 202  --  195 176 191   Cardiac Enzymes:  Recent Labs Lab 07/02/16 0441 07/02/16 1039  TROPONINI 0.09* 0.07*   BNP: Invalid input(s): POCBNP CBG:  Recent Labs Lab 07/01/16 0832  GLUCAP 92   Urinalysis    Component Value Date/Time   COLORURINE AMBER (A) 07/01/2016 0130   APPEARANCEUR HAZY (A) 07/01/2016 0130   APPEARANCEUR Clear 03/13/2014 1427   LABSPEC 1.026 07/01/2016 0130   LABSPEC 1.026 03/13/2014 1427   PHURINE 5.0 07/01/2016 0130   GLUCOSEU NEGATIVE 07/01/2016 0130   GLUCOSEU Negative 03/13/2014 1427   HGBUR NEGATIVE 07/01/2016 0130   BILIRUBINUR SMALL (A) 07/01/2016 0130   BILIRUBINUR Negative 03/13/2014 1427   KETONESUR NEGATIVE 07/01/2016 0130   PROTEINUR 100 (A) 07/01/2016 0130   UROBILINOGEN 0.2 03/17/2009 1135   NITRITE NEGATIVE 07/01/2016 0130   LEUKOCYTESUR NEGATIVE 07/01/2016 0130   LEUKOCYTESUR 1+ 03/13/2014 1427      Time coordinating discharge: Over 30 minutes  SIGNED:  Vernell Leep, MD, FACP, [place]. [facility] Pager [phone]  If 7PM-7AM, please contact night-coverage [email or web address] Password [email or web address] 07/05/2016, 1:17 PM

## 2016-07-10 DIAGNOSIS — M545 Low back pain: Secondary | ICD-10-CM | POA: Diagnosis not present

## 2016-07-10 DIAGNOSIS — R51 Headache: Secondary | ICD-10-CM | POA: Diagnosis not present

## 2016-07-10 DIAGNOSIS — L03113 Cellulitis of right upper limb: Secondary | ICD-10-CM | POA: Diagnosis not present

## 2016-07-10 DIAGNOSIS — R2689 Other abnormalities of gait and mobility: Secondary | ICD-10-CM | POA: Diagnosis not present

## 2016-07-10 DIAGNOSIS — R0609 Other forms of dyspnea: Secondary | ICD-10-CM | POA: Diagnosis not present

## 2016-07-10 DIAGNOSIS — J9600 Acute respiratory failure, unspecified whether with hypoxia or hypercapnia: Secondary | ICD-10-CM | POA: Diagnosis not present

## 2016-07-15 ENCOUNTER — Ambulatory Visit: Payer: Self-pay | Admitting: Cardiovascular Disease

## 2016-07-29 ENCOUNTER — Other Ambulatory Visit: Payer: Self-pay | Admitting: Family Medicine

## 2016-07-29 MED ORDER — HYDROCODONE-ACETAMINOPHEN 10-325 MG PO TABS: 0.5000 | ORAL_TABLET | Freq: Three times a day (TID) | ORAL | 0 refills | Status: DC | PRN

## 2016-07-29 NOTE — Telephone Encounter (Signed)
Pt contacted office for refill request on the following medications:  HYDROcodone-acetaminophen (NORCO) 10-325 MG tablet.  CB#[phone]/MW

## 2016-08-01 DIAGNOSIS — S63391A Traumatic rupture of other ligament of right wrist, initial encounter: Secondary | ICD-10-CM | POA: Diagnosis not present

## 2016-08-04 DIAGNOSIS — S52521A Torus fracture of lower end of right radius, initial encounter for closed fracture: Secondary | ICD-10-CM | POA: Diagnosis not present

## 2016-08-05 ENCOUNTER — Telehealth: Payer: Self-pay | Admitting: Cardiovascular Disease

## 2016-08-05 NOTE — Telephone Encounter (Signed)
3 attempts to schedule fu from recall list.  Deleting recall.

## 2016-08-09 ENCOUNTER — Telehealth: Payer: Self-pay | Admitting: Family Medicine

## 2016-08-09 NOTE — Telephone Encounter (Signed)
Pt is being discharged from [facility] today for  Right arm fracture.  I have scheduled a follow up appointment/MW

## 2016-08-11 ENCOUNTER — Other Ambulatory Visit: Payer: Self-pay | Admitting: Family Medicine

## 2016-08-11 DIAGNOSIS — Z9181 History of falling: Secondary | ICD-10-CM | POA: Diagnosis not present

## 2016-08-11 DIAGNOSIS — E039 Hypothyroidism, unspecified: Secondary | ICD-10-CM | POA: Diagnosis not present

## 2016-08-11 DIAGNOSIS — I5032 Chronic diastolic (congestive) heart failure: Secondary | ICD-10-CM | POA: Diagnosis not present

## 2016-08-11 DIAGNOSIS — N184 Chronic kidney disease, stage 4 (severe): Secondary | ICD-10-CM | POA: Diagnosis not present

## 2016-08-11 DIAGNOSIS — W19XXXD Unspecified fall, subsequent encounter: Secondary | ICD-10-CM | POA: Diagnosis not present

## 2016-08-11 DIAGNOSIS — G894 Chronic pain syndrome: Secondary | ICD-10-CM | POA: Diagnosis not present

## 2016-08-11 DIAGNOSIS — F329 Major depressive disorder, single episode, unspecified: Secondary | ICD-10-CM | POA: Diagnosis not present

## 2016-08-11 DIAGNOSIS — J449 Chronic obstructive pulmonary disease, unspecified: Secondary | ICD-10-CM | POA: Diagnosis not present

## 2016-08-11 DIAGNOSIS — R296 Repeated falls: Secondary | ICD-10-CM | POA: Diagnosis not present

## 2016-08-11 DIAGNOSIS — Z9981 Dependence on supplemental oxygen: Secondary | ICD-10-CM | POA: Diagnosis not present

## 2016-08-11 DIAGNOSIS — S62001D Unspecified fracture of navicular [scaphoid] bone of right wrist, subsequent encounter for fracture with routine healing: Secondary | ICD-10-CM | POA: Diagnosis not present

## 2016-08-11 DIAGNOSIS — M4800 Spinal stenosis, site unspecified: Secondary | ICD-10-CM | POA: Diagnosis not present

## 2016-08-11 DIAGNOSIS — Z7902 Long term (current) use of antithrombotics/antiplatelets: Secondary | ICD-10-CM | POA: Diagnosis not present

## 2016-08-11 DIAGNOSIS — M545 Low back pain: Secondary | ICD-10-CM | POA: Diagnosis not present

## 2016-08-11 DIAGNOSIS — I2510 Atherosclerotic heart disease of native coronary artery without angina pectoris: Secondary | ICD-10-CM | POA: Diagnosis not present

## 2016-08-11 DIAGNOSIS — I130 Hypertensive heart and chronic kidney disease with heart failure and stage 1 through stage 4 chronic kidney disease, or unspecified chronic kidney disease: Secondary | ICD-10-CM | POA: Diagnosis not present

## 2016-08-16 ENCOUNTER — Ambulatory Visit (INDEPENDENT_AMBULATORY_CARE_PROVIDER_SITE_OTHER): Payer: Medicare Other | Admitting: Family Medicine

## 2016-08-16 ENCOUNTER — Encounter: Payer: Self-pay | Admitting: Family Medicine

## 2016-08-16 VITALS — BP 128/78 | HR 55 | Temp 98.3°F | Resp 18 | Wt 192.0 lb

## 2016-08-16 DIAGNOSIS — I6523 Occlusion and stenosis of bilateral carotid arteries: Secondary | ICD-10-CM

## 2016-08-16 DIAGNOSIS — E2839 Other primary ovarian failure: Secondary | ICD-10-CM

## 2016-08-16 DIAGNOSIS — J449 Chronic obstructive pulmonary disease, unspecified: Secondary | ICD-10-CM

## 2016-08-16 DIAGNOSIS — Z23 Encounter for immunization: Secondary | ICD-10-CM | POA: Diagnosis not present

## 2016-08-16 DIAGNOSIS — R21 Rash and other nonspecific skin eruption: Secondary | ICD-10-CM | POA: Diagnosis not present

## 2016-08-16 DIAGNOSIS — M48061 Spinal stenosis, lumbar region without neurogenic claudication: Secondary | ICD-10-CM

## 2016-08-16 MED ORDER — MUPIROCIN 2 % EX OINT: TOPICAL_OINTMENT | CUTANEOUS | 5 refills | Status: AC

## 2016-08-16 MED ORDER — HYDROCODONE-ACETAMINOPHEN 10-325 MG PO TABS: 1.0000 | ORAL_TABLET | ORAL | 0 refills | Status: DC | PRN

## 2016-08-16 NOTE — Progress Notes (Signed)
Patient: Carolyn Hendricks Female    DOB: [birth year]   80 y.o.   MRN: [id number] Visit Date: 08/16/2016  Today's Provider: [name], MD   Chief Complaint  Patient presents with  . Hospitalization Follow-up   Subjective:    HPI  Follow up Hospitalization  Patient was admitted to [facility] on 06/25/2016 and discharged on 06/29/2016. She was treated for right ulnar fracture. Treatment for this included a right wrist cast (seen by orthopedics on 9/20).  She reports good compliance with treatment. She reports this condition is Unchanged.   Patient was also admitted to [facility] ED on 06/30/2016 with symptoms of confusion and hallucinations. Diagnosis included acute hypoxic and hypercapnic respiratory failure, possible pneumonia (in which she was treated with 5 days of Levaquin), and Amlodipine 74m was added due to acute kidney injury. Patient was later discharged on 07/05/16 to [facility]. Patient reports that she was there for 7 weeks, and she was discharged last week to her home.   Patient also mentions that she had a MRSA infection on her left breast while she was in the hospital, and does not think this was treated. She reports that she still has the rash/wound present and it seems to be getting worse.   She had follow up with orthopedics (Dr. KLisette Grinder last week and is slowly improving. She does not recall the last time she had a bone density test.   She also requests refill for pain medications. She has long history of spinal stenosis and had been taking hydrocodone/apap every 4 hours prior to hospitalization and reports adequate pain control.     Allergies  Allergen Reactions  . Atorvastatin Other (See Comments)    Other reaction(s): Muscle Pain  . Crestor [Rosuvastatin Calcium] Other (See Comments)    Other reaction(s): Muscle Pain  . Tape Other (See Comments)    Makes a bruise on my skin. Paper tape is ok to use.  . Pravastatin Sodium Other (See Comments)      Muscle pain     Current Outpatient Prescriptions:  .  ADVAIR DISKUS 250-50 MCG/DOSE AEPB, INHALE ONE (1) PUFF EVERY 12 HOURS AS DIRECTED. RINSE MOUTH AFTER EACH USE., Disp: 60 each, Rfl: 5 .  albuterol (PROVENTIL) (2.5 MG/3ML) 0.083% nebulizer solution, Take 3 mLs (2.5 mg total) by nebulization every 4 (four) hours as needed for wheezing or shortness of breath., Disp: , Rfl:  .  amLODipine (NORVASC) 10 MG tablet, Take 1 tablet (10 mg total) by mouth daily., Disp: , Rfl:  .  aspirin 81 MG tablet, Take 81 mg by mouth every morning. Reported on 01/07/2016, Disp: , Rfl:  .  cetirizine (ZYRTEC) 10 MG tablet, Take 10 mg by mouth at bedtime. , Disp: , Rfl:  .  clopidogrel (PLAVIX) 75 MG tablet, Take 1 tablet (75 mg total) by mouth every morning., Disp: 30 tablet, Rfl: 5 .  fluticasone (FLONASE) 50 MCG/ACT nasal spray, INHALE ONE TO TWO SPRAY IN EACH NOSTRIL DAILY AS NEEDED (Patient taking differently: INHALE ONE TO TWO SPRAY IN EACH NOSTRIL DAILY AS NEEDED FOR ALLERGIES), Disp: 16 g, Rfl: 6 .  furosemide (LASIX) 20 MG tablet, Take 20 mg by mouth daily as needed for fluid. Pt reports she does not take lasix everyday, my bladder is so small, it makes me go to the bathroom a lot., Disp: , Rfl:  .  HYDROcodone-acetaminophen (NORCO) 10-325 MG tablet, Take 0.5 tablets by mouth every 8 (eight) hours as  needed for moderate pain or severe pain., Disp: 10 tablet, Rfl: 0 .  isosorbide mononitrate (IMDUR) 30 MG 24 hr tablet, TAKE 1 TABLET BY MOUTH DAILY FOR HEART (Patient taking differently: Take 30 mg by mouth daily for heart), Disp: 30 tablet, Rfl: 6 .  LORazepam (ATIVAN) 0.5 MG tablet, Take 0.5 tablets (0.25 mg total) by mouth every 12 (twelve) hours as needed for anxiety., Disp: 6 tablet, Rfl: 0 .  metoprolol (LOPRESSOR) 100 MG tablet, Take 1 tablet (100 mg total) by mouth 2 (two) times daily., Disp: 180 tablet, Rfl: 3 .  Multiple Vitamin (MULTI-VITAMIN DAILY PO), Take 1 tablet by mouth daily. , Disp: , Rfl:   .  mupirocin ointment (BACTROBAN) 2 %, APPLY TO AFFECTED AREA(S) TWO TIMES PER DAY AS NEEDED (Patient taking differently: APPLY TO AFFECTED AREA(S) TWO TIMES PER DAY AS NEEDED FOR SKIN), Disp: 22 g, Rfl: 5 .  pantoprazole (PROTONIX) 40 MG tablet, Take 1 tablet (40 mg total) by mouth daily. (Patient taking differently: Take 40 mg by mouth every morning. ), Disp: 30 tablet, Rfl: 0 .  sertraline (ZOLOFT) 50 MG tablet, Take 25 mg by mouth daily for mood, Disp: 10 tablet, Rfl: 0 .  sodium bicarbonate 650 MG tablet, Take 650 mg by mouth 3 (three) times daily., Disp: , Rfl:  .  Tetrahydrozoline HCl (VISINE OP), Place 2 drops into both eyes as needed (for dry eyes)., Disp: , Rfl:  .  traZODone (DESYREL) 100 MG tablet, Take 0.5 tablets (50 mg total) by mouth at bedtime as needed for sleep., Disp: , Rfl:  .  levothyroxine (SYNTHROID, LEVOTHROID) 75 MCG tablet, TAKE 1 TABLET BY MOUTH DAILY (FOR THYROID) (Patient taking differently: Take 75 mcg by mouth daily), Disp: 30 tablet, Rfl: 12 .  nitroGLYCERIN (NITROSTAT) 0.4 MG SL tablet, Place 1 tablet (0.4 mg total) under the tongue every 5 (five) minutes as needed for chest pain., Disp: 25 tablet, Rfl: 3 .  simvastatin (ZOCOR) 20 MG tablet, TAKE 1 TABLET BY MOUTH DAILY AT 6PM. (Patient taking differently: Take 20 mg by mouth daily), Disp: 90 tablet, Rfl: 3  Review of Systems  Constitutional: Positive for activity change and fatigue.  Respiratory: Positive for shortness of breath.        With exertion.   Cardiovascular: Negative for chest pain, palpitations and leg swelling.  Musculoskeletal: Positive for arthralgias and gait problem.       Pain in right wrist.   Skin: Positive for rash and wound.       On left breast.   Neurological: Negative for dizziness and headaches.    Social History  Substance Use Topics  . Smoking status: Former Smoker    Packs/day: 1.00    Years: 26.00    Types: Cigarettes    Quit date: 10/11/2011  . Smokeless tobacco: Never  Used     Comment: Patient has smoked for 26 years; has quit but now started back 0.25 ppw.  . Alcohol use No   Objective:   BP 128/78 (BP Location: Left Arm, Patient Position: Sitting, Cuff Size: Normal)   Pulse (!) 55   Temp 98.3 F (36.8 C)   Resp 18   Wt 192 lb (87.1 kg)   SpO2 92%   BMI 37.50 kg/m   Physical Exam   General Appearance:    Alert, cooperative, no distress  Eyes:    PERRL, conjunctiva/corneas clear, EOM's intact       Lungs:     Clear to auscultation bilaterally,  respirations unlabored  Heart:    Regular rate and rhythm  Neurologic:   Awake, alert, oriented x 3. No apparent focal neurological           defect.           Assessment & Plan:     1. Estrogen deficiency  - DG Bone Density; Future  2. Spinal stenosis of lumbar region without neurogenic claudication Pain fairly well controlled on current regiement.  - HYDROcodone-acetaminophen (NORCO) 10-325 MG tablet; Take 1 tablet by mouth every 4 (four) hours as needed for moderate pain or severe pain.  Dispense: 180 tablet; Refill: 0  3. Need for influenza vaccination  - Flu vaccine HIGH DOSE PF  4. Chronic obstructive pulmonary disease, unspecified COPD type ([place]) Back to baseline with no signs of current exacerbation. Continue Advair BID.   5. Rash Start back on mupiricin ointment.  - mupirocin ointment (BACTROBAN) 2 %; APPLY TO AFFECTED AREA(S) TWO TIMES PER DAY AS NEEDED  Dispense: 22 g; Refill: 5       [name], MD  [facility]

## 2016-08-17 DIAGNOSIS — I130 Hypertensive heart and chronic kidney disease with heart failure and stage 1 through stage 4 chronic kidney disease, or unspecified chronic kidney disease: Secondary | ICD-10-CM | POA: Diagnosis not present

## 2016-08-17 DIAGNOSIS — M545 Low back pain: Secondary | ICD-10-CM | POA: Diagnosis not present

## 2016-08-17 DIAGNOSIS — S62001D Unspecified fracture of navicular [scaphoid] bone of right wrist, subsequent encounter for fracture with routine healing: Secondary | ICD-10-CM | POA: Diagnosis not present

## 2016-08-17 DIAGNOSIS — I5032 Chronic diastolic (congestive) heart failure: Secondary | ICD-10-CM | POA: Diagnosis not present

## 2016-08-17 DIAGNOSIS — J449 Chronic obstructive pulmonary disease, unspecified: Secondary | ICD-10-CM | POA: Diagnosis not present

## 2016-08-17 DIAGNOSIS — N184 Chronic kidney disease, stage 4 (severe): Secondary | ICD-10-CM | POA: Diagnosis not present

## 2016-08-18 DIAGNOSIS — I5032 Chronic diastolic (congestive) heart failure: Secondary | ICD-10-CM | POA: Diagnosis not present

## 2016-08-18 DIAGNOSIS — I130 Hypertensive heart and chronic kidney disease with heart failure and stage 1 through stage 4 chronic kidney disease, or unspecified chronic kidney disease: Secondary | ICD-10-CM | POA: Diagnosis not present

## 2016-08-18 DIAGNOSIS — M545 Low back pain: Secondary | ICD-10-CM | POA: Diagnosis not present

## 2016-08-18 DIAGNOSIS — S62001D Unspecified fracture of navicular [scaphoid] bone of right wrist, subsequent encounter for fracture with routine healing: Secondary | ICD-10-CM | POA: Diagnosis not present

## 2016-08-18 DIAGNOSIS — J449 Chronic obstructive pulmonary disease, unspecified: Secondary | ICD-10-CM | POA: Diagnosis not present

## 2016-08-18 DIAGNOSIS — N184 Chronic kidney disease, stage 4 (severe): Secondary | ICD-10-CM | POA: Diagnosis not present

## 2016-08-22 DIAGNOSIS — I5032 Chronic diastolic (congestive) heart failure: Secondary | ICD-10-CM | POA: Diagnosis not present

## 2016-08-22 DIAGNOSIS — S62001D Unspecified fracture of navicular [scaphoid] bone of right wrist, subsequent encounter for fracture with routine healing: Secondary | ICD-10-CM | POA: Diagnosis not present

## 2016-08-22 DIAGNOSIS — N184 Chronic kidney disease, stage 4 (severe): Secondary | ICD-10-CM | POA: Diagnosis not present

## 2016-08-22 DIAGNOSIS — M545 Low back pain: Secondary | ICD-10-CM | POA: Diagnosis not present

## 2016-08-22 DIAGNOSIS — J449 Chronic obstructive pulmonary disease, unspecified: Secondary | ICD-10-CM | POA: Diagnosis not present

## 2016-08-22 DIAGNOSIS — I130 Hypertensive heart and chronic kidney disease with heart failure and stage 1 through stage 4 chronic kidney disease, or unspecified chronic kidney disease: Secondary | ICD-10-CM | POA: Diagnosis not present

## 2016-08-23 DIAGNOSIS — I5032 Chronic diastolic (congestive) heart failure: Secondary | ICD-10-CM | POA: Diagnosis not present

## 2016-08-23 DIAGNOSIS — I130 Hypertensive heart and chronic kidney disease with heart failure and stage 1 through stage 4 chronic kidney disease, or unspecified chronic kidney disease: Secondary | ICD-10-CM | POA: Diagnosis not present

## 2016-08-23 DIAGNOSIS — J449 Chronic obstructive pulmonary disease, unspecified: Secondary | ICD-10-CM | POA: Diagnosis not present

## 2016-08-23 DIAGNOSIS — N184 Chronic kidney disease, stage 4 (severe): Secondary | ICD-10-CM | POA: Diagnosis not present

## 2016-08-23 DIAGNOSIS — M545 Low back pain: Secondary | ICD-10-CM | POA: Diagnosis not present

## 2016-08-23 DIAGNOSIS — S62001D Unspecified fracture of navicular [scaphoid] bone of right wrist, subsequent encounter for fracture with routine healing: Secondary | ICD-10-CM | POA: Diagnosis not present

## 2016-08-30 DIAGNOSIS — M545 Low back pain: Secondary | ICD-10-CM | POA: Diagnosis not present

## 2016-08-30 DIAGNOSIS — S62001D Unspecified fracture of navicular [scaphoid] bone of right wrist, subsequent encounter for fracture with routine healing: Secondary | ICD-10-CM | POA: Diagnosis not present

## 2016-08-30 DIAGNOSIS — J449 Chronic obstructive pulmonary disease, unspecified: Secondary | ICD-10-CM | POA: Diagnosis not present

## 2016-08-30 DIAGNOSIS — N184 Chronic kidney disease, stage 4 (severe): Secondary | ICD-10-CM | POA: Diagnosis not present

## 2016-08-30 DIAGNOSIS — I130 Hypertensive heart and chronic kidney disease with heart failure and stage 1 through stage 4 chronic kidney disease, or unspecified chronic kidney disease: Secondary | ICD-10-CM | POA: Diagnosis not present

## 2016-08-30 DIAGNOSIS — I5032 Chronic diastolic (congestive) heart failure: Secondary | ICD-10-CM | POA: Diagnosis not present

## 2016-09-02 DIAGNOSIS — I5032 Chronic diastolic (congestive) heart failure: Secondary | ICD-10-CM | POA: Diagnosis not present

## 2016-09-02 DIAGNOSIS — J449 Chronic obstructive pulmonary disease, unspecified: Secondary | ICD-10-CM | POA: Diagnosis not present

## 2016-09-02 DIAGNOSIS — S62001D Unspecified fracture of navicular [scaphoid] bone of right wrist, subsequent encounter for fracture with routine healing: Secondary | ICD-10-CM | POA: Diagnosis not present

## 2016-09-02 DIAGNOSIS — N184 Chronic kidney disease, stage 4 (severe): Secondary | ICD-10-CM | POA: Diagnosis not present

## 2016-09-02 DIAGNOSIS — M545 Low back pain: Secondary | ICD-10-CM | POA: Diagnosis not present

## 2016-09-02 DIAGNOSIS — I130 Hypertensive heart and chronic kidney disease with heart failure and stage 1 through stage 4 chronic kidney disease, or unspecified chronic kidney disease: Secondary | ICD-10-CM | POA: Diagnosis not present

## 2016-09-05 DIAGNOSIS — N184 Chronic kidney disease, stage 4 (severe): Secondary | ICD-10-CM | POA: Diagnosis not present

## 2016-09-05 DIAGNOSIS — S62001D Unspecified fracture of navicular [scaphoid] bone of right wrist, subsequent encounter for fracture with routine healing: Secondary | ICD-10-CM | POA: Diagnosis not present

## 2016-09-05 DIAGNOSIS — J449 Chronic obstructive pulmonary disease, unspecified: Secondary | ICD-10-CM | POA: Diagnosis not present

## 2016-09-05 DIAGNOSIS — I5032 Chronic diastolic (congestive) heart failure: Secondary | ICD-10-CM | POA: Diagnosis not present

## 2016-09-05 DIAGNOSIS — I130 Hypertensive heart and chronic kidney disease with heart failure and stage 1 through stage 4 chronic kidney disease, or unspecified chronic kidney disease: Secondary | ICD-10-CM | POA: Diagnosis not present

## 2016-09-05 DIAGNOSIS — M545 Low back pain: Secondary | ICD-10-CM | POA: Diagnosis not present

## 2016-09-07 DIAGNOSIS — J449 Chronic obstructive pulmonary disease, unspecified: Secondary | ICD-10-CM | POA: Diagnosis not present

## 2016-09-07 DIAGNOSIS — I5032 Chronic diastolic (congestive) heart failure: Secondary | ICD-10-CM | POA: Diagnosis not present

## 2016-09-07 DIAGNOSIS — N184 Chronic kidney disease, stage 4 (severe): Secondary | ICD-10-CM | POA: Diagnosis not present

## 2016-09-07 DIAGNOSIS — M545 Low back pain: Secondary | ICD-10-CM | POA: Diagnosis not present

## 2016-09-07 DIAGNOSIS — I130 Hypertensive heart and chronic kidney disease with heart failure and stage 1 through stage 4 chronic kidney disease, or unspecified chronic kidney disease: Secondary | ICD-10-CM | POA: Diagnosis not present

## 2016-09-07 DIAGNOSIS — S62001D Unspecified fracture of navicular [scaphoid] bone of right wrist, subsequent encounter for fracture with routine healing: Secondary | ICD-10-CM | POA: Diagnosis not present

## 2016-09-09 ENCOUNTER — Telehealth: Payer: Self-pay | Admitting: Family Medicine

## 2016-09-09 DIAGNOSIS — M48061 Spinal stenosis, lumbar region without neurogenic claudication: Secondary | ICD-10-CM

## 2016-09-09 NOTE — Telephone Encounter (Signed)
Please review-aa

## 2016-09-09 NOTE — Telephone Encounter (Signed)
Pt's daughter Debracontacted office for refill request on the following medications: HYDROcodone-acetaminophen (NORCO) 10-325 MG tablet Last written: 08/16/16 Last OV: 08/16/16 Debra stated pt will run out the first of next week and would like to pick up the Rx Monday 09/12/16 if possible. Please advise. Thanks TNP

## 2016-09-12 ENCOUNTER — Other Ambulatory Visit: Payer: Self-pay | Admitting: Family Medicine

## 2016-09-12 MED ORDER — HYDROCODONE-ACETAMINOPHEN 10-325 MG PO TABS: 1.0000 | ORAL_TABLET | ORAL | 0 refills | Status: DC | PRN

## 2016-09-14 DIAGNOSIS — N184 Chronic kidney disease, stage 4 (severe): Secondary | ICD-10-CM | POA: Diagnosis not present

## 2016-09-14 DIAGNOSIS — M545 Low back pain: Secondary | ICD-10-CM | POA: Diagnosis not present

## 2016-09-14 DIAGNOSIS — I5032 Chronic diastolic (congestive) heart failure: Secondary | ICD-10-CM | POA: Diagnosis not present

## 2016-09-14 DIAGNOSIS — S62001D Unspecified fracture of navicular [scaphoid] bone of right wrist, subsequent encounter for fracture with routine healing: Secondary | ICD-10-CM | POA: Diagnosis not present

## 2016-09-14 DIAGNOSIS — J449 Chronic obstructive pulmonary disease, unspecified: Secondary | ICD-10-CM | POA: Diagnosis not present

## 2016-09-14 DIAGNOSIS — I130 Hypertensive heart and chronic kidney disease with heart failure and stage 1 through stage 4 chronic kidney disease, or unspecified chronic kidney disease: Secondary | ICD-10-CM | POA: Diagnosis not present

## 2016-09-15 DIAGNOSIS — S62001D Unspecified fracture of navicular [scaphoid] bone of right wrist, subsequent encounter for fracture with routine healing: Secondary | ICD-10-CM | POA: Diagnosis not present

## 2016-09-15 DIAGNOSIS — J449 Chronic obstructive pulmonary disease, unspecified: Secondary | ICD-10-CM | POA: Diagnosis not present

## 2016-09-15 DIAGNOSIS — N184 Chronic kidney disease, stage 4 (severe): Secondary | ICD-10-CM | POA: Diagnosis not present

## 2016-09-15 DIAGNOSIS — M545 Low back pain: Secondary | ICD-10-CM | POA: Diagnosis not present

## 2016-09-15 DIAGNOSIS — I5032 Chronic diastolic (congestive) heart failure: Secondary | ICD-10-CM | POA: Diagnosis not present

## 2016-09-15 DIAGNOSIS — I130 Hypertensive heart and chronic kidney disease with heart failure and stage 1 through stage 4 chronic kidney disease, or unspecified chronic kidney disease: Secondary | ICD-10-CM | POA: Diagnosis not present

## 2016-09-21 DIAGNOSIS — I130 Hypertensive heart and chronic kidney disease with heart failure and stage 1 through stage 4 chronic kidney disease, or unspecified chronic kidney disease: Secondary | ICD-10-CM | POA: Diagnosis not present

## 2016-09-21 DIAGNOSIS — M545 Low back pain: Secondary | ICD-10-CM | POA: Diagnosis not present

## 2016-09-21 DIAGNOSIS — N184 Chronic kidney disease, stage 4 (severe): Secondary | ICD-10-CM | POA: Diagnosis not present

## 2016-09-21 DIAGNOSIS — J449 Chronic obstructive pulmonary disease, unspecified: Secondary | ICD-10-CM | POA: Diagnosis not present

## 2016-09-21 DIAGNOSIS — I5032 Chronic diastolic (congestive) heart failure: Secondary | ICD-10-CM | POA: Diagnosis not present

## 2016-09-21 DIAGNOSIS — S62001D Unspecified fracture of navicular [scaphoid] bone of right wrist, subsequent encounter for fracture with routine healing: Secondary | ICD-10-CM | POA: Diagnosis not present

## 2016-09-23 DIAGNOSIS — M545 Low back pain: Secondary | ICD-10-CM | POA: Diagnosis not present

## 2016-09-23 DIAGNOSIS — I130 Hypertensive heart and chronic kidney disease with heart failure and stage 1 through stage 4 chronic kidney disease, or unspecified chronic kidney disease: Secondary | ICD-10-CM | POA: Diagnosis not present

## 2016-09-23 DIAGNOSIS — I5032 Chronic diastolic (congestive) heart failure: Secondary | ICD-10-CM | POA: Diagnosis not present

## 2016-09-23 DIAGNOSIS — J449 Chronic obstructive pulmonary disease, unspecified: Secondary | ICD-10-CM | POA: Diagnosis not present

## 2016-09-23 DIAGNOSIS — S62001D Unspecified fracture of navicular [scaphoid] bone of right wrist, subsequent encounter for fracture with routine healing: Secondary | ICD-10-CM | POA: Diagnosis not present

## 2016-09-23 DIAGNOSIS — N184 Chronic kidney disease, stage 4 (severe): Secondary | ICD-10-CM | POA: Diagnosis not present

## 2016-09-27 DIAGNOSIS — S52521A Torus fracture of lower end of right radius, initial encounter for closed fracture: Secondary | ICD-10-CM | POA: Diagnosis not present

## 2016-10-04 ENCOUNTER — Ambulatory Visit: Payer: Medicare Other

## 2016-10-11 ENCOUNTER — Other Ambulatory Visit: Payer: Self-pay | Admitting: Family Medicine

## 2016-10-11 DIAGNOSIS — M48061 Spinal stenosis, lumbar region without neurogenic claudication: Secondary | ICD-10-CM

## 2016-10-11 NOTE — Telephone Encounter (Signed)
Pt contacted office for refill request on the following medications:  HYDROcodone-acetaminophen (NORCO) 10-325 MG tablet.  CB#[phone]/MW

## 2016-10-12 ENCOUNTER — Encounter (INDEPENDENT_AMBULATORY_CARE_PROVIDER_SITE_OTHER): Payer: Medicare Other | Admitting: Family Medicine

## 2016-10-12 DIAGNOSIS — J449 Chronic obstructive pulmonary disease, unspecified: Secondary | ICD-10-CM | POA: Diagnosis not present

## 2016-10-12 DIAGNOSIS — I130 Hypertensive heart and chronic kidney disease with heart failure and stage 1 through stage 4 chronic kidney disease, or unspecified chronic kidney disease: Secondary | ICD-10-CM

## 2016-10-12 DIAGNOSIS — S62001D Unspecified fracture of navicular [scaphoid] bone of right wrist, subsequent encounter for fracture with routine healing: Secondary | ICD-10-CM | POA: Diagnosis not present

## 2016-10-12 DIAGNOSIS — D631 Anemia in chronic kidney disease: Secondary | ICD-10-CM | POA: Diagnosis not present

## 2016-10-12 DIAGNOSIS — I129 Hypertensive chronic kidney disease with stage 1 through stage 4 chronic kidney disease, or unspecified chronic kidney disease: Secondary | ICD-10-CM | POA: Diagnosis not present

## 2016-10-12 DIAGNOSIS — N183 Chronic kidney disease, stage 3 (moderate): Secondary | ICD-10-CM | POA: Diagnosis not present

## 2016-10-12 DIAGNOSIS — E875 Hyperkalemia: Secondary | ICD-10-CM | POA: Diagnosis not present

## 2016-10-12 DIAGNOSIS — N2581 Secondary hyperparathyroidism of renal origin: Secondary | ICD-10-CM | POA: Diagnosis not present

## 2016-10-12 DIAGNOSIS — E872 Acidosis: Secondary | ICD-10-CM | POA: Diagnosis not present

## 2016-10-12 MED ORDER — HYDROCODONE-ACETAMINOPHEN 10-325 MG PO TABS: 1.0000 | ORAL_TABLET | ORAL | 0 refills | Status: DC | PRN

## 2016-11-04 ENCOUNTER — Encounter: Payer: Self-pay | Admitting: Family Medicine

## 2016-11-14 ENCOUNTER — Other Ambulatory Visit: Payer: Self-pay | Admitting: Family Medicine

## 2016-11-14 ENCOUNTER — Ambulatory Visit: Payer: Medicare Other

## 2016-11-14 DIAGNOSIS — M48061 Spinal stenosis, lumbar region without neurogenic claudication: Secondary | ICD-10-CM

## 2016-11-14 MED ORDER — HYDROCODONE-ACETAMINOPHEN 10-325 MG PO TABS: 1.0000 | ORAL_TABLET | ORAL | 0 refills | Status: DC | PRN

## 2016-11-14 NOTE — Telephone Encounter (Signed)
Pt contacted office for refill request on the following medications:  HYDROcodone-acetaminophen (NORCO) 10-325 MG tablet.  CB#[phone]/MW

## 2016-11-14 NOTE — Telephone Encounter (Signed)
Last refill 10/12/2016. LOV 08/26/2016. Renaldo Fiddler, CMA

## 2016-12-11 ENCOUNTER — Emergency Department
Admission: EM | Admit: 2016-12-11 | Discharge: 2016-12-11 | Disposition: A | Discharge status: Home / Self Care | Payer: Medicare Other | Attending: Emergency Medicine | Admitting: Emergency Medicine

## 2016-12-11 ENCOUNTER — Emergency Department: Payer: Medicare Other

## 2016-12-11 ENCOUNTER — Encounter: Payer: Self-pay | Admitting: Emergency Medicine

## 2016-12-11 DIAGNOSIS — N183 Chronic kidney disease, stage 3 (moderate): Secondary | ICD-10-CM | POA: Insufficient documentation

## 2016-12-11 DIAGNOSIS — R197 Diarrhea, unspecified: Secondary | ICD-10-CM

## 2016-12-11 DIAGNOSIS — K5730 Diverticulosis of large intestine without perforation or abscess without bleeding: Secondary | ICD-10-CM | POA: Diagnosis not present

## 2016-12-11 DIAGNOSIS — I130 Hypertensive heart and chronic kidney disease with heart failure and stage 1 through stage 4 chronic kidney disease, or unspecified chronic kidney disease: Secondary | ICD-10-CM | POA: Insufficient documentation

## 2016-12-11 DIAGNOSIS — E039 Hypothyroidism, unspecified: Secondary | ICD-10-CM | POA: Insufficient documentation

## 2016-12-11 DIAGNOSIS — Z87891 Personal history of nicotine dependence: Secondary | ICD-10-CM | POA: Insufficient documentation

## 2016-12-11 DIAGNOSIS — I509 Heart failure, unspecified: Secondary | ICD-10-CM | POA: Insufficient documentation

## 2016-12-11 DIAGNOSIS — R1084 Generalized abdominal pain: Secondary | ICD-10-CM | POA: Diagnosis present

## 2016-12-11 DIAGNOSIS — Z79899 Other long term (current) drug therapy: Secondary | ICD-10-CM | POA: Insufficient documentation

## 2016-12-11 DIAGNOSIS — R112 Nausea with vomiting, unspecified: Secondary | ICD-10-CM

## 2016-12-11 DIAGNOSIS — E860 Dehydration: Secondary | ICD-10-CM | POA: Insufficient documentation

## 2016-12-11 DIAGNOSIS — Z7982 Long term (current) use of aspirin: Secondary | ICD-10-CM | POA: Diagnosis not present

## 2016-12-11 DIAGNOSIS — E876 Hypokalemia: Secondary | ICD-10-CM | POA: Diagnosis not present

## 2016-12-11 HISTORY — DX: Disorder of kidney and ureter, unspecified: N28.9

## 2016-12-11 LAB — CBC
HCT: 31.4 % — ABNORMAL LOW (ref 35.0–47.0)
Hemoglobin: 9.4 g/dL — ABNORMAL LOW (ref 12.0–16.0)
MCH: 21.2 pg — ABNORMAL LOW (ref 26.0–34.0)
MCHC: 30 g/dL — ABNORMAL LOW (ref 32.0–36.0)
MCV: 70.6 fL — ABNORMAL LOW (ref 80.0–100.0)
Platelets: 175 10*3/uL (ref 150–440)
RBC: 4.45 MIL/uL (ref 3.80–5.20)
RDW: 18.3 % — ABNORMAL HIGH (ref 11.5–14.5)
WBC: 4.9 10*3/uL (ref 3.6–11.0)

## 2016-12-11 LAB — COMPREHENSIVE METABOLIC PANEL
ALT: 10 U/L — ABNORMAL LOW (ref 14–54)
AST: 24 U/L (ref 15–41)
Albumin: 3.8 g/dL (ref 3.5–5.0)
Alkaline Phosphatase: 50 U/L (ref 38–126)
Anion gap: 10 (ref 5–15)
BUN: 14 mg/dL (ref 6–20)
CO2: 32 mmol/L (ref 22–32)
Calcium: 8.6 mg/dL — ABNORMAL LOW (ref 8.9–10.3)
Chloride: 97 mmol/L — ABNORMAL LOW (ref 101–111)
Creatinine, Ser: 0.97 mg/dL (ref 0.44–1.00)
GFR calc Af Amer: >60 mL/min (ref >60)
GFR calc non Af Amer: 55 mL/min — ABNORMAL LOW (ref >60)
Glucose, Bld: 90 mg/dL (ref 65–99)
Potassium: 2.7 mmol/L — CL (ref 3.5–5.1)
Sodium: 139 mmol/L (ref 135–145)
Total Bilirubin: 1.3 mg/dL — ABNORMAL HIGH (ref 0.3–1.2)
Total Protein: 6.5 g/dL (ref 6.5–8.1)

## 2016-12-11 LAB — URINALYSIS, COMPLETE (UACMP) WITH MICROSCOPIC
Bilirubin Urine: NEGATIVE
Glucose, UA: NEGATIVE mg/dL
Ketones, ur: NEGATIVE mg/dL
Leukocytes, UA: NEGATIVE
Nitrite: NEGATIVE
Protein, ur: 100 mg/dL — AB
Specific Gravity, Urine: 1.009 (ref 1.005–1.030)
pH: 8 (ref 5.0–8.0)

## 2016-12-11 LAB — LIPASE, BLOOD: LIPASE: 30 U/L (ref 11–51)

## 2016-12-11 MED ORDER — ONDANSETRON 4 MG PO TBDP: 4.0000 mg | ORAL_TABLET | Freq: Three times a day (TID) | ORAL | 0 refills | Status: AC | PRN

## 2016-12-11 MED ORDER — ONDANSETRON HCL 4 MG/2ML IJ SOLN
4.0000 mg | Freq: Once | INTRAMUSCULAR | Status: AC
  Administered 2016-12-11: 4 mg via INTRAVENOUS
  Filled 2016-12-11: qty 2

## 2016-12-11 MED ORDER — ACETAMINOPHEN 500 MG PO TABS
ORAL_TABLET | ORAL | Status: AC
  Administered 2016-12-11: 1000 mg
  Filled 2016-12-11: qty 2

## 2016-12-11 MED ORDER — IOPAMIDOL (ISOVUE-300) INJECTION 61%
15.0000 mL | Freq: Once | INTRAVENOUS | Status: AC | PRN
  Administered 2016-12-11: 15 mL via ORAL

## 2016-12-11 MED ORDER — POTASSIUM CHLORIDE CRYS ER 20 MEQ PO TBCR
40.0000 meq | EXTENDED_RELEASE_TABLET | Freq: Once | ORAL | Status: AC
  Administered 2016-12-11: 40 meq via ORAL
  Filled 2016-12-11: qty 2

## 2016-12-11 MED ORDER — SODIUM CHLORIDE 0.9 % IV BOLUS (SEPSIS)
1000.0000 mL | Freq: Once | INTRAVENOUS | Status: AC
  Administered 2016-12-11: 1000 mL via INTRAVENOUS

## 2016-12-11 MED ORDER — IOPAMIDOL (ISOVUE-300) INJECTION 61%
100.0000 mL | Freq: Once | INTRAVENOUS | Status: AC | PRN
  Administered 2016-12-11: 100 mL via INTRAVENOUS

## 2016-12-11 NOTE — ED Provider Notes (Signed)
CT abd/pel IMPRESSION:  1. Nonspecific presacral stranding, with question of underlying soft  tissue inflammation about the rectum. Would correlate clinically for  any evidence of proctitis.  2. Mildly nodular contour of the liver raises concern for mild  hepatic cirrhosis. Trace ascites noted about the liver.  3. Small right and trace left pleural effusions. Right basilar  atelectasis noted.  4. Mild cardiomegaly.  5. Focal scarring at the midportion of the right kidney.  6. Scattered diverticulosis along the sigmoid colon, without  evidence of diverticulitis.  7. Ectatic abdominal aorta at risk for aneurysm development.  Recommend followup by ultrasound in 5 years. This recommendation  follows ACR consensus guidelines: White Paper of the ACR Incidental  Findings Committee II on Vascular Findings. J Am Coll Radiol 2013;  10:789-794.  8. Likely mild luminal narrowing along the common iliac arteries  bilaterally, secondary to calcific atherosclerotic disease.  Scattered aortic atherosclerosis.  9. Mild right convex thoracolumbar scoliosis. Partial chronic  osseous fusion at L1-L2.  10. Multiple calcified uterine fibroids noted.    Discussed finding of CT scan with patient and family. Discussed importance of follow up on liver and aortic issues with family and patient. Will discharge with prescription    Nance Pear, MD 12/11/16 1017

## 2016-12-11 NOTE — ED Notes (Signed)
Pt given apple sauce, crackers and gingerale

## 2016-12-11 NOTE — ED Triage Notes (Signed)
Pt reports she has been dealing with N/V/D for past 3 days last episode of emesis last night, diarrhea all night last episode about 30 minutes ago. Reports feeling weak, denies  taking any antibiotics for her MRSA to rt ear and nose. Pt talks in complete sentences no respiratory distress noted

## 2016-12-11 NOTE — Discharge Instructions (Signed)
Please seek medical attention for any high fevers, chest pain, shortness of breath, change in behavior, persistent vomiting, bloody stool or any other new or concerning symptoms.

## 2016-12-11 NOTE — ED Provider Notes (Signed)
[facility] Emergency Department Provider Note  ____________________________________________  Time seen: Approximately 2:19 PM  I have reviewed the triage vital signs and the nursing notes.   HISTORY  Chief Complaint Nausea; Emesis; and Diarrhea    HPI Carolyn Hendricks is a 81 y.o. female who complains of generalized abdominal pain nausea vomiting and diarrhea for the past 3 days. Has been able to take some medicines but not others. No blood in the vomit or diarrhea. No fever or chills. Feels generalized weakness.    Past Medical History:  Diagnosis Date  . Arthritis   . Chest pain   . Chronic kidney disease   . Diastolic dysfunction   . Dizziness   . Head ache   . Heart murmur   . High blood pressure   . History of MRSA infection   . Shortness of breath      Patient Active Problem List   Diagnosis Date Noted  . Hypoxia 07/01/2016  . Acute encephalopathy 07/01/2016  . Renal failure (ARF), acute on chronic (HCC) 07/01/2016  . Acute respiratory failure with hypoxia ([place]) 07/01/2016  . S/P laparoscopic cholecystectomy 12/01/2015  . Umbilical hernia without obstruction and without gangrene   . COPD (chronic obstructive pulmonary disease) ([place]) 10/22/2015  . Peripheral vascular disease ([place]) 10/15/2015  . Difficulty in walking 10/15/2015  . Airway hyperreactivity 10/15/2015  . Gallstone pancreatitis   . MRSA cellulitis 06/29/2015  . SOB (shortness of breath)   . LBBB (left bundle branch block)   . Angina pectoris ([place]) 05/14/2015  . Leg swelling 05/14/2015  . Allergic rhinitis 04/17/2015  . Anxiety 04/17/2015  . Arthralgia of hand 04/17/2015  . Chronic kidney disease (CKD), stage III (moderate) 04/17/2015  . Fatigue 04/17/2015  . Numbness and tingling 04/17/2015  . Nonspecific abnormal finding in stool contents 04/17/2015  . Restless leg 04/17/2015  . Hypothyroidism 07/09/2014  . Abnormal mammogram 11/01/2012  . Carotid stenosis  03/28/2012  . Atrial tachycardia ([place]) 03/28/2012  . Hyperlipidemia 03/28/2012  . Carotid artery obstruction 03/28/2012  . Supraventricular tachycardia ([place]) 03/28/2012  . Hyperpotassemia 02/27/2010  . Heart murmur 03/10/2009  . Insomnia 03/05/2009  . Essential (primary) hypertension 01/29/2009  . Goiter 01/29/2009  . History of tobacco use 01/29/2009  . Spinal stenosis of lumbar region without neurogenic claudication 07/14/2003     Past Surgical History:  Procedure Laterality Date  . APPENDECTOMY  1950  . CARDIAC CATHETERIZATION N/A 06/12/2015   Procedure: Left Heart Cath and Coronary Angiography;  Surgeon: Minna Merritts, MD;  Location: [facility];  Service: Cardiovascular;  Laterality: N/A;  . CAROTID ENDARTERECTOMY Left Dr. Lucky Cowboy  . CHOLECYSTECTOMY N/A 12/01/2015   Procedure: LAPAROSCOPIC CHOLECYSTECTOMY WITH INTRAOPERATIVE CHOLANGIOGRAM;  Surgeon: Hubbard Robinson, MD;  Location: [facility];  Service: General;  Laterality: N/A;  . foot and ankle surgery    . FRACTURE SURGERY    . JOINT REPLACEMENT    . knee replacement (other) Left 2002     Prior to Admission medications   Medication Sig Start Date End Date Taking? Authorizing Provider  ADVAIR DISKUS 250-50 MCG/DOSE AEPB INHALE ONE (1) PUFF EVERY 12 HOURS AS DIRECTED. RINSE MOUTH AFTER EACH USE. 08/11/16   Birdie Sons, MD  albuterol (PROVENTIL) (2.5 MG/3ML) 0.083% nebulizer solution Take 3 mLs (2.5 mg total) by nebulization every 4 (four) hours as needed for wheezing or shortness of breath. 07/05/16   Modena Jansky, MD  amLODipine (NORVASC) 10 MG tablet Take 1 tablet (  10 mg total) by mouth daily. 07/06/16   Modena Jansky, MD  aspirin 81 MG tablet Take 81 mg by mouth every morning. Reported on 01/07/2016    Historical Provider, MD  cetirizine (ZYRTEC) 10 MG tablet Take 10 mg by mouth at bedtime.     Historical Provider, MD  clopidogrel (PLAVIX) 75 MG tablet Take 1 tablet (75 mg total) by mouth every morning.  05/23/16   Birdie Sons, MD  fluticasone (FLONASE) 50 MCG/ACT nasal spray INHALE ONE TO TWO SPRAY IN EACH NOSTRIL DAILY AS NEEDED 09/12/16   Birdie Sons, MD  furosemide (LASIX) 20 MG tablet Take 20 mg by mouth daily as needed for fluid. Pt reports she does not take lasix everyday, my bladder is so small, it makes me go to the bathroom a lot. 09/26/15   Historical Provider, MD  HYDROcodone-acetaminophen (NORCO) 10-325 MG tablet Take 1 tablet by mouth every 4 (four) hours as needed for moderate pain or severe pain. 11/14/16   Birdie Sons, MD  isosorbide mononitrate (IMDUR) 30 MG 24 hr tablet TAKE 1 TABLET BY MOUTH DAILY FOR HEART Patient taking differently: Take 30 mg by mouth daily for heart 05/10/16   Minna Merritts, MD  levothyroxine (SYNTHROID, LEVOTHROID) 75 MCG tablet TAKE 1 TABLET BY MOUTH DAILY (FOR THYROID) Patient taking differently: Take 75 mcg by mouth daily 04/13/16   Birdie Sons, MD  LORazepam (ATIVAN) 0.5 MG tablet Take 0.5 tablets (0.25 mg total) by mouth every 12 (twelve) hours as needed for anxiety. 07/05/16   Modena Jansky, MD  meloxicam (MOBIC) 15 MG tablet TAKE ONE (1) TABLET BY MOUTH DAILY AS NEEDED FOR ARTHRITIS PAIN. 09/12/16   Birdie Sons, MD  metoprolol (LOPRESSOR) 100 MG tablet Take 1 tablet (100 mg total) by mouth 2 (two) times daily. 03/08/16   Minna Merritts, MD  Multiple Vitamin (MULTI-VITAMIN DAILY PO) Take 1 tablet by mouth daily.     Historical Provider, MD  mupirocin ointment (BACTROBAN) 2 % APPLY TO AFFECTED AREA(S) TWO TIMES PER DAY AS NEEDED 08/16/16   Birdie Sons, MD  nitroGLYCERIN (NITROSTAT) 0.4 MG SL tablet Place 1 tablet (0.4 mg total) under the tongue every 5 (five) minutes as needed for chest pain. 05/14/15   Minna Merritts, MD  pantoprazole (PROTONIX) 40 MG tablet Take 1 tablet (40 mg total) by mouth daily. Patient taking differently: Take 40 mg by mouth every morning.  10/02/15   Demetrios Loll, MD  sertraline (ZOLOFT) 50 MG tablet Take 25 mg  by mouth daily for mood 07/05/16   Modena Jansky, MD  simvastatin (ZOCOR) 20 MG tablet TAKE 1 TABLET BY MOUTH DAILY AT 6PM. Patient taking differently: Take 20 mg by mouth daily 04/13/16   Minna Merritts, MD  sodium bicarbonate 650 MG tablet Take 650 mg by mouth 3 (three) times daily.    Historical Provider, MD  Tetrahydrozoline HCl (VISINE OP) Place 2 drops into both eyes as needed (for dry eyes).    Historical Provider, MD  traZODone (DESYREL) 100 MG tablet Take 0.5 tablets (50 mg total) by mouth at bedtime as needed for sleep. 07/05/16   Modena Jansky, MD     Allergies Atorvastatin; Crestor [rosuvastatin calcium]; Tape; and Pravastatin sodium   Family History  Problem Relation Age of Onset  . Congestive Heart Failure Mother 15  . Cancer Brother   . Kidney failure Father     Social History Social History  Substance  Use Topics  . Smoking status: Former Smoker    Packs/day: 1.00    Years: 26.00    Types: Cigarettes    Quit date: 10/11/2011  . Smokeless tobacco: Never Used     Comment: Patient has smoked for 26 years; has quit but now started back 0.25 ppw.  . Alcohol use No    Review of Systems  Constitutional:   No fever or chills.  ENT:   No sore throat. No rhinorrhea. Cardiovascular:   No chest pain. Respiratory:   No dyspnea or cough. Gastrointestinal:   Positive abdominal pain vomiting diarrhea.  Genitourinary:   Negative for dysuria or difficulty urinating. Musculoskeletal:   Negative for focal pain or swelling Neurological:   Negative for headaches 10-point ROS otherwise negative.  ____________________________________________   PHYSICAL EXAM:  VITAL SIGNS: ED Triage Vitals  Enc Vitals Group     BP --      Pulse --      Resp --      Temp --      Temp src --      SpO2 --      Weight 12/11/16 1038 200 lb (90.7 kg)     Height 12/11/16 1038 5' (1.524 m)     Head Circumference --      Peak Flow --      Pain Score 12/11/16 1039 10     Pain Loc --       Pain Edu? --      Excl. in [place]? --     Vital signs reviewed, nursing assessments reviewed.   Constitutional:   Alert and oriented. Well appearing and in no distress. Eyes:   No scleral icterus. No conjunctival pallor. PERRL. EOMI.  No nystagmus. ENT   Head:   Normocephalic and atraumatic.   Nose:   No congestion/rhinnorhea. No septal hematoma   Mouth/Throat:   Dry mucous membranes, no pharyngeal erythema. No peritonsillar mass.    Neck:   No stridor. No SubQ emphysema. No meningismus. Hematological/Lymphatic/Immunilogical:   No cervical lymphadenopathy. Cardiovascular:   RRR. Symmetric bilateral radial and DP pulses.  No murmurs.  Respiratory:   Normal respiratory effort without tachypnea nor retractions. Breath sounds are clear and equal bilaterally. No wheezes/rales/rhonchi. Gastrointestinal:   Soft with mild diffuse tenderness, no focal findings. Non distended.  No rebound, rigidity, or guarding. Genitourinary:   deferred Musculoskeletal:   Normal range of motion in all extremities. No joint effusions.  No lower extremity tenderness.  No edema. Neurologic:   Normal speech and language.  CN 2-10 normal. Motor grossly intact. No gross focal neurologic deficits are appreciated.  Skin:    Skin is warm, dry and intact. No rash noted.  No petechiae, purpura, or bullae.  ____________________________________________    LABS (pertinent positives/negatives) (all labs ordered are listed, but only abnormal results are displayed) Labs Reviewed  COMPREHENSIVE METABOLIC PANEL - Abnormal; Notable for the following:       Result Value   Potassium 2.7 (*)    Chloride 97 (*)    Calcium 8.6 (*)    ALT 10 (*)    Total Bilirubin 1.3 (*)    GFR calc non Af Amer 55 (*)    All other components within normal limits  CBC - Abnormal; Notable for the following:    Hemoglobin 9.4 (*)    HCT 31.4 (*)    MCV 70.6 (*)    MCH 21.2 (*)    MCHC 30.0 (*)    RDW  18.3 (*)    All other  components within normal limits  URINALYSIS, COMPLETE (UACMP) WITH MICROSCOPIC - Abnormal; Notable for the following:    Color, Urine STRAW (*)    APPearance CLEAR (*)    Hgb urine dipstick SMALL (*)    Protein, ur 100 (*)    Bacteria, UA RARE (*)    Squamous Epithelial / LPF 0-5 (*)    All other components within normal limits  LIPASE, BLOOD   ____________________________________________   EKG  Interpreted by me Rhythm rate of 77, left axis, left bundle branch block. No acute ischemic changes.  ____________________________________________    RADIOLOGY  No results found. CT abdomen and pelvis pending ____________________________________________   PROCEDURES Procedures  ____________________________________________   INITIAL IMPRESSION / ASSESSMENT AND PLAN / ED COURSE  Pertinent labs & imaging results that were available during my care of the patient were reviewed by me and considered in my medical decision making (see chart for details).  Patient well appearing no acute distress. Presents with abdominal pain vomiting diarrhea, assistant with viral gastroenteritis.Considering the patient's symptoms, medical history, and physical examination today, I have low suspicion for cholecystitis or biliary pathology, pancreatitis, perforation or bowel obstruction, hernia, intra-abdominal abscess, AAA or dissection, volvulus or intussusception, mesenteric ischemia, or appendicitis.  After Zofran, patient's tolerating oral intake including crackers applesauce and fluids. Patient given Drucilla Chalet for her mild hypokalemia.    ----------------------------------------- 3:42 PM on 12/11/2016 -----------------------------------------  Patient tolerating oral intake. However, due to age and comorbidities, we'll get a CT scan of the abdomen and pelvis. If no severe findings, Patient is suitable for outpatient follow-up.  The patient is being discharged home, would prescribe  Zofran.  Case signed out to Dr. Archie Balboa to follow-up on CT scan for disposition.       ____________________________________________   FINAL CLINICAL IMPRESSION(S) / ED DIAGNOSES  Final diagnoses:  Nausea vomiting and diarrhea  Hypokalemia  Mild dehydration      New Prescriptions   No medications on file  Zofran ODT   Portions of this note were generated with dragon dictation software. Dictation errors may occur despite best attempts at proofreading.    Carrie Mew, MD 12/11/16 [phone]

## 2016-12-11 NOTE — ED Notes (Signed)
Pt to ct

## 2016-12-11 NOTE — ED Notes (Signed)
Attempted IV to left AC and lt arm not successful, pt tolerated well, reports usually a hard stick. RN informed pt will get a nurse to help with IV pt verbalizes understanding.

## 2016-12-11 NOTE — ED Notes (Signed)
Pt able to hold down food - states abd still hurts but is better. C/o headache and medicated for it. Drinking contrast

## 2016-12-11 NOTE — ED Notes (Signed)
Pt went on bedside commode and then placed back in bed.

## 2016-12-11 NOTE — ED Notes (Signed)
Report received from [facility] - pt here for n/v/d. From home. No iv established after 5 attempts from previous nurses. Pt on bed pan and urine obtained. Iv established by this nurse.

## 2016-12-14 ENCOUNTER — Other Ambulatory Visit: Payer: Self-pay

## 2016-12-14 DIAGNOSIS — M48061 Spinal stenosis, lumbar region without neurogenic claudication: Secondary | ICD-10-CM

## 2016-12-14 MED ORDER — HYDROCODONE-ACETAMINOPHEN 10-325 MG PO TABS: 1.0000 | ORAL_TABLET | ORAL | 0 refills | Status: DC | PRN

## 2016-12-14 NOTE — Telephone Encounter (Signed)
LOV 08/16/2016. Last refill 11/14/2016. Renaldo Fiddler, CMA

## 2016-12-16 ENCOUNTER — Other Ambulatory Visit: Payer: Self-pay | Admitting: Family Medicine

## 2016-12-16 ENCOUNTER — Other Ambulatory Visit: Payer: Self-pay | Admitting: Cardiovascular Disease

## 2016-12-16 NOTE — Telephone Encounter (Signed)
Called patient she is coming 01/03/17 to see Dr Rockey Situ

## 2016-12-16 NOTE — Telephone Encounter (Signed)
Pt needs f/u appt with [facility] for further refills. Thanks.

## 2016-12-19 ENCOUNTER — Telehealth: Payer: Self-pay | Admitting: *Deleted

## 2016-12-19 MED ORDER — MELOXICAM 15 MG PO TABS: ORAL_TABLET | ORAL | 2 refills | Status: DC

## 2016-12-19 MED ORDER — FLUTICASONE PROPIONATE 50 MCG/ACT NA SUSP: NASAL | 2 refills | Status: AC

## 2016-12-19 NOTE — Telephone Encounter (Signed)
Received fax from [facility] stating that pt wants all her meds changed to 90 day supply.

## 2016-12-27 ENCOUNTER — Other Ambulatory Visit: Payer: Self-pay | Admitting: *Deleted

## 2016-12-27 MED ORDER — PANTOPRAZOLE SODIUM 40 MG PO TBEC: 40.0000 mg | DELAYED_RELEASE_TABLET | Freq: Every day | ORAL | 4 refills | Status: AC

## 2017-01-01 NOTE — Progress Notes (Signed)
Cardiology Office Note  Date:  01/03/2017   ID:  Carolyn Hendricks, DOB November 16, 1935, MRN [id number]  PCP:  Carolyn Huh, MD   Chief Complaint  Patient presents with  . other    6 month f/u c/o chest pain and sob. Meds reviewed verbally with pt.    HPI:  Carolyn Hendricks is a 81 year old woman with history of  hypertension,  MRSA,  long smoking history for 20 years,  arthritis,   May 26th 2013  buttock pain from abscess,   atrial tachycardia, heart rate of 150 beats per minute. Heart rate resolved with calcium channel blocker.   75% plus left carotid stenosis. Now status post CEA on the left  episodes of tachycardia lasting for hours at a time dating back many years  chest pain concerning for angina,  cardiac catheterization 06/2015 showing 50% prox LAD She does have severe right common and external iliac disease,    she presents for follow-up of her tachycardia and PAD  In the ER 12/11/16 with nausea, emesis, diarrhea, ABD pain Started one week earlier Potassium 2.7, HCT 31 Still going on, little better Not eating much "bowels are sludgy"  CT scan: 12/11/2016 Ectatic abdominal aorta  Iliac arterial disease  IN the hospital 06/2016 Acute encephalopathy   Essential (primary) hypertension   Hypothyroidism   Renal failure (ARF), acute on chronic (HCC)   Acute respiratory failure with hypoxia ([place]) On bipap, Overnight 9/22, developed chest pain, minimal elevation of troponins, chronic LBBB on EKG.  In follow-up today she does not take Lasix on a regular basis, possibly once a week Some tenderness to palpation of her legs Sits with her legs down all day She reports her breathing is stable, Wears nasal cannula oxygen  Previously used increasing amounts of Lasix for shortness of breath, leg swelling with no improvement in symptoms   EKG on today's visit shows normal sinus rhythm with rate 52 bpm, left bundle branch block, no change since prior EKG   other past medical history  She  was started on Lasix 20 mg daily by Carolyn Hendricks for leg edema. Dosing was increased up to 20 mg twice a day by kidney physician, Dr. Abigail Hendricks. Lab work from 04/27/2015 shows creatinine 1.66, BUN 30 which is well above her baseline  leg swelling on diltiazem , improved by holding the medication   angiography of her carotid 02/20/2014 showing severe disease on the left.   possible TIA , evaluation in the hospital, discharge 02/20/2014. She had dizziness, lightheadedness, bradycardia. She was continued on her beta blocker, Cardizem was held for bradycardia  Echocardiogram performed 02/17/2014 while she was in the hospital. This showed normal ejection fraction, diastolic dysfunction, mild MR and TR She stopped smoking approximately several years ago  She is tolerating simvastatin 10 mg daily with no side effects. She reports having side effects on Crestor and Lipitor.  She has  seen the renal service and diagnosed with stage III chronic kidney disease.  Echocardiogram in the hospital 03/04/2012 showed normal LV systolic function, diastolic dysfunction, otherwise normal study   PMH:   has a past medical history of Arthritis; Chest pain; CHF (congestive heart failure) (Carolyn Hendricks); Chronic kidney disease; Diastolic dysfunction; Dizziness; Head ache; Heart murmur; High blood pressure; History of MRSA infection; Renal insufficiency; and Shortness of breath.  PSH:    Past Surgical History:  Procedure Laterality Date  . APPENDECTOMY  1950  . CARDIAC CATHETERIZATION N/A 06/12/2015   Procedure: Left Heart Cath and Coronary Angiography;  Surgeon: Carolyn Merritts, MD;  Location: [facility];  Service: Cardiovascular;  Laterality: N/A;  . CAROTID ENDARTERECTOMY Left Dr. Lucky Hendricks  . CHOLECYSTECTOMY N/A 12/01/2015   Procedure: LAPAROSCOPIC CHOLECYSTECTOMY WITH INTRAOPERATIVE CHOLANGIOGRAM;  Surgeon: Carolyn Robinson, MD;  Location: [facility];  Service: General;  Laterality: N/A;  . foot and ankle surgery     . FRACTURE SURGERY    . JOINT REPLACEMENT    . knee replacement (other) Left 2002    Current Outpatient Prescriptions  Medication Sig Dispense Refill  . ADVAIR DISKUS 250-50 MCG/DOSE AEPB INHALE ONE (1) PUFF EVERY 12 HOURS AS DIRECTED. RINSE MOUTH AFTER EACH USE. 60 each 5  . albuterol (PROVENTIL) (2.5 MG/3ML) 0.083% nebulizer solution Take 3 mLs (2.5 mg total) by nebulization every 4 (four) hours as needed for wheezing or shortness of breath.    Marland Kitchen amLODipine (NORVASC) 10 MG tablet Take 1 tablet (10 mg total) by mouth daily.    Marland Kitchen aspirin 81 MG tablet Take 81 mg by mouth every morning. Reported on 01/07/2016    . cetirizine (ZYRTEC) 10 MG tablet Take 10 mg by mouth at bedtime.     . clopidogrel (PLAVIX) 75 MG tablet TAKE ONE TABLET BY MOUTH EVERY MORNING 30 tablet 3  . fluticasone (FLONASE) 50 MCG/ACT nasal spray INHALE ONE TO TWO SPRAY IN EACH NOSTRIL DAILY AS NEEDED 42 g 2  . furosemide (LASIX) 20 MG tablet Take 20 mg by mouth daily as needed for fluid. Pt reports she does not take lasix everyday, my bladder is so small, it makes me go to the bathroom a lot.    Marland Kitchen HYDROcodone-acetaminophen (NORCO) 10-325 MG tablet Take 1 tablet by mouth every 4 (four) hours as needed for moderate pain or severe pain. 180 tablet 0  . isosorbide mononitrate (IMDUR) 30 MG 24 hr tablet TAKE 1 TABLET BY MOUTH DAILY FOR HEART 30 tablet 0  . levothyroxine (SYNTHROID, LEVOTHROID) 75 MCG tablet TAKE 1 TABLET BY MOUTH DAILY (FOR THYROID) (Patient taking differently: Take 75 mcg by mouth daily) 30 tablet 12  . LORazepam (ATIVAN) 0.5 MG tablet Take 0.5 tablets (0.25 mg total) by mouth every 12 (twelve) hours as needed for anxiety. 6 tablet 0  . meloxicam (MOBIC) 15 MG tablet TAKE ONE (1) TABLET BY MOUTH DAILY AS NEEDED FOR ARTHRITIS PAIN. 90 tablet 2  . metoprolol (LOPRESSOR) 100 MG tablet Take 1 tablet (100 mg total) by mouth 2 (two) times daily. 180 tablet 3  . mupirocin ointment (BACTROBAN) 2 % APPLY TO AFFECTED AREA(S)  TWO TIMES PER DAY AS NEEDED 22 g 5  . nitroGLYCERIN (NITROSTAT) 0.4 MG SL tablet Place 1 tablet (0.4 mg total) under the tongue every 5 (five) minutes as needed for chest pain. 25 tablet 3  . ondansetron (ZOFRAN ODT) 4 MG disintegrating tablet Take 1 tablet (4 mg total) by mouth every 8 (eight) hours as needed for nausea or vomiting. 20 tablet 0  . pantoprazole (PROTONIX) 40 MG tablet Take 1 tablet (40 mg total) by mouth daily. 90 tablet 4  . sertraline (ZOLOFT) 50 MG tablet Take 25 mg by mouth daily for mood (Patient taking differently: Take 50 mg by mouth at bedtime. ) 10 tablet 0  . simvastatin (ZOCOR) 20 MG tablet Take 20 mg by mouth daily 90 tablet 3  . sodium bicarbonate 650 MG tablet Take 650 mg by mouth 3 (three) times daily.    . traZODone (DESYREL) 100 MG tablet TAKE ONE-HALF OR ONE TABLET  BY MOUTH AT BEDTIME AS NEEDED FOR SLEEP. 30 tablet 3   No current facility-administered medications for this visit.      Allergies:   Atorvastatin; Crestor [rosuvastatin calcium]; Tape; and Pravastatin sodium   Social History:  The patient  reports that she quit smoking about 5 years ago. Her smoking use included Cigarettes. She has a 26.00 pack-year smoking history. She has never used smokeless tobacco. She reports that she does not drink alcohol or use drugs.   Family History:   family history includes Cancer in her brother; Congestive Heart Failure (age of onset: 40) in her mother; Kidney failure in her father.    Review of Systems: Review of Systems  Constitutional: Negative.   Respiratory: Positive for shortness of breath.   Cardiovascular: Positive for leg swelling.  Gastrointestinal: Negative.   Musculoskeletal: Negative.   Neurological: Negative.   Psychiatric/Behavioral: Negative.   All other systems reviewed and are negative.    PHYSICAL EXAM: VS:  BP 134/62 (BP Location: Right Arm, Patient Position: Sitting, Cuff Size: Large)   Pulse (!) 52   Ht 5' (1.524 m)   Wt 203 lb  (92.1 kg)   BMI 39.65 kg/m  , BMI Body mass index is 39.65 kg/m. GEN: Well nourished, well developed, in no acute distress  HEENT: normal  Neck: no JVD, carotid bruits, or masses Cardiac: RRR; no murmurs, rubs, or gallops,no edema  Respiratory:  clear to auscultation bilaterally, normal work of breathing GI: soft, nontender, nondistended, + BS MS: no deformity or atrophy  Skin: warm and dry, no rash Neuro:  Strength and sensation are intact Psych: euthymic mood, full affect    Recent Labs: 07/01/2016: Magnesium 2.0; TSH 0.639 12/11/2016: ALT 10; BUN 14; Creatinine, Ser 0.97; Hemoglobin 9.4; Platelets 175; Potassium 2.7; Sodium 139    Lipid Panel Lab Results  Component Value Date   CHOL 181 12/12/2014   HDL 43 12/12/2014   LDLCALC 94 12/12/2014   TRIG 218 (A) 12/12/2014      Wt Readings from Last 3 Encounters:  01/03/17 203 lb (92.1 kg)  12/11/16 200 lb (90.7 kg)  08/16/16 192 lb (87.1 kg)       ASSESSMENT AND PLAN:  Bilateral carotid artery stenosis - Plan: EKG 84-TXMI, Basic Metabolic Panel (BMET) We have refilled her simvastatin Goal LDL less than 70  Atrial tachycardia (HCC) - Plan: EKG 68-EHOZ, Basic Metabolic Panel (BMET) Despite bradycardia recommended she stay on her current dose of metoprolol. She is asymptomatic Discussed with family in the room with her today  Mixed hyperlipidemia - Plan: EKG 22-QMGN, Basic Metabolic Panel (BMET) Statin has been refilled Has not been on this for some time per the patient  Essential (primary) hypertension - Plan: EKG 00-BBCW, Basic Metabolic Panel (BMET) Blood pressure is well controlled on today's visit. No changes made to the medications.  Angina pectoris ([place]) - Plan: EKG 88-QBVQ, Basic Metabolic Panel (BMET) Currently with no symptoms of angina. No further workup at this time. Continue current medication regimen.  Peripheral vascular disease ([place]) - Plan: EKG 94-HWTU, Basic Metabolic Panel (BMET)  Chronic kidney  disease (CKD), stage III (moderate) - Plan: EKG 88-KCMK, Basic Metabolic Panel (BMET) Repeat basic metabolic panel today given recent low potassium In the setting of diarrhea and vomiting several weeks ago  History of tobacco use - Plan: EKG 34-JZPH, Basic Metabolic Panel (BMET) We have encouraged her to continue to work on weaning her cigarettes and smoking cessation. She will continue to work on this  and does not want any assistance with chantix.   Leg swelling - Plan: EKG 36-IXMD, Basic Metabolic Panel (BMET) Leg swelling likely multifactorial including calcium channel blocker side effect, dependent edema/Venous insufficiency, CHF. We have recommended compression hose  Centrilobular emphysema ([place]) - Plan: EKG 80-IYJG, Basic Metabolic Panel (BMET)  Acute respiratory failure with hypoxia ([place]) - Plan: EKG 94-JSID, Basic Metabolic Panel (BMET) Presenting with oxygen, underlying COPD  Disposition:   F/U  6 months   Total encounter time more than 25 minutes  Greater than 50% was spent in counseling and coordination of care with the patient    Orders Placed This Encounter  Procedures  . Basic Metabolic Panel (BMET)  . EKG 12-Lead     Signed, Esmond Plants, M.D., Ph.D. 01/03/2017  [place], [place]

## 2017-01-03 ENCOUNTER — Encounter: Payer: Self-pay | Admitting: Cardiovascular Disease

## 2017-01-03 ENCOUNTER — Other Ambulatory Visit
Admission: RE | Admit: 2017-01-03 | Discharge: 2017-01-03 | Disposition: A | Discharge status: Home / Self Care | Payer: Medicare Other | Source: Ambulatory Visit | Attending: Cardiovascular Disease | Admitting: Cardiovascular Disease

## 2017-01-03 ENCOUNTER — Ambulatory Visit (INDEPENDENT_AMBULATORY_CARE_PROVIDER_SITE_OTHER): Payer: Medicare Other | Admitting: Cardiovascular Disease

## 2017-01-03 VITALS — BP 134/62 | HR 52 | Ht 60.0 in | Wt 203.0 lb

## 2017-01-03 DIAGNOSIS — E782 Mixed hyperlipidemia: Secondary | ICD-10-CM | POA: Insufficient documentation

## 2017-01-03 DIAGNOSIS — I209 Angina pectoris, unspecified: Secondary | ICD-10-CM

## 2017-01-03 DIAGNOSIS — N183 Chronic kidney disease, stage 3 unspecified: Secondary | ICD-10-CM

## 2017-01-03 DIAGNOSIS — I10 Essential (primary) hypertension: Secondary | ICD-10-CM

## 2017-01-03 DIAGNOSIS — J9601 Acute respiratory failure with hypoxia: Secondary | ICD-10-CM | POA: Insufficient documentation

## 2017-01-03 DIAGNOSIS — I739 Peripheral vascular disease, unspecified: Secondary | ICD-10-CM | POA: Insufficient documentation

## 2017-01-03 DIAGNOSIS — M7989 Other specified soft tissue disorders: Secondary | ICD-10-CM | POA: Insufficient documentation

## 2017-01-03 DIAGNOSIS — I6523 Occlusion and stenosis of bilateral carotid arteries: Secondary | ICD-10-CM | POA: Diagnosis not present

## 2017-01-03 DIAGNOSIS — I471 Supraventricular tachycardia: Secondary | ICD-10-CM

## 2017-01-03 DIAGNOSIS — J432 Centrilobular emphysema: Secondary | ICD-10-CM

## 2017-01-03 DIAGNOSIS — Z87891 Personal history of nicotine dependence: Secondary | ICD-10-CM | POA: Insufficient documentation

## 2017-01-03 LAB — BASIC METABOLIC PANEL
Anion gap: 9 (ref 5–15)
BUN: 45 mg/dL — AB (ref 6–20)
CHLORIDE: 98 mmol/L — AB (ref 101–111)
CO2: 31 mmol/L (ref 22–32)
CREATININE: 2.71 mg/dL — AB (ref 0.44–1.00)
Calcium: 9 mg/dL (ref 8.9–10.3)
GFR, EST AFRICAN AMERICAN: 18 mL/min — AB (ref >60)
GFR, EST NON AFRICAN AMERICAN: 16 mL/min — AB (ref >60)
GLUCOSE: 102 mg/dL — AB (ref 65–99)
Potassium: 4.1 mmol/L (ref 3.5–5.1)
Sodium: 138 mmol/L (ref 135–145)

## 2017-01-03 MED ORDER — SIMVASTATIN 20 MG PO TABS: ORAL_TABLET | ORAL | 3 refills | Status: AC

## 2017-01-03 NOTE — Patient Instructions (Addendum)
Medication Instructions:   No medication changes made  Lasix as needed for leg swelling  Labwork:  BMP today  Testing/Procedures:  No further testing at this time   I recommend watching educational videos on topics of interest to you at:       [email or web address].com  Enter code: [email or web address]    Follow-Up: It was a pleasure seeing you in the office today. Please call us if you have new issues that need to be addressed before your next appt.  [phone]  Your physician wants you to follow-up in: 6 months.  You will receive a reminder letter in the mail two months in advance. If you don't receive a letter, please call our office to schedule the follow-up appointment.  If you need a refill on your cardiac medications before your next appointment, please call your pharmacy.

## 2017-01-03 NOTE — Addendum Note (Signed)
Addended by: Dede Query R on: 01/03/2017 12:12 PM   Modules accepted: Orders

## 2017-01-05 ENCOUNTER — Other Ambulatory Visit: Payer: Self-pay | Admitting: Family Medicine

## 2017-01-05 DIAGNOSIS — M48061 Spinal stenosis, lumbar region without neurogenic claudication: Secondary | ICD-10-CM

## 2017-01-05 MED ORDER — HYDROCODONE-ACETAMINOPHEN 10-325 MG PO TABS: 1.0000 | ORAL_TABLET | ORAL | 0 refills | Status: AC | PRN

## 2017-01-06 ENCOUNTER — Emergency Department (HOSPITAL_COMMUNITY): Payer: Medicare Other

## 2017-01-06 ENCOUNTER — Encounter (HOSPITAL_COMMUNITY): Payer: Self-pay | Admitting: Emergency Medicine

## 2017-01-06 ENCOUNTER — Inpatient Hospital Stay (HOSPITAL_COMMUNITY)
Admission: EM | Admit: 2017-01-06 | Discharge: 2017-01-17 | DRG: 286 | Disposition: A | Discharge status: Skilled Nursing Facility | Payer: Medicare Other | Attending: Family Medicine | Admitting: Family Medicine

## 2017-01-06 DIAGNOSIS — Z79899 Other long term (current) drug therapy: Secondary | ICD-10-CM

## 2017-01-06 DIAGNOSIS — J9622 Acute and chronic respiratory failure with hypercapnia: Secondary | ICD-10-CM | POA: Diagnosis not present

## 2017-01-06 DIAGNOSIS — R278 Other lack of coordination: Secondary | ICD-10-CM | POA: Diagnosis not present

## 2017-01-06 DIAGNOSIS — R112 Nausea with vomiting, unspecified: Secondary | ICD-10-CM | POA: Diagnosis present

## 2017-01-06 DIAGNOSIS — Z9981 Dependence on supplemental oxygen: Secondary | ICD-10-CM

## 2017-01-06 DIAGNOSIS — E785 Hyperlipidemia, unspecified: Secondary | ICD-10-CM | POA: Diagnosis present

## 2017-01-06 DIAGNOSIS — R2681 Unsteadiness on feet: Secondary | ICD-10-CM | POA: Diagnosis not present

## 2017-01-06 DIAGNOSIS — N183 Chronic kidney disease, stage 3 (moderate): Secondary | ICD-10-CM | POA: Diagnosis present

## 2017-01-06 DIAGNOSIS — I2510 Atherosclerotic heart disease of native coronary artery without angina pectoris: Secondary | ICD-10-CM | POA: Diagnosis present

## 2017-01-06 DIAGNOSIS — J9602 Acute respiratory failure with hypercapnia: Secondary | ICD-10-CM | POA: Diagnosis present

## 2017-01-06 DIAGNOSIS — F419 Anxiety disorder, unspecified: Secondary | ICD-10-CM | POA: Diagnosis present

## 2017-01-06 DIAGNOSIS — Z888 Allergy status to other drugs, medicaments and biological substances status: Secondary | ICD-10-CM

## 2017-01-06 DIAGNOSIS — I959 Hypotension, unspecified: Secondary | ICD-10-CM | POA: Diagnosis not present

## 2017-01-06 DIAGNOSIS — I739 Peripheral vascular disease, unspecified: Secondary | ICD-10-CM | POA: Diagnosis present

## 2017-01-06 DIAGNOSIS — M48061 Spinal stenosis, lumbar region without neurogenic claudication: Secondary | ICD-10-CM | POA: Diagnosis present

## 2017-01-06 DIAGNOSIS — Z8249 Family history of ischemic heart disease and other diseases of the circulatory system: Secondary | ICD-10-CM

## 2017-01-06 DIAGNOSIS — R0602 Shortness of breath: Secondary | ICD-10-CM | POA: Diagnosis not present

## 2017-01-06 DIAGNOSIS — D638 Anemia in other chronic diseases classified elsewhere: Secondary | ICD-10-CM | POA: Diagnosis present

## 2017-01-06 DIAGNOSIS — M6281 Muscle weakness (generalized): Secondary | ICD-10-CM | POA: Diagnosis not present

## 2017-01-06 DIAGNOSIS — K5900 Constipation, unspecified: Secondary | ICD-10-CM | POA: Diagnosis not present

## 2017-01-06 DIAGNOSIS — Z9049 Acquired absence of other specified parts of digestive tract: Secondary | ICD-10-CM

## 2017-01-06 DIAGNOSIS — I5033 Acute on chronic diastolic (congestive) heart failure: Secondary | ICD-10-CM | POA: Diagnosis present

## 2017-01-06 DIAGNOSIS — Z9181 History of falling: Secondary | ICD-10-CM

## 2017-01-06 DIAGNOSIS — I77811 Abdominal aortic ectasia: Secondary | ICD-10-CM | POA: Diagnosis present

## 2017-01-06 DIAGNOSIS — F329 Major depressive disorder, single episode, unspecified: Secondary | ICD-10-CM | POA: Diagnosis present

## 2017-01-06 DIAGNOSIS — G4700 Insomnia, unspecified: Secondary | ICD-10-CM | POA: Diagnosis present

## 2017-01-06 DIAGNOSIS — K529 Noninfective gastroenteritis and colitis, unspecified: Secondary | ICD-10-CM | POA: Diagnosis present

## 2017-01-06 DIAGNOSIS — I2723 Pulmonary hypertension due to lung diseases and hypoxia: Secondary | ICD-10-CM | POA: Diagnosis present

## 2017-01-06 DIAGNOSIS — N179 Acute kidney failure, unspecified: Secondary | ICD-10-CM | POA: Diagnosis not present

## 2017-01-06 DIAGNOSIS — R4182 Altered mental status, unspecified: Secondary | ICD-10-CM

## 2017-01-06 DIAGNOSIS — Z515 Encounter for palliative care: Secondary | ICD-10-CM | POA: Diagnosis not present

## 2017-01-06 DIAGNOSIS — E039 Hypothyroidism, unspecified: Secondary | ICD-10-CM | POA: Diagnosis present

## 2017-01-06 DIAGNOSIS — R001 Bradycardia, unspecified: Secondary | ICD-10-CM | POA: Diagnosis not present

## 2017-01-06 DIAGNOSIS — I5082 Biventricular heart failure: Secondary | ICD-10-CM | POA: Diagnosis present

## 2017-01-06 DIAGNOSIS — J9612 Chronic respiratory failure with hypercapnia: Secondary | ICD-10-CM | POA: Diagnosis present

## 2017-01-06 DIAGNOSIS — Z66 Do not resuscitate: Secondary | ICD-10-CM | POA: Diagnosis not present

## 2017-01-06 DIAGNOSIS — R740 Nonspecific elevation of levels of transaminase and lactic acid dehydrogenase [LDH]: Secondary | ICD-10-CM

## 2017-01-06 DIAGNOSIS — I248 Other forms of acute ischemic heart disease: Secondary | ICD-10-CM | POA: Diagnosis not present

## 2017-01-06 DIAGNOSIS — R197 Diarrhea, unspecified: Secondary | ICD-10-CM | POA: Diagnosis present

## 2017-01-06 DIAGNOSIS — R011 Cardiac murmur, unspecified: Secondary | ICD-10-CM | POA: Diagnosis present

## 2017-01-06 DIAGNOSIS — I270 Primary pulmonary hypertension: Secondary | ICD-10-CM | POA: Diagnosis not present

## 2017-01-06 DIAGNOSIS — I2721 Secondary pulmonary arterial hypertension: Secondary | ICD-10-CM | POA: Diagnosis not present

## 2017-01-06 DIAGNOSIS — Z91048 Other nonmedicinal substance allergy status: Secondary | ICD-10-CM

## 2017-01-06 DIAGNOSIS — Z87891 Personal history of nicotine dependence: Secondary | ICD-10-CM

## 2017-01-06 DIAGNOSIS — I472 Ventricular tachycardia: Secondary | ICD-10-CM | POA: Diagnosis not present

## 2017-01-06 DIAGNOSIS — D509 Iron deficiency anemia, unspecified: Secondary | ICD-10-CM | POA: Diagnosis present

## 2017-01-06 DIAGNOSIS — E872 Acidosis: Secondary | ICD-10-CM | POA: Diagnosis present

## 2017-01-06 DIAGNOSIS — Z6839 Body mass index (BMI) 39.0-39.9, adult: Secondary | ICD-10-CM

## 2017-01-06 DIAGNOSIS — E440 Moderate protein-calorie malnutrition: Secondary | ICD-10-CM | POA: Diagnosis not present

## 2017-01-06 DIAGNOSIS — I130 Hypertensive heart and chronic kidney disease with heart failure and stage 1 through stage 4 chronic kidney disease, or unspecified chronic kidney disease: Principal | ICD-10-CM | POA: Diagnosis present

## 2017-01-06 DIAGNOSIS — I5032 Chronic diastolic (congestive) heart failure: Secondary | ICD-10-CM | POA: Diagnosis present

## 2017-01-06 DIAGNOSIS — I2729 Other secondary pulmonary hypertension: Secondary | ICD-10-CM | POA: Diagnosis present

## 2017-01-06 DIAGNOSIS — I272 Pulmonary hypertension, unspecified: Secondary | ICD-10-CM | POA: Diagnosis not present

## 2017-01-06 DIAGNOSIS — Z96652 Presence of left artificial knee joint: Secondary | ICD-10-CM | POA: Diagnosis present

## 2017-01-06 DIAGNOSIS — J449 Chronic obstructive pulmonary disease, unspecified: Secondary | ICD-10-CM | POA: Diagnosis present

## 2017-01-06 DIAGNOSIS — R7401 Elevation of levels of liver transaminase levels: Secondary | ICD-10-CM

## 2017-01-06 DIAGNOSIS — R41841 Cognitive communication deficit: Secondary | ICD-10-CM | POA: Diagnosis not present

## 2017-01-06 DIAGNOSIS — R638 Other symptoms and signs concerning food and fluid intake: Secondary | ICD-10-CM | POA: Diagnosis present

## 2017-01-06 DIAGNOSIS — I517 Cardiomegaly: Secondary | ICD-10-CM | POA: Diagnosis not present

## 2017-01-06 DIAGNOSIS — J910 Malignant pleural effusion: Secondary | ICD-10-CM | POA: Diagnosis not present

## 2017-01-06 DIAGNOSIS — E860 Dehydration: Secondary | ICD-10-CM | POA: Diagnosis present

## 2017-01-06 DIAGNOSIS — R401 Stupor: Secondary | ICD-10-CM | POA: Diagnosis not present

## 2017-01-06 DIAGNOSIS — R05 Cough: Secondary | ICD-10-CM | POA: Diagnosis not present

## 2017-01-06 DIAGNOSIS — J810 Acute pulmonary edema: Secondary | ICD-10-CM | POA: Diagnosis not present

## 2017-01-06 DIAGNOSIS — R079 Chest pain, unspecified: Secondary | ICD-10-CM | POA: Diagnosis not present

## 2017-01-06 DIAGNOSIS — Z7189 Other specified counseling: Secondary | ICD-10-CM

## 2017-01-06 DIAGNOSIS — E669 Obesity, unspecified: Secondary | ICD-10-CM | POA: Diagnosis present

## 2017-01-06 DIAGNOSIS — J309 Allergic rhinitis, unspecified: Secondary | ICD-10-CM | POA: Diagnosis present

## 2017-01-06 DIAGNOSIS — I509 Heart failure, unspecified: Secondary | ICD-10-CM | POA: Diagnosis not present

## 2017-01-06 DIAGNOSIS — Z8614 Personal history of Methicillin resistant Staphylococcus aureus infection: Secondary | ICD-10-CM

## 2017-01-06 DIAGNOSIS — Z7902 Long term (current) use of antithrombotics/antiplatelets: Secondary | ICD-10-CM

## 2017-01-06 DIAGNOSIS — Z7982 Long term (current) use of aspirin: Secondary | ICD-10-CM

## 2017-01-06 DIAGNOSIS — R404 Transient alteration of awareness: Secondary | ICD-10-CM | POA: Diagnosis not present

## 2017-01-06 DIAGNOSIS — G9340 Encephalopathy, unspecified: Secondary | ICD-10-CM | POA: Diagnosis not present

## 2017-01-06 DIAGNOSIS — R531 Weakness: Secondary | ICD-10-CM | POA: Diagnosis not present

## 2017-01-06 DIAGNOSIS — R262 Difficulty in walking, not elsewhere classified: Secondary | ICD-10-CM | POA: Diagnosis present

## 2017-01-06 DIAGNOSIS — I50810 Right heart failure, unspecified: Secondary | ICD-10-CM

## 2017-01-06 DIAGNOSIS — I447 Left bundle-branch block, unspecified: Secondary | ICD-10-CM | POA: Diagnosis present

## 2017-01-06 DIAGNOSIS — Z841 Family history of disorders of kidney and ureter: Secondary | ICD-10-CM

## 2017-01-06 DIAGNOSIS — G2581 Restless legs syndrome: Secondary | ICD-10-CM | POA: Diagnosis present

## 2017-01-06 DIAGNOSIS — R0600 Dyspnea, unspecified: Secondary | ICD-10-CM | POA: Diagnosis not present

## 2017-01-06 DIAGNOSIS — I700 Atherosclerosis of aorta: Secondary | ICD-10-CM | POA: Diagnosis present

## 2017-01-06 LAB — CBC
HEMATOCRIT: 33.1 % — AB (ref 36.0–46.0)
HEMOGLOBIN: 8.9 g/dL — AB (ref 12.0–15.0)
MCH: 20.6 pg — ABNORMAL LOW (ref 26.0–34.0)
MCHC: 26.9 g/dL — AB (ref 30.0–36.0)
MCV: 76.4 fL — ABNORMAL LOW (ref 78.0–100.0)
Platelets: 128 10*3/uL — ABNORMAL LOW (ref 150–400)
RBC: 4.33 MIL/uL (ref 3.87–5.11)
RDW: 18.6 % — ABNORMAL HIGH (ref 11.5–15.5)
WBC: 5.7 10*3/uL (ref 4.0–10.5)

## 2017-01-06 LAB — URINALYSIS, ROUTINE W REFLEX MICROSCOPIC
Bacteria, UA: NONE SEEN
Bilirubin Urine: NEGATIVE
GLUCOSE, UA: NEGATIVE mg/dL
HGB URINE DIPSTICK: NEGATIVE
KETONES UR: 5 mg/dL — AB
NITRITE: NEGATIVE
PROTEIN: 30 mg/dL — AB
Specific Gravity, Urine: 1.016 (ref 1.005–1.030)
pH: 5 (ref 5.0–8.0)

## 2017-01-06 LAB — COMPREHENSIVE METABOLIC PANEL
ALBUMIN: 3.9 g/dL (ref 3.5–5.0)
ALT: 79 U/L — ABNORMAL HIGH (ref 14–54)
ANION GAP: 12 (ref 5–15)
AST: 122 U/L — ABNORMAL HIGH (ref 15–41)
Alkaline Phosphatase: 168 U/L — ABNORMAL HIGH (ref 38–126)
BUN: 55 mg/dL — ABNORMAL HIGH (ref 6–20)
CO2: 30 mmol/L (ref 22–32)
Calcium: 9.3 mg/dL (ref 8.9–10.3)
Chloride: 96 mmol/L — ABNORMAL LOW (ref 101–111)
Creatinine, Ser: 3.49 mg/dL — ABNORMAL HIGH (ref 0.44–1.00)
GFR calc Af Amer: 13 mL/min — ABNORMAL LOW (ref >60)
GFR calc non Af Amer: 12 mL/min — ABNORMAL LOW (ref >60)
GLUCOSE: 87 mg/dL (ref 65–99)
POTASSIUM: 4.7 mmol/L (ref 3.5–5.1)
Sodium: 138 mmol/L (ref 135–145)
TOTAL PROTEIN: 6.3 g/dL — AB (ref 6.5–8.1)
Total Bilirubin: 1.7 mg/dL — ABNORMAL HIGH (ref 0.3–1.2)

## 2017-01-06 LAB — C DIFFICILE QUICK SCREEN W PCR REFLEX
C Diff antigen: NEGATIVE
C Diff interpretation: NOT DETECTED
C Diff toxin: NEGATIVE

## 2017-01-06 LAB — BRAIN NATRIURETIC PEPTIDE: B Natriuretic Peptide: >4500 pg/mL — ABNORMAL HIGH (ref 0.0–100.0)

## 2017-01-06 LAB — TROPONIN I
Troponin I: 0.12 ng/mL (ref <0.03)
Troponin I: 0.1 ng/mL (ref <0.03)

## 2017-01-06 LAB — I-STAT CG4 LACTIC ACID, ED
Lactic Acid, Venous: 1.99 mmol/L (ref 0.5–1.9)
Lactic Acid, Venous: 1.29 mmol/L (ref 0.5–1.9)

## 2017-01-06 LAB — LIPASE, BLOOD: Lipase: 27 U/L (ref 11–51)

## 2017-01-06 LAB — MRSA PCR SCREENING: MRSA BY PCR: POSITIVE — AB

## 2017-01-06 MED ORDER — SODIUM BICARBONATE 650 MG PO TABS
650.0000 mg | ORAL_TABLET | Freq: Three times a day (TID) | ORAL | Status: DC
  Administered 2017-01-06 – 2017-01-17 (×32): 650 mg via ORAL
  Filled 2017-01-06 (×34): qty 1

## 2017-01-06 MED ORDER — SODIUM CHLORIDE 0.9 % IV SOLN: INTRAVENOUS | Status: DC

## 2017-01-06 MED ORDER — HYDROCODONE-ACETAMINOPHEN 10-325 MG PO TABS
1.0000 | ORAL_TABLET | ORAL | Status: DC | PRN
  Administered 2017-01-06 – 2017-01-17 (×20): 1 via ORAL
  Filled 2017-01-06 (×20): qty 1

## 2017-01-06 MED ORDER — LEVOTHYROXINE SODIUM 75 MCG PO TABS
75.0000 ug | ORAL_TABLET | Freq: Every day | ORAL | Status: DC
  Administered 2017-01-07 – 2017-01-17 (×11): 75 ug via ORAL
  Filled 2017-01-06 (×11): qty 1

## 2017-01-06 MED ORDER — ASPIRIN EC 81 MG PO TBEC
81.0000 mg | DELAYED_RELEASE_TABLET | Freq: Every day | ORAL | Status: DC
  Administered 2017-01-07 – 2017-01-17 (×11): 81 mg via ORAL
  Filled 2017-01-06 (×11): qty 1

## 2017-01-06 MED ORDER — ONDANSETRON HCL 4 MG/2ML IJ SOLN
4.0000 mg | Freq: Once | INTRAMUSCULAR | Status: AC
  Administered 2017-01-06: 4 mg via INTRAVENOUS
  Filled 2017-01-06: qty 2

## 2017-01-06 MED ORDER — SODIUM CHLORIDE 0.9% FLUSH
3.0000 mL | INTRAVENOUS | Status: DC | PRN
  Administered 2017-01-14: 3 mL via INTRAVENOUS
  Filled 2017-01-06: qty 3

## 2017-01-06 MED ORDER — SODIUM CHLORIDE 0.9 % IV BOLUS (SEPSIS)
1000.0000 mL | Freq: Once | INTRAVENOUS | Status: AC
  Administered 2017-01-06: 1000 mL via INTRAVENOUS

## 2017-01-06 MED ORDER — ONDANSETRON HCL 4 MG PO TABS
4.0000 mg | ORAL_TABLET | Freq: Four times a day (QID) | ORAL | Status: DC | PRN
  Administered 2017-01-07: 4 mg via ORAL
  Filled 2017-01-06: qty 1

## 2017-01-06 MED ORDER — TRAZODONE HCL 50 MG PO TABS
100.0000 mg | ORAL_TABLET | Freq: Every evening | ORAL | Status: DC | PRN
  Administered 2017-01-07 – 2017-01-09 (×2): 100 mg via ORAL
  Filled 2017-01-06 (×2): qty 1

## 2017-01-06 MED ORDER — SODIUM CHLORIDE 0.9 % IV SOLN: 250.0000 mL | INTRAVENOUS | Status: DC | PRN

## 2017-01-06 MED ORDER — ACETAMINOPHEN 325 MG PO TABS
650.0000 mg | ORAL_TABLET | Freq: Four times a day (QID) | ORAL | Status: DC | PRN
  Administered 2017-01-07 – 2017-01-13 (×6): 650 mg via ORAL
  Filled 2017-01-06 (×7): qty 2

## 2017-01-06 MED ORDER — ACETAMINOPHEN 650 MG RE SUPP: 650.0000 mg | Freq: Four times a day (QID) | RECTAL | Status: DC | PRN

## 2017-01-06 MED ORDER — METOPROLOL TARTRATE 100 MG PO TABS
100.0000 mg | ORAL_TABLET | Freq: Two times a day (BID) | ORAL | Status: DC
  Administered 2017-01-07 – 2017-01-09 (×5): 100 mg via ORAL
  Filled 2017-01-06 (×5): qty 1

## 2017-01-06 MED ORDER — ONDANSETRON HCL 4 MG/2ML IJ SOLN
4.0000 mg | Freq: Four times a day (QID) | INTRAMUSCULAR | Status: DC | PRN
Start: 2017-01-06 — End: 2017-01-17
  Administered 2017-01-07 – 2017-01-10 (×3): 4 mg via INTRAVENOUS
  Filled 2017-01-06 (×4): qty 2

## 2017-01-06 MED ORDER — ISOSORBIDE MONONITRATE ER 30 MG PO TB24
30.0000 mg | ORAL_TABLET | Freq: Every day | ORAL | Status: DC
  Administered 2017-01-06 – 2017-01-17 (×12): 30 mg via ORAL
  Filled 2017-01-06 (×12): qty 1

## 2017-01-06 MED ORDER — DOCUSATE SODIUM 100 MG PO CAPS
100.0000 mg | ORAL_CAPSULE | Freq: Two times a day (BID) | ORAL | Status: DC
  Administered 2017-01-07 – 2017-01-17 (×18): 100 mg via ORAL
  Filled 2017-01-06 (×20): qty 1

## 2017-01-06 MED ORDER — CLOPIDOGREL BISULFATE 75 MG PO TABS
75.0000 mg | ORAL_TABLET | Freq: Every morning | ORAL | Status: DC
  Administered 2017-01-07 – 2017-01-17 (×11): 75 mg via ORAL
  Filled 2017-01-06 (×11): qty 1

## 2017-01-06 MED ORDER — HEPARIN SODIUM (PORCINE) 5000 UNIT/ML IJ SOLN
5000.0000 [IU] | Freq: Three times a day (TID) | INTRAMUSCULAR | Status: DC
  Administered 2017-01-06 – 2017-01-17 (×33): 5000 [IU] via SUBCUTANEOUS
  Filled 2017-01-06 (×31): qty 1

## 2017-01-06 MED ORDER — HYDROCODONE-ACETAMINOPHEN 10-325 MG PO TABS: 1.0000 | ORAL_TABLET | ORAL | Status: DC | PRN

## 2017-01-06 MED ORDER — PANTOPRAZOLE SODIUM 40 MG PO TBEC
40.0000 mg | DELAYED_RELEASE_TABLET | Freq: Every day | ORAL | Status: DC
Start: 2017-01-06 — End: 2017-01-17
  Administered 2017-01-06 – 2017-01-17 (×12): 40 mg via ORAL
  Filled 2017-01-06 (×14): qty 1

## 2017-01-06 MED ORDER — METOPROLOL TARTRATE 100 MG PO TABS: 100.0000 mg | ORAL_TABLET | Freq: Two times a day (BID) | ORAL | Status: DC

## 2017-01-06 MED ORDER — SERTRALINE HCL 50 MG PO TABS
50.0000 mg | ORAL_TABLET | Freq: Every day | ORAL | Status: DC
  Administered 2017-01-06 – 2017-01-16 (×11): 50 mg via ORAL
  Filled 2017-01-06 (×11): qty 1

## 2017-01-06 MED ORDER — SIMVASTATIN 20 MG PO TABS
20.0000 mg | ORAL_TABLET | Freq: Every day | ORAL | Status: DC
  Administered 2017-01-06 – 2017-01-16 (×10): 20 mg via ORAL
  Filled 2017-01-06 (×11): qty 1

## 2017-01-06 MED ORDER — SODIUM CHLORIDE 0.9% FLUSH
3.0000 mL | Freq: Two times a day (BID) | INTRAVENOUS | Status: DC
  Administered 2017-01-06 – 2017-01-16 (×16): 3 mL via INTRAVENOUS

## 2017-01-06 MED ORDER — FENTANYL CITRATE (PF) 100 MCG/2ML IJ SOLN
100.0000 ug | INTRAMUSCULAR | Status: DC | PRN
Start: 2017-01-06 — End: 2017-01-15
  Administered 2017-01-06 (×2): 100 ug via INTRAVENOUS
  Filled 2017-01-06 (×2): qty 2

## 2017-01-06 MED ORDER — FLUTICASONE FUROATE-VILANTEROL 200-25 MCG/INH IN AEPB
1.0000 | INHALATION_SPRAY | Freq: Every day | RESPIRATORY_TRACT | Status: DC
  Administered 2017-01-08 – 2017-01-17 (×10): 1 via RESPIRATORY_TRACT
  Filled 2017-01-06 (×2): qty 28

## 2017-01-06 NOTE — Progress Notes (Signed)
CALL PAGER [phone] for any questions or notifications regarding this patient  FMTS Attending Note: Dorcas Mcmurray MD I have discussed this patient with Drs. Reesa Chew and [place], reviewed chart, agree with plan. I think we need repeat CT scan but given her elevated creatinine we will hydrate and see if we can get that down as I would like a contrast CT. Differential is quite broad as outlined in the resident's note. I will examine her on my morning rounds unless conditions change before then.

## 2017-01-06 NOTE — ED Provider Notes (Signed)
[facility] DEPT Provider Note   CSN: [id number] Arrival date & time: 01/06/17  8601     History   Chief Complaint Chief Complaint  Patient presents with  . Weakness  . Nausea    HPI CENA BRUHN is a 81 y.o. female.  She presents for evaluation of abdominal discomfort associated with frequent diarrhea, for several weeks, despite being evaluated and treated for same.  She states she has not been able to eat anything "for 2 days".  She denies fever, chills, cough, chest pain or back pain.  She is trying to use her usual medications.  No known sick contacts or abnormal food ingestions.  There are no other known modifying factors.  HPI  Past Medical History:  Diagnosis Date  . Arthritis   . Chest pain   . CHF (congestive heart failure) ([place])   . Chronic kidney disease   . Diastolic dysfunction   . Dizziness   . Head ache   . Heart murmur   . High blood pressure   . History of MRSA infection   . Renal insufficiency   . Shortness of breath     Patient Active Problem List   Diagnosis Date Noted  . Hypoxia 07/01/2016  . Acute encephalopathy 07/01/2016  . Renal failure (ARF), acute on chronic (HCC) 07/01/2016  . Acute respiratory failure with hypoxia ([place]) 07/01/2016  . S/P laparoscopic cholecystectomy 12/01/2015  . Umbilical hernia without obstruction and without gangrene   . COPD (chronic obstructive pulmonary disease) ([place]) 10/22/2015  . Peripheral vascular disease ([place]) 10/15/2015  . Difficulty in walking 10/15/2015  . Airway hyperreactivity 10/15/2015  . Gallstone pancreatitis   . MRSA cellulitis 06/29/2015  . SOB (shortness of breath)   . LBBB (left bundle branch block)   . Angina pectoris ([place]) 05/14/2015  . Leg swelling 05/14/2015  . Allergic rhinitis 04/17/2015  . Anxiety 04/17/2015  . Arthralgia of hand 04/17/2015  . Chronic kidney disease (CKD), stage III (moderate) 04/17/2015  . Fatigue 04/17/2015  . Numbness and tingling 04/17/2015  .  Nonspecific abnormal finding in stool contents 04/17/2015  . Restless leg 04/17/2015  . Hypothyroidism 07/09/2014  . Abnormal mammogram 11/01/2012  . Carotid stenosis 03/28/2012  . Atrial tachycardia ([place]) 03/28/2012  . Hyperlipidemia 03/28/2012  . Carotid artery obstruction 03/28/2012  . Supraventricular tachycardia ([place]) 03/28/2012  . Hyperpotassemia 02/27/2010  . Heart murmur 03/10/2009  . Insomnia 03/05/2009  . Essential (primary) hypertension 01/29/2009  . Goiter 01/29/2009  . History of tobacco use 01/29/2009  . Spinal stenosis of lumbar region without neurogenic claudication 07/14/2003    Past Surgical History:  Procedure Laterality Date  . APPENDECTOMY  1950  . CARDIAC CATHETERIZATION N/A 06/12/2015   Procedure: Left Heart Cath and Coronary Angiography;  Surgeon: Minna Merritts, MD;  Location: [facility];  Service: Cardiovascular;  Laterality: N/A;  . CAROTID ENDARTERECTOMY Left Dr. Lucky Cowboy  . CHOLECYSTECTOMY N/A 12/01/2015   Procedure: LAPAROSCOPIC CHOLECYSTECTOMY WITH INTRAOPERATIVE CHOLANGIOGRAM;  Surgeon: Hubbard Robinson, MD;  Location: [facility];  Service: General;  Laterality: N/A;  . foot and ankle surgery    . FRACTURE SURGERY    . JOINT REPLACEMENT    . knee replacement (other) Left 2002    OB History    Gravida Para Term Preterm AB Living   5 4           SAB TAB Ectopic Multiple Live Births  Home Medications    Prior to Admission medications   Medication Sig Start Date End Date Taking? Authorizing Provider  ADVAIR DISKUS 250-50 MCG/DOSE AEPB INHALE ONE (1) PUFF EVERY 12 HOURS AS DIRECTED. RINSE MOUTH AFTER EACH USE. 08/11/16  Yes Birdie Sons, MD  albuterol (PROVENTIL HFA;VENTOLIN HFA) 108 (90 Base) MCG/ACT inhaler Inhale 1 puff into the lungs every 6 (six) hours as needed for wheezing or shortness of breath.   Yes Historical Provider, MD  aspirin 81 MG tablet Take 81 mg by mouth every morning. Reported on 01/07/2016   Yes  Historical Provider, MD  Cholecalciferol (VITAMIN D-3) 5000 units TABS Take 1 tablet by mouth daily.   Yes Historical Provider, MD  clopidogrel (PLAVIX) 75 MG tablet TAKE ONE TABLET BY MOUTH EVERY MORNING 12/16/16  Yes Birdie Sons, MD  fluticasone [facility]) 50 MCG/ACT nasal spray INHALE ONE TO TWO SPRAY IN EACH NOSTRIL DAILY AS NEEDED 12/19/16  Yes Birdie Sons, MD  HYDROcodone-acetaminophen (NORCO) 10-325 MG tablet Take 1 tablet by mouth every 4 (four) hours as needed for moderate pain or severe pain. 01/05/17  Yes Birdie Sons, MD  isosorbide mononitrate (IMDUR) 30 MG 24 hr tablet TAKE 1 TABLET BY MOUTH DAILY FOR HEART 12/16/16  Yes Minna Merritts, MD  levothyroxine (SYNTHROID, LEVOTHROID) 75 MCG tablet TAKE 1 TABLET BY MOUTH DAILY (FOR THYROID) Patient taking differently: Take 75 mcg by mouth daily 04/13/16  Yes Birdie Sons, MD  meloxicam (MOBIC) 15 MG tablet TAKE ONE (1) TABLET BY MOUTH DAILY AS NEEDED FOR ARTHRITIS PAIN. 12/19/16  Yes Birdie Sons, MD  metoprolol (LOPRESSOR) 100 MG tablet Take 1 tablet (100 mg total) by mouth 2 (two) times daily. 03/08/16  Yes Minna Merritts, MD  nitroGLYCERIN (NITROSTAT) 0.4 MG SL tablet Place 1 tablet (0.4 mg total) under the tongue every 5 (five) minutes as needed for chest pain. 05/14/15  Yes Minna Merritts, MD  ondansetron (ZOFRAN ODT) 4 MG disintegrating tablet Take 1 tablet (4 mg total) by mouth every 8 (eight) hours as needed for nausea or vomiting. 12/11/16  Yes Carrie Mew, MD  pantoprazole (PROTONIX) 40 MG tablet Take 1 tablet (40 mg total) by mouth daily. 12/27/16  Yes Birdie Sons, MD  sertraline (ZOLOFT) 50 MG tablet Take 25 mg by mouth daily for mood Patient taking differently: Take 50 mg by mouth at bedtime.  07/05/16  Yes Modena Jansky, MD  simvastatin (ZOCOR) 20 MG tablet Take 20 mg by mouth daily 01/03/17  Yes Minna Merritts, MD  sodium bicarbonate 650 MG tablet Take 650 mg by mouth 3 (three) times daily.   Yes Historical  Provider, MD  traZODone (DESYREL) 100 MG tablet TAKE ONE-HALF OR ONE TABLET BY MOUTH AT BEDTIME AS NEEDED FOR SLEEP. 12/16/16  Yes Birdie Sons, MD  albuterol (PROVENTIL) (2.5 MG/3ML) 0.083% nebulizer solution Take 3 mLs (2.5 mg total) by nebulization every 4 (four) hours as needed for wheezing or shortness of breath. Patient not taking: Reported on 01/06/2017 07/05/16   Modena Jansky, MD  amLODipine (NORVASC) 10 MG tablet Take 1 tablet (10 mg total) by mouth daily. Patient not taking: Reported on 01/06/2017 07/06/16   Modena Jansky, MD  LORazepam (ATIVAN) 0.5 MG tablet Take 0.5 tablets (0.25 mg total) by mouth every 12 (twelve) hours as needed for anxiety. Patient not taking: Reported on 01/06/2017 07/05/16   Modena Jansky, MD  mupirocin ointment (BACTROBAN) 2 % APPLY TO AFFECTED  AREA(S) TWO TIMES PER DAY AS NEEDED Patient not taking: Reported on 01/06/2017 08/16/16   Birdie Sons, MD    Family History Family History  Problem Relation Age of Onset  . Congestive Heart Failure Mother 69  . Cancer Brother   . Kidney failure Father     Social History Social History  Substance Use Topics  . Smoking status: Former Smoker    Packs/day: 1.00    Years: 26.00    Types: Cigarettes    Quit date: 10/11/2011  . Smokeless tobacco: Never Used     Comment: Patient has smoked for 26 years; has quit but now started back 0.25 ppw.  . Alcohol use No     Allergies   Atorvastatin; Crestor [rosuvastatin calcium]; Tape; and Pravastatin sodium   Review of Systems Review of Systems  All other systems reviewed and are negative.    Physical Exam Updated Vital Signs BP (!) 168/78   Pulse (!) 53   Temp 98.2 F (36.8 C) (Oral)   Resp 19   SpO2 99%   Physical Exam  Constitutional: She is oriented to person, place, and time. She appears well-developed and well-nourished. No distress.  HENT:  Head: Normocephalic and atraumatic.  Eyes: Conjunctivae and EOM are normal. Pupils are equal,  round, and reactive to light.  Neck: Normal range of motion and phonation normal. Neck supple.  Cardiovascular: Normal rate and regular rhythm.   Pulmonary/Chest: Effort normal and breath sounds normal. She exhibits no tenderness.  Abdominal: Soft. She exhibits no distension. There is tenderness (Diffuse, mild). There is no guarding.  Hyperactive bowel sounds  Musculoskeletal: Normal range of motion.  Neurological: She is alert and oriented to person, place, and time. She exhibits normal muscle tone.  Skin: Skin is warm and dry.  Psychiatric: She has a normal mood and affect. Her behavior is normal.  Nursing note and vitals reviewed.    ED Treatments / Results  Labs (all labs ordered are listed, but only abnormal results are displayed) Labs Reviewed  COMPREHENSIVE METABOLIC PANEL - Abnormal; Notable for the following:       Result Value   Chloride 96 (*)    BUN 55 (*)    Creatinine, Ser 3.49 (*)    Total Protein 6.3 (*)    AST 122 (*)    ALT 79 (*)    Alkaline Phosphatase 168 (*)    Total Bilirubin 1.7 (*)    GFR calc non Af Amer 12 (*)    GFR calc Af Amer 13 (*)    All other components within normal limits  CBC - Abnormal; Notable for the following:    Hemoglobin 8.9 (*)    HCT 33.1 (*)    MCV 76.4 (*)    MCH 20.6 (*)    MCHC 26.9 (*)    RDW 18.6 (*)    Platelets 128 (*)    All other components within normal limits  URINALYSIS, ROUTINE W REFLEX MICROSCOPIC - Abnormal; Notable for the following:    Ketones, ur 5 (*)    Protein, ur 30 (*)    Leukocytes, UA TRACE (*)    Squamous Epithelial / LPF 0-5 (*)    All other components within normal limits  I-STAT CG4 LACTIC ACID, ED - Abnormal; Notable for the following:    Lactic Acid, Venous 1.99 (*)    All other components within normal limits  C DIFFICILE QUICK SCREEN W PCR REFLEX  CULTURE, BLOOD (ROUTINE X 2)  CULTURE,  BLOOD (ROUTINE X 2)  URINE CULTURE  LIPASE, BLOOD  I-STAT CG4 LACTIC ACID, ED    BUN  Date  Value Ref Range Status  01/06/2017 55 (H) 6 - 20 mg/dL Final  01/03/2017 45 (H) 6 - 20 mg/dL Final  12/11/2016 14 6 - 20 mg/dL Final  07/03/2016 25 (H) 6 - 20 mg/dL Final  04/27/2015 30 (H) 8 - 27 mg/dL Final  12/12/2014 36 (A) 4 - 21 mg/dL Final  03/24/2014 19 (H) 7 - 18 mg/dL Final  03/21/2014 14 7 - 18 mg/dL Final  03/13/2014 32 (H) 7 - 18 mg/dL Final  02/18/2014 23 (H) 7 - 18 mg/dL Final   Creatinine  Date Value Ref Range Status  03/24/2014 1.17 0.60 - 1.30 mg/dL Final  03/21/2014 1.05 0.60 - 1.30 mg/dL Final  03/13/2014 1.38 (H) 0.60 - 1.30 mg/dL Final  02/18/2014 1.25 0.60 - 1.30 mg/dL Final   Creatinine, Ser  Date Value Ref Range Status  01/06/2017 3.49 (H) 0.44 - 1.00 mg/dL Final  01/03/2017 2.71 (H) 0.44 - 1.00 mg/dL Final  12/11/2016 0.97 0.44 - 1.00 mg/dL Final  07/03/2016 0.92 0.44 - 1.00 mg/dL Final      EKG  EKG Interpretation  Date/Time:  Friday January 06 2017 07:01:15 EDT Ventricular Rate:  53 PR Interval:    QRS Duration: 170 QT Interval:  496 QTC Calculation: 466 R Axis:   -72 Text Interpretation:  Sinus rhythm Probable left atrial enlargement Left bundle branch block Baseline wander in lead(s) V1 No significant change since last tracing Confirmed by Christy Gentles  MD, [place] ([id number]) on 01/06/2017 7:22:24 AM       Radiology Dg Chest Port 1 View  Result Date: 01/06/2017 CLINICAL DATA:  Weakness with nausea and vomiting EXAM: PORTABLE CHEST 1 VIEW COMPARISON:  July 02, 2016 FINDINGS: There is airspace consolidation with volume loss the right lower lobe. There is a right pleural effusion. Left lung is clear. Heart is enlarged with pulmonary vascularity within normal limits. No adenopathy. There is atherosclerotic calcification in the aorta. There is degenerative change in the shoulders. IMPRESSION: Airspace consolidation with volume loss in the right lower lobe. Suspect pneumonia. There is cardiomegaly with small right pleural effusion. No frank edema. Left  lung clear. There is aortic atherosclerosis. Electronically Signed   By: Lowella Grip III M.D.   On: 01/06/2017 08:32    Procedures Procedures (including critical care time)  Medications Ordered in ED Medications  fentaNYL (SUBLIMAZE) injection 100 mcg (100 mcg Intravenous Given 01/06/17 0904)  sodium chloride 0.9 % bolus 1,000 mL (0 mLs Intravenous Stopped 01/06/17 1031)    And  sodium chloride 0.9 % bolus 1,000 mL (0 mLs Intravenous Stopped 01/06/17 1200)    And  sodium chloride 0.9 % bolus 1,000 mL (1,000 mLs Intravenous New Bag/Given 01/06/17 1030)     Initial Impression / Assessment and Plan / ED Course  I have reviewed the triage vital signs and the nursing notes.  Pertinent labs & imaging results that were available during my care of the patient were reviewed by me and considered in my medical decision making (see chart for details).  Clinical Course as of Jan 06 1357  Fri Jan 06, 2017  1127 Abnormally high WBC, UA: 6-30 [EW]  1127 Abnormal Ketones, ur: (!) 5 [EW]  1127 Borderline high Lactic Acid, Venous: (!!) 1.99 [EW]  1127 Normal C Diff interpretation: No C. difficile detected. [EW]  1128 Low Chloride: (!) 96 [EW]  1128 [place]  I Creatinine: (!) 3.49 [EW]  1128 High BUN: (!) 55 [EW]  1128 High ALT: (!) 79 [EW]  1128 High Alkaline Phosphatase: (!) 168 [EW]  1128 High Total Bilirubin: (!) 1.7 [EW]    Clinical Course User Index [EW] Daleen Bo, MD   Medications  fentaNYL (SUBLIMAZE) injection 100 mcg (100 mcg Intravenous Given 01/06/17 0904)  sodium chloride 0.9 % bolus 1,000 mL (0 mLs Intravenous Stopped 01/06/17 1031)    And  sodium chloride 0.9 % bolus 1,000 mL (0 mLs Intravenous Stopped 01/06/17 1200)    And  sodium chloride 0.9 % bolus 1,000 mL (1,000 mLs Intravenous New Bag/Given 01/06/17 1030)    Patient Vitals for the past 24 hrs:  BP Temp Temp src Pulse Resp SpO2  01/06/17 1330 (!) 168/78 - - (!) 53 19 99 %  01/06/17 1300 (!) 165/71 - - (!) 57 (!)  25 99 %  01/06/17 1230 (!) 170/76 - - (!) 53 20 100 %  01/06/17 1200 (!) 155/75 - - (!) 53 (!) 24 100 %  01/06/17 1130 (!) 177/87 - - (!) 54 18 98 %  01/06/17 1100 (!) 167/94 - - (!) 52 20 99 %  01/06/17 1030 (!) 179/72 - - (!) 52 19 100 %  01/06/17 1000 (!) 179/77 - - (!) 54 19 99 %  01/06/17 0930 (!) 170/80 - - (!) 50 15 100 %  01/06/17 0900 (!) 181/74 - - 61 (!) 26 96 %  01/06/17 0701 (!) 173/66 98.2 F (36.8 C) Oral (!) 49 12 95 %  01/06/17 0657 - - - - - 99 %    1:38 PM Reevaluation with update and discussion. After initial assessment and treatment, an updated evaluation reveals she remains comfortable has no further complaints.  Findings discussed with patient and family members, all questions answered. Kentaro Alewine L   1:58 PM-Consult complete with Resident. Patient case explained and discussed. She agrees to admit patient for further evaluation and treatment. Call ended at 14:10   Final Clinical Impressions(s) / ED Diagnoses   Final diagnoses:  AKI (acute kidney injury) ([place])  Diarrhea, unspecified type  Transaminitis   Malaise with vomiting and diarrhea, source unclear.  Evaluation with elevated lactate, borderline high, and abnormal chest x-ray, without significant respiratory complaints.  Nonspecific transaminitis, with elevated creatinine.  Baseline creatinine, when seen in the ED, 12/11/16, 0.97.  Apparently symptoms continued since that time and now she has significant AKI.  The question of possible infection, with ongoing symptoms, which are most likely GI in etiology.  Repeat lactate is normal after IV fluid boluses.  Abnormal chest x-ray, is nonspecific for positive factors.  Initial screening of stool indicates C. difficile toxin negative.  Broad-based stool panel, pending.  Labs were drawn on 01/03/17 with elevated creatinine, but it does not appear that this was acted upon.  Patient will require admission. for further treatment.  Nursing Notes Reviewed/ Care  Coordinated Applicable Imaging Reviewed Interpretation of Laboratory Data incorporated into ED treatment   Plan: Admit  New Prescriptions New Prescriptions   No medications on file     Daleen Bo, MD 01/06/17 1557

## 2017-01-06 NOTE — ED Notes (Signed)
Attempted report will call back in five minutes

## 2017-01-06 NOTE — ED Triage Notes (Signed)
Pt reports malaise/weakness ongoing x2 weeks. States declined PO intake. N/V/D. Seen at [facility] for the same.

## 2017-01-06 NOTE — Progress Notes (Signed)
Stopped by patient's room to perform rectal and bimanual exam but patient fast asleep. Will re-attempt tomorrow, 3/31.

## 2017-01-06 NOTE — ED Notes (Signed)
Pt's rectal temp from earlier was 97.7, per Ubaldo Glassing, Therapist, sports.

## 2017-01-06 NOTE — H&P (Signed)
[facility] Admission History and Physical Service Pager: [phone]  Patient name: Carolyn Hendricks Medical record number: [id number] Date of birth: 1936-02-23 Age: 81 y.o. Gender: female  Primary Care Provider: [name], MD Consultants: None  Code Status: FULL   Chief Complaint:  Poor po intake   Assessment and Plan: Carolyn Hendricks is a 81 y.o. female presenting with decreased po intake and abdominal pain. PMH is significant for CAD, asthma,  HTN, hypothyroidism with goiter, CKDIII, DDD with spinal stenosis of lumbar region, CHF, HLD, anxiety, acute renal failure, obesity and history of tobacco use.    Abdominal pain/nausea/vomiting/decreased po intake: On arrival to ED, VSS and afebrile with normal white count of 5.7.  Complaining of generalized abdominal pain, nausea, vomiting and decreased po intake worse over last 3-4 days. However, has had poor PO and abdominal pain since beginning of March.  Also endorsing chest pain several days ago that sounded atypical in nature.  Differential is broad but infectious etiology seems less likely given lack of leukocytosis or fevers but did have inflammation of rectum on recent CT abdomen pelvis. Duration concerning for more chronic process like R heart failure or subacute pancreatitis. In ED given fentanyl 100 mcg x1, Zofran and fluids.  s/p IV 1L NS bolus x 3. Labs notable for elevated Cr to 3.49, elevated AST to 122, ALT 79 and elevated alk phos elevated to 168. Tbili also elevated to 1.7.  Differentials include pancreatitis vs hepatitis vs viral gastritis. With history of appendectomy when she was in her teens and cholecystectomy in Dec 2016.  ACS must also be ruled out.  Clinical picture fits pancreatitis but unlikely given normal lipase of 27.  Epigastric pain may also be related to recent vomiting. Was seen at [facility] earlier this month (12/11/2016) with similar symptoms and a CT A/P was done at that time which revealed  a small right pleural effusion, nodular contour of liver with concern for mild hepatic cirrhosis, trace ascites, scattered diverticulosis without evidence of diverticulitis, and soft tissue inflammation about the rectum. Consider possible ascites in setting of heart failure as ascites was noted on recent CT in 12/11/2016.  Initial troponin in ED of 0.12. Has had elevated troponins to 0.9 in the past per chart review.   LA elevated to 1.99 and improved to 1.29 with fluids.   EKG with SR and LBBB (similar to previous).  CXR with cardiomegaly with small right pleural effusion, no frank edema and airspace consolidation in RLL suspicious for PNA.  BNP elevated >4,500.  No crackles on lung exam and patient not short of breath.  Currently on her home 2L O2 and satting 100%.  Exhibiting some LE edema but with no other obvious signs of fluid overload.   -Admit to telemetry, attending Dr. Nori Riis  -Advance diet as tolerated to heart healthy  -Obtain ECHO (last performed in 2015) -Will hold off on imaging for now in setting of Cr elevated to 3.49.  Will obtain CT C/A/P once AKI resolving.  -follow up Bcx/Ucx  -HIV -Hepatitis panel -PT/OT consults placed  -FOBT -GI panel by stool -Enteric precautions -rectal exam to examine for proctitis -bimanual exam to examine for masses -Tylenol 650 mg Q6 prn for pain  -Zofran 4 mg Q6 prn for nausea  -I's and O's  -daily weights  -pulse ox checks with vitals  -vitals per unit routine   Diarrhea: Reports non-bloody diarrhea x several months.  C-diff negative.   -GI panel by stool  -  Enteric precautions -FOBT  -Rectal exam -Cautious to IV hydrate with very elevated BNP  Weakness: likely related to poor po intake although degenerative disc disease is probably contributing to history of falls.  Has had recent fall but did not hit head.  No LOC.   - PT/OT consulted   CAD: Reports chest pain several days ago; will r/o ACS although currently no chest pain.   At home on  ASA, statin and Plavix. Troponins have been elevated to 0.09 in the past. Will order serial trops and monitor.   -continue home aspirin, Zocor 20 mg and Plavix  -Trend troponins -AM EKG   Urinary symptoms: Reports increased frequency, urgency and dysuria.  UA with trace leuks.  Urine sent for culture.   -Will hold off on antibiotics for now given no leukocytosis and no fever.   -Follow up urine cx  -Gram stain ordered  -Continue to monitor   AKI:  Likely dehydration in setting of decreased po over last several days.  Cr on admission 3.49.  BL ~0.9.  With CKD III.  Caution with fluids in setting of possible heart failure.  Uses Mobic at home and GI note from Jan 2017 noting that she uses a lot of NSAIDs.  Had renal failure during hospital stay in Sept 2017.  -gentle IVFs in setting of possible heart failure  -AM BMET   CHF:   Follows with cardiology at [facility].  Last saw Cards on 01/03/2017 for bilateral carotid artery stenosis.  Echo in 2015 with EF 55-60% and some impaired diastolic filling.   Cath 06/2015 with 50% proximal LAD. Per cardiology note, takes Lasix but not on a regular basis, only about once a week for leg swelling.    Asthma, stable:  At home on Advair diskus.  Not on formulary but ordered as Breo Ellipta.  -Continue Breo Ellipta 1 puff daily   HTN:  BP on arrival 165/71.  At home on Imdur 30 mg daily, Lopressor 100 mg BID, and Norvasc 10 mg daily .   -Continue home Imdur and Lopressor.   -Hold Norvasc for now in setting of leg swelling  -Monitor blood pressures  Hypothyroidism, stable.  Last TSH 0.639 and wnl, on 07/01/2016.  At home on Synthroid.  -Continue home Synthroid 75 mcg   DDD with spinal stenosis of lumbar region, stable.  Complaining of back pain on admission but this is not new.  Denies acute symptoms.  At home on Norco 10-325 Q4 PRN.   -Continue home pain med  Anxiety/Depression:  At home on Zoloft.  Per daughter, used to take Ativan but no  longer is on it.  -Continue Zoloft 50 mg po at bedtime   CKD III:  Follows with [facility] home sodium bicarb 650 mg daily    Insomnia.  At home on Trazodone 100 mg prn at bedtime.  -Continue home Trazodone    FEN/GI: Heart healthy diet, KVO Prophylaxis: Heparin, Protonix   Disposition: Admit to med-surg for IV hydration and pain control, attending Dr. Nori Riis   History of Present Illness:  Carolyn Hendricks is a 81 y.o. female presenting with poor po intake that has been worsening x last 4 days.  Gets most of her care at [facility] and was seen about 1 month ago for a stomach virus.  Has not been eating and drinking over the past 2 days at all because she has been nauseous.  For this reason has also not  taken her medications for about 2 days.  Denies URI symptoms of cough, congestion, rhinorrhea.  Feels weak.  2-3 episodes of vomiting daily for last 2 days. Thinks may have had fever but is unsure and did not check temperatures at home.  Reports burning on urination, increased frequency and urgency.  Notes she has to go to the bathroom after any time she eats/drinks.  Also with diarrhea x several months.  Is not watery but just loose.  No blood noted.  Does not think she has been on antibiotics lately.  History of abdominal surgeries, has had a cholecystectomy and appendectomy in the past.  Reports chest pain a couple days ago.  No radiation and felt like pressure.  Recently saw her Cardiologist on 3/27.  Is not currently having chest pain.  On 2L at home.  As for her weakness, she fell a couple of nights ago.  Did not hit her head.  Uses rollator and cane to ambulate.  Was at [place] for 2 months.     Review Of Systems: Per HPI with the following additions:  Review of Systems  Constitutional: Negative for chills, fever and weight loss.  HENT: Negative for congestion and sore throat.   Respiratory: Positive for shortness of breath and wheezing. Negative for  cough.   Cardiovascular: Positive for leg swelling.  Gastrointestinal: Positive for abdominal pain, diarrhea, nausea and vomiting. Negative for blood in stool and constipation.  Genitourinary: Positive for dysuria, frequency and urgency.  Skin: Negative for rash.  Neurological: Positive for weakness.   Patient Active Problem List   Diagnosis Date Noted  . Decreased oral intake 01/06/2017  . Hypoxia 07/01/2016  . Acute encephalopathy 07/01/2016  . Renal failure (ARF), acute on chronic (HCC) 07/01/2016  . Acute respiratory failure with hypoxia ([place]) 07/01/2016  . S/P laparoscopic cholecystectomy 12/01/2015  . Umbilical hernia without obstruction and without gangrene   . COPD (chronic obstructive pulmonary disease) ([place]) 10/22/2015  . Peripheral vascular disease ([place]) 10/15/2015  . Difficulty in walking 10/15/2015  . Airway hyperreactivity 10/15/2015  . Gallstone pancreatitis   . MRSA cellulitis 06/29/2015  . SOB (shortness of breath)   . LBBB (left bundle branch block)   . Angina pectoris ([place]) 05/14/2015  . Leg swelling 05/14/2015  . Allergic rhinitis 04/17/2015  . Anxiety 04/17/2015  . Arthralgia of hand 04/17/2015  . Chronic kidney disease (CKD), stage III (moderate) 04/17/2015  . Fatigue 04/17/2015  . Numbness and tingling 04/17/2015  . Nonspecific abnormal finding in stool contents 04/17/2015  . Restless leg 04/17/2015  . Hypothyroidism 07/09/2014  . Abnormal mammogram 11/01/2012  . Carotid stenosis 03/28/2012  . Atrial tachycardia ([place]) 03/28/2012  . Hyperlipidemia 03/28/2012  . Carotid artery obstruction 03/28/2012  . Supraventricular tachycardia ([place]) 03/28/2012  . Hyperpotassemia 02/27/2010  . Heart murmur 03/10/2009  . Insomnia 03/05/2009  . Essential (primary) hypertension 01/29/2009  . Goiter 01/29/2009  . History of tobacco use 01/29/2009  . Spinal stenosis of lumbar region without neurogenic claudication 07/14/2003   Past Medical History: Past Medical  History:  Diagnosis Date  . Arthritis   . Chest pain   . CHF (congestive heart failure) ([place])   . Chronic kidney disease   . Diastolic dysfunction   . Dizziness   . Head ache   . Heart murmur   . High blood pressure   . History of MRSA infection   . Renal insufficiency   . Shortness of breath    Past Surgical History: Past  Surgical History:  Procedure Laterality Date  . APPENDECTOMY  1950  . CARDIAC CATHETERIZATION N/A 06/12/2015   Procedure: Left Heart Cath and Coronary Angiography;  Surgeon: Minna Merritts, MD;  Location: [facility];  Service: Cardiovascular;  Laterality: N/A;  . CAROTID ENDARTERECTOMY Left Dr. Lucky Cowboy  . CHOLECYSTECTOMY N/A 12/01/2015   Procedure: LAPAROSCOPIC CHOLECYSTECTOMY WITH INTRAOPERATIVE CHOLANGIOGRAM;  Surgeon: Hubbard Robinson, MD;  Location: [facility] ORS;  Service: General;  Laterality: N/A;  . foot and ankle surgery    . FRACTURE SURGERY    . JOINT REPLACEMENT    . knee replacement (other) Left 2002   Social History: Social History  Substance Use Topics  . Smoking status: Former Smoker    Packs/day: 1.00    Years: 26.00    Types: Cigarettes    Quit date: 10/11/2011  . Smokeless tobacco: Never Used     Comment: Patient has smoked for 26 years; has quit but now started back 0.25 ppw.  . Alcohol use No   Additional social history: Used to smoke. Does not drink EtOH. Please also refer to relevant sections of EMR.  Family History: Family History  Problem Relation Age of [place]  . Congestive Heart Failure Mother 46  . Cancer Brother   . Kidney failure Father    Allergies and Medications: Allergies  Allergen Reactions  . Atorvastatin Other (See Comments)    Other reaction(s): Muscle Pain  . Crestor [Rosuvastatin Calcium] Other (See Comments)    Other reaction(s): Muscle Pain  . Tape Other (See Comments)    Makes a bruise on my skin. Paper tape is ok to use.  . Pravastatin Sodium Other (See Comments)    Muscle pain   No current  facility-administered medications on file prior to encounter.    Current Outpatient Prescriptions on File Prior to Encounter  Medication Sig Dispense Refill  . ADVAIR DISKUS 250-50 MCG/DOSE AEPB INHALE ONE (1) PUFF EVERY 12 HOURS AS DIRECTED. RINSE MOUTH AFTER EACH USE. 60 each 5  . aspirin 81 MG tablet Take 81 mg by mouth every morning. Reported on 01/07/2016    . clopidogrel (PLAVIX) 75 MG tablet TAKE ONE TABLET BY MOUTH EVERY MORNING 30 tablet 3  . fluticasone (FLONASE) 50 MCG/ACT nasal spray INHALE ONE TO TWO SPRAY IN EACH NOSTRIL DAILY AS NEEDED 42 g 2  . HYDROcodone-acetaminophen (NORCO) 10-325 MG tablet Take 1 tablet by mouth every 4 (four) hours as needed for moderate pain or severe pain. 180 tablet 0  . isosorbide mononitrate (IMDUR) 30 MG 24 hr tablet TAKE 1 TABLET BY MOUTH DAILY FOR HEART 30 tablet 0  . levothyroxine (SYNTHROID, LEVOTHROID) 75 MCG tablet TAKE 1 TABLET BY MOUTH DAILY (FOR THYROID) (Patient taking differently: Take 75 mcg by mouth daily) 30 tablet 12  . meloxicam (MOBIC) 15 MG tablet TAKE ONE (1) TABLET BY MOUTH DAILY AS NEEDED FOR ARTHRITIS PAIN. 90 tablet 2  . metoprolol (LOPRESSOR) 100 MG tablet Take 1 tablet (100 mg total) by mouth 2 (two) times daily. 180 tablet 3  . nitroGLYCERIN (NITROSTAT) 0.4 MG SL tablet Place 1 tablet (0.4 mg total) under the tongue every 5 (five) minutes as needed for chest pain. 25 tablet 3  . ondansetron (ZOFRAN ODT) 4 MG disintegrating tablet Take 1 tablet (4 mg total) by mouth every 8 (eight) hours as needed for nausea or vomiting. 20 tablet 0  . pantoprazole (PROTONIX) 40 MG tablet Take 1 tablet (40 mg total) by mouth daily. 90 tablet 4  .  sertraline (ZOLOFT) 50 MG tablet Take 25 mg by mouth daily for mood (Patient taking differently: Take 50 mg by mouth at bedtime. ) 10 tablet 0  . simvastatin (ZOCOR) 20 MG tablet Take 20 mg by mouth daily 90 tablet 3  . sodium bicarbonate 650 MG tablet Take 650 mg by mouth 3 (three) times daily.    .  traZODone (DESYREL) 100 MG tablet TAKE ONE-HALF OR ONE TABLET BY MOUTH AT BEDTIME AS NEEDED FOR SLEEP. 30 tablet 3  . albuterol (PROVENTIL) (2.5 MG/3ML) 0.083% nebulizer solution Take 3 mLs (2.5 mg total) by nebulization every 4 (four) hours as needed for wheezing or shortness of breath. (Patient not taking: Reported on 01/06/2017)    . amLODipine (NORVASC) 10 MG tablet Take 1 tablet (10 mg total) by mouth daily. (Patient not taking: Reported on 01/06/2017)    . LORazepam (ATIVAN) 0.5 MG tablet Take 0.5 tablets (0.25 mg total) by mouth every 12 (twelve) hours as needed for anxiety. (Patient not taking: Reported on 01/06/2017) 6 tablet 0  . mupirocin ointment (BACTROBAN) 2 % APPLY TO AFFECTED AREA(S) TWO TIMES PER DAY AS NEEDED (Patient not taking: Reported on 01/06/2017) 22 g 5   Objective: BP (!) 183/74 (BP Location: Left Arm) Comment: RN aware  Pulse (!) 56   Temp 98 F (36.7 C) (Oral)   Resp 18   SpO2 100%  Exam: General: 81 yo female laying in hospital bed, appears comfortable, in NAD Eyes: EOMI, PERRL ENTM: mucous membranes dry, oropharynx clear  Neck: supple, no JVD  Cardiovascular: RRR, no MRG noted on exam, palpable pulses Respiratory: mild expiratory wheezing appreciated, decreased breath sounds over R lung field compared to left Gastrointestinal: soft, obese, TTP diffusely but especially over LLQ and epigastric, +BS MSK: no tenderness, 1+ pitting pedal edema and to knees bilaterally Derm: skin is warm and dry, no rashes, cap refill <2 seconds  Neuro: AAOx3, no focal deficits, CN II-XII intact, normal speech, 5/5 strength bilaterally in upper and LE Psych: normal mood and affect   Labs and Imaging: CBC BMET   Recent Labs Lab 01/06/17 0722  WBC 5.7  HGB 8.9*  HCT 33.1*  PLT 128*    Recent Labs Lab 01/06/17 0722  NA 138  K 4.7  CL 96*  CO2 30  BUN 55*  CREATININE 3.49*  GLUCOSE 87  CALCIUM 9.3     Dg Chest Port 1 View  Result Date: 01/06/2017 CLINICAL DATA:   Weakness with nausea and vomiting EXAM: PORTABLE CHEST 1 VIEW COMPARISON:  July 02, 2016 FINDINGS: There is airspace consolidation with volume loss the right lower lobe. There is a right pleural effusion. Left lung is clear. Heart is enlarged with pulmonary vascularity within normal limits. No adenopathy. There is atherosclerotic calcification in the aorta. There is degenerative change in the shoulders. IMPRESSION: Airspace consolidation with volume loss in the right lower lobe. Suspect pneumonia. There is cardiomegaly with small right pleural effusion. No frank edema. Left lung clear. There is aortic atherosclerosis. Electronically Signed   By: Lowella Grip III M.D.   On: 01/06/2017 08:32    Lovenia Kim, MD 01/06/2017, 7:37 PM PGY-1, [place] Intern pager: [phone], text pages welcome  Upper Level Addendum:  I have seen and evaluated this patient along with Dr. Reesa Chew and reviewed the above note, making necessary revisions in green.  Rogue Bussing, MD PGY-2,  [facility] 01/06/2017 11:56 PM

## 2017-01-07 DIAGNOSIS — R638 Other symptoms and signs concerning food and fluid intake: Secondary | ICD-10-CM

## 2017-01-07 LAB — CBC
HCT: 34.5 % — ABNORMAL LOW (ref 36.0–46.0)
Hemoglobin: 9.1 g/dL — ABNORMAL LOW (ref 12.0–15.0)
MCH: 20.8 pg — ABNORMAL LOW (ref 26.0–34.0)
MCHC: 26.4 g/dL — ABNORMAL LOW (ref 30.0–36.0)
MCV: 78.8 fL (ref 78.0–100.0)
PLATELETS: 120 10*3/uL — AB (ref 150–400)
RBC: 4.38 MIL/uL (ref 3.87–5.11)
RDW: 19.4 % — ABNORMAL HIGH (ref 11.5–15.5)
WBC: 5.9 10*3/uL (ref 4.0–10.5)

## 2017-01-07 LAB — URINE CULTURE

## 2017-01-07 LAB — GRAM STAIN

## 2017-01-07 LAB — BASIC METABOLIC PANEL
Anion gap: 13 (ref 5–15)
BUN: 46 mg/dL — ABNORMAL HIGH (ref 6–20)
CALCIUM: 9 mg/dL (ref 8.9–10.3)
CO2: 28 mmol/L (ref 22–32)
CREATININE: 2.34 mg/dL — AB (ref 0.44–1.00)
Chloride: 102 mmol/L (ref 101–111)
GFR, EST AFRICAN AMERICAN: 22 mL/min — AB (ref >60)
GFR, EST NON AFRICAN AMERICAN: 19 mL/min — AB (ref >60)
Glucose, Bld: 81 mg/dL (ref 65–99)
Potassium: 4.1 mmol/L (ref 3.5–5.1)
SODIUM: 143 mmol/L (ref 135–145)

## 2017-01-07 LAB — HIV ANTIBODY (ROUTINE TESTING W REFLEX): HIV SCREEN 4TH GENERATION: NONREACTIVE

## 2017-01-07 LAB — TROPONIN I: Troponin I: 0.09 ng/mL (ref <0.03)

## 2017-01-07 MED ORDER — MUPIROCIN 2 % EX OINT
1.0000 "application " | TOPICAL_OINTMENT | Freq: Two times a day (BID) | CUTANEOUS | Status: AC
  Administered 2017-01-07 – 2017-01-11 (×10): 1 via NASAL
  Filled 2017-01-07 (×2): qty 22

## 2017-01-07 MED ORDER — CHLORHEXIDINE GLUCONATE CLOTH 2 % EX PADS
6.0000 | MEDICATED_PAD | Freq: Every day | CUTANEOUS | Status: AC
  Administered 2017-01-07 – 2017-01-11 (×5): 6 via TOPICAL

## 2017-01-07 MED ORDER — SODIUM CHLORIDE 0.9 % IV SOLN
INTRAVENOUS | Status: AC
  Administered 2017-01-07 – 2017-01-08 (×2): via INTRAVENOUS

## 2017-01-07 NOTE — Progress Notes (Signed)
Pt has yeast in skin folds perineal area, is reddened, ordered Interdry Ag for areas.

## 2017-01-07 NOTE — Progress Notes (Signed)
Unable to get stool sample for it was mixed with urine this morning. Will endorse appropriately.

## 2017-01-07 NOTE — Evaluation (Signed)
Physical Therapy Evaluation Patient Details Name: Carolyn Hendricks MRN: [id number] DOB: April 18, 1936 Today's Date: 01/07/2017   History of Present Illness  Carolyn Hendricks a 81 y.o.femalepresenting with decreased po intake and abdominal pain. PMH is significant for CAD, asthma, HTN, hypothyroidism with goiter, CKDIII, DDD with spinal stenosis of lumbar region, CHF, HLD, anxiety, acute renal failure, obesity and history of tobacco use. Has had cholecystectomy and appendectomy.   Clinical Impression  Pt admitted with above diagnosis. Pt currently with functional limitations due to the deficits listed below (see PT Problem List). Pt was able to ambulate with RW with mod assist of 2.  Needs a lot of postural cues and pts endurance is poor.  Will benefit from SNF.  Will follow acutely.  Pt will benefit from skilled PT to increase their independence and safety with mobility to allow discharge to the venue listed below.      Follow Up Recommendations SNF;Supervision/Assistance - 24 hour (Wants to go to [facility] in [place])    Equipment Recommendations  Other (comment);Pressure relieving Wheelchair cushion; Needs a 3N1 (Lumex brand);lightweight w/c 18x16 with elevating legrests and anti tippers)    Recommendations for Other Services       Precautions / Restrictions Precautions Precautions: Fall Restrictions Weight Bearing Restrictions: No      Mobility  Bed Mobility Overal bed mobility: Needs Assistance Bed Mobility: Supine to Sit     Supine to sit: Mod assist;+2 for physical assistance     General bed mobility comments: assist for LEs and elevate trunk  Transfers Overall transfer level: Needs assistance Equipment used: Rolling walker (2 wheeled) Transfers: Sit to/from Stand Sit to Stand: Min assist;+2 physical assistance         General transfer comment: Pt needed cues for hand placement and slight assist to power up. Also steadying assist once up.  cues to stand tall.    Ambulation/Gait Ambulation/Gait assistance: Mod assist;+2 physical assistance Ambulation Distance (Feet): 15 Feet (3 feet and then 12 feet) Assistive device: Rolling walker (2 wheeled) Gait Pattern/deviations: Step-through pattern;Decreased stride length;Antalgic;Staggering left;Staggering right;Drifts right/left;Trunk flexed;Wide base of support   Gait velocity interpretation: Below normal speed for age/gender General Gait Details: Pt walked a few steps and then asked to sit on 3N1.  Pulled potty chair up to pt.  Pt had small BM.  Total assit to clean pt.  Pt then walked aruond bed to recliner but needed incr cues for posture, assist to move RW and once getting close to chair cues to back up to chair as pt wanting to flex and sit prior to getting to chair.  Lots of cues and assist for safety.   Stairs            Wheelchair Mobility    Modified Rankin (Stroke Patients Only)       Balance Overall balance assessment: Needs assistance;History of Falls Sitting-balance support: No upper extremity supported;Feet supported Sitting balance-Leahy Scale: Fair     Standing balance support: Bilateral upper extremity supported;During functional activity Standing balance-Leahy Scale: Poor Standing balance comment: relies heavily on UE support for balance with cues needed to stand up tall.              High level balance activites: Backward walking High Level Balance Comments: mod assist for backing up to assist with rW and cues for pt to stand tall.              Pertinent Vitals/Pain Pain Assessment: Faces Faces Pain Scale: Hurts little more  Pain Location: abdomen Pain Descriptors / Indicators: Aching;Grimacing;Guarding;Sore Pain Intervention(s): Limited activity within patient's tolerance;Monitored during session;Premedicated before session;Repositioned  O2 sats >90% on 2LO2 with activity  Home Living Family/patient expects to be discharged to:: Private residence Living  Arrangements: Children Available Help at Discharge: Family;Available PRN/intermittently Type of Home: House Home Access: Stairs to enter Entrance Stairs-Rails: None Entrance Stairs-Number of Steps: 2 Home Layout: One level Home Equipment: Walker - 4 wheels;Cane - single point;Walker - standard (shower chair is worn out per pt)      Prior Function Level of Independence: Independent with assistive device(s);Needs assistance   Gait / Transfers Assistance Needed: Ambulates with rollator  ADL's / Homemaking Assistance Needed: Daughter assists with meal prep, housekeeping        Hand Dominance   Dominant Hand: Right    Extremity/Trunk Assessment   Upper Extremity Assessment Upper Extremity Assessment: Defer to OT evaluation    Lower Extremity Assessment Lower Extremity Assessment: Generalized weakness    Cervical / Trunk Assessment Cervical / Trunk Assessment: Kyphotic  Communication   Communication: No difficulties  Cognition Arousal/Alertness: Awake/alert Behavior During Therapy: WFL for tasks assessed/performed Overall Cognitive Status: Within Functional Limits for tasks assessed                                        General Comments      Exercises     Assessment/Plan    PT Assessment Patient needs continued PT services  PT Problem List Decreased activity tolerance;Decreased balance;Decreased mobility;Decreased knowledge of use of DME;Decreased safety awareness;Decreased knowledge of precautions;Pain       PT Treatment Interventions DME instruction;Gait training;Functional mobility training;Therapeutic activities;Therapeutic exercise;Balance training;Patient/family education    PT Goals (Current goals can be found in the Care Plan section)  Acute Rehab PT Goals Patient Stated Goal: to get better PT Goal Formulation: With patient Time For Goal Achievement: 01/21/17 Potential to Achieve Goals: Good    Frequency Min 2X/week   Barriers to  discharge Decreased caregiver support      Co-evaluation               End of Session Equipment Utilized During Treatment: Gait belt;Oxygen Activity Tolerance: Patient limited by fatigue;Patient limited by pain (limited by nausea) Patient left: in chair;with call bell/phone within reach;with family/visitor present Nurse Communication: Mobility status;Other (comment) (nausea med and headache med) PT Visit Diagnosis: Unsteadiness on feet (R26.81);Muscle weakness (generalized) (M62.81)    Time: 3244-0102 PT Time Calculation (min) (ACUTE ONLY): 34 min   Charges:   PT Evaluation $PT Eval Moderate Complexity: 1 Procedure PT Treatments $Gait Training: 8-22 mins   PT G Codes:       Samual Beals,PT Acute Rehabilitation [phone] (pager)  Berline Lopes 01/07/2017, 1:57 PM

## 2017-01-07 NOTE — Progress Notes (Signed)
Family Medicine Teaching Service Daily Progress Note Intern Pager: [phone]  Patient name: Carolyn Hendricks Medical record number: [id number] Date of birth: 04-28-1936 Age: 81 y.o. Gender: female  Primary Care Provider: Mila Merry, MD Consultants: None Code Status: Full   Pt Overview and Major Events to Date:  3/30 - Admitted to FPTS for inability to tolerate PO  Carolyn Hendricks is a 81 y.o. female presenting with decreased po intake and abdominal pain. PMH is significant for CAD, asthma,  HTN, hypothyroidism with goiter, CKDIII, DDD with spinal stenosis of lumbar region, CHF, HLD, anxiety, acute renal failure, obesity and history of tobacco use. Has had cholecystectomy and appendectomy.   Abdominal pain/nausea/vomiting/decreased po intake, improved: Complaining of generalized abdominal pain, nausea, vomiting and decreased po intake worse over last 3-4 days but ongoing for last month. Also endorsing chest pain several days ago that sounded atypical in nature; none at present. Differential is broad but infectious etiology seems less likely given lack of leukocytosis or fevers but did have inflammation of rectum on recent CT abdomen pelvis; no tenderness on rectal exam a.m. Of 3/31. Duration concerning for more chronic process like R heart failure, hepatitis or subacute pancreatitis; AST and ALT elevated to 122 and 79, respectively, with alk phos 168 and Tbili 1.7. Consider possible ascites in setting of heart failure as ascites was noted on recent CT in 12/11/2016. LA elevated to 1.99 and improved to 1.29 with fluids. CXR with cardiomegaly with small right pleural effusion, no frank edema and airspace consolidation in RLL suspicious for PNA.  BNP elevated >4,500.  No crackles on lung exam and patient not short of breath.  Currently on her home 2L O2 and satting 100% and actually tolerating RA from time to time. Exhibiting some LE edema but with no other obvious signs of fluid overload.    -Admitted to  telemetry, attending Dr. Jennette Kettle  -Advance diet as tolerated to heart healthy --> tolerating solids a.m. of 3/31 -Obtain ECHO (last performed in 2015) -Will hold off on imaging for now in setting of AKI.  Plan to obtain CT C/A/P once AKI resolving.  -follow up Bcx/Ucx  -HIV -Hepatitis panel -PT/OT consults placed  -FOBT -GI panel by stool (needs collecting) -Enteric precautions -rectal exam without tenderness -no pain or vaginal masses felt on bimanual but may have palpable uterus -Tylenol 650 mg Q6 prn for pain  -Zofran 4 mg Q6 prn for nausea  -I's and O's  -daily weights  -pulse ox checks with vitals  -vitals per unit routine   Acute Renal Failure, improving:  SCr 2.34, down from 3.49 on admission. Likely dehydration in setting of decreased po over last several days. BL ~0.9.  With CKD III.  Caution with fluids in setting of possible heart failure.  Uses Mobic at home and GI note from Jan 2017 noting that she uses a lot of NSAIDs.  Had renal failure during hospital stay in Sept 2017.  -gentle IVFs prn in setting of possible heart failure exacerbation but MM very dry on exam -AM BMET   Diarrhea, ongoing: Still with watery diarrhea. Reports non-bloody diarrhea x several months.  C-diff negative.   -GI panel by stool  -Enteric precautions -FOBT  -Rectal exam negative for tenderness -Light IV fluid hydration  Weakness: likely related to poor po intake although degenerative disc disease is probably contributing to history of falls.  Has had recent fall but did not hit head.  No LOC.   - PT/OT consulted  CAD: Troponins have been elevated to 0.09 in the past. Will order serial trops and monitor.   -continue home aspirin, Zocor 20 mg and Plavix  -Trend troponins (0.12 > 0.10 > 0.09) -AM EKG   Urinary symptoms: Reports increased frequency, urgency and dysuria.  UA with trace leuks.  Urine sent for culture. Gram stain with gram variable rods and gram positive cocci in pairs.  -Will  hold off on antibiotics for now given no leukocytosis and no fever.   -Follow up urine cx  -Continue to monitor   CHF:   Follows with cardiology at [facility].  Last saw Cards on 01/03/2017 for bilateral carotid artery stenosis.  Echo in 2015 with EF 55-60% and some impaired diastolic filling.   Cath 06/2015 with 50% proximal LAD. Per cardiology note, takes Lasix but not on a regular basis, only about once a week for leg swelling.  Baseline BNP ~ 400, so grossly elevated at > 4500. - Obtain ECHO - I/Os, daily weights  Asthma, stable:  At home on Advair diskus.  Not on formulary but ordered as Breo Ellipta.  -Continue Breo Ellipta 1 puff daily   HTN:  BP on arrival 165/71.  At home on Imdur 30 mg daily, Lopressor 100 mg BID, and Norvasc 10 mg daily .   -Continue home Imdur and Lopressor.   -Hold Norvasc for now in setting of leg swelling  -Monitor blood pressures  Hypothyroidism, stable.  Last TSH 0.639 and wnl, on 07/01/2016.  At home on Synthroid.  -Continue home Synthroid 75 mcg   DDD with spinal stenosis of lumbar region, stable.  Complaining of back pain on admission but this is not new.  Denies acute symptoms.  At home on Norco 10-325 Q4 PRN.   -Continue home pain med  Anxiety/Depression:  At home on Zoloft.  Per daughter, used to take Ativan but no longer is on it.  -Continue Zoloft 50 mg po at bedtime   CKD III:  Follows with [facility].    -Continue home sodium bicarb 650 mg daily    Insomnia.  At home on Trazodone 100 mg prn at bedtime.  -Continue home Trazodone  Anemia: hgb 8.9 > 9.1, suspect anemia of chronic disease. Last colonoscopy in 11/2013 (Dr. Shelle Iron) small and large mouth diverticula in sigmoid, internal hemorrhoids, 4 mm polyp path-TA negative HGD. - Will obtain iron studies - FOBT   FEN/GI: Heart healthy diet, will restart light IVFs Prophylaxis: Heparin, Protonix   Disposition: Admit to med-surg for IV hydration and pain control,  attending Dr. Jennette Kettle; patient interested in going back to [facility] (SNF) but awaiting PT/OT recs   Subjective:  Patient reports sleeping well and being able to move around more today with her nurse's encouragement. Able to eat eggs and bacon this a.m. Still with regular, watery diarrhea. Still has abdominal pain but much better than yesterday. Able to take Christopher off for period of time this am after blowing nose. Was sleeping almost flat when this examiner checked on patient last night.   Objective: Temp:  [97.7 F (36.5 C)-98.2 F (36.8 C)] 98.2 F (36.8 C) (03/31 0419) Pulse Rate:  [50-61] 57 (03/31 0419) Resp:  [15-26] 17 (03/31 0419) BP: (146-183)/(70-94) 158/85 (03/31 0419) SpO2:  [96 %-100 %] 98 % (03/31 0419) Physical Exam: General: Elderly female, sitting up on bedside commode, alert Cardiovascular: RRR, S1, S2, no murmurs appreciated Respiratory: Poor breath sounds over lower lung fields but no wheezes or crackles. Speaks easily in  complete sentences.  Abdomen: Diffuse, mild TTP across abdomen, improved from yesterday. +BS GU: Small external hemorrhoid on rectal exam but no internal hemorrhoids appreciated and no tenderness. No pain with bimanual exam but do think uterus may be palpable. L labia majora edematous and a little tender but not erythematous.  Extremities: +1 LE edema bilaterally with 2+ pedal edema. Moves all spontaneously.   Laboratory:  Recent Labs Lab 01/06/17 0722 01/07/17 0440  WBC 5.7 5.9  HGB 8.9* 9.1*  HCT 33.1* 34.5*  PLT 128* 120*    Recent Labs Lab 01/03/17 1235 01/06/17 0722 01/07/17 0440  NA 138 138 143  K 4.1 4.7 4.1  CL 98* 96* 102  CO2 31 30 28   BUN 45* 55* 46*  CREATININE 2.71* 3.49* 2.34*  CALCIUM 9.0 9.3 9.0  PROT  --  6.3*  --   BILITOT  --  1.7*  --   ALKPHOS  --  168*  --   ALT  --  79*  --   AST  --  122*  --   GLUCOSE 102* 87 81    Imaging/Diagnostic Tests:  Dg Chest Port 1 View  Result Date: 01/06/2017 CLINICAL DATA:   Weakness with nausea and vomiting EXAM: PORTABLE CHEST 1 VIEW COMPARISON:  July 02, 2016 FINDINGS: There is airspace consolidation with volume loss the right lower lobe. There is a right pleural effusion. Left lung is clear. Heart is enlarged with pulmonary vascularity within normal limits. No adenopathy. There is atherosclerotic calcification in the aorta. There is degenerative change in the shoulders. IMPRESSION: Airspace consolidation with volume loss in the right lower lobe. Suspect pneumonia. There is cardiomegaly with small right pleural effusion. No frank edema. Left lung clear. There is aortic atherosclerosis. Electronically Signed   By: Bretta Bang III M.D.   On: 01/06/2017 08:32   Ct Abdomen Pelvis W Contrast  Result Date: 12/11/2016 CLINICAL DATA:  Acute onset of generalized abdominal pain, nausea, vomiting and diarrhea. Generalized weakness. Initial encounter. IMPRESSION: 1. Nonspecific presacral stranding, with question of underlying soft tissue inflammation about the rectum. Would correlate clinically for any evidence of proctitis. 2. Mildly nodular contour of the liver raises concern for mild hepatic cirrhosis. Trace ascites noted about the liver. 3. Small right and trace left pleural effusions. Right basilar atelectasis noted. 4. Mild cardiomegaly. 5. Focal scarring at the midportion of the right kidney. 6. Scattered diverticulosis along the sigmoid colon, without evidence of diverticulitis. 7. Ectatic abdominal aorta at risk for aneurysm development. Recommend followup by ultrasound in 5 years. This recommendation follows ACR consensus guidelines: White Paper of the ACR Incidental Findings Committee II on Vascular Findings. J Am Coll Radiol 2013; 10:789-794. 8. Likely mild luminal narrowing along the common iliac arteries bilaterally, secondary to calcific atherosclerotic disease. Scattered aortic atherosclerosis. 9. Mild right convex thoracolumbar scoliosis. Partial chronic osseous  fusion at L1-L2. 10. Multiple calcified uterine fibroids noted. Electronically Signed   By: Roanna Raider M.D.   On: 12/11/2016 16:58   Ilay Capshaw Percell Boston, MD 01/07/2017, 8:57 AM PGY-2, [facility] FPTS Intern pager: [phone], text pages welcome

## 2017-01-08 ENCOUNTER — Inpatient Hospital Stay (HOSPITAL_COMMUNITY): Payer: Medicare Other

## 2017-01-08 DIAGNOSIS — I509 Heart failure, unspecified: Secondary | ICD-10-CM

## 2017-01-08 LAB — HEPATITIS PANEL, ACUTE
HCV Ab: <0.1 s/co ratio (ref 0.0–0.9)
HEP A IGM: NEGATIVE
Hep B C IgM: NEGATIVE
Hepatitis B Surface Ag: NEGATIVE

## 2017-01-08 LAB — COMPREHENSIVE METABOLIC PANEL
ALBUMIN: 3.5 g/dL (ref 3.5–5.0)
ALK PHOS: 117 U/L (ref 38–126)
ALT: 48 U/L (ref 14–54)
AST: 51 U/L — AB (ref 15–41)
Anion gap: 6 (ref 5–15)
BUN: 35 mg/dL — AB (ref 6–20)
CALCIUM: 8.6 mg/dL — AB (ref 8.9–10.3)
CO2: 33 mmol/L — AB (ref 22–32)
CREATININE: 1.86 mg/dL — AB (ref 0.44–1.00)
Chloride: 103 mmol/L (ref 101–111)
GFR calc Af Amer: 29 mL/min — ABNORMAL LOW (ref >60)
GFR calc non Af Amer: 25 mL/min — ABNORMAL LOW (ref >60)
GLUCOSE: 140 mg/dL — AB (ref 65–99)
Potassium: 4.3 mmol/L (ref 3.5–5.1)
SODIUM: 142 mmol/L (ref 135–145)
Total Bilirubin: 0.9 mg/dL (ref 0.3–1.2)
Total Protein: 5.6 g/dL — ABNORMAL LOW (ref 6.5–8.1)

## 2017-01-08 LAB — IRON AND TIBC
Iron: 11 ug/dL — ABNORMAL LOW (ref 28–170)
SATURATION RATIOS: 3 % — AB (ref 10.4–31.8)
TIBC: 428 ug/dL (ref 250–450)
UIBC: 417 ug/dL

## 2017-01-08 LAB — CBC
HCT: 33.8 % — ABNORMAL LOW (ref 36.0–46.0)
HEMOGLOBIN: 8.6 g/dL — AB (ref 12.0–15.0)
MCH: 20.6 pg — AB (ref 26.0–34.0)
MCHC: 25.4 g/dL — ABNORMAL LOW (ref 30.0–36.0)
MCV: 81.1 fL (ref 78.0–100.0)
Platelets: 133 10*3/uL — ABNORMAL LOW (ref 150–400)
RBC: 4.17 MIL/uL (ref 3.87–5.11)
RDW: 19.4 % — ABNORMAL HIGH (ref 11.5–15.5)
WBC: 5.6 10*3/uL (ref 4.0–10.5)

## 2017-01-08 LAB — FERRITIN: FERRITIN: 27 ng/mL (ref 11–307)

## 2017-01-08 LAB — OCCULT BLOOD X 1 CARD TO LAB, STOOL: Fecal Occult Bld: NEGATIVE

## 2017-01-08 MED ORDER — DEXTROSE 5 % IV SOLN
1.0000 g | INTRAVENOUS | Status: DC
  Administered 2017-01-08: 1 g via INTRAVENOUS
  Filled 2017-01-08: qty 10

## 2017-01-08 NOTE — Evaluation (Signed)
Occupational Therapy Evaluation Patient Details Name: Carolyn Hendricks MRN: [id number] DOB: 01/18/1936 Today's Date: 01/08/2017    History of Present Illness Carolyn Hendricks a 81 y.o.femalepresenting with decreased po intake and abdominal pain. PMH is significant for CAD, asthma, HTN, hypothyroidism with goiter, CKDIII, DDD with spinal stenosis of lumbar region, CHF, HLD, anxiety, acute renal failure, obesity and history of tobacco use. Has had cholecystectomy and appendectomy.    Clinical Impression   PTA, pt was living with her daughter who assisted with home management. Pt was ambulating with a Rollator for functional mobility and until a few weeks ago could complete basic ADL on her own. She currently requires min assist for stand-pivot toilet transfer to [facility] and maximum assistance with LB ADL. Pt additionally was confused during OT evaluation questioning therapist concerning events that did not occur and was highly fearful of falling when using RW. She would benefit from continued OT services while admitted to improve independence with ADL and functional mobility. Recommend short-term SNF placement for continued rehabilitation services in order to maximize independence with ADL and return to PLOF. OT will continue to follow acutely.    Follow Up Recommendations  SNF;Supervision/Assistance - 24 hour    Equipment Recommendations  Other (comment) (TBD at next venue)    Recommendations for Other Services       Precautions / Restrictions Precautions Precautions: Fall Restrictions Weight Bearing Restrictions: No      Mobility Bed Mobility Overal bed mobility: Needs Assistance Bed Mobility: Supine to Sit     Supine to sit: Mod assist     General bed mobility comments: assist for LEs and elevate trunk  Transfers Overall transfer level: Needs assistance Equipment used: Rolling walker (2 wheeled) Transfers: Stand Pivot Transfers Sit to Stand: Min assist Stand pivot  transfers: Min assist       General transfer comment: Lifting assist to power up and steadying assist while on feet.    Balance Overall balance assessment: Needs assistance;History of Falls Sitting-balance support: No upper extremity supported;Feet supported;Bilateral upper extremity supported Sitting balance-Leahy Scale: Fair     Standing balance support: Bilateral upper extremity supported;During functional activity Standing balance-Leahy Scale: Poor Standing balance comment: Reliant on UE support.                           ADL either performed or assessed with clinical judgement   ADL Overall ADL's : Needs assistance/impaired Eating/Feeding: Set up;Supervision/ safety;Sitting Eating/Feeding Details (indicate cue type and reason): Sitting with back support Grooming: Minimal assistance;Sitting   Upper Body Bathing: Minimal assistance;Sitting   Lower Body Bathing: Maximal assistance;Sit to/from stand   Upper Body Dressing : Minimal assistance;Sitting   Lower Body Dressing: Maximal assistance;Sit to/from stand   Toilet Transfer: Minimal assistance;Stand-pivot;BSC;RW Toilet Transfer Details (indicate cue type and reason): VC's for safety and increased time. Toileting- Clothing Manipulation and Hygiene: Sit to/from stand;Minimal assistance         General ADL Comments: Pt becoming emotional at times speaking about how she is a burden to her family. OT and daughter reassured pt that she is not a burden. Pt slow moving and afraid of falling.      Vision Baseline Vision/History: Wears glasses Patient Visual Report: No change from baseline Vision Assessment?: No apparent visual deficits     Perception     Praxis      Pertinent Vitals/Pain Pain Assessment: Faces Faces Pain Scale: Hurts little more Pain Location: abdomen Pain Descriptors /  Indicators: Aching;Grimacing;Guarding;Sore Pain Intervention(s): Monitored during session;Limited activity within  patient's tolerance;Repositioned     Hand Dominance Right   Extremity/Trunk Assessment Upper Extremity Assessment Upper Extremity Assessment: Generalized weakness;RUE deficits/detail;LUE deficits/detail RUE Deficits / Details: Limited AROM shoulder 0-90 degrees. Reports that this shoulder needs a shot and is painful to move. LUE Deficits / Details: Grimacing when moving. Decreased shoulder AROM 0-100.   Lower Extremity Assessment Lower Extremity Assessment: Defer to PT evaluation       Communication Communication Communication: No difficulties   Cognition Arousal/Alertness: Awake/alert Behavior During Therapy: WFL for tasks assessed/performed Overall Cognitive Status: Impaired/Different from baseline Area of Impairment: Attention;Memory;Awareness                   Current Attention Level: Selective Memory: Decreased short-term memory     Awareness: Intellectual   General Comments: Pt perseverating on previous falls and hospital visits. Pt asking therapist "why did we go outside a few minutes ago" and telling her daughter that earlier today a family member came and she sat in a circle on the floor with him and held hands. Later reporting "What is going on with me?" and asking "why am I so confused."   General Comments       Exercises     Shoulder Instructions      Home Living Family/patient expects to be discharged to:: Private residence Living Arrangements: Children Available Help at Discharge: Family;Available PRN/intermittently Type of Home: House Home Access: Stairs to enter [employer] of Steps: 2 Entrance Stairs-Rails: None Home Layout: One level     Bathroom Shower/Tub: Producer, television/film/video: Standard Bathroom Accessibility: Yes   Home Equipment: Environmental consultant - 4 wheels;Cane - single point;Walker - standard (shower chair is worn out per pt)          Prior Functioning/Environment Level of Independence: Needs assistance  Gait /  Transfers Assistance Needed: Ambulates with rollator ADL's / Homemaking Assistance Needed: Daughter assists with IADL and dressing/bathing at times.   Comments: Until symptoms began, pt was completing basic ADL independently but has required assistance over the past few weeks as symptoms progressed.        OT Problem List: Decreased strength;Decreased range of motion;Decreased activity tolerance;Impaired balance (sitting and/or standing);Decreased safety awareness;Decreased knowledge of use of DME or AE;Decreased knowledge of precautions;Pain;Impaired UE functional use      OT Treatment/Interventions: Self-care/ADL training;Therapeutic exercise;Energy conservation;DME and/or AE instruction;Therapeutic activities;Patient/family education;Balance training    OT Goals(Current goals can be found in the care plan section) Acute Rehab OT Goals Patient Stated Goal: to get better OT Goal Formulation: With patient/family Time For Goal Achievement: 01/22/17 Potential to Achieve Goals: Good ADL Goals Pt Will Perform Grooming: standing;with supervision Pt Will Perform Lower Body Bathing: with min guard assist;sit to/from stand Pt Will Perform Lower Body Dressing: with min guard assist;sit to/from stand Pt Will Transfer to Toilet: with min guard assist;ambulating;bedside commode Pt Will Perform Toileting - Clothing Manipulation and hygiene: sit to/from stand;with supervision  OT Frequency: Min 2X/week   Barriers to D/C:            Co-evaluation              End of Session Equipment Utilized During Treatment: Gait belt;Rolling walker Nurse Communication: Mobility status  Activity Tolerance: Patient tolerated treatment well Patient left: in bed;with call bell/phone within reach;with family/visitor present  OT Visit Diagnosis: Muscle weakness (generalized) (M62.81);Unsteadiness on feet (R26.81)  Time: 1610-9604 OT Time Calculation (min): 35 min Charges:  OT General  Charges $OT Visit: 1 Procedure OT Evaluation $OT Eval Moderate Complexity: 1 Procedure OT Treatments $Self Care/Home Management : 8-22 mins G-Codes:     Doristine Section, MS OTR/L  Pager: [phone]   [place] Selke A Coumba Kellison 01/08/2017, 1:58 PM

## 2017-01-08 NOTE — Progress Notes (Addendum)
Family Medicine Teaching Service Daily Progress Note Intern Pager: [phone]  Patient name: Carolyn Hendricks Medical record number: [id number] Date of birth: 24-Feb-1936 Age: 81 y.o. Gender: female  Primary Care Provider: [name], MD Consultants: None Code Status: Full   Pt Overview and Major Events to Date:  3/30 - Admitted to FPTS for inability to tolerate PO  NEVAEH KORTE is a 81 y.o. female presenting with decreased po intake and abdominal pain. PMH is significant for CAD, asthma,  HTN, hypothyroidism with goiter, CKDIII, DDD with spinal stenosis of lumbar region, CHF, HLD, anxiety, acute renal failure, obesity and history of tobacco use. Has had cholecystectomy and appendectomy.   Abdominal pain/nausea/vomiting/decreased po intake, improved:  Did have inflammation of rectum on recent CT abdomen pelvis; no tenderness on rectal exam a.m. Of 3/31. Duration concerning for more chronic process like R heart failure, hepatitis or subacute pancreatitis; AST 51 and ALT now normalized 48, with alk phos 117 and Tbili 0.9.  Consider possible ascites in setting of heart failure as ascites was noted on recent CT in 12/11/2016.  Currently on her home 2L O2 and satting 100% and actually tolerating RA from time to time. Exhibiting some LE edema but with no other obvious signs of fluid overload.  Hepatitis panel negative. HIV nonreactive.  Had episode of vomiting this am but tolerated PO yesterday. -Advance diet as tolerated to heart healthy --> tolerating solids a.m. of 3/31.   -Awaiting ECHO (last performed in 2015) -follow up Bcx: NG x1 day; Ucx: multispecies -FOBT awaiting recollection -GI panel by stool collected and pending per RN -Enteric precautions -Tylenol 650 mg Q6 prn for pain  -Zofran 4 mg Q6 prn for nausea  -I's and O's  -daily weights  -pulse ox checks with vitals  -vitals per unit routine  -PT/OT: SNF (wants to go to [facility] in [place]) -Will hold off on imaging for now in setting of  AKI.  Plan to obtain CT C/A/P once AKI resolving.   Acute Renal Failure, improving:  SCr 1.86, down from 3.49 on admission. Likely dehydration in setting of decreased po over last several days. Baseline Cr ~0.9.  With CKD III.  Caution with fluids in setting of possible heart failure.  Uses Mobic at home and GI note from Jan 2017 noting that she uses a lot of NSAIDs.  Had renal failure during hospital stay in Sept 2017.  -gentle IVFs prn, will stop IVFs this afternoon given improving renal function and PO intake.  If unable to continue to take PO, will need to restart. -AM BMET   Diarrhea, ongoing: Still with watery diarrhea. Reports non-bloody diarrhea x several months.  C-diff negative.  Rectal exam negative for tenderness -GI panel by stool pending -Enteric precautions -FOBT pending -Gentle IV fluid hydration; NS _0 /h  Weakness: likely related to poor po intake although degenerative disc disease is probably contributing to history of falls.  Has had recent fall but did not hit head.  No LOC.   -PT/OT : SNF  CAD: Troponins have been elevated to 0.09 in the past. Will order serial trops and monitor.   -continue home aspirin, Zocor 20 mg and Plavix  -Trend troponins (0.12 > 0.10 > 0.09) -AM EKG not obtained 3/31.  Reordered for today.  Urinary symptoms: Reports increased frequency, urgency and dysuria.  UA with trace leuks.  Urine sent for culture. Gram stain with gram variable rods and gram positive cocci in pairs. Urine cx: multiple species. -Given urinary symptoms/ AKI/  abdominal pain, will start Rocephin 1g IV x3 days (4/1-4/3). -Continue to monitor   CHF:   Follows with cardiology at [facility].  Last saw Cards on 01/03/2017 for bilateral carotid artery stenosis.  Echo in 2015 with EF 55-60% and some impaired diastolic filling.   Cath 06/2015 with 50% proximal LAD. Per cardiology note, takes Lasix but not on a regular basis, only about once a week for leg swelling.   Baseline BNP ~ 400, so grossly elevated at > 4500. - Obtain ECHO: to be done today - I/Os, daily weights  Asthma, stable:  At home on Advair diskus.  Not on formulary but ordered as Breo Ellipta.  -Continue Breo Ellipta 1 puff daily   HTN, controlled.  At home on Imdur 30 mg daily, Lopressor 100 mg BID, and Norvasc 10 mg daily .   -Continue home Imdur and Lopressor.   -Hold Norvasc for now in setting of leg swelling  -Monitor blood pressures  Hypothyroidism, stable.  Last TSH 0.639 and wnl, on 07/01/2016.  At home on Synthroid.  -Continue home Synthroid 75 mcg   DDD with spinal stenosis of lumbar region, stable.  Complaining of back pain on admission but this is not new.  Denies acute symptoms.  At home on Norco 10-325 Q4 PRN.   -Continue home pain med  Anxiety/Depression:  At home on Zoloft.  Per daughter, used to take Ativan but no longer is on it.  -Continue Zoloft 50 mg po at bedtime   CKD III:  Follows with [facility] home sodium bicarb 650 mg daily    Insomnia.  At home on Trazodone 100 mg prn at bedtime.  -Continue home Trazodone  Anemia: hgb 8.9 > 9.1, suspect anemia of chronic disease. Last colonoscopy in 11/2013 (Dr. Rayann Heman) small and large mouth diverticula in sigmoid, internal hemorrhoids, 4 mm polyp path-TA negative HGD. Ferritin 27, Fe 11, TIBC 428 - FOBT pending - Start FeSO4 supplementation once acute illness resolved.   FEN/GI: Heart healthy diet, NS _0  cc/h to stop after lunch. Prophylaxis: Heparin, Protonix   Disposition: Admit to med-surg for IV hydration and pain control, attending Dr. Nori Riis; patient interested in going back to [facility] (SNF) but awaiting PT/OT recs   Subjective:  Patient reports that she tolerated PO ok yesterday.  She reports 1 episode of vomiting this am.  She reports fair appetite.  Symptoms otherwise stable.  Objective: Temp:  [98.3 F (36.8 C)-98.4 F (36.9 C)] 98.4 F (36.9 C) (04/01 0418) Pulse Rate:   [72-74] 72 (04/01 0418) Resp:  [16-17] 17 (04/01 0418) BP: (138-144)/(74-78) 139/74 (04/01 0418) SpO2:  [96 %-100 %] 96 % (04/01 0418) Weight:  [202 lb (91.6 kg)] 202 lb (91.6 kg) (04/01 0418) Physical Exam: General: Elderly female, sitting up about to have breakfast, NAD Cardiovascular: RRR, S1, S2, no murmurs appreciated, no JVD Respiratory: Poor breath sounds over lower lung fields but no wheezes or crackles. Speaks easily in complete sentences. Normal WOB on 3L [place] Abdomen: soft, obese, Mild TTP to epigastric area, otherwise nontender. No distention. +BS Extremities: +1 LE edema bilaterally with 2+ pedal edema. Moves all spontaneously.   Laboratory:  Recent Labs Lab 01/06/17 0722 01/07/17 0440 01/08/17 0333  WBC 5.7 5.9 5.6  HGB 8.9* 9.1* 8.6*  HCT 33.1* 34.5* 33.8*  PLT 128* 120* 133*    Recent Labs Lab 01/06/17 0722 01/07/17 0440 01/08/17 0333  NA 138 143 142  K 4.7 4.1 4.3  CL 96*  102 103  CO2 30 28 33*  BUN 55* 46* 35*  CREATININE 3.49* 2.34* 1.86*  CALCIUM 9.3 9.0 8.6*  PROT 6.3*  --  5.6*  BILITOT 1.7*  --  0.9  ALKPHOS 168*  --  117  ALT 79*  --  48  AST 122*  --  51*  GLUCOSE 87 81 140*    Imaging/Diagnostic Tests:  Dg Chest Port 1 View  Result Date: 01/06/2017 CLINICAL DATA:  Weakness with nausea and vomiting EXAM: PORTABLE CHEST 1 VIEW COMPARISON:  July 02, 2016 FINDINGS: There is airspace consolidation with volume loss the right lower lobe. There is a right pleural effusion. Left lung is clear. Heart is enlarged with pulmonary vascularity within normal limits. No adenopathy. There is atherosclerotic calcification in the aorta. There is degenerative change in the shoulders. IMPRESSION: Airspace consolidation with volume loss in the right lower lobe. Suspect pneumonia. There is cardiomegaly with small right pleural effusion. No frank edema. Left lung clear. There is aortic atherosclerosis. Electronically Signed   By: Lowella Grip III M.D.   On:  01/06/2017 08:32   Ct Abdomen Pelvis W Contrast  Result Date: 12/11/2016 CLINICAL DATA:  Acute onset of generalized abdominal pain, nausea, vomiting and diarrhea. Generalized weakness. Initial encounter. IMPRESSION: 1. Nonspecific presacral stranding, with question of underlying soft tissue inflammation about the rectum. Would correlate clinically for any evidence of proctitis. 2. Mildly nodular contour of the liver raises concern for mild hepatic cirrhosis. Trace ascites noted about the liver. 3. Small right and trace left pleural effusions. Right basilar atelectasis noted. 4. Mild cardiomegaly. 5. Focal scarring at the midportion of the right kidney. 6. Scattered diverticulosis along the sigmoid colon, without evidence of diverticulitis. 7. Ectatic abdominal aorta at risk for aneurysm development. Recommend followup by ultrasound in 5 years. This recommendation follows ACR consensus guidelines: White Paper of the ACR Incidental Findings Committee II on Vascular Findings. J Am Coll Radiol 2013; 10:789-794. 8. Likely mild luminal narrowing along the common iliac arteries bilaterally, secondary to calcific atherosclerotic disease. Scattered aortic atherosclerosis. 9. Mild right convex thoracolumbar scoliosis. Partial chronic osseous fusion at L1-L2. 10. Multiple calcified uterine fibroids noted. Electronically Signed   By: Garald Balding M.D.   On: 12/11/2016 16:58   Janora Norlander, DO 01/08/2017, 8:07 AM PGY-3, [place] Intern pager: [phone], text pages welcome

## 2017-01-08 NOTE — Progress Notes (Signed)
  Echocardiogram 2D Echocardiogram has been performed.  Carolyn Hendricks 01/08/2017, 4:20 PM

## 2017-01-09 ENCOUNTER — Telehealth: Payer: Self-pay | Admitting: Cardiovascular Disease

## 2017-01-09 DIAGNOSIS — N179 Acute kidney failure, unspecified: Secondary | ICD-10-CM | POA: Diagnosis present

## 2017-01-09 DIAGNOSIS — E440 Moderate protein-calorie malnutrition: Secondary | ICD-10-CM | POA: Diagnosis present

## 2017-01-09 DIAGNOSIS — R197 Diarrhea, unspecified: Secondary | ICD-10-CM | POA: Diagnosis present

## 2017-01-09 LAB — COMPREHENSIVE METABOLIC PANEL
ALK PHOS: 101 U/L (ref 38–126)
ALT: 39 U/L (ref 14–54)
ANION GAP: 8 (ref 5–15)
AST: 40 U/L (ref 15–41)
Albumin: 3.5 g/dL (ref 3.5–5.0)
BUN: 28 mg/dL — ABNORMAL HIGH (ref 6–20)
CALCIUM: 8.5 mg/dL — AB (ref 8.9–10.3)
CHLORIDE: 105 mmol/L (ref 101–111)
CO2: 31 mmol/L (ref 22–32)
CREATININE: 1.59 mg/dL — AB (ref 0.44–1.00)
GFR, EST AFRICAN AMERICAN: 35 mL/min — AB (ref >60)
GFR, EST NON AFRICAN AMERICAN: 30 mL/min — AB (ref >60)
Glucose, Bld: 104 mg/dL — ABNORMAL HIGH (ref 65–99)
Potassium: 4 mmol/L (ref 3.5–5.1)
Sodium: 144 mmol/L (ref 135–145)
Total Bilirubin: 0.9 mg/dL (ref 0.3–1.2)
Total Protein: 5.7 g/dL — ABNORMAL LOW (ref 6.5–8.1)

## 2017-01-09 LAB — ECHOCARDIOGRAM COMPLETE
CHL CUP DOP CALC LVOT VTI: 21.3 cm
EERAT: 7.42
FS: 33 % (ref 28–44)
IVS/LV PW RATIO, ED: 1
LA ID, A-P, ES: 41 mm
LA diam end sys: 41 mm
LADIAMINDEX: 2.19 cm/m2
LAVOL: 60.7 mL
LAVOLA4C: 53.1 mL
LAVOLIN: 32.5 mL/m2
LV PW d: 11 mm — AB (ref 0.6–1.1)
LV e' LATERAL: 6.74 cm/s
LVEEAVG: 7.42
LVEEMED: 7.42
LVOT SV: 60 mL
LVOT area: 2.84 cm2
LVOT diameter: 19 mm
LVOT peak grad rest: 4 mmHg
LVOTPV: 98.3 cm/s
MVPKAVEL: 109 m/s
MVPKEVEL: 50 m/s
RV LATERAL S' VELOCITY: 9.03 cm/s
RV sys press: 75 mmHg
Reg peak vel: 387 cm/s
TAPSE: 15.7 mm
TDI e' lateral: 6.74
TDI e' medial: 9.79
TR max vel: 387 cm/s
Weight: 3232 oz

## 2017-01-09 LAB — CBC
HCT: 33.2 % — ABNORMAL LOW (ref 36.0–46.0)
HEMOGLOBIN: 8.4 g/dL — AB (ref 12.0–15.0)
MCH: 20.6 pg — ABNORMAL LOW (ref 26.0–34.0)
MCHC: 25.3 g/dL — ABNORMAL LOW (ref 30.0–36.0)
MCV: 81.4 fL (ref 78.0–100.0)
Platelets: 121 10*3/uL — ABNORMAL LOW (ref 150–400)
RBC: 4.08 MIL/uL (ref 3.87–5.11)
RDW: 19.4 % — ABNORMAL HIGH (ref 11.5–15.5)
WBC: 4.9 10*3/uL (ref 4.0–10.5)

## 2017-01-09 MED ORDER — METOPROLOL TARTRATE 50 MG PO TABS
50.0000 mg | ORAL_TABLET | Freq: Two times a day (BID) | ORAL | Status: DC
  Administered 2017-01-09 – 2017-01-17 (×15): 50 mg via ORAL
  Filled 2017-01-09 (×17): qty 1

## 2017-01-09 NOTE — Progress Notes (Signed)
qPhysical Therapy Treatment Patient Details Name: Carolyn Hendricks MRN: [id number] DOB: 01-Jan-1936 Today's Date: 01/09/2017    History of Present Illness Carolyn Hendricks a 81 y.o.femalepresenting with decreased po intake and abdominal pain. PMH is significant for CAD, asthma, HTN, hypothyroidism with goiter, CKDIII, DDD with spinal stenosis of lumbar region, CHF, HLD, anxiety, acute renal failure, obesity and history of tobacco use. Has had cholecystectomy and appendectomy.     PT Comments    Pt seated in chair requesting to get back in bed when PT entered. Pt modAx2 for stand pivot transfer to [facility] using RW. Pt required mod assist for steadying so pt could clean herself. Pt ambulated 15 feet using RW with modAx1 and close chair follow, requiring max vc for safely backing up to chair and using UE to lower down to chair, however pt unable to control descent. Pt able to ambulated 5 feet back to bed with ModAx1 and required modAx2 to get back in the bed. Pt requires skilled PT to progress transfers and ambulation as well as to improve LE strength to be able to safely navigate her environment.    Follow Up Recommendations  SNF;Supervision/Assistance - 24 hour (Wants to go to [facility] in [place])     Equipment Recommendations  Other (comment);Wheelchair (measurements PT);Wheelchair cushion (measurements PT) (Needs a 3N1 (Lumex brand);lightweight w/c 18x16)    Recommendations for Other Services       Precautions / Restrictions Precautions Precautions: Fall Restrictions Weight Bearing Restrictions: No    Mobility  Bed Mobility Overal bed mobility: Needs Assistance Bed Mobility: Sit to Supine       Sit to supine: Mod assist;+2 for physical assistance   General bed mobility comments: assist for LEs into bed and straightening hips in the bed  Transfers Overall transfer level: Needs assistance Equipment used: Rolling walker (2 wheeled) Transfers: Sit to/from Stand Sit to Stand: Min  assist;+2 physical assistance Stand pivot transfers: Min assist;+2 physical assistance (to transfer to [facility])       General transfer comment: Pt needed cues for hand placement assist to upright, difficulty keeping from flexing forward over RW  Ambulation/Gait Ambulation/Gait assistance: Mod assist;+2 safety/equipment (close chair follow required) Ambulation Distance (Feet): 17 Feet (1x12, 1x5) Assistive device: Rolling walker (2 wheeled) Gait Pattern/deviations: Step-through pattern;Decreased stride length;Antalgic;Staggering left;Staggering right;Drifts right/left;Trunk flexed;Wide base of support Gait velocity: slowed Gait velocity interpretation: Below normal speed for age/gender General Gait Details: Pt walked to door with flexed forward posture and RW out in front of her despite vc to keep inside the walker freame,so when she sat down she had minimal support from the walker and decreased ability to reach back for the chair and inability to control descent to chair   Stairs            Wheelchair Mobility    Modified Rankin (Stroke Patients Only)       Balance Overall balance assessment: Needs assistance;History of Falls Sitting-balance support: No upper extremity supported;Feet supported Sitting balance-Leahy Scale: Fair     Standing balance support: Bilateral upper extremity supported;During functional activity Standing balance-Leahy Scale: Poor Standing balance comment: relies heavily on UE support for balance with cues needed to stand up tall.                High Level Balance Comments: mod assist for backing up to chair with RW            Cognition Arousal/Alertness: Awake/alert Behavior During Therapy: [facility] for tasks assessed/performed Overall Cognitive  Status: Impaired/Different from baseline Area of Impairment: Attention;Safety/judgement;Awareness                   Current Attention Level: Selective     Safety/Judgement: Decreased  awareness of safety Awareness: Intellectual   General Comments: despite repeated vc pt has difficulty staying inside walker for stability and then when she feels weak decreased ability to use safe positioning to sit in chair      Exercises      General Comments        Pertinent Vitals/Pain Pain Assessment: Faces Faces Pain Scale: Hurts little more Pain Location: abdomen Pain Descriptors / Indicators: Aching;Grimacing;Guarding;Sore Pain Intervention(s): Monitored during session;Limited activity within patient's tolerance        Pt on 3 L O2 throughout session. VSS         PT Goals (current goals can now be found in the care plan section) Acute Rehab PT Goals Patient Stated Goal: to get better PT Goal Formulation: With patient Time For Goal Achievement: 01/21/17 Potential to Achieve Goals: Good    Frequency    Min 2X/week      PT Plan Current plan remains appropriate       End of Session Equipment Utilized During Treatment: Gait belt;Oxygen Activity Tolerance: Patient limited by fatigue;Patient limited by pain (limited by nausea) Patient left: with call bell/phone within reach;in bed Nurse Communication: Mobility status PT Visit Diagnosis: Unsteadiness on feet (R26.81);Muscle weakness (generalized) (M62.81)     Time: 1410-1450 PT Time Calculation (min) (ACUTE ONLY): 40 min  Charges:  $Gait Training: 8-22 mins $Therapeutic Activity: 23-37 mins                    G Codes:       Carolyn Hendricks PT, DPT Acute Rehabilitation  [phone] Pager [phone]    Carolyn Hendricks 01/09/2017, 4:32 PM

## 2017-01-09 NOTE — Consult Note (Addendum)
[facility] CM Primary Care Navigator  01/09/2017  Carolyn Hendricks Oct 20, 1935 [id number]   Went back to see patient today at the bedside to identify possible discharge needs. Patient reports having abdominal pain, nausea, vomiting and diarrhea that had led to this admission.  Patient endorses Dr. [name] with [facility] as the primary care provider.   Patient shared using [facility] in [place] to obtain medications without difficulty.   She reports that daughter Hilda Blades) manages her medications at home.  Patient's daughter had been providing transportation to her doctors' appointments per patient.  Her daughter provides care for her at home "but not able to as much as before" according to patient.   Discharge plan is still to be determined per patient but prefers to go to [facility] [employer]. PT/ OT recommends short-term SNF placement (skilled nursing facility) for continued rehabilitation services in order to maximize independence prior to returning home.   Patient voiced understanding to call primary care provider's office when she returns home, for a post discharge follow-up appointment within a week or sooner if needed. Patient letter (with PCP's contact number) was provided as their reminder.  Explained to patient about [facility] CM services available for healthmanagement (COPD/ HF) but she was undecided and states that daughter is helping her manage it.  On the other hand, she agreed to discuss with provider on follow-up visit about the need for help in further managing it at home.  Encouraged patient to get referral to [place] if deemed appropriate for it.  [facility] care management contact information provided for future needs that may arise.   Primary care provider's office called Alyson Ingles M.) and notified of patient's health issues and possible need for referral to [facility] after skilled nursing facility discharge for  help with health management (HF/ COPD) at home. Made aware of patient's preference for phone calls instead of home visits.   For questions, please contact:  Dannielle Huh, BSN, RN- [facility] Primary Care Navigator  Telephone: [phone] [place]

## 2017-01-09 NOTE — Progress Notes (Signed)
Family Medicine Teaching Service Daily Progress Note Intern Pager: [phone]  Patient name: Carolyn Hendricks Medical record number: [id number] Date of birth: [birth year] Age: 81 y.o. Gender: female  Primary Care Provider: [name], MD Consultants: None Code Status: Full   Pt Overview and Major Events to Date:  3/30 - Admitted to FPTS for inability to tolerate PO  Carolyn Hendricks is a 81 y.o. female presenting with decreased po intake and abdominal pain. PMH is significant for CAD, asthma,  HTN, hypothyroidism with goiter, CKDIII, DDD with spinal stenosis of lumbar region, CHF, HLD, anxiety, acute renal failure, obesity and history of tobacco use. Has had cholecystectomy and appendectomy.   Abdominal pain/nausea/vomiting/decreased po intake, improved:  Uncertain etiology, considering gastroenteritis though duration concerning for more chronic process like R heart failure. Hepatitis less likely with negative Hep panel.   Had episode of vomiting this am but tolerated PO yesterday.  -follow up Bcx: NG x2 day; Ucx: multispecies -GI panel by stool collected and pending per RN -Tylenol 650 mg Q6 prn for pain  -Zofran 4 mg Q6 prn for nausea  - Will hold off on imaging for now in setting of AKI.  Consider CT C/A/P once AKI resolving.   CHF, right heart failure, acute:   Follows with cardiology at [facility]. Baseline BNP ~ 400, so grossly elevated at > 4500. +LE edema. Severe dilation of right atrium on cardiac echo 4/2 with septal motion paradox consistent with RV volume overload, ascites was noted on recent CT in 12/11/2016. - I/Os, daily weights - cardiology consult - I's and O's  - daily weights  - beta blocker decreased for bradycardia  Acute on chronic Renal Failure, improving:  SCr 1.59 from 3.49 on admission. Likely dehydration from decreased PO over last several days. Baseline Cr ~0.9.  With CKD III.  - fluids held now tolerating normal diet -AM BMET   Anemia: hgb 8.9  > 9.1, suspect anemia of chronic disease. Last colonoscopy in 11/2013 (Dr. Rayann Heman) small and large mouth diverticula in sigmoid, internal hemorrhoids, 4 mm polyp path-TA negative HGD.  - Start FeSO4 supplementation once acute illness resolved.  Weakness: likely related to poor po intake although degenerative disc disease is probably contributing to history of falls.  Has had recent fall but did not hit head.  No LOC.   -PT/OT : SNF  CAD: Troponins have been elevated to 0.09 in the past. Will order serial trops and monitor.   -continue home aspirin, Zocor 20 mg and Plavix  -Trend troponins (0.12 > 0.10 > 0.09) - AM EKG  Urinary symptoms: Initial Reports increased frequency, urgency and dysuria.  UA with trace leuks. Gram stain with gram variable rods and gram positive cocci in pairs. Urine cx: multiple species. - monitor, consider treating if patient complains of symptoms - Continue to monitor   Asthma, stable:  At home on Advair diskus.  Not on formulary but ordered as Breo Ellipta.  -Continue Breo Ellipta 1 puff daily   HTN, controlled.  At home on Imdur 30 mg daily, Lopressor 100 mg BID, and Norvasc 10 mg daily .   -Continue home Imdur and Lopressor.   -Hold Norvasc for now in setting of leg swelling  -Monitor blood pressures  Hypothyroidism, stable.  Last TSH 0.639 and wnl, on 07/01/2016.  At home on Synthroid.  -Continue home Synthroid 75 mcg   DDD with spinal stenosis of lumbar region, stable.  Complaining of back pain on admission but this is not  new.  Denies acute symptoms.  At home on Norco 10-325 Q4 PRN.   -Continue home pain med  Anxiety/Depression:  At home on Zoloft.  Per daughter, used to take Ativan but no longer is on it.  -Continue Zoloft 50 mg po at bedtime   CKD III:  Follows with [facility] home sodium bicarb 650 mg daily    Insomnia.  At home on Trazodone 100 mg prn at bedtime.  -Continue home Trazodone   FEN/GI: Heart healthy  diet Prophylaxis: Heparin, Protonix   Disposition: Admit to med-surg for IV hydration and pain control, attending Dr. Nori Riis; patient interested in going back to [facility] (SNF) but awaiting PT/OT recs   Subjective:  Patient says she had 1 episode of vomiting yesterday however nursing at bedside indicates she has been tolerating a regular diet. Continued epigastric pain and nonbloody diarrhea. No fevers or acute events overnight.  Objective: Temp:  [97.5 F (36.4 C)-98.1 F (36.7 C)] 98.1 F (36.7 C) (04/02 0426) Pulse Rate:  [56-60] 59 (04/02 1108) Resp:  [17-20] 17 (04/02 0426) BP: (127-165)/(53-88) 149/70 (04/02 1108) SpO2:  [92 %-100 %] 100 % (04/02 0426) Weight:  [100.8 kg (222 lb 4.8 oz)] 100.8 kg (222 lb 4.8 oz) (04/02 0834) Physical Exam: General: Elderly female, sitting up about to have breakfast, NAD Cardiovascular: RRR, S1, S2, no murmurs appreciated, no JVD Respiratory: Poor breath sounds over lower lung fields but no wheezes or crackles. Speaks easily in complete sentences. Normal WOB on 3L [place] Abdomen: soft, obese, Mild TTP to epigastric area, otherwise nontender. No distention. +BS Extremities: +1 LE edema bilaterally with 2+ pedal edema. Moves all spontaneously.   Laboratory: Hepatitis panel negative. HIV nonreactive. FOBT negative C diff negative Ferritin 27, Fe 11, TIBC 428  Hepatic Function Latest Ref Rng & Units 01/09/2017 01/08/2017 01/06/2017  Total Protein 6.5 - 8.1 g/dL 5.7(L) 5.6(L) 6.3(L)  Albumin 3.5 - 5.0 g/dL 3.5 3.5 3.9  AST 15 - 41 U/L 40 51(H) 122(H)  ALT 14 - 54 U/L 39 48 79(H)  Alk Phosphatase 38 - 126 U/L 101 117 168(H)  Total Bilirubin 0.3 - 1.2 mg/dL 0.9 0.9 1.7(H)     Recent Labs Lab 01/07/17 0440 01/08/17 0333 01/09/17 0323  WBC 5.9 5.6 4.9  HGB 9.1* 8.6* 8.4*  HCT 34.5* 33.8* 33.2*  PLT 120* 133* 121*    Recent Labs Lab 01/06/17 0722 01/07/17 0440 01/08/17 0333 01/09/17 0323  NA 138 143 142 144  K 4.7 4.1 4.3 4.0  CL 96* 102  103 105  CO2 30 28 33* 31  BUN 55* 46* 35* 28*  CREATININE 3.49* 2.34* 1.86* 1.59*  CALCIUM 9.3 9.0 8.6* 8.5*  PROT 6.3*  --  5.6* 5.7*  BILITOT 1.7*  --  0.9 0.9  ALKPHOS 168*  --  117 101  ALT 79*  --  48 39  AST 122*  --  51* 40  GLUCOSE 87 81 140* 104*    Imaging/Diagnostic Tests:  Dg Chest Port 1 View 01/06/2017 FINDINGS: There is airspace consolidation with volume loss the right lower lobe. There is a right pleural effusion. Left lung is clear. Heart is enlarged with pulmonary vascularity within normal limits. No adenopathy. There is atherosclerotic calcification in the aorta. There is degenerative change in the shoulders.  IMPRESSION: Airspace consolidation with volume loss in the right lower lobe. Suspect pneumonia. There is cardiomegaly with small right pleural effusion. No frank edema. Left lung clear. There is aortic  atherosclerosis.   Ct Abdomen Pelvis W Contrast 12/11/2016  IMPRESSION:  1. Nonspecific presacral stranding, with question of underlying soft tissue inflammation about the rectum. Would correlate clinically for any evidence of proctitis.  2. Mildly nodular contour of the liver raises concern for mild hepatic cirrhosis. Trace ascites noted about the liver.  3. Small right and trace left pleural effusions. Right basilar atelectasis noted.  4. Mild cardiomegaly.  5. Focal scarring at the midportion of the right kidney.  6. Scattered diverticulosis along the sigmoid colon, without evidence of diverticulitis.  7. Ectatic abdominal aorta at risk for aneurysm development. Recommend followup by ultrasound in 5 years. This recommendation follows ACR consensus guidelines: White Paper of the ACR Incidental Findings Committee II on Vascular Findings. J Am Coll Radiol 2013; 10:789-794. 8. Likely mild luminal narrowing along the common iliac arteries bilaterally, secondary to calcific atherosclerotic disease. Scattered aortic atherosclerosis.  9. Mild right convex thoracolumbar  scoliosis. Partial chronic osseous fusion at L1-L2.  10. Multiple calcified uterine fibroids noted.8   Cardiac Echo Study Conclusions - Procedure narrative: Transthoracic echocardiography. Image   quality was adequate. The study was technically difficult, as a   result of body habitus. - Left ventricle: The cavity size was normal. Wall thickness was   increased in a pattern of mild LVH. Systolic function was normal.   The estimated ejection fraction was in the range of 60% to 65%.   Wall motion was normal; there were no regional wall motion   abnormalities. Doppler parameters are consistent with abnormal   left ventricular relaxation (grade 1 diastolic dysfunction). The   Ee&' ratio is between 8-15, suggesting indeterminate LV filling   pressure. - Ventricular septum: Septal motion showed paradox. The contour   showed diastolic flattening and systolic flattening. This   suggests pressure and volume overload of the RV. - Mitral valve: Calcified annulus. Mildly thickened leaflets .   There was trivial regurgitation. - Left atrium: The atrium was normal in size. - Right ventricle: The cavity size was moderately dilated. Mildly   reduced systolic function. - Right atrium: Severely dilated. - Tricuspid valve: There was moderate regurgitation. - Pulmonary arteries: PA peak pressure: 75 mm Hg (S). - Inferior vena cava: The vessel was dilated. The respirophasic   diameter changes were blunted (< 50%), consistent with elevated   central venous pressure.  Impressions: - Technically difficult study. LVEF 60-65%, mild LVH, normal wall   motion, grade 1 DD with indeterminate LV filling pressure,   moderate RAE with mildly reduced RV systolic function,   ventricular septal flattening (Septal-D sign) and paradoxical   motion consistent with pressure and volume overload of the RV,   severe RAE, moderate TR, RVSP 75 mmHg, dilated IVC (but no clear   flow reversal in the hepatic vein), no  pericardial effusion.   Everrett Coombe, MD 01/09/2017, 2:00 PM PGY-1, [place] Intern pager: [phone], text pages welcome

## 2017-01-09 NOTE — Consult Note (Signed)
[facility] CM Primary Care Navigator  01/09/2017  Carolyn Hendricks [birth year] [id number]   Went to see patientat the bedside to identify possible discharge needs but staffis in the room providing patient care.  Will attempt to see patient at another time, when available in room.  For questions, please contact:  Dannielle Huh, BSN, RN- [facility] Primary Care Navigator  Telephone: [phone] [place]

## 2017-01-09 NOTE — Telephone Encounter (Signed)
Patient admitted to [facility] . Dr. Annamaria Boots calling to review worsening echo results with Dr. Rockey Situ   Please call [phone]   Pager if no answer [phone]

## 2017-01-10 ENCOUNTER — Inpatient Hospital Stay (HOSPITAL_COMMUNITY): Payer: Medicare Other

## 2017-01-10 DIAGNOSIS — R112 Nausea with vomiting, unspecified: Secondary | ICD-10-CM

## 2017-01-10 DIAGNOSIS — I270 Primary pulmonary hypertension: Secondary | ICD-10-CM

## 2017-01-10 DIAGNOSIS — K529 Noninfective gastroenteritis and colitis, unspecified: Secondary | ICD-10-CM

## 2017-01-10 DIAGNOSIS — N179 Acute kidney failure, unspecified: Secondary | ICD-10-CM

## 2017-01-10 LAB — TSH: TSH: 1.881 u[IU]/mL (ref 0.350–4.500)

## 2017-01-10 LAB — BASIC METABOLIC PANEL
ANION GAP: 9 (ref 5–15)
BUN: 21 mg/dL — ABNORMAL HIGH (ref 6–20)
CALCIUM: 8.9 mg/dL (ref 8.9–10.3)
CHLORIDE: 103 mmol/L (ref 101–111)
CO2: 34 mmol/L — AB (ref 22–32)
Creatinine, Ser: 1.28 mg/dL — ABNORMAL HIGH (ref 0.44–1.00)
GFR calc Af Amer: 45 mL/min — ABNORMAL LOW (ref >60)
GFR calc non Af Amer: 39 mL/min — ABNORMAL LOW (ref >60)
GLUCOSE: 100 mg/dL — AB (ref 65–99)
Potassium: 4 mmol/L (ref 3.5–5.1)
Sodium: 146 mmol/L — ABNORMAL HIGH (ref 135–145)

## 2017-01-10 LAB — CBC
HEMATOCRIT: 32.1 % — AB (ref 36.0–46.0)
HEMOGLOBIN: 8.2 g/dL — AB (ref 12.0–15.0)
MCH: 20.9 pg — ABNORMAL LOW (ref 26.0–34.0)
MCHC: 25.5 g/dL — AB (ref 30.0–36.0)
MCV: 81.7 fL (ref 78.0–100.0)
Platelets: 120 10*3/uL — ABNORMAL LOW (ref 150–400)
RBC: 3.93 MIL/uL (ref 3.87–5.11)
RDW: 19.5 % — ABNORMAL HIGH (ref 11.5–15.5)
WBC: 3.9 10*3/uL — ABNORMAL LOW (ref 4.0–10.5)

## 2017-01-10 LAB — GASTROINTESTINAL PANEL BY PCR, STOOL (REPLACES STOOL CULTURE)

## 2017-01-10 LAB — RETICULOCYTES
RBC.: 4.29 MIL/uL (ref 3.87–5.11)
RETIC CT PCT: 2.1 % (ref 0.4–3.1)
Retic Count, Absolute: 90.1 10*3/uL (ref 19.0–186.0)

## 2017-01-10 MED ORDER — SODIUM CHLORIDE 0.9% FLUSH: 3.0000 mL | INTRAVENOUS | Status: DC | PRN

## 2017-01-10 MED ORDER — QUETIAPINE FUMARATE 50 MG PO TABS
25.0000 mg | ORAL_TABLET | Freq: Every day | ORAL | Status: AC
  Administered 2017-01-10: 25 mg via ORAL
  Filled 2017-01-10: qty 1

## 2017-01-10 MED ORDER — SODIUM CHLORIDE 0.9 % IV SOLN: 250.0000 mL | INTRAVENOUS | Status: DC | PRN

## 2017-01-10 MED ORDER — FERROUS SULFATE 325 (65 FE) MG PO TABS
325.0000 mg | ORAL_TABLET | Freq: Every day | ORAL | Status: DC
  Administered 2017-01-10 – 2017-01-17 (×8): 325 mg via ORAL
  Filled 2017-01-10 (×8): qty 1

## 2017-01-10 MED ORDER — SODIUM CHLORIDE 0.9% FLUSH
3.0000 mL | Freq: Two times a day (BID) | INTRAVENOUS | Status: DC
  Administered 2017-01-10: 3 mL via INTRAVENOUS

## 2017-01-10 MED ORDER — QUETIAPINE FUMARATE 50 MG PO TABS: 50.0000 mg | ORAL_TABLET | Freq: Every day | ORAL | Status: DC

## 2017-01-10 MED ORDER — TECHNETIUM TC 99M DIETHYLENETRIAME-PENTAACETIC ACID: 30.0000 | Freq: Once | INTRAVENOUS | Status: DC | PRN

## 2017-01-10 MED ORDER — CHOLESTYRAMINE 4 G PO PACK
4.0000 g | PACK | Freq: Every day | ORAL | Status: DC
  Administered 2017-01-10 – 2017-01-16 (×6): 4 g via ORAL
  Filled 2017-01-10 (×8): qty 1

## 2017-01-10 MED ORDER — TECHNETIUM TO 99M ALBUMIN AGGREGATED
4.0000 | Freq: Once | INTRAVENOUS | Status: AC | PRN
  Administered 2017-01-10: 4 via INTRAVENOUS

## 2017-01-10 MED ORDER — SODIUM CHLORIDE 0.9 % IV SOLN
INTRAVENOUS | Status: DC
  Administered 2017-01-11: 06:00:00 via INTRAVENOUS

## 2017-01-10 NOTE — Telephone Encounter (Signed)
We can schedule patient be seen in the office when she is out of the hospital to discuss echocardiogram findings

## 2017-01-10 NOTE — Consult Note (Signed)
CARDIOLOGY CONSULT NOTE   Patient ID: JULIENNE VOGLER MRN: [id number] DOB/AGE: Mar 19, 1936 81 y.o.  Admit date: 01/06/2017  Requesting Physician: Dr. Nori Riis Primary Physician:   [name], MD Primary Cardiologist:  Dr. Rockey Situ / Dr Fletcher Anon (PAD)  Reason for Consultation: abnormal ECHO  HANA TRIPPETT is a 81 y.o. female who is being seen today for the evaluation of abnormal echo at the request of Dr. Nori Riis.  HPI: PSALMS OLARTE is a 81 y.o. female with a history of HTN, tobacco abuse, atrial tachycardia, chronic anemia, carotid artery disease s/p L CEA, PAD, COPD on 2L home 02, LBBB, non obst CAD, CKD, chronic diastolic CHF and chronic LE edema who presented to [facility] on 01/06/17 with abdominal pain, N/V, decreased PO intake. Cardiology asked to consult given abnormal echocardiogram.   2D ECHO in 02/2014 showed normal LV and RV function with some diastolic dysfunction, mild MR and mild TR.   She had chest pain concerning for angina and underwent cardiac catheterization in 06/2015 showing 50% prox LAD. However, she was noted to have right common iliac artery stenosis on angiography. The patient underwent noninvasive cardiac evaluation which showed normal ABI bilaterally. Duplex showed significant bilateral common iliac artery stenosis worse on the right side. The patient's mobility is somewhat limited due to poor balance. She walks with a cane. She was seen by Dr. Fletcher Anon and plan was for continue surveillance given CKD and poor mobility.   She also has a history of LE edema for which she has been prescribed lasix in the past and compression stockings.   She was admitted in 06/2016 for AMS. Cardiology was asked to consult for mildly elevated troponin. Acute encephalopathy felt to be 2/2 CO2 narcosis. Cardiology felt chest pain more GI. No further work up indicated.   She was seen in the [facility] ED on 12/11/16 for abdominal pain. CT abdomen showed nonspecific presacral stranding (? proctatitis),  mildly nodular liver contours (? Hepatitis), small pleural effusion, mild cardiomegaly, scattered diverticulosis without evidence of diverticulitis, ectatic abdominal aorta at risk for aneurysm development (recommend followup by ultrasound in 5 years). She was discharged home.   She presented to [facility] on 01/06[facility]17 with abdominal pain, N/V, decreased PO intake. Work up still with uncertain etiology. Hepatitis less likely with negative Hep panel. There was concern for right sided heart failure. 2D ECHO showed normal LV function, mild LVH, normal wall motion, G1DD, moderate RAE with mildly reduced RV systolic function, ventricular septal flattening (Septal-D sign) and paradoxical motion consistent with pressure and volume overload of the RV, severe RAE, moderate TR, RVSP 75 mmHg, dilated IVC (but no clear flow reversal in the hepatic vein), no pericardial effusion. BNP >4500.   Laboratory: Hepatitis panel negative. HIV nonreactive. FOBT negative C diff negative Ferritin 27, Fe 11, TIBC 428  She is a very difficult historian but mostly complains of abdominal pain and diarrhea. She also has chest pain at least 3-4 times? She mumbles and trails off and is difficult to follow. She has chronic LE edema which is stable. She has SOB. She wears 2L 02 at home. No orthopnea or PND. No dizziness or syncope. No blood in stool or urine.     Past Medical History:  Diagnosis Date  . Arthritis   . Chest pain   . CHF (congestive heart failure) ([place])   . Chronic kidney disease   . Diastolic dysfunction   . Dizziness   . Head ache   . Heart  murmur   . High blood pressure   . History of MRSA infection   . Renal insufficiency   . Shortness of breath      Past Surgical History:  Procedure Laterality Date  . APPENDECTOMY  1950  . CARDIAC CATHETERIZATION N/A 06/12/2015   Procedure: Left Heart Cath and Coronary Angiography;  Surgeon: Minna Merritts, MD;  Location: [facility];  Service: Cardiovascular;   Laterality: N/A;  . CAROTID ENDARTERECTOMY Left Dr. Lucky Cowboy  . CHOLECYSTECTOMY N/A 12/01/2015   Procedure: LAPAROSCOPIC CHOLECYSTECTOMY WITH INTRAOPERATIVE CHOLANGIOGRAM;  Surgeon: Hubbard Robinson, MD;  Location: [facility] ORS;  Service: General;  Laterality: N/A;  . foot and ankle surgery    . FRACTURE SURGERY    . JOINT REPLACEMENT    . knee replacement (other) Left 2002    Allergies  Allergen Reactions  . Atorvastatin Other (See Comments)    Other reaction(s): Muscle Pain  . Crestor [Rosuvastatin Calcium] Other (See Comments)    Other reaction(s): Muscle Pain  . Tape Other (See Comments)    Makes a bruise on my skin. Paper tape is ok to use.  . Pravastatin Sodium Other (See Comments)    Muscle pain    I have reviewed the patient's current medications . aspirin EC  81 mg Oral Daily  . Chlorhexidine Gluconate Cloth  6 each Topical Q0600  . clopidogrel  75 mg Oral q morning - 10a  . docusate sodium  100 mg Oral BID  . fluticasone furoate-vilanterol  1 puff Inhalation Daily  . heparin  5,000 Units Subcutaneous Q8H  . isosorbide mononitrate  30 mg Oral Daily  . levothyroxine  75 mcg Oral QAC breakfast  . metoprolol  50 mg Oral BID  . mupirocin ointment  1 application Nasal BID  . pantoprazole  40 mg Oral Daily  . sertraline  50 mg Oral QHS  . simvastatin  20 mg Oral q1800  . sodium bicarbonate  650 mg Oral TID  . sodium chloride flush  3 mL Intravenous Q12H    sodium chloride, acetaminophen **OR** acetaminophen, fentaNYL (SUBLIMAZE) injection, HYDROcodone-acetaminophen, ondansetron **OR** ondansetron (ZOFRAN) IV, sodium chloride flush, traZODone  Prior to Admission medications   Medication Sig Start Date End Date Taking? Authorizing Provider  ADVAIR DISKUS 250-50 MCG/DOSE AEPB INHALE ONE (1) PUFF EVERY 12 HOURS AS DIRECTED. RINSE MOUTH AFTER EACH USE. 08/11/16  Yes Birdie Sons, MD  albuterol (PROVENTIL HFA;VENTOLIN HFA) 108 (90 Base) MCG/ACT inhaler Inhale 1 puff into the  lungs every 6 (six) hours as needed for wheezing or shortness of breath.   Yes Historical Provider, MD  aspirin 81 MG tablet Take 81 mg by mouth every morning. Reported on 01/07/2016   Yes Historical Provider, MD  Cholecalciferol (VITAMIN D-3) 5000 units TABS Take 1 tablet by mouth daily.   Yes Historical Provider, MD  clopidogrel (PLAVIX) 75 MG tablet TAKE ONE TABLET BY MOUTH EVERY MORNING 12/16/16  Yes Birdie Sons, MD  fluticasone [facility]) 50 MCG/ACT nasal spray INHALE ONE TO TWO SPRAY IN EACH NOSTRIL DAILY AS NEEDED 12/19/16  Yes Birdie Sons, MD  HYDROcodone-acetaminophen (NORCO) 10-325 MG tablet Take 1 tablet by mouth every 4 (four) hours as needed for moderate pain or severe pain. 01/05/17  Yes Birdie Sons, MD  isosorbide mononitrate (IMDUR) 30 MG 24 hr tablet TAKE 1 TABLET BY MOUTH DAILY FOR HEART 12/16/16  Yes Minna Merritts, MD  levothyroxine (SYNTHROID, LEVOTHROID) 75 MCG tablet TAKE 1 TABLET BY MOUTH  DAILY (FOR THYROID) Patient taking differently: Take 75 mcg by mouth daily 04/13/16  Yes Birdie Sons, MD  meloxicam (MOBIC) 15 MG tablet TAKE ONE (1) TABLET BY MOUTH DAILY AS NEEDED FOR ARTHRITIS PAIN. 12/19/16  Yes Birdie Sons, MD  metoprolol (LOPRESSOR) 100 MG tablet Take 1 tablet (100 mg total) by mouth 2 (two) times daily. 03/08/16  Yes Minna Merritts, MD  nitroGLYCERIN (NITROSTAT) 0.4 MG SL tablet Place 1 tablet (0.4 mg total) under the tongue every 5 (five) minutes as needed for chest pain. 05/14/15  Yes Minna Merritts, MD  ondansetron (ZOFRAN ODT) 4 MG disintegrating tablet Take 1 tablet (4 mg total) by mouth every 8 (eight) hours as needed for nausea or vomiting. 12/11/16  Yes Carrie Mew, MD  pantoprazole (PROTONIX) 40 MG tablet Take 1 tablet (40 mg total) by mouth daily. 12/27/16  Yes Birdie Sons, MD  sertraline (ZOLOFT) 50 MG tablet Take 25 mg by mouth daily for mood Patient taking differently: Take 50 mg by mouth at bedtime.  07/05/16  Yes Modena Jansky, MD    simvastatin (ZOCOR) 20 MG tablet Take 20 mg by mouth daily 01/03/17  Yes Minna Merritts, MD  sodium bicarbonate 650 MG tablet Take 650 mg by mouth 3 (three) times daily.   Yes Historical Provider, MD  traZODone (DESYREL) 100 MG tablet TAKE ONE-HALF OR ONE TABLET BY MOUTH AT BEDTIME AS NEEDED FOR SLEEP. 12/16/16  Yes Birdie Sons, MD  albuterol (PROVENTIL) (2.5 MG/3ML) 0.083% nebulizer solution Take 3 mLs (2.5 mg total) by nebulization every 4 (four) hours as needed for wheezing or shortness of breath. Patient not taking: Reported on 01/06/2017 07/05/16   Modena Jansky, MD  amLODipine (NORVASC) 10 MG tablet Take 1 tablet (10 mg total) by mouth daily. Patient not taking: Reported on 01/06/2017 07/06/16   Modena Jansky, MD  LORazepam (ATIVAN) 0.5 MG tablet Take 0.5 tablets (0.25 mg total) by mouth every 12 (twelve) hours as needed for anxiety. Patient not taking: Reported on 01/06/2017 07/05/16   Modena Jansky, MD  mupirocin ointment (BACTROBAN) 2 % APPLY TO AFFECTED AREA(S) TWO TIMES PER DAY AS NEEDED Patient not taking: Reported on 01/06/2017 08/16/16   Birdie Sons, MD     Social History   Social History  . Marital status: Widowed    Spouse name: N/A  . Number of children: 4  . Years of education: 12   Occupational History  . retired    Social History Main Topics  . Smoking status: Former Smoker    Packs/day: 1.00    Years: 26.00    Types: Cigarettes    Quit date: 10/11/2011  . Smokeless tobacco: Never Used     Comment: Patient has smoked for 26 years; has quit but now started back 0.25 ppw.  . Alcohol use No  . Drug use: No  . Sexual activity: No   Other Topics Concern  . Not on file   Social History Narrative  . No narrative on file    Family Status  Relation Status  . Mother Deceased at age 27  . Brother Deceased  . Father Deceased at age 54   heart  . Sister Deceased at age 43  . Daughter Alive  . Son Alive  . Daughter Alive  . Daughter Alive   Family  History  Problem Relation Age of Onset  . Congestive Heart Failure Mother 5  . Cancer Brother   .  Kidney failure Father     ROS:  Full 14 point review of systems complete and found to be negative unless listed above.  Physical Exam: Blood pressure (!) 138/45, pulse 61, temperature 98.2 F (36.8 C), temperature source Oral, resp. rate 18, weight 222 lb 11.2 oz (101 kg), SpO2 100 %.  General: Well developed, well nourished, female in no acute distress, obese, chronically ill appearing Head: Eyes PERRLA, No xanthomas.   Normocephalic and atraumatic, oropharynx without edema or exudate. Lungs: Diminished breath sounds bilaterally with large body habitus.  Heart: HRRR S1 S2, no rub/gallop, Heart irregular rate and rhythm with S1, S2 soft murmur. pulses are 2+ extrem.   Neck: No carotid bruits. No lymphadenopathy. Difficult to assess JVD due to body habitus. Abdomen: Bowel sounds present, abdomen soft and non-tender without masses or hernias noted. Msk:  No spine or cva tenderness. No weakness, no joint deformities or effusions. Extremities: No clubbing or cyanosis. 2+ LE edema bilaterally.  Neuro: Alert and oriented X 3. No focal deficits noted. Psych:  Good affect, responds appropriately Skin: No rashes or lesions noted.  Labs:   Lab Results  Component Value Date   WBC 3.9 (L) 01/10/2017   HGB 8.2 (L) 01/10/2017   HCT 32.1 (L) 01/10/2017   MCV 81.7 01/10/2017   PLT 120 (L) 01/10/2017   No results for input(s): INR in the last 72 hours.  Recent Labs Lab 01/09/17 0323 01/10/17 0422  NA 144 146*  K 4.0 4.0  CL 105 103  CO2 31 34*  BUN 28* 21*  CREATININE 1.59* 1.28*  CALCIUM 8.5* 8.9  PROT 5.7*  --   BILITOT 0.9  --   ALKPHOS 101  --   ALT 39  --   AST 40  --   GLUCOSE 104* 100*  ALBUMIN 3.5  --    Magnesium  Date Value Ref Range Status  07/01/2016 2.0 1.7 - 2.4 mg/dL Final   No results for input(s): CKTOTAL, CKMB, TROPONINI in the last 72 hours. No results for  input(s): TROPIPOC in the last 72 hours. No results found for: PROBNP Lab Results  Component Value Date   CHOL 181 12/12/2014   HDL 43 12/12/2014   LDLCALC 94 12/12/2014   TRIG 218 (A) 12/12/2014   No results found for: DDIMER Lipase  Date/Time Value Ref Range Status  01/06/2017 07:22 AM 27 11 - 51 U/L Final   TSH  Date/Time Value Ref Range Status  07/01/2016 06:55 AM 0.639 0.350 - 4.500 uIU/mL Final  04/27/2015 09:40 AM 0.951 0.450 - 4.500 uIU/mL Final   Ferritin  Date/Time Value Ref Range Status  01/08/2017 03:33 AM 27 11 - 307 ng/mL Final   TIBC  Date/Time Value Ref Range Status  01/08/2017 03:33 AM 428 250 - 450 ug/dL Final   Iron  Date/Time Value Ref Range Status  01/08/2017 03:33 AM 11 (L) 28 - 170 ug/dL Final    Echo: 01/08/2017 Study Conclusions - Procedure narrative: Transthoracic echocardiography. Image   quality was adequate. The study was technically difficult, as a   result of body habitus. - Left ventricle: The cavity size was normal. Wall thickness was   increased in a pattern of mild LVH. Systolic function was normal.   The estimated ejection fraction was in the range of 60% to 65%.   Wall motion was normal; there were no regional wall motion   abnormalities. Doppler parameters are consistent with abnormal   left ventricular relaxation (grade 1 diastolic dysfunction).  The   Ee&' ratio is between 8-15, suggesting indeterminate LV filling   pressure. - Ventricular septum: Septal motion showed paradox. The contour   showed diastolic flattening and systolic flattening. This   suggests pressure and volume overload of the RV. - Mitral valve: Calcified annulus. Mildly thickened leaflets .   There was trivial regurgitation. - Left atrium: The atrium was normal in size. - Right ventricle: The cavity size was moderately dilated. Mildly   reduced systolic function. - Right atrium: Severely dilated. - Tricuspid valve: There was moderate regurgitation. -  Pulmonary arteries: PA peak pressure: 75 mm Hg (S). - Inferior vena cava: The vessel was dilated. The respirophasic   diameter changes were blunted (< 50%), consistent with elevated   central venous pressure. Impressions: - Technically difficult study. LVEF 60-65%, mild LVH, normal wall   motion, grade 1 DD with indeterminate LV filling pressure,   moderate RAE with mildly reduced RV systolic function,   ventricular septal flattening (Septal-D sign) and paradoxical   motion consistent with pressure and volume overload of the RV,   severe RAE, moderate TR, RVSP 75 mmHg, dilated IVC (but no clear   flow reversal in the hepatic vein), no pericardial effusion.  ECG: Sinus bradycardia, LBBB  - personally reviewed  TELE: NSR, sinus brady, LBBB , PVCs - personally reviewed  Radiology:  No results found.  ASSESSMENT AND PLAN:    Active Problems:   Decreased oral intake   AKI (acute kidney injury) ([place])   Diarrhea   Moderate malnutrition ([facility])  TIFFAY PINETTE is a 81 y.o. female with a history of HTN, tobacco abuse, atrial tachycardia, chronic anemia, carotid artery disease s/p L CEA, PAD, COPD on 2L home 02, LBBB, non obst CAD, CKD, chronic diastolic CHF and chronic LE edema who presented to [facility] on 01/06/17 with abdominal pain, N/V, decreased PO intake. Cardiology asked to consult given abnormal echocardiogram.   Right heart failure with severe pulm HTN: 2D ECHO showed normal LV function, mild LVH, normal wall motion, G1DD, moderate RAE with mildly reduced RV systolic function, ventricular septal flattening (Septal-D sign) and paradoxical motion consistent with pressure and volume overload of the RV, severe RAE, moderate TR, RVSP 75 mmHg, dilated IVC (but no clear flow reversal in the hepatic vein), no pericardial effusion.  -- This could be cor pulmonale secondary to longstanding COPD and hypoxemia. However, pulm HTN up from normal in 2015 to 75. Will plan for V/Q scan to rule out CTEPH given  significantly elevated pulmonary pressures that seem out of proportion to COPD. ? Hepatopulmonary pulmonary HTN -- Plan was to diruese her given significant elevation in BNP (>4500). However, she had AKI with creat up to 3.49 that improved with IV hydration. Will plan for an arterial blood gas and right heart cath tomorrow at 9am to get a better feel of right heart and pulmonary pressures.   AKI: creat improving 3.49 --> 1.28 after IVFs. She has had poor PO intake and diarrhea.   HTN: BP has been mild to moderately elevated  Ectatic abdominal aorta: noted on recent CT scan. At risk for aneurysm development (recommend followup by ultrasound in 5 years)  Chronic anemia: H/H mildly decreased, likely from hydration 9.1/34.5--> 8.2/32.1  COPD: continue 02. She has quit smoking  Carotid artery disease s/p L CEA, non obstructive CAD, PAD: continue ASA/plavix and statin   Elevated troponin: low and flat c/w demand ischemia. Cath in 2015 with 50% LAD and otherwise normal cors  Signed: Angelena Form, PA-C 01/10/2017 12:15 PM  Pager [phone]  Co-Sign MD  Personally seen and examined. Agree with above.  81 year old with COPD, peripheral vascular disease with newly discovered right heart failure, pulmonary hypertension with recent acute kidney injury  Pulmonary hypertension, secondary  - We will check a VQ scan for any evidence of chronic thromboembolic disease  - We will check a blood gas to see if she has any evidence of chronic CO2 retention  - Likely multifactorial from COPD, obesity, chronic diastolic heart failure  - We will hold off on diuresis given her recent acute kidney injury requiring gentle IV fluids.  - Her symptoms of stomach discomfort, nausea certainly could be secondary to elevated right-sided heart pressures.  - Tomorrow, we will set her up for right heart catheterization with Dr. Haroldine Laws to check her pulmonary pressures, cardiac output.  - Has moderate coronary  artery disease by left heart catheterization in 2015.  Physical exam-elderly, obese, deconditioned sitting on edge of bed, increased respiratory effort, elevated JVD, minimal crackles in lungs bilaterally, mild lower extremity edema.  Candee Furbish, MD

## 2017-01-10 NOTE — Progress Notes (Signed)
Family Medicine Teaching Service Daily Progress Note Intern Pager: [phone]  Patient name: Carolyn Hendricks Medical record number: [id number] Date of birth: 1936/02/17 Age: 81 y.o. Gender: female  Primary Care Provider: [name], MD Consultants: None Code Status: Full   Pt Overview and Major Events to Date:  3/30 - Admitted to FPTS for inability to tolerate PO  Carolyn Hendricks is a 81 y.o. female presenting with decreased po intake and abdominal pain. PMH is significant for CAD, asthma,  HTN, hypothyroidism with goiter, CKDIII, DDD with spinal stenosis of lumbar region, CHF, HLD, anxiety, acute renal failure, obesity and history of tobacco use. Has had cholecystectomy and appendectomy.    CHF, right heart failure:   Thought to be core pulmonale 2/2 COPD. Echo with Right atrial dilation, with septal motion paradox consistent with RV volume overload, IVC dilatation.  - I/Os, daily weights - up 10 lbs this admission - cardiology consult - plan for VQ scan, right heart cath plan for 4/4 - hold of diurese given R heart failure and AKI - daily weights  - beta blocker decreased for bradycardia  Abdominal pain/nausea/vomiting/decreased po intake, improved:  Uncertain etiology, considering gastroenteritis. Hepatitis less likely with negative Hep panel.  -GI panel by stool collected and pending per RN >>not received at lab, plan to recollect today -Tylenol 650 mg Q6 prn for pain  - Zofran 4 mg Q6 prn for nausea   Acute on chronic Renal Failure, improving:  SCr 1.28 from 3.49 on admission. Likely dehydration from decreased PO over last several days. Baseline Cr ~0.9.  With CKD III.  - fluids held now tolerating normal diet - AM BMET   Anemia acute on chronic: Hgb trending down, 8.2 today. Iron low at 11, TIBC WNL, saturation low at 3%. Consistent with anemia of chronic disease.  Last colonoscopy in 11/2013 (Dr. Rayann Heman) small and large mouth diverticula in sigmoid, internal hemorrhoids, 4 mm  polyp path-TA negative HGD.  - Start FeSO4 supplementation 325 mg QD - transfusion threshold 8 - recheck AM CBC - add-on reticulocytes  Weakness: likely related to poor po intake although degenerative disc disease is probably contributing to history of falls.  Has had recent fall but did not hit head.  No LOC.   - PT/OT : SNF  CAD: Troponins have been elevated to 0.09 in the past. Troponins trended down 0.12 > 0.09.   - continue home aspirin, Zocor 20 mg and Plavix   Urinary symptoms: Initial Reports increased frequency, urgency and dysuria.  UA with trace leuks. Gram stain with gram variable rods and gram positive cocci in pairs. Urine cx: multiple species. - monitor, consider treating if patient complains of symptoms - Continue to monitor   Asthma, stable:  At home on Advair diskus.  Not on formulary but ordered as Breo Ellipta.  -Continue Breo Ellipta 1 puff daily   HTN, controlled.  At home on Imdur 30 mg daily, Lopressor 100 mg BID, and Norvasc 10 mg daily .   -Continue home Imdur and Lopressor.   -Hold Norvasc for now in setting of leg swelling  -Monitor blood pressures  Hypothyroidism, stable.  Last TSH 0.639 and wnl, on 07/01/2016.  At home on Synthroid.  -Continue home Synthroid 75 mcg   DDD with spinal stenosis of lumbar region, stable.  Complaining of back pain on admission but this is not new.  Denies acute symptoms.  At home on Norco 10-325 Q4 PRN.   -Continue home pain med  Anxiety/Depression:  At home on Zoloft.  Per daughter, used to take Ativan but no longer is on it.  -Continue Zoloft 50 mg po at bedtime   CKD III:  Follows with [facility] home sodium bicarb 650 mg daily    Insomnia.  At home on Trazodone 100 mg prn at bedtime.  -Continue home Trazodone   FEN/GI: Heart healthy diet Prophylaxis: Heparin, Protonix   Disposition: Admit to med-surg for IV hydration and pain control, attending Dr. Nori Hendricks; patient interested in going  back to [facility] (SNF) but awaiting PT/OT recs   Subjective:  No acute events overnight. Patient endorses having difficulty sleeping last night.  Comfortable on RA this AM.  Objective: Temp:  [97.9 F (36.6 C)-98.2 F (36.8 C)] 98 F (36.7 C) (04/03 1431) Pulse Rate:  [61-118] 67 (04/03 1431) Resp:  [18-20] 20 (04/03 1431) BP: (138-172)/(45-91) 149/75 (04/03 1431) SpO2:  [94 %-100 %] 97 % (04/03 1431) Weight:  [101 kg (222 lb 11.2 oz)] 101 kg (222 lb 11.2 oz) (04/03 0500) Physical Exam: General: Elderly female, sitting up about to have breakfast, NAD Cardiovascular: RRR, no m/r/g, no JVD Respiratory: Diminished breath sounds bilaterally with large body habitus. Appears comfortable. Speaks easily in complete sentences. Normal WOB on 3L [place] Abdomen: soft, obese, Very mild TTP to epigastric area, otherwise nontender. No distention. +BS Extremities: +2-3 LE edema bilaterally with 2+ pedal edema. Moves all spontaneously.   Laboratory: Hepatitis panel negative. HIV nonreactive. FOBT negative C diff negative Ferritin 27, Fe 11, TIBC 428  Hepatic Function Latest Ref Rng & Units 01/09/2017 01/08/2017 01/06/2017  Total Protein 6.5 - 8.1 g/dL 5.7(L) 5.6(L) 6.3(L)  Albumin 3.5 - 5.0 g/dL 3.5 3.5 3.9  AST 15 - 41 U/L 40 51(H) 122(H)  ALT 14 - 54 U/L 39 48 79(H)  Alk Phosphatase 38 - 126 U/L 101 117 168(H)  Total Bilirubin 0.3 - 1.2 mg/dL 0.9 0.9 1.7(H)     Recent Labs Lab 01/08/17 0333 01/09/17 0323 01/10/17 0422  WBC 5.6 4.9 3.9*  HGB 8.6* 8.4* 8.2*  HCT 33.8* 33.2* 32.1*  PLT 133* 121* 120*    Recent Labs Lab 01/06/17 0722  01/08/17 0333 01/09/17 0323 01/10/17 0422  NA 138  < > 142 144 146*  K 4.7  < > 4.3 4.0 4.0  CL 96*  < > 103 105 103  CO2 30  < > 33* 31 34*  BUN 55*  < > 35* 28* 21*  CREATININE 3.49*  < > 1.86* 1.59* 1.28*  CALCIUM 9.3  < > 8.6* 8.5* 8.9  PROT 6.3*  --  5.6* 5.7*  --   BILITOT 1.7*  --  0.9 0.9  --   ALKPHOS 168*  --  117 101  --   ALT 79*  --  48  39  --   AST 122*  --  51* 40  --   GLUCOSE 87  < > 140* 104* 100*  < > = values in this interval not displayed.  Imaging/Diagnostic Tests:  Dg Chest Port 1 View 01/06/2017 FINDINGS: There is airspace consolidation with volume loss the right lower lobe. There is a right pleural effusion. Left lung is clear. Heart is enlarged with pulmonary vascularity within normal limits. No adenopathy. There is atherosclerotic calcification in the aorta. There is degenerative change in the shoulders.  IMPRESSION: Airspace consolidation with volume loss in the right lower lobe. Suspect pneumonia. There is cardiomegaly with small right pleural effusion. No frank edema.  Left lung clear. There is aortic atherosclerosis.   Ct Abdomen Pelvis W Contrast 12/11/2016  IMPRESSION:  1. Nonspecific presacral stranding, with question of underlying soft tissue inflammation about the rectum. Would correlate clinically for any evidence of proctitis.  2. Mildly nodular contour of the liver raises concern for mild hepatic cirrhosis. Trace ascites noted about the liver.  3. Small right and trace left pleural effusions. Right basilar atelectasis noted.  4. Mild cardiomegaly.  5. Focal scarring at the midportion of the right kidney.  6. Scattered diverticulosis along the sigmoid colon, without evidence of diverticulitis.  7. Ectatic abdominal aorta at risk for aneurysm development. Recommend followup by ultrasound in 5 years. This recommendation follows ACR consensus guidelines: White Paper of the ACR Incidental Findings Committee II on Vascular Findings. J Am Coll Radiol 2013; 10:789-794. 8. Likely mild luminal narrowing along the common iliac arteries bilaterally, secondary to calcific atherosclerotic disease. Scattered aortic atherosclerosis.  9. Mild right convex thoracolumbar scoliosis. Partial chronic osseous fusion at L1-L2.  10. Multiple calcified uterine fibroids noted.8   Cardiac Echo Study Conclusions - Procedure  narrative: Transthoracic echocardiography. Image   quality was adequate. The study was technically difficult, as a   result of body habitus. - Left ventricle: The cavity size was normal. Wall thickness was   increased in a pattern of mild LVH. Systolic function was normal.   The estimated ejection fraction was in the range of 60% to 65%.   Wall motion was normal; there were no regional wall motion   abnormalities. Doppler parameters are consistent with abnormal   left ventricular relaxation (grade 1 diastolic dysfunction). The   Ee&' ratio is between 8-15, suggesting indeterminate LV filling   pressure. - Ventricular septum: Septal motion showed paradox. The contour   showed diastolic flattening and systolic flattening. This   suggests pressure and volume overload of the RV. - Mitral valve: Calcified annulus. Mildly thickened leaflets .   There was trivial regurgitation. - Left atrium: The atrium was normal in size. - Right ventricle: The cavity size was moderately dilated. Mildly   reduced systolic function. - Right atrium: Severely dilated. - Tricuspid valve: There was moderate regurgitation. - Pulmonary arteries: PA peak pressure: 75 mm Hg (S). - Inferior vena cava: The vessel was dilated. The respirophasic   diameter changes were blunted (< 50%), consistent with elevated   central venous pressure.  Impressions: - Technically difficult study. LVEF 60-65%, mild LVH, normal wall   motion, grade 1 DD with indeterminate LV filling pressure,   moderate RAE with mildly reduced RV systolic function,   ventricular septal flattening (Septal-D sign) and paradoxical   motion consistent with pressure and volume overload of the RV,   severe RAE, moderate TR, RVSP 75 mmHg, dilated IVC (but no clear   flow reversal in the hepatic vein), no pericardial effusion.   Everrett Coombe, MD 01/10/2017, 3:07 PM PGY-1, [place] Intern pager: [phone], text pages welcome

## 2017-01-10 NOTE — Clinical Social Work Note (Signed)
CSW met with pt and daughter-Debra at bedside to address consult for SNF placement. Pt from home with her daughter, but daughter also cares for disabled sister and brother. Pt in agreement with SNF for STR. Pt preference 1. [facility], 2. [facility], 3. [facility], but pt in agreement with being faxed out to other [facility] SNFs. CSW assessment to follow. CSW continues to follow for d/c needs.  Oretha Ellis, [place], [place] Work [phone]

## 2017-01-11 ENCOUNTER — Inpatient Hospital Stay (HOSPITAL_COMMUNITY): Payer: Medicare Other

## 2017-01-11 ENCOUNTER — Encounter (HOSPITAL_COMMUNITY)
Admission: EM | Disposition: A | Discharge status: Skilled Nursing Facility | Payer: Self-pay | Source: Home / Self Care | Attending: Family Medicine

## 2017-01-11 ENCOUNTER — Encounter (HOSPITAL_COMMUNITY): Payer: Self-pay | Admitting: Internal Medicine

## 2017-01-11 DIAGNOSIS — I272 Pulmonary hypertension, unspecified: Secondary | ICD-10-CM

## 2017-01-11 DIAGNOSIS — J449 Chronic obstructive pulmonary disease, unspecified: Secondary | ICD-10-CM

## 2017-01-11 DIAGNOSIS — I2723 Pulmonary hypertension due to lung diseases and hypoxia: Secondary | ICD-10-CM

## 2017-01-11 DIAGNOSIS — I2721 Secondary pulmonary arterial hypertension: Secondary | ICD-10-CM

## 2017-01-11 DIAGNOSIS — R401 Stupor: Secondary | ICD-10-CM

## 2017-01-11 DIAGNOSIS — J9602 Acute respiratory failure with hypercapnia: Secondary | ICD-10-CM

## 2017-01-11 HISTORY — PX: RIGHT HEART CATH: CATH118263

## 2017-01-11 LAB — BASIC METABOLIC PANEL
Anion gap: 7 (ref 5–15)
BUN: 15 mg/dL (ref 6–20)
CALCIUM: 9.2 mg/dL (ref 8.9–10.3)
CHLORIDE: 105 mmol/L (ref 101–111)
CO2: 33 mmol/L — AB (ref 22–32)
CREATININE: 1.05 mg/dL — AB (ref 0.44–1.00)
GFR calc non Af Amer: 49 mL/min — ABNORMAL LOW (ref >60)
GFR, EST AFRICAN AMERICAN: 57 mL/min — AB (ref >60)
Glucose, Bld: 111 mg/dL — ABNORMAL HIGH (ref 65–99)
Potassium: 4 mmol/L (ref 3.5–5.1)
SODIUM: 145 mmol/L (ref 135–145)

## 2017-01-11 LAB — CULTURE, BLOOD (ROUTINE X 2)
Culture: NO GROWTH
Culture: NO GROWTH

## 2017-01-11 LAB — BLOOD GAS, ARTERIAL
ACID-BASE EXCESS: 6.2 mmol/L — AB (ref 0.0–2.0)
Acid-Base Excess: 6.2 mmol/L — ABNORMAL HIGH (ref 0.0–2.0)
BICARBONATE: 31.6 mmol/L — AB (ref 20.0–28.0)
Bicarbonate: 31.7 mmol/L — ABNORMAL HIGH (ref 20.0–28.0)
Drawn by: 313941
Drawn by: 280981
O2 CONTENT: 2 L/min
O2 Content: 2 L/min
O2 Saturation: 96.4 %
O2 Saturation: 96.6 %
PATIENT TEMPERATURE: 98.6
PCO2 ART: 57.8 mmHg — AB (ref 32.0–48.0)
PH ART: 7.355 (ref 7.350–7.450)
PO2 ART: 80.9 mmHg — AB (ref 83.0–108.0)
Patient temperature: 98
pCO2 arterial: 60 mmHg — ABNORMAL HIGH (ref 32.0–48.0)
pH, Arterial: 7.343 — ABNORMAL LOW (ref 7.350–7.450)
pO2, Arterial: 88.7 mmHg (ref 83.0–108.0)

## 2017-01-11 LAB — COMPREHENSIVE METABOLIC PANEL
ALBUMIN: 3.1 g/dL — AB (ref 3.5–5.0)
ALT: 25 U/L (ref 14–54)
ANION GAP: 8 (ref 5–15)
AST: 32 U/L (ref 15–41)
Alkaline Phosphatase: 67 U/L (ref 38–126)
BILIRUBIN TOTAL: 1 mg/dL (ref 0.3–1.2)
BUN: 16 mg/dL (ref 6–20)
CHLORIDE: 108 mmol/L (ref 101–111)
CO2: 29 mmol/L (ref 22–32)
Calcium: 8.6 mg/dL — ABNORMAL LOW (ref 8.9–10.3)
Creatinine, Ser: 1.07 mg/dL — ABNORMAL HIGH (ref 0.44–1.00)
GFR calc Af Amer: 56 mL/min — ABNORMAL LOW (ref >60)
GFR, EST NON AFRICAN AMERICAN: 48 mL/min — AB (ref >60)
GLUCOSE: 98 mg/dL (ref 65–99)
POTASSIUM: 5.2 mmol/L — AB (ref 3.5–5.1)
Sodium: 145 mmol/L (ref 135–145)
TOTAL PROTEIN: 4.7 g/dL — AB (ref 6.5–8.1)

## 2017-01-11 LAB — POCT I-STAT 3, VENOUS BLOOD GAS (G3P V)
ACID-BASE EXCESS: 7 mmol/L — AB (ref 0.0–2.0)
Acid-Base Excess: 9 mmol/L — ABNORMAL HIGH (ref 0.0–2.0)
BICARBONATE: 34.7 mmol/L — AB (ref 20.0–28.0)
Bicarbonate: 36.3 mmol/L — ABNORMAL HIGH (ref 20.0–28.0)
O2 SAT: 39 %
O2 Saturation: 43 %
PCO2 VEN: 69.5 mmHg — AB (ref 44.0–60.0)
PH VEN: 7.326 (ref 7.250–7.430)
PO2 VEN: 26 mmHg — AB (ref 32.0–45.0)
PO2 VEN: 27 mmHg — AB (ref 32.0–45.0)
TCO2: 37 mmol/L (ref 0–100)
TCO2: 38 mmol/L (ref 0–100)
pCO2, Ven: 68.8 mmHg — ABNORMAL HIGH (ref 44.0–60.0)
pH, Ven: 7.311 (ref 7.250–7.430)

## 2017-01-11 LAB — GLUCOSE, CAPILLARY
GLUCOSE-CAPILLARY: 87 mg/dL (ref 65–99)
Glucose-Capillary: 83 mg/dL (ref 65–99)
Glucose-Capillary: 83 mg/dL (ref 65–99)

## 2017-01-11 LAB — CBC
HCT: 33.3 % — ABNORMAL LOW (ref 36.0–46.0)
HEMOGLOBIN: 8.6 g/dL — AB (ref 12.0–15.0)
MCH: 20.7 pg — ABNORMAL LOW (ref 26.0–34.0)
MCHC: 25.8 g/dL — AB (ref 30.0–36.0)
MCV: 80.2 fL (ref 78.0–100.0)
Platelets: 138 10*3/uL — ABNORMAL LOW (ref 150–400)
RBC: 4.15 MIL/uL (ref 3.87–5.11)
RDW: 19.5 % — AB (ref 11.5–15.5)
WBC: 5.7 10*3/uL (ref 4.0–10.5)

## 2017-01-11 LAB — MAGNESIUM: Magnesium: 1.6 mg/dL — ABNORMAL LOW (ref 1.7–2.4)

## 2017-01-11 LAB — LACTIC ACID, PLASMA: LACTIC ACID, VENOUS: 1.4 mmol/L (ref 0.5–1.9)

## 2017-01-11 LAB — TROPONIN I: Troponin I: 0.06 ng/mL (ref <0.03)

## 2017-01-11 LAB — PROTIME-INR
INR: 1.18
Prothrombin Time: 15.1 seconds (ref 11.4–15.2)

## 2017-01-11 LAB — AMMONIA: Ammonia: 37 umol/L — ABNORMAL HIGH (ref 9–35)

## 2017-01-11 SURGERY — RIGHT HEART CATH
Anesthesia: LOCAL

## 2017-01-11 MED ORDER — LIDOCAINE HCL (PF) 1 % IJ SOLN
INTRAMUSCULAR | Status: DC | PRN
  Administered 2017-01-11: 2 mL via SUBCUTANEOUS

## 2017-01-11 MED ORDER — SODIUM CHLORIDE 0.9% FLUSH: 3.0000 mL | INTRAVENOUS | Status: DC | PRN

## 2017-01-11 MED ORDER — ONDANSETRON HCL 4 MG/2ML IJ SOLN: 4.0000 mg | Freq: Four times a day (QID) | INTRAMUSCULAR | Status: DC | PRN

## 2017-01-11 MED ORDER — HYDRALAZINE HCL 20 MG/ML IJ SOLN: 5.0000 mg | INTRAMUSCULAR | Status: DC | PRN

## 2017-01-11 MED ORDER — SODIUM CHLORIDE 0.9 % IV SOLN: 250.0000 mL | INTRAVENOUS | Status: DC | PRN

## 2017-01-11 MED ORDER — FUROSEMIDE 10 MG/ML IJ SOLN: 80.0000 mg | Freq: Two times a day (BID) | INTRAMUSCULAR | Status: DC

## 2017-01-11 MED ORDER — FUROSEMIDE 10 MG/ML IJ SOLN
80.0000 mg | Freq: Once | INTRAMUSCULAR | Status: AC
  Administered 2017-01-11: 80 mg via INTRAVENOUS
  Filled 2017-01-11: qty 8

## 2017-01-11 MED ORDER — SODIUM CHLORIDE 0.9 % IV SOLN
INTRAVENOUS | Status: DC | PRN
  Administered 2017-01-11: 10 mL/h via INTRAVENOUS

## 2017-01-11 MED ORDER — HEPARIN (PORCINE) IN NACL 2-0.9 UNIT/ML-% IJ SOLN
INTRAMUSCULAR | Status: AC
  Filled 2017-01-11: qty 1000

## 2017-01-11 MED ORDER — HEPARIN (PORCINE) IN NACL 2-0.9 UNIT/ML-% IJ SOLN
INTRAMUSCULAR | Status: DC | PRN
  Administered 2017-01-11: 1000 mL

## 2017-01-11 MED ORDER — IPRATROPIUM-ALBUTEROL 0.5-2.5 (3) MG/3ML IN SOLN
3.0000 mL | RESPIRATORY_TRACT | Status: AC
Start: 2017-01-11 — End: 2017-01-11
  Administered 2017-01-11: 3 mL via RESPIRATORY_TRACT
  Filled 2017-01-11: qty 3

## 2017-01-11 MED ORDER — MAGNESIUM SULFATE 2 GM/50ML IV SOLN
2.0000 g | Freq: Once | INTRAVENOUS | Status: AC
  Administered 2017-01-11: 2 g via INTRAVENOUS
  Filled 2017-01-11: qty 50

## 2017-01-11 MED ORDER — FUROSEMIDE 10 MG/ML IJ SOLN
80.0000 mg | Freq: Two times a day (BID) | INTRAMUSCULAR | Status: DC
  Administered 2017-01-11 – 2017-01-14 (×6): 80 mg via INTRAVENOUS
  Filled 2017-01-11 (×6): qty 8

## 2017-01-11 MED ORDER — SODIUM CHLORIDE 0.9% FLUSH: 3.0000 mL | Freq: Two times a day (BID) | INTRAVENOUS | Status: DC

## 2017-01-11 MED ORDER — ACETAMINOPHEN 325 MG PO TABS: 650.0000 mg | ORAL_TABLET | ORAL | Status: DC | PRN

## 2017-01-11 MED ORDER — LIDOCAINE HCL (PF) 1 % IJ SOLN
INTRAMUSCULAR | Status: AC
  Filled 2017-01-11: qty 30

## 2017-01-11 SURGICAL SUPPLY — 9 items
CATH BALLN WEDGE 5F 110CM (CATHETERS) ×1 IMPLANT
HOVERMATT SINGLE USE (MISCELLANEOUS) ×1 IMPLANT
PACK CARDIAC CATHETERIZATION (CUSTOM PROCEDURE TRAY) ×2 IMPLANT
PROTECTION STATION PRESSURIZED (MISCELLANEOUS) ×2
SHEATH GLIDE SLENDER 4/5FR (SHEATH) ×1 IMPLANT
STATION PROTECTION PRESSURIZED (MISCELLANEOUS) IMPLANT
TRANSDUCER W/STOPCOCK (MISCELLANEOUS) ×2 IMPLANT
TUBING ART PRESS 72  MALE/FEM (TUBING) ×1
TUBING ART PRESS 72 MALE/FEM (TUBING) IMPLANT

## 2017-01-11 NOTE — Progress Notes (Signed)
Documentation:   Late Entry:   Received call from nursing regarding a rapid response. Patient was noted to be drowsy after coming back from her right heart cath. She was noted to have significantly elevated BP while in cath but this had improved. She had 100% o2 saturation on her home 2L [place]. She seemed to have increased work of breathing. Heart sounds were RRR. Lungs exam with decreased breath sounds at the bases bilaterally (anteriorly) with mild crackles at the bases without wheezing. Stat EKG, CBC, CMP, lactic acid, ABG, trop, CXR.  Placed patient on BiPap and transfer to step down for close monitoring. Called CCM to discuss. Recommended continuing current management and if worsens on bipap, will call again. Informed Dr. Haroldine Laws as well.  CXR: persistent effusions, right more than left. Atelectasis at the right lung base. No significant change since the prior exam.   Smiley Houseman, MD  PGY 2 Family Medicine

## 2017-01-11 NOTE — Progress Notes (Signed)
Patient returned to 6n08, very drowsy, alert to self, BP has gone down to normal. [place] dressing to left [facility] where procedure was done, feels slighly cool to touch. Placed back on telemetry and oxygen. Patient displays what looks like labored and use of accessory muscle breathing but VSS. Will notify Rapid Response just to lay eyes on her and notify MD she has returned and looks a little worse than when she left for cardiac cath procedure. Will continue to monitor.

## 2017-01-11 NOTE — Progress Notes (Signed)
Patient Name: Carolyn Hendricks Date of Encounter: 01/11/2017  Primary Cardiologist: Dr. Rockey Situ Dr. Fletcher Anon (PAD)  Hospital Problem List     Active Problems:   Decreased oral intake   AKI (acute kidney injury) ([place])   Diarrhea   Moderate malnutrition (HCC)   Chronic diarrhea of unknown origin   Nausea and vomiting     Subjective   Feeling a little better today. Still with abdominal pain.   Inpatient Medications    Scheduled Meds: . aspirin EC  81 mg Oral Daily  . cholestyramine  4 g Oral Daily  . clopidogrel  75 mg Oral q morning - 10a  . docusate sodium  100 mg Oral BID  . ferrous sulfate  325 mg Oral Daily  . fluticasone furoate-vilanterol  1 puff Inhalation Daily  . heparin  5,000 Units Subcutaneous Q8H  . isosorbide mononitrate  30 mg Oral Daily  . levothyroxine  75 mcg Oral QAC breakfast  . metoprolol  50 mg Oral BID  . mupirocin ointment  1 application Nasal BID  . pantoprazole  40 mg Oral Daily  . sertraline  50 mg Oral QHS  . simvastatin  20 mg Oral q1800  . sodium bicarbonate  650 mg Oral TID  . sodium chloride flush  3 mL Intravenous Q12H  . sodium chloride flush  3 mL Intravenous Q12H   Continuous Infusions: . sodium chloride 10 mL/hr at 01/11/17 0609   PRN Meds: sodium chloride, sodium chloride, acetaminophen **OR** acetaminophen, fentaNYL (SUBLIMAZE) injection, HYDROcodone-acetaminophen, ondansetron **OR** ondansetron (ZOFRAN) IV, sodium chloride flush, sodium chloride flush, technetium TC 41M diethylenetriame-pentaacetic acid, traZODone   Vital Signs    Vitals:   01/10/17 1557 01/10/17 2012 01/11/17 0207 01/11/17 0826  BP:  (!) 157/65 (!) 165/93   Pulse:  73 70   Resp:  20    Temp:  98.2 F (36.8 C)    TempSrc:  Oral    SpO2:  93% 100% 100%  Weight:      Height: 5' (1.524 m)       Intake/Output Summary (Last 24 hours) at 01/11/17 0844 Last data filed at 01/10/17 2230  Gross per 24 hour  Intake              480 ml  Output                0  ml  Net              480 ml   Filed Weights   01/08/17 0418 01/09/17 0834 01/10/17 0500  Weight: 202 lb (91.6 kg) 222 lb 4.8 oz (100.8 kg) 222 lb 11.2 oz (101 kg)    Physical Exam    General: Well developed, well nourished, female in no acute distress, obese, chronically ill appearing Head: Eyes PERRLA, No xanthomas.   Normocephalic and atraumatic, oropharynx without edema or exudate. Lungs: Diminished breath sounds bilaterally with large body habitus.  Heart: HRRR S1 S2, no rub/gallop, Heart irregular rate and rhythm with S1, S2 soft murmur. pulses are 2+ extrem.   Neck: No carotid bruits. No lymphadenopathy. Difficult to assess JVD due to body habitus. Abdomen: Bowel sounds present, abdomen soft and non-tender without masses or hernias noted. Msk:  No spine or cva tenderness. No weakness, no joint deformities or effusions. Extremities: No clubbing or cyanosis. 2+ LE edema bilaterally.  Neuro: Alert and oriented X 3. No focal deficits noted. Psych:  Good affect, responds appropriately Skin: No rashes or lesions noted.  Labs    CBC  Recent Labs  01/10/17 0422 01/11/17 0417  WBC 3.9* 5.7  HGB 8.2* 8.6*  HCT 32.1* 33.3*  MCV 81.7 80.2  PLT 120* 536*   Basic Metabolic Panel  Recent Labs  01/10/17 0422 01/11/17 0417  NA 146* 145  K 4.0 4.0  CL 103 105  CO2 34* 33*  GLUCOSE 100* 111*  BUN 21* 15  CREATININE 1.28* 1.05*  CALCIUM 8.9 9.2   Liver Function Tests  Recent Labs  01/09/17 0323  AST 40  ALT 39  ALKPHOS 101  BILITOT 0.9  PROT 5.7*  ALBUMIN 3.5   No results for input(s): LIPASE, AMYLASE in the last 72 hours. Cardiac Enzymes No results for input(s): CKTOTAL, CKMB, CKMBINDEX, TROPONINI in the last 72 hours. BNP Invalid input(s): POCBNP D-Dimer No results for input(s): DDIMER in the last 72 hours. Hemoglobin A1C No results for input(s): HGBA1C in the last 72 hours. Fasting Lipid Panel No results for input(s): CHOL, HDL, LDLCALC, TRIG, CHOLHDL,  LDLDIRECT in the last 72 hours. Thyroid Function Tests  Recent Labs  01/10/17 1848  TSH 1.881    Telemetry    nsr with sinus bradycardia with LBBB and sinus pauses, also ~ 15 beat run NSVT - Personally Reviewed  ECG    Sinus bradycardia HR 56, LBBB - Personally Reviewed  Radiology    Dg Chest 2 View  Result Date: 01/10/2017 CLINICAL DATA:  81 year old with chronic cough, shortness of breath and mid chest pain over the past several weeks. Current history of congestive heart failure and stage 3 chronic kidney disease. EXAM: CHEST  2 VIEW COMPARISON:  01/06/2017, 07/02/2016 and earlier. FINDINGS: Cardiac silhouette markedly enlarged, unchanged. Thoracic aorta tortuous and atherosclerotic, unchanged. Interval development of mild diffuse interstitial pulmonary edema. Bilateral pleural effusions, right greater than left, with associated passive atelectasis in the lower lobes. Degenerative changes and DISH involving the thoracic spine. IMPRESSION: 1. Mild CHF, with stable marked cardiomegaly and mild diffuse interstitial pulmonary edema. 2. Bilateral pleural effusions, right greater than left, with associated passive atelectasis in the lower lobes. 3. Thoracic aortic atherosclerosis. Electronically Signed   By: Evangeline Dakin M.D.   On: 01/10/2017 17:08    Cardiac Studies   V/Q scan completed but pending read.  Echo: 01/08/2017 Study Conclusions - Procedure narrative: Transthoracic echocardiography. Image quality was adequate. The study was technically difficult, as a result of body habitus. - Left ventricle: The cavity size was normal. Wall thickness was increased in a pattern of mild LVH. Systolic function was normal. The estimated ejection fraction was in the range of 60% to 65%. Wall motion was normal; there were no regional wall motion abnormalities. Doppler parameters are consistent with abnormal left ventricular relaxation (grade 1 diastolic dysfunction).  The Ee&' ratio is between 8-15, suggesting indeterminate LV filling pressure. - Ventricular septum: Septal motion showed paradox. The contour showed diastolic flattening and systolic flattening. This suggests pressure and volume overload of the RV. - Mitral valve: Calcified annulus. Mildly thickened leaflets . There was trivial regurgitation. - Left atrium: The atrium was normal in size. - Right ventricle: The cavity size was moderately dilated. Mildly reduced systolic function. - Right atrium: Severely dilated. - Tricuspid valve: There was moderate regurgitation. - Pulmonary arteries: PA peak pressure: 75 mm Hg (S). - Inferior vena cava: The vessel was dilated. The respirophasic diameter changes were blunted (<50%), consistent with elevated central venous pressure. Impressions: - Technically difficult study. LVEF 60-65%, mild LVH, normal wall motion, grade  1 DD with indeterminate LV filling pressure, moderate RAE with mildly reduced RV systolic function, ventricular septal flattening (Septal-D sign) and paradoxical motion consistent with pressure and volume overload of the RV, severe RAE, moderate TR, RVSP 75 mmHg, dilated IVC (but no clear flow reversal in the hepatic vein), no pericardial effusion.    Patient Profile     Carolyn Hendricks is a 81 y.o. female with a history of HTN, tobacco abuse, atrial tachycardia, chronic anemia, carotid artery disease s/p L CEA, PAD, COPD on 2L home 02, LBBB, non obst CAD, CKD, chronic diastolic CHF and chronic LE edema who presented to [facility] on [facility]/06[facility]17 with abdominal pain, N/V, decreased PO intake. Cardiology asked to consult given abnormal echocardiogram.   Assessment & Plan    Right heart failure with severe pulm HTN: 2D ECHO showed normal LV function, mild LVH, normal wall motion, G1DD, moderate RAE with mildly reduced RV systolic function, ventricular septal flattening (Septal-D sign) and paradoxical motion  consistent with pressure and volume overload of the RV,severe RAE, moderate TR, RVSP 75 mmHg, dilated IVC (but no clearflow reversal in the hepatic vein), no pericardial effusion.  -- This could be cor pulmonale secondary to longstanding COPD and hypoxemia. However, pulm HTN up from normal in 2015 to 75. V/Q scan completed and pending read to rule out CTEPH given significantly elevated pulmonary pressures that seem out of proportion to COPD. CXR did show some pulmonary vascular congestion. -- Arterial blood gas shows chronic hypercapnia with pCO2 57.8 and low normal PH -- Going for right heart cath today with Dr. Haroldine Laws.   AKI: creat improving 3.49 --> 1.28--> 1.05 after IVFs. She had poor PO intake and diarrhea.   HTN: BP has been mild to moderately elevated  Ectatic abdominal aorta: noted on recent CT scan. At risk for aneurysm development (recommend followup by ultrasound in 5 years)  Chronic anemia: H/H mildly decreased, likely from hydration 9.1/34.5--> 8.6/33.3  COPD: continue 02. She has quit smoking  Carotid artery disease s/p L CEA, non obstructive CAD, PAD: continue ASA/plavix and statin   Elevated troponin: low and flat c/w demand ischemia. Cath in 2015 with 50% LAD and otherwise normal cors  Signed, Angelena Form, PA-C  01/11/2017, 8:44 AM   Personally seen and examined. Agree with above.  Slightly tired today, minimal shortness of breath. Decreased breath sounds bilaterally, heart regular rate and rhythm, 2+ lower extremity edema  Pulmonary hypertension  -combination of WHO II and WHO III (from left heart as well as pulmonary conditions of COPD, obesity, possible obstructive sleep apnea and hypoventilation) with evidence of RV failure. See right heart catheterization below.   - RHC results:  Findings:  RA = 21 RV = 78/26 PA = 81/36 (51) PCW = 29 Fick cardiac output/index = 3.8/1.96 PVR = 5.8 Ao sat = 98% PA sat = 39%, 43% RA/PCWP = 0.72 PAPi =  2.14  Assessment: 1. Severe mixed pulmonary HTN (WHO Group II & III) with evidence of RV failure/low output and volume overload  Plan/Discussion:  Based on hemodynamics overall prognosis is poor. Would proceed with IV diuresis. Will need to optimize respiratory status with CPAP/BiPAP as tolerated. Due to somnolence serum ammonia drawn in lab. Could consider IV milrinone as last resort therapy for RV support but she seems to be poor candidate for home inotropes. Consider Palliative Care involvement.   Glori Bickers, MD   - Appreciate Dr. Clayborne Dana assistance.  - Poor overall prognosis.  - Lasix IV 80 mg  twice a day ordered.  Candee Furbish, MD

## 2017-01-11 NOTE — Progress Notes (Signed)
Patient transferred from 6n08 to 4n11. Report called to nurse who would be taking her on [place]. Family aware of transfer and were in her 6north room with her. All belongings taken by family.

## 2017-01-11 NOTE — Progress Notes (Signed)
FPTS Interim Progress Note  S: Went to evaluate patient. Still sleepy but breathing appears improved but wakes up occasionally to voice. Does follow some commands such moving toes and able to grip hands bilaterally.   O: BP (!) 173/92   Pulse 66   Temp 98.1 F (36.7 C) (Oral)   Resp 20   Ht 5' (1.524 m)   Wt 101 kg (222 lb 11.2 oz)   SpO2 100%   BMI 43.49 kg/m   GEN: NAD HEENT: pupils equal  CV: RRR, no murmurs, rubs, or gallops PULM: normal effort overall on bipap, diminished breath sounds anteriorly and difficult to auscultate especially on the right side  SKIN: No rash or cyanosis;  well-perfused, slightly pale appearing  EXTR: pitting edema up to mid thigh bilaterally  NEURO: sleepy but wakes up to voice   A/P: Troponin from earlier today was lower than prior troponin.   Repeat ABG after being on Bipap  Repeat CXR   Smiley Houseman, MD 01/11/2017, 5:06 PM PGY-2, [facility] Service pager [phone]

## 2017-01-11 NOTE — CV Procedure (Signed)
RHC results:  Findings:  RA = 21 RV = 78/26 PA = 81/36 (51) PCW = 29 Fick cardiac output/index = 3.8/1.96 PVR = 5.8 Ao sat = 98% PA sat = 39%, 43% RA/PCWP = 0.72 PAPi = 2.14  Assessment: 1. Severe mixed pulmonary HTN (WHO Group II & III) with evidence of RV failure/low output and volume overload  Plan/Discussion:  Based on hemodynamics overall prognosis is poor. Would proceed with IV diuresis. Will need to optimize respiratory status with CPAP/BiPAP as tolerated. Due to somnolence serum ammonia drawn in lab. Could consider IV milrinone as last resort therapy for RV support but she seems to be poor candidate for home inotropes. Consider Palliative Care involvement.   Carolyn Bickers, MD  10:07 AM

## 2017-01-11 NOTE — Progress Notes (Signed)
OT Cancellation 01/11/2017    01/11/17 1500  OT Visit Information  Last OT Received On 01/11/17  Reason Eval/Treat Not Completed Medical issues which prohibited therapy (Pt transfered to ICU due to worsening heart failure per RN)   Loretha Brasil, OTR/L [phone]

## 2017-01-11 NOTE — Significant Event (Signed)
Rapid Response Event Note  Overview:  Called to assist with patient with decreased LOC and resp distress Time Called: 1110 Arrival Time: 1130 Event Type: Neurologic, Respiratory  Initial Focused Assessment:  On arrival MD staff at bedside.  Patient returned from cath lab about 30 mins prior- decreased LOC and increased WOB per staff.  Patient arouses - answers few questions but confused - falls back to sleep - bil BS distant - O2 sats 100% on 2 liters nasal cannula - skin warm and dry - color pale - abd large soft - multiple areas of bruising noted abd - bil arms - legs.  Left antecubital DDI from cath - coban very tight - cath staff report leave for about 2 hours.  No intervention per cath team - main dx Pulmonary HTN.  No sedation for cath per report - staff report she was sleepy on admission to cath lab - ammonia level drawn there - 37.  BP 158/83 HR 78 RR 28.  CBG 87.   Interventions: Stat 12 lead EKG, PCXR, ABG and labs per MD staff.  Patient placed on Bipap per RT - tol fair.  Increased air movement with Bipap.  ABG results given to MD.  Will move to SDU.  Resting on Bipap - arouses to name - recognizes family - still somewhat sleepy - states her breathing is better.  Tol Bipap.  1400:  163/91 HR 64 SR RR 20 O2 sats 100% on Bipap 40%.  Removed dressing left antecubital cath site - CDI - dressing replaced.  Awaiting bed assignment.  Family in - updates given.  Questions answered.  Transferred to 4N11 with Bipap - tol well.  Handoff to [employer].   Plan of Care (if not transferred):  Event Summary: Name of Physician Notified: Dr. Verdene Rio at  (PTA RRT)    at    Outcome: Transferred (Comment)     Vevelyn Royals, Nena Jordan

## 2017-01-11 NOTE — Interval H&P Note (Signed)
History and Physical Interval Note:  01/11/2017 9:38 AM  Carolyn Hendricks  has presented today for surgery, with the diagnosis of hf and PAH The various methods of treatment have been discussed with the patient and family. After consideration of risks, benefits and other options for treatment, the patient has consented to  Procedure(s): Right Heart Cath (N/A) as a surgical intervention .  The patient's history has been reviewed, patient examined, no change in status, stable for surgery.  I have reviewed the patient's chart and labs.  Questions were answered to the patient's satisfaction.     Carolyn Hendricks, Quillian Quince

## 2017-01-11 NOTE — Progress Notes (Signed)
Patient transported to Cardiac Cath lab. Telemetry removed and notified CCMD.

## 2017-01-11 NOTE — H&P (View-Only) (Signed)
    CARDIOLOGY CONSULT NOTE   Patient ID: Carolyn Hendricks MRN: [id number] DOB/AGE: 03/18/1936 81 y.o.  Admit date: 01/06/2017  Requesting Physician: Dr. Neal Primary Physician:   Donald Fisher, MD Primary Cardiologist:  Dr. Gollan / Dr Arida (PAD)  Reason for Consultation: abnormal ECHO  Carolyn Hendricks is a 81 y.o. female who is being seen today for the evaluation of abnormal echo at the request of Dr. Neal.  HPI: Carolyn Hendricks is a 81 y.o. female with a history of HTN, tobacco abuse, atrial tachycardia, chronic anemia, carotid artery disease s/p L CEA, PAD, COPD on 2L home 02, LBBB, non obst CAD, CKD, chronic diastolic CHF and chronic LE edema who presented to [facility] on 01/06/17 with abdominal pain, N/V, decreased PO intake. Cardiology asked to consult given abnormal echocardiogram.   2D ECHO in 02/2014 showed normal LV and RV function with some diastolic dysfunction, mild MR and mild TR.   She had chest pain concerning for angina and underwent cardiac catheterization in 06/2015 showing 50% prox LAD. However, she was noted to have right common iliac artery stenosis on angiography. The patient underwent noninvasive cardiac evaluation which showed normal ABI bilaterally. Duplex showed significant bilateral common iliac artery stenosis worse on the right side. The patient's mobility is somewhat limited due to poor balance. She walks with a cane. She was seen by Dr. Arida and plan was for continue surveillance given CKD and poor mobility.   She also has a history of LE edema for which she has been prescribed lasix in the past and compression stockings.   She was admitted in 06/2016 for AMS. Cardiology was asked to consult for mildly elevated troponin. Acute encephalopathy felt to be 2/2 CO2 narcosis. Cardiology felt chest pain more GI. No further work up indicated.   She was seen in the [facility] ED on 12/11/16 for abdominal pain. CT abdomen showed nonspecific presacral stranding (? proctatitis),  mildly nodular liver contours (? Hepatitis), small pleural effusion, mild cardiomegaly, scattered diverticulosis without evidence of diverticulitis, ectatic abdominal aorta at risk for aneurysm development (recommend followup by ultrasound in 5 years). She was discharged home.   She presented to [facility] on 01/06/17 with abdominal pain, N/V, decreased PO intake. Work up still with uncertain etiology. Hepatitis less likely with negative Hep panel. There was concern for right sided heart failure. 2D ECHO showed normal LV function, mild LVH, normal wall motion, G1DD, moderate RAE with mildly reduced RV systolic function, ventricular septal flattening (Septal-D sign) and paradoxical motion consistent with pressure and volume overload of the RV, severe RAE, moderate TR, RVSP 75 mmHg, dilated IVC (but no clear flow reversal in the hepatic vein), no pericardial effusion. BNP >4500.   Laboratory: Hepatitis panel negative. HIV nonreactive. FOBT negative C diff negative Ferritin 27, Fe 11, TIBC 428  She is a very difficult historian but mostly complains of abdominal pain and diarrhea. She also has chest pain at least 3-4 times? She mumbles and trails off and is difficult to follow. She has chronic LE edema which is stable. She has SOB. She wears 2L 02 at home. No orthopnea or PND. No dizziness or syncope. No blood in stool or urine.     Past Medical History:  Diagnosis Date  . Arthritis   . Chest pain   . CHF (congestive heart failure) (HCC)   . Chronic kidney disease   . Diastolic dysfunction   . Dizziness   . Head ache   . Heart   murmur   . High blood pressure   . History of MRSA infection   . Renal insufficiency   . Shortness of breath      Past Surgical History:  Procedure Laterality Date  . APPENDECTOMY  1950  . CARDIAC CATHETERIZATION N/A 06/12/2015   Procedure: Left Heart Cath and Coronary Angiography;  Surgeon: Minna Merritts, MD;  Location: [facility];  Service: Cardiovascular;   Laterality: N/A;  . CAROTID ENDARTERECTOMY Left Dr. Lucky Cowboy  . CHOLECYSTECTOMY N/A 12/01/2015   Procedure: LAPAROSCOPIC CHOLECYSTECTOMY WITH INTRAOPERATIVE CHOLANGIOGRAM;  Surgeon: Hubbard Robinson, MD;  Location: [facility];  Service: General;  Laterality: N/A;  . foot and ankle surgery    . FRACTURE SURGERY    . JOINT REPLACEMENT    . knee replacement (other) Left 2002    Allergies  Allergen Reactions  . Atorvastatin Other (See Comments)    Other reaction(s): Muscle Pain  . Crestor [Rosuvastatin Calcium] Other (See Comments)    Other reaction(s): Muscle Pain  . Tape Other (See Comments)    Makes a bruise on my skin. Paper tape is ok to use.  . Pravastatin Sodium Other (See Comments)    Muscle pain    I have reviewed the patient's current medications . aspirin EC  81 mg Oral Daily  . Chlorhexidine Gluconate Cloth  6 each Topical Q0600  . clopidogrel  75 mg Oral q morning - 10a  . docusate sodium  100 mg Oral BID  . fluticasone furoate-vilanterol  1 puff Inhalation Daily  . heparin  5,000 Units Subcutaneous Q8H  . isosorbide mononitrate  30 mg Oral Daily  . levothyroxine  75 mcg Oral QAC breakfast  . metoprolol  50 mg Oral BID  . mupirocin ointment  1 application Nasal BID  . pantoprazole  40 mg Oral Daily  . sertraline  50 mg Oral QHS  . simvastatin  20 mg Oral q1800  . sodium bicarbonate  650 mg Oral TID  . sodium chloride flush  3 mL Intravenous Q12H    sodium chloride, acetaminophen **OR** acetaminophen, fentaNYL (SUBLIMAZE) injection, HYDROcodone-acetaminophen, ondansetron **OR** ondansetron (ZOFRAN) IV, sodium chloride flush, traZODone  Prior to Admission medications   Medication Sig Start Date End Date Taking? Authorizing Provider  ADVAIR DISKUS 250-50 MCG/DOSE AEPB INHALE ONE (1) PUFF EVERY 12 HOURS AS DIRECTED. RINSE MOUTH AFTER EACH USE. 08/11/16  Yes Birdie Sons, MD  albuterol (PROVENTIL HFA;VENTOLIN HFA) 108 (90 Base) MCG/ACT inhaler Inhale 1 puff into the  lungs every 6 (six) hours as needed for wheezing or shortness of breath.   Yes Historical Provider, MD  aspirin 81 MG tablet Take 81 mg by mouth every morning. Reported on 01/07/2016   Yes Historical Provider, MD  Cholecalciferol (VITAMIN D-3) 5000 units TABS Take 1 tablet by mouth daily.   Yes Historical Provider, MD  clopidogrel (PLAVIX) 75 MG tablet TAKE ONE TABLET BY MOUTH EVERY MORNING 12/16/16  Yes Birdie Sons, MD  fluticasone [facility]) 50 MCG/ACT nasal spray INHALE ONE TO TWO SPRAY IN EACH NOSTRIL DAILY AS NEEDED 12/19/16  Yes Birdie Sons, MD  HYDROcodone-acetaminophen (NORCO) 10-325 MG tablet Take 1 tablet by mouth every 4 (four) hours as needed for moderate pain or severe pain. 01/05/17  Yes Birdie Sons, MD  isosorbide mononitrate (IMDUR) 30 MG 24 hr tablet TAKE 1 TABLET BY MOUTH DAILY FOR HEART 12/16/16  Yes Minna Merritts, MD  levothyroxine (SYNTHROID, LEVOTHROID) 75 MCG tablet TAKE 1 TABLET BY MOUTH  DAILY (FOR THYROID) Patient taking differently: Take 75 mcg by mouth daily 04/13/16  Yes Donald E Fisher, MD  meloxicam (MOBIC) 15 MG tablet TAKE ONE (1) TABLET BY MOUTH DAILY AS NEEDED FOR ARTHRITIS PAIN. 12/19/16  Yes Donald E Fisher, MD  metoprolol (LOPRESSOR) 100 MG tablet Take 1 tablet (100 mg total) by mouth 2 (two) times daily. 03/08/16  Yes Timothy J Gollan, MD  nitroGLYCERIN (NITROSTAT) 0.4 MG SL tablet Place 1 tablet (0.4 mg total) under the tongue every 5 (five) minutes as needed for chest pain. 05/14/15  Yes Timothy J Gollan, MD  ondansetron (ZOFRAN ODT) 4 MG disintegrating tablet Take 1 tablet (4 mg total) by mouth every 8 (eight) hours as needed for nausea or vomiting. 12/11/16  Yes Phillip Stafford, MD  pantoprazole (PROTONIX) 40 MG tablet Take 1 tablet (40 mg total) by mouth daily. 12/27/16  Yes Donald E Fisher, MD  sertraline (ZOLOFT) 50 MG tablet Take 25 mg by mouth daily for mood Patient taking differently: Take 50 mg by mouth at bedtime.  07/05/16  Yes Anand D Hongalgi, MD    simvastatin (ZOCOR) 20 MG tablet Take 20 mg by mouth daily 01/03/17  Yes Timothy J Gollan, MD  sodium bicarbonate 650 MG tablet Take 650 mg by mouth 3 (three) times daily.   Yes Historical Provider, MD  traZODone (DESYREL) 100 MG tablet TAKE ONE-HALF OR ONE TABLET BY MOUTH AT BEDTIME AS NEEDED FOR SLEEP. 12/16/16  Yes Donald E Fisher, MD  albuterol (PROVENTIL) (2.5 MG/3ML) 0.083% nebulizer solution Take 3 mLs (2.5 mg total) by nebulization every 4 (four) hours as needed for wheezing or shortness of breath. Patient not taking: Reported on 01/06/2017 07/05/16   Anand D Hongalgi, MD  amLODipine (NORVASC) 10 MG tablet Take 1 tablet (10 mg total) by mouth daily. Patient not taking: Reported on 01/06/2017 07/06/16   Anand D Hongalgi, MD  LORazepam (ATIVAN) 0.5 MG tablet Take 0.5 tablets (0.25 mg total) by mouth every 12 (twelve) hours as needed for anxiety. Patient not taking: Reported on 01/06/2017 07/05/16   Anand D Hongalgi, MD  mupirocin ointment (BACTROBAN) 2 % APPLY TO AFFECTED AREA(S) TWO TIMES PER DAY AS NEEDED Patient not taking: Reported on 01/06/2017 08/16/16   Donald E Fisher, MD     Social History   Social History  . Marital status: Widowed    Spouse name: N/A  . Number of children: 4  . Years of education: 12   Occupational History  . retired    Social History Main Topics  . Smoking status: Former Smoker    Packs/day: 1.00    Years: 26.00    Types: Cigarettes    Quit date: 10/11/2011  . Smokeless tobacco: Never Used     Comment: Patient has smoked for 26 years; has quit but now started back 0.25 ppw.  . Alcohol use No  . Drug use: No  . Sexual activity: No   Other Topics Concern  . Not on file   Social History Narrative  . No narrative on file    Family Status  Relation Status  . Mother Deceased at age 55  . Brother Deceased  . Father Deceased at age 82   heart  . Sister Deceased at age 82  . Daughter Alive  . Son Alive  . Daughter Alive  . Daughter Alive   Family  History  Problem Relation Age of Onset  . Congestive Heart Failure Mother 82  . Cancer Brother   .   Kidney failure Father     ROS:  Full 14 point review of systems complete and found to be negative unless listed above.  Physical Exam: Blood pressure (!) 138/45, pulse 61, temperature 98.2 F (36.8 C), temperature source Oral, resp. rate 18, weight 222 lb 11.2 oz (101 kg), SpO2 100 %.  General: Well developed, well nourished, female in no acute distress, obese, chronically ill appearing Head: Eyes PERRLA, No xanthomas.   Normocephalic and atraumatic, oropharynx without edema or exudate. Lungs: Diminished breath sounds bilaterally with large body habitus.  Heart: HRRR S1 S2, no rub/gallop, Heart irregular rate and rhythm with S1, S2 soft murmur. pulses are 2+ extrem.   Neck: No carotid bruits. No lymphadenopathy. Difficult to assess JVD due to body habitus. Abdomen: Bowel sounds present, abdomen soft and non-tender without masses or hernias noted. Msk:  No spine or cva tenderness. No weakness, no joint deformities or effusions. Extremities: No clubbing or cyanosis. 2+ LE edema bilaterally.  Neuro: Alert and oriented X 3. No focal deficits noted. Psych:  Good affect, responds appropriately Skin: No rashes or lesions noted.  Labs:   Lab Results  Component Value Date   WBC 3.9 (L) 01/10/2017   HGB 8.2 (L) 01/10/2017   HCT 32.1 (L) 01/10/2017   MCV 81.7 01/10/2017   PLT 120 (L) 01/10/2017   No results for input(s): INR in the last 72 hours.  Recent Labs Lab 01/09/17 0323 01/10/17 0422  NA 144 146*  K 4.0 4.0  CL 105 103  CO2 31 34*  BUN 28* 21*  CREATININE 1.59* 1.28*  CALCIUM 8.5* 8.9  PROT 5.7*  --   BILITOT 0.9  --   ALKPHOS 101  --   ALT 39  --   AST 40  --   GLUCOSE 104* 100*  ALBUMIN 3.5  --    Magnesium  Date Value Ref Range Status  07/01/2016 2.0 1.7 - 2.4 mg/dL Final   No results for input(s): CKTOTAL, CKMB, TROPONINI in the last 72 hours. No results for  input(s): TROPIPOC in the last 72 hours. No results found for: PROBNP Lab Results  Component Value Date   CHOL 181 12/12/2014   HDL 43 12/12/2014   LDLCALC 94 12/12/2014   TRIG 218 (A) 12/12/2014   No results found for: DDIMER Lipase  Date/Time Value Ref Range Status  01/06/2017 07:22 AM 27 11 - 51 U/L Final   TSH  Date/Time Value Ref Range Status  07/01/2016 06:55 AM 0.639 0.350 - 4.500 uIU/mL Final  04/27/2015 09:40 AM 0.951 0.450 - 4.500 uIU/mL Final   Ferritin  Date/Time Value Ref Range Status  01/08/2017 03:33 AM 27 11 - 307 ng/mL Final   TIBC  Date/Time Value Ref Range Status  01/08/2017 03:33 AM 428 250 - 450 ug/dL Final   Iron  Date/Time Value Ref Range Status  01/08/2017 03:33 AM 11 (L) 28 - 170 ug/dL Final    Echo: 01/08/2017 Study Conclusions - Procedure narrative: Transthoracic echocardiography. Image   quality was adequate. The study was technically difficult, as a   result of body habitus. - Left ventricle: The cavity size was normal. Wall thickness was   increased in a pattern of mild LVH. Systolic function was normal.   The estimated ejection fraction was in the range of 60% to 65%.   Wall motion was normal; there were no regional wall motion   abnormalities. Doppler parameters are consistent with abnormal   left ventricular relaxation (grade 1 diastolic dysfunction).  The   Ee&' ratio is between 8-15, suggesting indeterminate LV filling   pressure. - Ventricular septum: Septal motion showed paradox. The contour   showed diastolic flattening and systolic flattening. This   suggests pressure and volume overload of the RV. - Mitral valve: Calcified annulus. Mildly thickened leaflets .   There was trivial regurgitation. - Left atrium: The atrium was normal in size. - Right ventricle: The cavity size was moderately dilated. Mildly   reduced systolic function. - Right atrium: Severely dilated. - Tricuspid valve: There was moderate regurgitation. -  Pulmonary arteries: PA peak pressure: 75 mm Hg (S). - Inferior vena cava: The vessel was dilated. The respirophasic   diameter changes were blunted (< 50%), consistent with elevated   central venous pressure. Impressions: - Technically difficult study. LVEF 60-65%, mild LVH, normal wall   motion, grade 1 DD with indeterminate LV filling pressure,   moderate RAE with mildly reduced RV systolic function,   ventricular septal flattening (Septal-D sign) and paradoxical   motion consistent with pressure and volume overload of the RV,   severe RAE, moderate TR, RVSP 75 mmHg, dilated IVC (but no clear   flow reversal in the hepatic vein), no pericardial effusion.  ECG: Sinus bradycardia, LBBB  - personally reviewed  TELE: NSR, sinus brady, LBBB , PVCs - personally reviewed  Radiology:  No results found.  ASSESSMENT AND PLAN:    Active Problems:   Decreased oral intake   AKI (acute kidney injury) (HCC)   Diarrhea   Moderate malnutrition (HCC)  Annalysa F Limpert is a 81 y.o. female with a history of HTN, tobacco abuse, atrial tachycardia, chronic anemia, carotid artery disease s/p L CEA, PAD, COPD on 2L home 02, LBBB, non obst CAD, CKD, chronic diastolic CHF and chronic LE edema who presented to [facility] on 01/06/17 with abdominal pain, N/V, decreased PO intake. Cardiology asked to consult given abnormal echocardiogram.   Right heart failure with severe pulm HTN: 2D ECHO showed normal LV function, mild LVH, normal wall motion, G1DD, moderate RAE with mildly reduced RV systolic function, ventricular septal flattening (Septal-D sign) and paradoxical motion consistent with pressure and volume overload of the RV, severe RAE, moderate TR, RVSP 75 mmHg, dilated IVC (but no clear flow reversal in the hepatic vein), no pericardial effusion.  -- This could be cor pulmonale secondary to longstanding COPD and hypoxemia. However, pulm HTN up from normal in 2015 to 75. Will plan for V/Q scan to rule out CTEPH given  significantly elevated pulmonary pressures that seem out of proportion to COPD. ? Hepatopulmonary pulmonary HTN -- Plan was to diruese her given significant elevation in BNP (>4500). However, she had AKI with creat up to 3.49 that improved with IV hydration. Will plan for an arterial blood gas and right heart cath tomorrow at 9am to get a better feel of right heart and pulmonary pressures.   AKI: creat improving 3.49 --> 1.28 after IVFs. She has had poor PO intake and diarrhea.   HTN: BP has been mild to moderately elevated  Ectatic abdominal aorta: noted on recent CT scan. At risk for aneurysm development (recommend followup by ultrasound in 5 years)  Chronic anemia: H/H mildly decreased, likely from hydration 9.1/34.5--> 8.2/32.1  COPD: continue 02. She has quit smoking  Carotid artery disease s/p L CEA, non obstructive CAD, PAD: continue ASA/plavix and statin   Elevated troponin: low and flat c/w demand ischemia. Cath in 2015 with 50% LAD and otherwise normal cors     Signed: Angelena Form, PA-C 01/10/2017 12:15 PM  Pager [phone]  Co-Sign MD  Personally seen and examined. Agree with above.  81 year old with COPD, peripheral vascular disease with newly discovered right heart failure, pulmonary hypertension with recent acute kidney injury  Pulmonary hypertension, secondary  - We will check a VQ scan for any evidence of chronic thromboembolic disease  - We will check a blood gas to see if she has any evidence of chronic CO2 retention  - Likely multifactorial from COPD, obesity, chronic diastolic heart failure  - We will hold off on diuresis given her recent acute kidney injury requiring gentle IV fluids.  - Her symptoms of stomach discomfort, nausea certainly could be secondary to elevated right-sided heart pressures.  - Tomorrow, we will set her up for right heart catheterization with Dr. Haroldine Laws to check her pulmonary pressures, cardiac output.  - Has moderate coronary  artery disease by left heart catheterization in 2015.  Physical exam-elderly, obese, deconditioned sitting on edge of bed, increased respiratory effort, elevated JVD, minimal crackles in lungs bilaterally, mild lower extremity edema.  Candee Furbish, MD

## 2017-01-11 NOTE — Progress Notes (Signed)
There is an order to transfer to higher level of care due to patient being on Bipap, I called patient's daughter Jackelyn Poling and let her know the events that happened today. She said she will be on her way to see her soon.

## 2017-01-11 NOTE — Progress Notes (Signed)
Family Medicine Teaching Service Daily Progress Note Intern Pager: [phone]  Patient name: Carolyn Hendricks Medical record number: [id number] Date of birth: November 12, 1935 Age: 81 y.o. Gender: female  Primary Care Provider: [name], MD Consultants: None Code Status: Full   Pt Overview and Major Events to Date:  3/30 - Admitted to FPTS for inability to tolerate PO  Carolyn Hendricks is a 81 y.o. female presenting with decreased po intake and abdominal pain. PMH is significant for CAD, asthma,  HTN, hypothyroidism with goiter, CKDIII, DDD with spinal stenosis of lumbar region, CHF, HLD, anxiety, acute renal failure, obesity and history of tobacco use. Has had cholecystectomy and appendectomy.    CHF, right heart failure:  may be cor pulmonale 2/2 COPD., however pulm HTN also noted, possible hepatopulmonary HTN component per cards notes - I/Os, daily weights - cardiology consult -  right heart cath plan for 4/4 - hold off diurese given R heart failure and AKI  - VQ scan low probability PE  Abdominal pain/nausea/vomiting/decreased po intake, improved:  Uncertain etiology, considering gastroenteritis. Hepatitis less likely with negative Hep panel. Post-cholecystectomy diarrhea considered (lap chole 11/2015).  GI panel negative. TSH WNL. - started cholestyramine (4g packet Qd) -Tylenol 650 mg Q6 prn for pain  - Zofran 4 mg Q6 prn for nausea   16 beats of Vtach - overnight 4/4, asymptomatic - check mag - monitor  Acute on chronic Renal Failure, resolved:  SCr 1.05 from 3.49 on admission. Likely dehydration from decreased PO over last several days. Baseline Cr ~0.9.  With CKD III.  - AM BMET   Anemia acute on chronic: Hgb stable 8.6 from 8.2 yesterday. Iron low at 11, TIBC WNL, saturation low at 3%. Consistent with anemia of chronic disease.  Reticulocytes WNL at 2.1  -  FeSO4 supplementation 325 mg QD started 4/3 - transfusion threshold 8 - recheck AM CBC  Weakness: likely related to  poor PO intake although degenerative disc disease is probably contributing to history of falls.  Has had recent fall but did not hit head.  No LOC.   - PT/OT : SNF (CSW consult placed)  CAD: Troponins have been elevated to 0.09 in the past. Troponins trended down 0.12 > 0.09.   - continue home aspirin, Zocor 20 mg and Plavix   Urinary symptoms, resolved: Initial Reports increased frequency, urgency and dysuria.  UA with trace leuks. Gram stain with gram variable rods and gram positive cocci in pairs. Urine cx: multiple species. - monitor, consider treating if patient complains of symptoms - Continue to monitor   Asthma/COPD, stable:  At home on Advair diskus.  Not on formulary but ordered as Breo Ellipta.  -Continue Breo Ellipta 1 puff daily - continue home O2 by [place] (2L)   HTN, controlled.  At home on Imdur 30 mg daily, Lopressor 100 mg BID, and Norvasc 10 mg daily .   -Continue home Imdur and Lopressor.   -Hold Norvasc for now in setting of leg swelling  -Monitor blood pressures  Hypothyroidism, stable.  Last TSH 0.639 and wnl, on 07/01/2016.  At home on Synthroid.  -Continue home Synthroid 75 mcg   DDD with spinal stenosis of lumbar region, stable.  Complaining of back pain on admission but this is not new.  Denies acute symptoms.  At home on Norco 10-325 Q4 PRN.   -Continue home pain med  Anxiety/Depression:  At home on Zoloft.  Per daughter, used to take Ativan but no longer is on it.  -  Continue Zoloft 50 mg po at bedtime   CKD III:  Follows with [place] home sodium bicarb 650 mg daily    Insomnia.  At home on Trazodone 100 mg prn at bedtime.  -Continue home Trazodone  FEN/GI: Heart healthy diet Prophylaxis: Heparin, Protonix   Disposition: Admit to med-surg for IV hydration and pain control, attending Dr. Nori Riis; patient interested in going back to [facility] (SNF) but awaiting PT/OT recs   Subjective:  No acute events overnight. Went for barium  enema today and tolerated well  Objective: Temp:  [98 F (36.7 C)-98.2 F (36.8 C)] 98.2 F (36.8 C) (04/03 2012) Pulse Rate:  [67-73] 70 (04/04 0207) Resp:  [20] 20 (04/03 2012) BP: (138-165)/(45-93) 165/93 (04/04 0207) SpO2:  [93 %-100 %] 100 % (04/04 0933) Physical Exam: General: Elderly female rests comfortably in bed Cardiovascular: RRR, no m/r/g Respiratory: Diminished breath sounds bilaterally with large body habitus.  Abdomen: soft, obese, abdomen nontender No distention. +BS Extremities: +2-3 LE edema bilaterally with 2+ pedal edema. Moves all spontaneously.   Laboratory: Hepatitis panel negative. HIV nonreactive. FOBT negative C diff negative Ferritin 27, Fe 11, TIBC 428  Hepatic Function Latest Ref Rng & Units 01/09/2017 01/08/2017 01/06/2017  Total Protein 6.5 - 8.1 g/dL 5.7(L) 5.6(L) 6.3(L)  Albumin 3.5 - 5.0 g/dL 3.5 3.5 3.9  AST 15 - 41 U/L 40 51(H) 122(H)  ALT 14 - 54 U/L 39 48 79(H)  Alk Phosphatase 38 - 126 U/L 101 117 168(H)  Total Bilirubin 0.3 - 1.2 mg/dL 0.9 0.9 1.7(H)     Recent Labs Lab 01/09/17 0323 01/10/17 0422 01/11/17 0417  WBC 4.9 3.9* 5.7  HGB 8.4* 8.2* 8.6*  HCT 33.2* 32.1* 33.3*  PLT 121* 120* 138*    Recent Labs Lab 01/06/17 0722  01/08/17 0333 01/09/17 0323 01/10/17 0422 01/11/17 0417  NA 138  < > 142 144 146* 145  K 4.7  < > 4.3 4.0 4.0 4.0  CL 96*  < > 103 105 103 105  CO2 30  < > 33* 31 34* 33*  BUN 55*  < > 35* 28* 21* 15  CREATININE 3.49*  < > 1.86* 1.59* 1.28* 1.05*  CALCIUM 9.3  < > 8.6* 8.5* 8.9 9.2  PROT 6.3*  --  5.6* 5.7*  --   --   BILITOT 1.7*  --  0.9 0.9  --   --   ALKPHOS 168*  --  117 101  --   --   ALT 79*  --  48 39  --   --   AST 122*  --  51* 40  --   --   GLUCOSE 87  < > 140* 104* 100* 111*  < > = values in this interval not displayed.  Imaging/Diagnostic Tests:  Dg Chest Port 1 View 01/06/2017 FINDINGS: There is airspace consolidation with volume loss the right lower lobe. There is a right  pleural effusion. Left lung is clear. Heart is enlarged with pulmonary vascularity within normal limits. No adenopathy. There is atherosclerotic calcification in the aorta. There is degenerative change in the shoulders.  IMPRESSION: Airspace consolidation with volume loss in the right lower lobe. Suspect pneumonia. There is cardiomegaly with small right pleural effusion. No frank edema. Left lung clear. There is aortic atherosclerosis.   Ct Abdomen Pelvis W Contrast 12/11/2016  IMPRESSION:  1. Nonspecific presacral stranding, with question of underlying soft tissue inflammation about the rectum. Would correlate clinically for any  evidence of proctitis.  2. Mildly nodular contour of the liver raises concern for mild hepatic cirrhosis. Trace ascites noted about the liver.  3. Small right and trace left pleural effusions. Right basilar atelectasis noted.  4. Mild cardiomegaly.  5. Focal scarring at the midportion of the right kidney.  6. Scattered diverticulosis along the sigmoid colon, without evidence of diverticulitis.  7. Ectatic abdominal aorta at risk for aneurysm development. Recommend followup by ultrasound in 5 years. This recommendation follows ACR consensus guidelines: White Paper of the ACR Incidental Findings Committee II on Vascular Findings. J Am Coll Radiol 2013; 10:789-794. 8. Likely mild luminal narrowing along the common iliac arteries bilaterally, secondary to calcific atherosclerotic disease. Scattered aortic atherosclerosis.  9. Mild right convex thoracolumbar scoliosis. Partial chronic osseous fusion at L1-L2.  10. Multiple calcified uterine fibroids noted.8   Cardiac Echo Study Conclusions - Procedure narrative: Transthoracic echocardiography. Image   quality was adequate. The study was technically difficult, as a   result of body habitus. - Left ventricle: The cavity size was normal. Wall thickness was   increased in a pattern of mild LVH. Systolic function was normal.   The  estimated ejection fraction was in the range of 60% to 65%.   Wall motion was normal; there were no regional wall motion   abnormalities. Doppler parameters are consistent with abnormal   left ventricular relaxation (grade 1 diastolic dysfunction). The   Ee&' ratio is between 8-15, suggesting indeterminate LV filling   pressure. - Ventricular septum: Septal motion showed paradox. The contour   showed diastolic flattening and systolic flattening. This   suggests pressure and volume overload of the RV. - Mitral valve: Calcified annulus. Mildly thickened leaflets .   There was trivial regurgitation. - Left atrium: The atrium was normal in size. - Right ventricle: The cavity size was moderately dilated. Mildly   reduced systolic function. - Right atrium: Severely dilated. - Tricuspid valve: There was moderate regurgitation. - Pulmonary arteries: PA peak pressure: 75 mm Hg (S). - Inferior vena cava: The vessel was dilated. The respirophasic   diameter changes were blunted (< 50%), consistent with elevated   central venous pressure.  Impressions: - Technically difficult study. LVEF 60-65%, mild LVH, normal wall   motion, grade 1 DD with indeterminate LV filling pressure,   moderate RAE with mildly reduced RV systolic function,   ventricular septal flattening (Septal-D sign) and paradoxical   motion consistent with pressure and volume overload of the RV,   severe RAE, moderate TR, RVSP 75 mmHg, dilated IVC (but no clear   flow reversal in the hepatic vein), no pericardial effusion.   Everrett Coombe, MD 01/11/2017, 9:48 AM PGY-1, [place] Intern pager: [phone], text pages welcome

## 2017-01-12 ENCOUNTER — Other Ambulatory Visit: Payer: Self-pay | Admitting: Family Medicine

## 2017-01-12 DIAGNOSIS — Z7189 Other specified counseling: Secondary | ICD-10-CM

## 2017-01-12 DIAGNOSIS — I272 Pulmonary hypertension, unspecified: Secondary | ICD-10-CM | POA: Diagnosis present

## 2017-01-12 DIAGNOSIS — I2723 Pulmonary hypertension due to lung diseases and hypoxia: Secondary | ICD-10-CM | POA: Diagnosis present

## 2017-01-12 DIAGNOSIS — J9612 Chronic respiratory failure with hypercapnia: Secondary | ICD-10-CM | POA: Diagnosis present

## 2017-01-12 DIAGNOSIS — J449 Chronic obstructive pulmonary disease, unspecified: Secondary | ICD-10-CM

## 2017-01-12 DIAGNOSIS — J9602 Acute respiratory failure with hypercapnia: Secondary | ICD-10-CM | POA: Diagnosis present

## 2017-01-12 DIAGNOSIS — R4182 Altered mental status, unspecified: Secondary | ICD-10-CM | POA: Diagnosis present

## 2017-01-12 DIAGNOSIS — Z515 Encounter for palliative care: Secondary | ICD-10-CM

## 2017-01-12 LAB — PHOSPHORUS: Phosphorus: 2.6 mg/dL (ref 2.5–4.6)

## 2017-01-12 LAB — BASIC METABOLIC PANEL
Anion gap: 10 (ref 5–15)
BUN: 12 mg/dL (ref 6–20)
CO2: 38 mmol/L — ABNORMAL HIGH (ref 22–32)
Calcium: 8.9 mg/dL (ref 8.9–10.3)
Chloride: 95 mmol/L — ABNORMAL LOW (ref 101–111)
Creatinine, Ser: 1.02 mg/dL — ABNORMAL HIGH (ref 0.44–1.00)
GFR calc Af Amer: 59 mL/min — ABNORMAL LOW (ref >60)
GFR calc non Af Amer: 51 mL/min — ABNORMAL LOW (ref >60)
Glucose, Bld: 84 mg/dL (ref 65–99)
Potassium: 3.7 mmol/L (ref 3.5–5.1)
Sodium: 143 mmol/L (ref 135–145)

## 2017-01-12 LAB — CBC
HEMATOCRIT: 33.4 % — AB (ref 36.0–46.0)
HEMOGLOBIN: 9 g/dL — AB (ref 12.0–15.0)
MCH: 21.5 pg — ABNORMAL LOW (ref 26.0–34.0)
MCHC: 26.9 g/dL — AB (ref 30.0–36.0)
MCV: 79.7 fL (ref 78.0–100.0)
Platelets: 118 10*3/uL — ABNORMAL LOW (ref 150–400)
RBC: 4.19 MIL/uL (ref 3.87–5.11)
RDW: 20.2 % — ABNORMAL HIGH (ref 11.5–15.5)
WBC: 5.6 10*3/uL (ref 4.0–10.5)

## 2017-01-12 LAB — MAGNESIUM
Magnesium: 1.5 mg/dL — ABNORMAL LOW (ref 1.7–2.4)
Magnesium: 1.9 mg/dL (ref 1.7–2.4)

## 2017-01-12 MED ORDER — POTASSIUM CHLORIDE CRYS ER 20 MEQ PO TBCR: 30.0000 meq | EXTENDED_RELEASE_TABLET | Freq: Two times a day (BID) | ORAL | Status: DC

## 2017-01-12 MED ORDER — MUPIROCIN 2 % EX OINT
1.0000 "application " | TOPICAL_OINTMENT | Freq: Two times a day (BID) | CUTANEOUS | Status: AC
  Administered 2017-01-12 – 2017-01-16 (×10): 1 via NASAL
  Filled 2017-01-12 (×2): qty 22

## 2017-01-12 MED ORDER — CHLORHEXIDINE GLUCONATE CLOTH 2 % EX PADS
6.0000 | MEDICATED_PAD | Freq: Every day | CUTANEOUS | Status: AC
  Administered 2017-01-12 – 2017-01-16 (×5): 6 via TOPICAL

## 2017-01-12 MED ORDER — MAGNESIUM SULFATE 2 GM/50ML IV SOLN
2.0000 g | Freq: Once | INTRAVENOUS | Status: AC
  Administered 2017-01-12: 2 g via INTRAVENOUS
  Filled 2017-01-12: qty 50

## 2017-01-12 MED ORDER — POTASSIUM CHLORIDE CRYS ER 20 MEQ PO TBCR
30.0000 meq | EXTENDED_RELEASE_TABLET | Freq: Once | ORAL | Status: AC
  Administered 2017-01-12: 30 meq via ORAL
  Filled 2017-01-12: qty 1

## 2017-01-12 MED ORDER — GUAIFENESIN-DM 100-10 MG/5ML PO SYRP
5.0000 mL | ORAL_SOLUTION | ORAL | Status: DC | PRN
  Administered 2017-01-12 – 2017-01-16 (×8): 5 mL via ORAL
  Filled 2017-01-12 (×7): qty 5

## 2017-01-12 NOTE — Telephone Encounter (Signed)
Carolyn Hendricks advised that prescription placed up front for pick up.

## 2017-01-12 NOTE — Telephone Encounter (Signed)
Pt contacted office for refill request on the following medications:  HYDROcodone-acetaminophen (NORCO) 10-325 MG tablet.  CB#[phone]/MW

## 2017-01-12 NOTE — Progress Notes (Signed)
Patient does not wish to wear BiPAP tonight. RN aware to call respiratory if patient changes her mind.

## 2017-01-12 NOTE — Progress Notes (Signed)
Physical Therapy Treatment Patient Details Name: Carolyn Hendricks MRN: [id number] DOB: 06-14-1936 Today's Date: 01/12/2017    History of Present Illness Carolyn Hendricks a 81 y.o.femalepresenting with decreased po intake and abdominal pain. Pt with increased WOB following heart cath 4/4 requiring bipap and transfer to SDU. PMH is significant for CAD, asthma, HTN, hypothyroidism with goiter, CKDIII, DDD with spinal stenosis of lumbar region, CHF, HLD, anxiety, acute renal failure, obesity and history of tobacco use. Has had cholecystectomy and appendectomy.     PT Comments    Pt presented supine in bed with HOB elevated, awake and willing to participate in therapy session. Pt requested assistance ambulating to restroom. Pt continues to fatigue quickly and requires max encouragement throughout for mobility. All VSS throughout. Pt would continue to benefit from skilled physical therapy services at this time while admitted and after d/c to address the below listed limitations in order to improve overall safety and independence with functional mobility.     Follow Up Recommendations  SNF;Supervision/Assistance - 24 hour     Equipment Recommendations  None recommended by PT;Other (comment) (defer to next venue)    Recommendations for Other Services       Precautions / Restrictions Precautions Precautions: Fall Restrictions Weight Bearing Restrictions: No    Mobility  Bed Mobility Overal bed mobility: Needs Assistance Bed Mobility: Supine to Sit     Supine to sit: Mod assist;+2 for physical assistance     General bed mobility comments: HOB significantly elevated, mod A to move bilateral LEs and to elevate trunk with use of bed pads to position hips at EOB  Transfers Overall transfer level: Needs assistance Equipment used: Rolling walker (2 wheeled) Transfers: Sit to/from Stand Sit to Stand: Min guard         General transfer comment: pt able to performed sit to stand  from bed x1, from toilet x1 and from recliner chair x1, all with min guard, VC'ing for bilateral hand placement  Ambulation/Gait Ambulation/Gait assistance: Min assist;+2 safety/equipment Ambulation Distance (Feet): 10 Feet (10' x2 with sitting rest break for toileting) Assistive device: Rolling walker (2 wheeled) Gait Pattern/deviations: Step-through pattern;Decreased stride length;Shuffle;Trunk flexed Gait velocity: decreased Gait velocity interpretation: Below normal speed for age/gender General Gait Details: min A for stability and safety with use of RW. Pt required VC'ing to maintain a safe distance from RW. Pt with mild instability but no LOB or need for physical assistance.   Stairs            Wheelchair Mobility    Modified Rankin (Stroke Patients Only)       Balance Overall balance assessment: Needs assistance;History of Falls Sitting-balance support: No upper extremity supported;Feet supported Sitting balance-Leahy Scale: Fair     Standing balance support: Bilateral upper extremity supported;During functional activity Standing balance-Leahy Scale: Poor Standing balance comment: pt reliant on bilateral UEs on RW                            Cognition Arousal/Alertness: Awake/alert Behavior During Therapy: Anxious Overall Cognitive Status: Within Functional Limits for tasks assessed                                        Exercises      General Comments        Pertinent Vitals/Pain Pain Assessment: 0-10 Faces Pain Scale: Hurts  little more Pain Location: buttocks Pain Descriptors / Indicators: Sore Pain Intervention(s): Monitored during session    Home Living                      Prior Function            PT Goals (current goals can now be found in the care plan section) Acute Rehab PT Goals Patient Stated Goal: to get better PT Goal Formulation: With patient Time For Goal Achievement: 01/21/17 Potential to  Achieve Goals: Good Progress towards PT goals: Progressing toward goals    Frequency    Min 2X/week      PT Plan Current plan remains appropriate    Co-evaluation PT/OT/SLP Co-Evaluation/Treatment: Yes Reason for Co-Treatment: For patient/therapist safety PT goals addressed during session: Mobility/safety with mobility;Balance;Proper use of DME OT goals addressed during session: ADL's and self-care     End of Session Equipment Utilized During Treatment: Gait belt;Oxygen Activity Tolerance: Patient limited by fatigue;Patient limited by pain Patient left: in chair;with call bell/phone within reach;with family/visitor present Nurse Communication: Mobility status PT Visit Diagnosis: Unsteadiness on feet (R26.81);Muscle weakness (generalized) (M62.81)     Time: 2482-5003 PT Time Calculation (min) (ACUTE ONLY): 42 min  Charges:  $Gait Training: 8-22 mins $Therapeutic Activity: 8-22 mins                    G Codes:       [place], [place], [place] 01/12/2017, 11:36 AM

## 2017-01-12 NOTE — Progress Notes (Signed)
Family Medicine Teaching Service Daily Progress Note Intern Pager: [phone]  Patient name: Carolyn Hendricks Medical record number: [id number] Date of birth: 1936/01/21 Age: 81 y.o. Gender: female  Primary Care Provider: [name], MD Consultants: Cardiology, Palliative Care Code Status: Full  Pt Overview and Major Events to Date:  3/30 - Admitted to FPTS for inability to tolerate PO 4/4 - R heart cath Severe mixed pulmonary HTN (WHO Group II & III) with evidence of RV failure/low output and volume overload, Rapid response also called for dec responsiveness, pt placed on Bipap and transferred to step down, improved mentation throughout the day  Assessment and Plan: Carolyn Hendricks a 81 y.o.femalepresenting with decreased po intake and abdominal pain. PMH is significant for CAD, asthma, HTN, hypothyroidism with goiter, CKDIII, DDD with spinal stenosis of lumbar region, CHF, HLD, anxiety, acute renal failure, obesity and history of tobacco use. Has had cholecystectomy and appendectomy.   CARDIOVASCULAR: CHF, right heart failure, worsened:may be cor pulmonale 2/2 COPD., however pulm HTN also noted, possible hepatopulmonary HTN component per cards notes. VQ scan negative for low prob PE.  Weights improved today, down 10 lbs (in a new bed) - cardiology consult -  right heart cath R heart cath Severe mixed pulmonary HTN (WHO Group II & III) with evidence of RV failure/low output and volume overload. - continue diuresing 80 mg IV Lasix BID, ?consider milrinone - poor prognosis, family is aware of this, patient is still full code, Palliative consult placed - avoid sedating medications  16 beats of Vtach - overnight 4/4, asymptomatic - repleting mag, checking phos, repleting K - monitor  HTN: Pressures elevated overnight 157-168/67/77. At home on Imdur 30 mg daily, Lopressor 100 mg BID, and Norvasc 10 mg daily .  -Continue home Imdur and Lopressor.  - Hold Norvasc for now in setting  of leg swelling  - Continue Lasix 80 mg IV BID - Hydral PRN high pressures - Monitor blood pressures  CAD, stable: Troponins have been elevated to 0.09 in the past. Troponins trended down 0.12 > 0.09> low at 0.06 in rapid response yesterday  - continue home aspirin, Zocor 20 mg and Plavix   Anemia acute on chronic, improving: Hgb stable 9.0 from 8.6 yesterday. Iron low at 11, TIBC WNL, saturation low at 3%. Consistent with anemia of chronic disease.  Reticulocytes WNL at 2.1  -  FeSO4 supplementation 325 mg QD started 4/3 - transfusion threshold 8 - recheck AM CBC  PULMONARY: Acute on Chronic Respiratory Failure:Likely a chronic pCO2 retainer, however CO2 elevated at 57.8 and 60.0 with poor mentation yesterday 4/4.  Patient was placed on Bipap and remained somnolent, only waking for moments at a time. She was subsequently taken off Bipap and placed on home O2 by [place] and woke up, became more alert and oriented. Likely was fighting the bipap.  - At home on Advair diskus. Not on formulary but ordered as Breo Ellipta.  -Continue Breo Ellipta 1 puff daily - Duonebs PRN - continue home O2 by [place] (2L)   GASTROINTESTINAL: Abdominal pain/nausea/vomiting/decreased po intake, improved: Uncertain etiology. Hepatitis less likely with negative Hep panel. Post-cholecystectomy diarrhea considered (lap chole 11/2015).  GI panel negative. TSH WNL. - started cholestyramine (4g packet Qd) -Tylenol 650 mg Q6 prn for pain  - Zofran 4 mg Q6 prn for nausea   GENITOURINARY/RENAL: Acute on chronic Renal Failure, resolved:SCr 1.02 from 3.49 on admission. Likely dehydration from decreased PO over last several days. Baseline Cr ~0.9. With CKD III.  -  AM BMET   CKD HWE:XHBZJIR with [place].  -Continue home sodium bicarb 650 mg daily   NEURO: Somnolence, new, improved:  - noted 4/4, subsequent rapid response called - likely 2/2 CO2 retention, mentation improved with Bipap - continue to  monitor mental status - avoid sedating medications  Weakness, generalized:likely related to poor PO intake although degenerative disc disease is probably contributing to history of falls. Has had recent fall but did not hit head. No LOC.  - PT/OT : SNF (CSW consult placed)  PSYCH: Anxiety/Depression:At home on Zoloft. Per daughter, used to take Ativan but no longer is on it.  -Continue Zoloft 50 mg po at bedtime - can consider stopping based on mentation  Insomnia. At home on Trazodone 100 mg prn at bedtime.  - Holding home trazodone given decreased mentation, can restart when more alert  ENDOCRINE: Hypothyroidism, stable.Last TSH 0.639 and wnl, on 07/01/2016. At home on Synthroid.  -Continue home Synthroid 75 mcg    MUSCULOSKELETAL DDD with spinal stenosis of lumbar region, stable. Complaining of back pain on admission but this is not new. Denies acute symptoms. At home on Norco 10-325 Q4 PRN.  -Continue home pain med   FEN/GI: Mag low at 1.5, repleted and recheck this PM. Potassium repleted. Goal mag>2, K>4.   PPx: heparin  Disposition: SNF  Subjective:  Patient sitting up, alert and oriented, mentation significantly improved from yesterday. She notes some chest pressure that is not new, no other complaints .Breathing improved.  Objective: Temp:  [98 F (36.7 C)-99.2 F (37.3 C)] 98 F (36.7 C) (04/05 0836) Pulse Rate:  [59-75] 72 (04/05 0836) Resp:  [11-28] 24 (04/05 0836) BP: (138-198)/(67-109) 138/94 (04/05 0836) SpO2:  [97 %-100 %] 98 % (04/05 0836) FiO2 (%):  [40 %] 40 % (04/04 1600) Weight:  [91.2 kg (201 lb)] 91.2 kg (201 lb) (04/05 0405) Physical Exam: General: NAD, rests comfortably, pleasant and interactive Cardiovascular: RRR, no m/r/g Respiratory: +diminished breath sounds on the left, mild crackles diffusely, no wheezes Abdomen: soft and nontender, nondistended Extremities: 2+ pitting edema and chronic bil shin skin changes  I/O last 3  completed shifts: In: 480 [P.O.:480] Out: 3745 [Urine:3745] No intake/output data recorded.   Filed Weights   01/09/17 0834 01/10/17 0500 01/12/17 0405  Weight: 100.8 kg (222 lb 4.8 oz) 101 kg (222 lb 11.2 oz) 91.2 kg (201 lb)    Laboratory:  Recent Labs Lab 01/10/17 0422 01/11/17 0417 01/12/17 0230  WBC 3.9* 5.7 5.6  HGB 8.2* 8.6* 9.0*  HCT 32.1* 33.3* 33.4*  PLT 120* 138* 118*    Recent Labs Lab 01/08/17 0333 01/09/17 0323  01/11/17 0417 01/11/17 1149 01/12/17 0230  NA 142 144  < > 145 145 143  K 4.3 4.0  < > 4.0 5.2* 3.7  CL 103 105  < > 105 108 95*  CO2 33* 31  < > 33* 29 38*  BUN 35* 28*  < > _0 CREATININE 1.86* 1.59*  < > 1.05* 1.07* 1.02*  CALCIUM 8.6* 8.5*  < > 9.2 8.6* 8.9  PROT 5.6* 5.7*  --   --  4.7*  --   BILITOT 0.9 0.9  --   --  1.0  --   ALKPHOS 117 101  --   --  67  --   ALT 48 39  --   --  25  --   AST 51* 40  --   --  32  --  GLUCOSE 140* 104*  < > 111* 98 84  < > = values in this interval not displayed.  Hepatitis panel negative. HIV nonreactive. FOBT negative C diff negative Ferritin 27, Fe 11, TIBC 428  Rapid Response Labs 4/4 Troponins 0.12>0.10>0.09>0.06 7.343/60/31.7 LA 1.99>1.29>1.4  Imaging/Diagnostic Tests: Dg Chest Port 1 View 01/06/2017 FINDINGS: There is airspace consolidation with volume loss the right lower lobe. There is a right pleural effusion. Left lung is clear. Heart is enlarged with pulmonary vascularity within normal limits. No adenopathy. There is atherosclerotic calcification in the aorta. There is degenerative change in the shoulders.  IMPRESSION: Airspace consolidation with volume loss in the right lower lobe. Suspect pneumonia. There is cardiomegaly with small right pleural effusion. No frank edema. Left lung clear. There is aortic atherosclerosis.   Ct Abdomen Pelvis W Contrast 12/11/2016  IMPRESSION:  1. Nonspecific presacral stranding, with question of underlying soft tissue inflammation about the  rectum. Would correlate clinically for any evidence of proctitis.  2. Mildly nodular contour of the liver raises concern for mild hepatic cirrhosis. Trace ascites noted about the liver.  3. Small right and trace left pleural effusions. Right basilar atelectasis noted.  4. Mild cardiomegaly.  5. Focal scarring at the midportion of the right kidney.  6. Scattered diverticulosis along the sigmoid colon, without evidence of diverticulitis.  7. Ectatic abdominal aorta at risk for aneurysm development. Recommend followup by ultrasound in 5 years. This recommendation follows ACR consensus guidelines: White Paper of the ACR Incidental Findings Committee II on Vascular Findings. J Am Coll Radiol 2013; 10:789-794. 8. Likely mild luminal narrowing along the common iliac arteries bilaterally, secondary to calcific atherosclerotic disease. Scattered aortic atherosclerosis.  9. Mild right convex thoracolumbar scoliosis. Partial chronic osseous fusion at L1-L2.  10. Multiple calcified uterine fibroids noted.8   Cardiac Echo Study Conclusions - Procedure narrative: Transthoracic echocardiography. Image quality was adequate. The study was technically difficult, as a result of body habitus. - Left ventricle: The cavity size was normal. Wall thickness was increased in a pattern of mild LVH. Systolic function was normal. The estimated ejection fraction was in the range of 60% to 65%. Wall motion was normal; there were no regional wall motion abnormalities. Doppler parameters are consistent with abnormal left ventricular relaxation (grade 1 diastolic dysfunction). The Ee&' ratio is between 8-15, suggesting indeterminate LV filling pressure. - Ventricular septum: Septal motion showed paradox. The contour showed diastolic flattening and systolic flattening. This suggests pressure and volume overload of the RV. - Mitral valve: Calcified annulus. Mildly thickened leaflets . There was  trivial regurgitation. - Left atrium: The atrium was normal in size. - Right ventricle: The cavity size was moderately dilated. Mildly reduced systolic function. - Right atrium: Severely dilated. - Tricuspid valve: There was moderate regurgitation. - Pulmonary arteries: PA peak pressure: 75 mm Hg (S). - Inferior vena cava: The vessel was dilated. The respirophasic diameter changes were blunted (<50%), consistent with elevated central venous pressure.  Impressions: - Technically difficult study. LVEF 60-65%, mild LVH, normal wall motion, grade 1 DD with indeterminate LV filling pressure, moderate RAE with mildly reduced RV systolic function, ventricular septal flattening (Septal-D sign) and paradoxical motion consistent with pressure and volume overload of the RV, severe RAE, moderate TR, RVSP 75 mmHg, dilated IVC (but no clear flow reversal in the hepatic vein), no pericardial effusion.  Nm Pulmonary Perf And Vent 01/11/2017 IMPRESSION: Matching ventilation and perfusion defects in the posterior segment right upper lobe. No other appreciable perfusion defect. Relative ventilation  defect in the right base in an area of infiltrate seen on chest radiograph. Cardiomegaly noted. Based on PIOPED II criteria, this study constitutes a low probability of pulmonary embolus. Electronically Signed   By: Lowella Grip III M.D.   On: 01/11/2017 09:05   Dg Chest Port 1 View 01/11/2017 FINDINGS: Cardiac silhouette markedly enlarged, unchanged. Thoracic aorta atherosclerotic, unchanged. Hilar and mediastinal contours otherwise unremarkable. Interval resolution of the interstitial pulmonary edema since the examination earlier today. Persistent small bilateral pleural effusions, right greater than left. Mild passive atelectasis in the right lower lobe with improved aeration since earlier in the day. No new abnormalities.  IMPRESSION:  1. Resolution of interstitial pulmonary edema.  2.  Persistent small bilateral pleural effusions, right greater than left.  3. Mild passive atelectasis in the right lower lobe with improved aeration since earlier in the day.  4. No new abnormalities.    Dg Chest Port 1 View 01/11/2017 (1 PM) IMPRESSION: No significant change since the prior exam. Persistent effusions, right more than left. Atelectasis at the right lung base. Aortic atherosclerosis.     Everrett Coombe, MD 01/12/2017, 9:56 AM PGY-1, [place] Intern pager: [phone], text pages welcome

## 2017-01-12 NOTE — Consult Note (Signed)
Consultation Note Date: 01/12/2017   Patient Name: Carolyn Hendricks  DOB: 1936/08/05  MRN: [id number]  Age / Sex: 81 y.o., female  PCP: Birdie Sons, MD Referring Physician: Dickie La, MD  Reason for Consultation: Establishing goals of care with severe right heart failure and poor prognosis.   HPI/Patient Profile: 81 y.o. female  with past medical history of CAD, asthma, HTN, hypothyroidism with goiter, CKDIII, DDD with spinal stenosis of lumbar region, CHF, HLD, anxiety, acute renal failure, obesity and history of tobacco use admitted on 01/06/2017 with decreased po intake and abdominal pain. Found to have right heart failure with severe pulmonary hypertension.   Clinical Assessment and Goals of Care: I met today with Ms. Kable and her daughter Hilda Blades at bedside. They have good understanding of her poor prognosis and are both tearful. Hilda Blades has lived and helped her mother for a long time now and says she is her best friend.   Ms. Koffler begins to talk about EOL and has faith and peace that she is ready when her time comes to die. She is tearful and sad to think of leaving her family but thankful for the years she has had with them. She talks of being tired and suffering and cries out for God to take her. As she was speaking her brother and sister-in-law came in to visit. They visited with Ms. Mccormick as I spoke more with Hilda Blades.  Hilda Blades is tearful but we discussed the importance of having some difficult discussions and decisions. We discussed HCPOA (Ms. Shankles has said that she wants Hilda Blades to be her HCPOA). We discussed code status and possible hospice. Hilda Blades says these topics need to be further discussed with Ms. Chamblin and Hilda Blades wants to support her mother's wishes. Hilda Blades also helps care for her brother who has had a stroke so they have discussed SNF rehab post discharge. Hilda Blades is concerned about one of her sisters  Ivin Booty) who she says is in denial.   Left them to visit as Ms. Sandeen was enjoying her company and this was lifting her spirits. Palliative to follow up tomorrow.   Primary Decision Maker HCPOA is in process - Ms. Leavens wishes for daughter Hilda Blades    SUMMARY OF RECOMMENDATIONS   - More discussion needed for patient  Code Status/Advance Care Planning:  Full code - to be further discussed   Symptom Management:   Weakness: May require SNF rehab as her daughter cares for her brother as well.   Appetite is slowly improving.   Palliative Prophylaxis:   Delirium Protocol and Frequent Pain Assessment   Psycho-social/Spiritual:   Desire for further Chaplaincy support:yes  Additional Recommendations: Caregiving  Support/Resources, Education on Hospice and Grief/Bereavement Support  Prognosis:   < 6 months very likely with RV failure and pulmonary hypertension.   Discharge Planning: To Be Determined      Primary Diagnoses: Present on Admission: . Decreased oral intake . Pulmonary hypertension due to COPD [facility])   I have reviewed the medical record, interviewed the  patient and family, and examined the patient. The following aspects are pertinent.  Past Medical History:  Diagnosis Date  . Arthritis   . Chest pain   . CHF (congestive heart failure) ([place])   . Chronic kidney disease   . Diastolic dysfunction   . Dizziness   . Head ache   . Heart murmur   . High blood pressure   . History of MRSA infection   . Renal insufficiency   . Shortness of breath    Social History   Social History  . Marital status: Widowed    Spouse name: N/A  . Number of children: 4  . Years of education: 12   Occupational History  . retired    Social History Main Topics  . Smoking status: Former Smoker    Packs/day: 1.00    Years: 26.00    Types: Cigarettes    Quit date: 10/11/2011  . Smokeless tobacco: Never Used     Comment: Patient has smoked for 26 years; has quit but now  started back 0.25 ppw.  . Alcohol use No  . Drug use: No  . Sexual activity: No   Other Topics Concern  . None   Social History Narrative  . None   Family History  Problem Relation Age of Onset  . Congestive Heart Failure Mother 17  . Cancer Brother   . Kidney failure Father    Scheduled Meds: . aspirin EC  81 mg Oral Daily  . Chlorhexidine Gluconate Cloth  6 each Topical Q0600  . cholestyramine  4 g Oral Daily  . clopidogrel  75 mg Oral q morning - 10a  . docusate sodium  100 mg Oral BID  . ferrous sulfate  325 mg Oral Daily  . fluticasone furoate-vilanterol  1 puff Inhalation Daily  . furosemide  80 mg Intravenous BID  . heparin  5,000 Units Subcutaneous Q8H  . isosorbide mononitrate  30 mg Oral Daily  . levothyroxine  75 mcg Oral QAC breakfast  . metoprolol  50 mg Oral BID  . mupirocin ointment  1 application Nasal BID  . pantoprazole  40 mg Oral Daily  . sertraline  50 mg Oral QHS  . simvastatin  20 mg Oral q1800  . sodium bicarbonate  650 mg Oral TID  . sodium chloride flush  3 mL Intravenous Q12H   Continuous Infusions: PRN Meds:.sodium chloride, sodium chloride, acetaminophen **OR** acetaminophen, fentaNYL (SUBLIMAZE) injection, hydrALAZINE, HYDROcodone-acetaminophen, ondansetron **OR** ondansetron (ZOFRAN) IV, sodium chloride flush, technetium TC 54M diethylenetriame-pentaacetic acid Allergies  Allergen Reactions  . Atorvastatin Other (See Comments)    Other reaction(s): Muscle Pain  . Crestor [Rosuvastatin Calcium] Other (See Comments)    Other reaction(s): Muscle Pain  . Tape Other (See Comments)    Makes a bruise on my skin. Paper tape is ok to use.  . Pravastatin Sodium Other (See Comments)    Muscle pain   Review of Systems  Constitutional: Positive for activity change and fatigue.  Neurological: Positive for weakness.    Physical Exam  Constitutional: She is oriented to person, place, and time. She appears well-developed.  HENT:  Head:  Normocephalic and atraumatic.  Cardiovascular: Normal rate.   Pulmonary/Chest: Effort normal. No accessory muscle usage. No tachypnea. No respiratory distress.  Abdominal: Normal appearance.  Neurological: She is alert and oriented to person, place, and time.  Nursing note and vitals reviewed.   Vital Signs: BP (!) 138/94 (BP Location: Right Arm)   Pulse 72  Temp 98 F (36.7 C) (Oral)   Resp (!) 24   Ht 5' (1.524 m)   Wt 91.2 kg (201 lb)   SpO2 96%   BMI 39.26 kg/m  Pain Assessment: No/denies pain POSS *See Group Information*: S-Acceptable,Sleep, easy to arouse Pain Score: 0-No pain   SpO2: SpO2: 96 % O2 Device:SpO2: 96 % O2 Flow Rate: .O2 Flow Rate (L/min): 2 L/min  IO: Intake/output summary:  Intake/Output Summary (Last 24 hours) at 01/12/17 1304 Last data filed at 01/12/17 0400  Gross per 24 hour  Intake              240 ml  Output             3745 ml  Net            -3505 ml    LBM: Last BM Date: 01/10/17 Baseline Weight: Weight: 91.6 kg (202 lb) Most recent weight: Weight: 91.2 kg (201 lb)     Palliative Assessment/Data:      Time Total: 42mn  Greater than 50%  of this time was spent counseling and coordinating care related to the above assessment and plan.  Signed by: AVinie Sill NP Palliative Medicine Team Pager # [phone](M-F 8a-5p) Team Phone # [phone](Nights/Weekends)

## 2017-01-12 NOTE — Progress Notes (Signed)
Progress Note  Patient Name: Carolyn Hendricks Date of Encounter: 01/12/2017  Primary Cardiologist: Dr. Rockey Situ Dr. Fletcher Anon (PAD)  Subjective   Post RHC (indicates RV failure, poor prognosis), rapid response called at 11am secondary to respiratory distress and hypersomnolence. No sedation on cath. Ammonia level 37. Bipap started. Improved.   Inpatient Medications    Scheduled Meds: . aspirin EC  81 mg Oral Daily  . Chlorhexidine Gluconate Cloth  6 each Topical Q0600  . cholestyramine  4 g Oral Daily  . clopidogrel  75 mg Oral q morning - 10a  . docusate sodium  100 mg Oral BID  . ferrous sulfate  325 mg Oral Daily  . fluticasone furoate-vilanterol  1 puff Inhalation Daily  . furosemide  80 mg Intravenous BID  . heparin  5,000 Units Subcutaneous Q8H  . isosorbide mononitrate  30 mg Oral Daily  . levothyroxine  75 mcg Oral QAC breakfast  . magnesium sulfate 1 - 4 g bolus IVPB  2 g Intravenous Once  . metoprolol  50 mg Oral BID  . mupirocin ointment  1 application Nasal BID  . pantoprazole  40 mg Oral Daily  . sertraline  50 mg Oral QHS  . simvastatin  20 mg Oral q1800  . sodium bicarbonate  650 mg Oral TID  . sodium chloride flush  3 mL Intravenous Q12H   Continuous Infusions:  PRN Meds: sodium chloride, sodium chloride, acetaminophen **OR** acetaminophen, fentaNYL (SUBLIMAZE) injection, hydrALAZINE, HYDROcodone-acetaminophen, ondansetron **OR** ondansetron (ZOFRAN) IV, sodium chloride flush, technetium TC 82M diethylenetriame-pentaacetic acid   Vital Signs    Vitals:   01/12/17 0000 01/12/17 0015 01/12/17 0405 01/12/17 0836  BP: (!) 164/77 (!) 164/77 (!) 168/76 (!) 138/94  Pulse: 62 (!) 59 68 72  Resp: 17   (!) 24  Temp:  98.1 F (36.7 C) 99.2 F (37.3 C) 98 F (36.7 C)  TempSrc:  Oral Oral Oral  SpO2: 99% 99% 100% 98%  Weight:   201 lb (91.2 kg)   Height:        Intake/Output Summary (Last 24 hours) at 01/12/17 1024 Last data filed at 01/12/17 0400  Gross per  24 hour  Intake              240 ml  Output             3745 ml  Net            -3505 ml   Filed Weights   01/09/17 0834 01/10/17 0500 01/12/17 0405  Weight: 222 lb 4.8 oz (100.8 kg) 222 lb 11.2 oz (101 kg) 201 lb (91.2 kg)    Telemetry    No adverse arrhythmia - Personally Reviewed  ECG    SR LBBB - Personally Reviewed  Physical Exam   GEN: Elderly, obese, moderately ill appearing.   Neck: + JVD Cardiac: RRR,ectopy, soft murmur,no rubs, or gallops.  Respiratory: Clear to auscultation bilaterally. GI: Soft, nontender, non-distended  MS: No edema; No deformity. +edema 2+ Neuro:  Nonfocal  Psych: Normal affect   Labs    Chemistry Recent Labs Lab 01/08/17 0333 01/09/17 0323  01/11/17 0417 01/11/17 1149 01/12/17 0230  NA 142 144  < > 145 145 143  K 4.3 4.0  < > 4.0 5.2* 3.7  CL 103 105  < > 105 108 95*  CO2 33* 31  < > 33* 29 38*  GLUCOSE 140* 104*  < > 111* 98 84  BUN 35* 28*  < >  _0 CREATININE 1.86* 1.59*  < > 1.05* 1.07* 1.02*  CALCIUM 8.6* 8.5*  < > 9.2 8.6* 8.9  PROT 5.6* 5.7*  --   --  4.7*  --   ALBUMIN 3.5 3.5  --   --  3.1*  --   AST 51* 40  --   --  32  --   ALT 48 39  --   --  25  --   ALKPHOS 117 101  --   --  67  --   BILITOT 0.9 0.9  --   --  1.0  --   GFRNONAA 25* 30*  < > 49* 48* 51*  GFRAA 29* 35*  < > 57* 56* 59*  ANIONGAP 6 8  < > _1 < > = values in this interval not displayed.   Hematology Recent Labs Lab 01/10/17 0422 01/10/17 1848 01/11/17 0417 01/12/17 0230  WBC 3.9*  --  5.7 5.6  RBC 3.93 4.29 4.15 4.19  HGB 8.2*  --  8.6* 9.0*  HCT 32.1*  --  33.3* 33.4*  MCV 81.7  --  80.2 79.7  MCH 20.9*  --  20.7* 21.5*  MCHC 25.5*  --  25.8* 26.9*  RDW 19.5*  --  19.5* 20.2*  PLT 120*  --  138* 118*    Cardiac Enzymes Recent Labs Lab 01/06/17 1658 01/06/17 2219 01/07/17 0440 01/11/17 1403  TROPONINI 0.12* 0.10* 0.09* 0.06*   No results for input(s): TROPIPOC in the last 168 hours.   BNP Recent Labs Lab  01/06/17 1658  BNP >4,500.0*     DDimer No results for input(s): DDIMER in the last 168 hours.   Radiology    Dg Chest 2 View  Result Date: 01/10/2017 CLINICAL DATA:  81 year old with chronic cough, shortness of breath and mid chest pain over the past several weeks. Current history of congestive heart failure and stage 3 chronic kidney disease. EXAM: CHEST  2 VIEW COMPARISON:  01/06/2017, 07/02/2016 and earlier. FINDINGS: Cardiac silhouette markedly enlarged, unchanged. Thoracic aorta tortuous and atherosclerotic, unchanged. Interval development of mild diffuse interstitial pulmonary edema. Bilateral pleural effusions, right greater than left, with associated passive atelectasis in the lower lobes. Degenerative changes and DISH involving the thoracic spine. IMPRESSION: 1. Mild CHF, with stable marked cardiomegaly and mild diffuse interstitial pulmonary edema. 2. Bilateral pleural effusions, right greater than left, with associated passive atelectasis in the lower lobes. 3. Thoracic aortic atherosclerosis. Electronically Signed   By: Evangeline Dakin M.D.   On: 01/10/2017 17:08   Nm Pulmonary Perf And Vent  Result Date: 01/11/2017 CLINICAL DATA:  Shortness of breath and chest pain EXAM: NUCLEAR MEDICINE VENTILATION - PERFUSION LUNG SCAN VIEWS: Anterior, posterior, left lateral, right lateral, RPO, LPO, RAO, LAO -ventilation and profusion RADIOPHARMACEUTICALS:  30.4 mCi Technetium-60mDTPA aerosol inhalation and 4.2 mCi Technetium-962mAA IV COMPARISON:  Chest radiograph January 10, 2017 FINDINGS: Ventilation: There is relative decreased radiotracer uptake in the posterior segment of the right upper lobe and in the right base regions. Chest radiograph shows infiltrate in the right base and atelectatic change in the right upper lobe. Elsewhere, the distribution of uptake on the ventilation study appears unremarkable. There is cardiomegaly. Perfusion: For fusion is appears essentially normal in the right base  in the area of relative decreased uptake in this area on the ventilation study. There is a matching perfusion defect with respect to the ventilation study in the posterior segment of the right  upper lobe. There is no appreciable perfusion defect without corresponding ventilation defect. Cardiomegaly noted. IMPRESSION: Matching ventilation and perfusion defects in the posterior segment right upper lobe. No other appreciable perfusion defect. Relative ventilation defect in the right base in an area of infiltrate seen on chest radiograph. Cardiomegaly noted. Based on PIOPED II criteria, this study constitutes a low probability of pulmonary embolus. Electronically Signed   By: Lowella Grip III M.D.   On: 01/11/2017 09:05   Dg Chest Port 1 View  Result Date: 01/11/2017 CLINICAL DATA:  81 year old with acute onset shortness of breath. Follow-up CHF, effusions and right basilar atelectasis. EXAM: PORTABLE CHEST 1 VIEW 1706 hours: COMPARISON:  Portable chest x-ray earlier today 1138 hours, chest x-rays yesterday, 01/06/2017 and earlier. FINDINGS: Cardiac silhouette markedly enlarged, unchanged. Thoracic aorta atherosclerotic, unchanged. Hilar and mediastinal contours otherwise unremarkable. Interval resolution of the interstitial pulmonary edema since the examination earlier today. Persistent small bilateral pleural effusions, right greater than left. Mild passive atelectasis in the right lower lobe with improved aeration since earlier in the day. No new abnormalities. IMPRESSION: 1. Resolution of interstitial pulmonary edema. 2. Persistent small bilateral pleural effusions, right greater than left. 3. Mild passive atelectasis in the right lower lobe with improved aeration since earlier in the day. 4. No new abnormalities. Electronically Signed   By: Evangeline Dakin M.D.   On: 01/11/2017 17:24   Dg Chest Port 1 View  Result Date: 01/11/2017 CLINICAL DATA:  Sudden onset of shortness of breath this morning.  Altered mental status. EXAM: PORTABLE CHEST 1 VIEW COMPARISON:  01/10/2017 and 01/06/2017 FINDINGS: There is chronic cardiomegaly. Pulmonary vascularity is within normal limits. Moderate right pleural effusion. Atelectasis at the right lung base. The left lung is clear except for a small left effusion. IMPRESSION: No significant change since the prior exam. Persistent effusions, right more than left. Atelectasis at the right lung base. Aortic atherosclerosis. Electronically Signed   By: Lorriane Shire M.D.   On: 01/11/2017 11:48    Cardiac Studies   RHC: Findings:  RA = 21 RV = 78/26 PA = 81/36 (51) PCW = 29 Fick cardiac output/index = 3.8/1.96 PVR = 5.8 Ao sat = 98% PA sat = 39%, 43% RA/PCWP = 0.72 PAPi = 2.14  Assessment: 1. Severe mixed pulmonary HTN (WHO Group II & III) with evidence of RV failure/low output and volume overload  Plan/Discussion:  Based on hemodynamics overall prognosis is poor. Would proceed with IV diuresis. Will need to optimize respiratory status with CPAP/BiPAP as tolerated. Due to somnolence serum ammonia drawn in lab. Could consider IV milrinone as last resort therapy for RV support but she seems to be poor candidate for home inotropes. Consider Palliative Care involvement.   Glori Bickers, MD   Patient Profile     81 y.o. female with severe mixed pulmonary hypertension with a history of HTN, tobacco abuse, atrial tachycardia, chronic anemia,carotid artery disease s/p L CEA, PAD, COPD on 2L home 02, LBBB, non obst CAD, CKD, chronic diastolic CHFand chronic LE edema who [facility]17 with abdominal pain, N/V, decreased PO intake.  Assessment & Plan    Right heart failure with severe pulmonary hypertension  - Both from left heart and lung processes.   - With PCW of 29, keep diuresis as tolerated with IV lasix. Out 3 L yesterday for net out 1.2L  - Poor prognosis. Palliation. Had long discussion with family. Pastor in room.    AKI  - improved  COPD  -  O2. Chronic CO2 retainer (hypercapnic)  PVD  - stable  Demand ischemia  - Cath 2015 50% LAD otherwise normal.   Will follow.  Signed, Candee Furbish, MD  01/12/2017, 10:24 AM

## 2017-01-12 NOTE — Progress Notes (Signed)
Occupational Therapy Treatment Patient Details Name: Carolyn Hendricks MRN: [id number] DOB: 01/16/1936 Today's Date: 01/12/2017    History of present illness [name] a 81 y.o.femalepresenting with decreased po intake and abdominal pain. Pt with increased WOB following heart cath 4/4 requiring bipap and transfer to SDU. PMH is significant for CAD, asthma, HTN, hypothyroidism with goiter, CKDIII, DDD with spinal stenosis of lumbar region, CHF, HLD, anxiety, acute renal failure, obesity and history of tobacco use. Has had cholecystectomy and appendectomy.    OT comments  Pt requesting to walk to bathroom instead of using BSC. Pt requires increased time for all mobility and needs frequent encouragement throughout session as she was apologetic about needing assistance. Pt with decreased activity tolerance. Continues to be appropriate for SNF level rehab.   Follow Up Recommendations  SNF;Supervision/Assistance - 24 hour    Equipment Recommendations       Recommendations for Other Services      Precautions / Restrictions Precautions Precautions: Fall Restrictions Weight Bearing Restrictions: No       Mobility Bed Mobility Overal bed mobility: Needs Assistance Bed Mobility: Supine to Sit     Supine to sit: Mod assist;+2 for physical assistance     General bed mobility comments: HOB significantly elevated, mod A to move bilateral LEs and to elevate trunk with use of bed pads to position hips at EOB  Transfers Overall transfer level: Needs assistance Equipment used: Rolling walker (2 wheeled) Transfers: Sit to/from Stand Sit to Stand: Min guard         General transfer comment: pt able to performed sit to stand from bed x1, from toilet x1 and from recliner chair x1, all with min guard, VC'ing for bilateral hand placement    Balance Overall balance assessment: Needs assistance;History of Falls Sitting-balance support: No upper extremity supported;Feet  supported Sitting balance-Leahy Scale: Fair     Standing balance support: Bilateral upper extremity supported;During functional activity Standing balance-Leahy Scale: Poor Standing balance comment: pt reliant on bilateral UEs on RW                           ADL either performed or assessed with clinical judgement   ADL Overall ADL's : Needs assistance/impaired     Grooming: Brushing hair;Sitting;Set up                   Toilet Transfer: Minimal assistance;Ambulation;RW;Comfort height toilet;Grab bars Toilet Transfer Details (indicate cue type and reason): VC's for safety and increased time, pt requesting to sit on regular toilet Toileting- Clothing Manipulation and Hygiene: Total assistance;Sit to/from stand       Functional mobility during ADLs: Minimal assistance;Rolling walker;Cueing for safety General ADL Comments: Pt with increased anxiety and apologizing for requiring help during session.     Vision       Perception     Praxis      Cognition Arousal/Alertness: Awake/alert Behavior During Therapy: Anxious Overall Cognitive Status: Within Functional Limits for tasks assessed                                          Exercises     Shoulder Instructions       General Comments      Pertinent Vitals/ Pain       Pain Assessment: 0-10 Faces Pain Scale: Hurts little more Pain Location:  buttocks Pain Descriptors / Indicators: Sore Pain Intervention(s): Monitored during session  Home Living                                          Prior Functioning/Environment              Frequency  Min 2X/week        Progress Toward Goals  OT Goals(current goals can now be found in the care plan section)  Progress towards OT goals: Progressing toward goals  Acute Rehab OT Goals Patient Stated Goal: to get better Time For Goal Achievement: 01/22/17 Potential to Achieve Goals: Good  Plan Discharge plan  remains appropriate    Co-evaluation    PT/OT/SLP Co-Evaluation/Treatment: Yes Reason for Co-Treatment: For patient/therapist safety PT goals addressed during session: Mobility/safety with mobility;Balance;Proper use of DME OT goals addressed during session: ADL's and self-care      End of Session Equipment Utilized During Treatment: Gait belt;Rolling walker;Oxygen (2L)  OT Visit Diagnosis: Muscle weakness (generalized) (M62.81);Unsteadiness on feet (R26.81)   Activity Tolerance Patient limited by fatigue   Patient Left in chair;with call bell/phone within reach;with nursing/sitter in room;with family/visitor present   Nurse Communication  (need foley care)        Time: 7670-1100 OT Time Calculation (min): 43 min  Charges: OT General Charges $OT Visit: 1 Procedure OT Treatments $Self Care/Home Management : 8-22 mins   Malka So 01/12/2017, 10:18 AM  [phone]

## 2017-01-13 LAB — BASIC METABOLIC PANEL
ANION GAP: 11 (ref 5–15)
BUN: 14 mg/dL (ref 6–20)
CALCIUM: 8.9 mg/dL (ref 8.9–10.3)
CO2: 45 mmol/L — AB (ref 22–32)
Chloride: 86 mmol/L — ABNORMAL LOW (ref 101–111)
Creatinine, Ser: 1.08 mg/dL — ABNORMAL HIGH (ref 0.44–1.00)
GFR calc Af Amer: 55 mL/min — ABNORMAL LOW (ref >60)
GFR, EST NON AFRICAN AMERICAN: 48 mL/min — AB (ref >60)
GLUCOSE: 89 mg/dL (ref 65–99)
Potassium: 3.4 mmol/L — ABNORMAL LOW (ref 3.5–5.1)
Sodium: 142 mmol/L (ref 135–145)

## 2017-01-13 LAB — CBC
HCT: 36.8 % (ref 36.0–46.0)
Hemoglobin: 9.9 g/dL — ABNORMAL LOW (ref 12.0–15.0)
MCH: 21.3 pg — AB (ref 26.0–34.0)
MCHC: 26.9 g/dL — AB (ref 30.0–36.0)
MCV: 79.1 fL (ref 78.0–100.0)
PLATELETS: 147 10*3/uL — AB (ref 150–400)
RBC: 4.65 MIL/uL (ref 3.87–5.11)
RDW: 20.7 % — AB (ref 11.5–15.5)
WBC: 5.9 10*3/uL (ref 4.0–10.5)

## 2017-01-13 LAB — MAGNESIUM: MAGNESIUM: 1.7 mg/dL (ref 1.7–2.4)

## 2017-01-13 MED ORDER — TRAZODONE HCL 50 MG PO TABS
50.0000 mg | ORAL_TABLET | Freq: Every evening | ORAL | Status: DC | PRN
  Administered 2017-01-14 – 2017-01-16 (×4): 50 mg via ORAL
  Filled 2017-01-13 (×4): qty 1

## 2017-01-13 MED ORDER — POTASSIUM CHLORIDE CRYS ER 20 MEQ PO TBCR
40.0000 meq | EXTENDED_RELEASE_TABLET | Freq: Once | ORAL | Status: AC
  Administered 2017-01-13: 40 meq via ORAL
  Filled 2017-01-13: qty 2

## 2017-01-13 MED ORDER — MAGNESIUM SULFATE 2 GM/50ML IV SOLN
2.0000 g | Freq: Once | INTRAVENOUS | Status: AC
  Administered 2017-01-13: 2 g via INTRAVENOUS
  Filled 2017-01-13: qty 50

## 2017-01-13 NOTE — Progress Notes (Signed)
Family Medicine Teaching Service Daily Progress Note Intern Pager: [phone]  Patient name: GWENDOLYNE WELFORD Medical record number: [id number] Date of birth: July 11, 1936 Age: 81 y.o. Gender: female  Primary Care Provider: [name], MD Consultants: Cardiology, Palliative Care Code Status: Full  Pt Overview and Major Events to Date:  3/30 - Admitted to FPTS for inability to tolerate PO 4/4 - R heart cath Severe mixed pulmonary HTN (WHO Group II & III) with evidence of RV failure/low output and volume overload, Rapid response also called for dec responsiveness, pt placed on Bipap and transferred to step down, improved mentation throughout the day  Assessment and Plan: KIERRIA FEIGENBAUM a 81 y.o.femalepresenting with decreased po intake and abdominal pain. PMH is significant for CAD, asthma, HTN, hypothyroidism with goiter, CKDIII, DDD with spinal stenosis of lumbar region, CHF, HLD, anxiety, acute renal failure, obesity and history of tobacco use. Has had cholecystectomy and appendectomy.   CHF, right heart failure, worsened: - cardiology consult appreciate recs - continue IV diuresis  - palliative consult - HCPOA in process  Acute on Chronic Respiratory Failure, improved - At home on Advair diskus. Not on formulary but ordered as Breo Ellipta.  -Continue Breo Ellipta 1 puff daily - Duonebs PRN - continue home O2 by [place] (2L)  - mag repleted, AM mag pending  HTN, improved - At home on Imdur 30 mg daily, Lopressor 100 mg BID, and Norvasc 10 mg daily .  -Continue home Imdur and Lopressor.  - Hold Norvasc for now in setting of leg swelling  - Continue Lasix 80 mg IV BID - Hydral PRN high pressures - Monitor blood pressuress  Abdominal pain/nausea/vomiting/decreased po intake, improved: Uncertain etiology. Post-cholecystectomy diarrhea considered (lap chole 11/2015) - started cholestyramine (4g packet Qd) -Tylenol 650 mg Q6 prn for pain  - Zofran 4 mg Q6 prn for nausea    CAD, stable: Troponins trended down 0.12 > 0.09> 0.06 - continue home aspirin, Zocor 20 mg and Plavix   Anemia acute on chronic, improving: Hgb stable 9.9 from 9.0.  -  FeSO4 supplementation 325 mg QD started 4/3 - transfusion threshold 8 - AM CBC  Acute on chronic Renal Failure, resolved:SCr 1.08 from 3.49 on admission. Likely dehydration from decreased PO over last several days. Baseline Cr ~0.9. With CKD III.  - AM BMET  -Continue home sodium bicarb 650 mg daily   Weakness, generalized:likely related to poor PO intake although degenerative disc disease is probably contributing to history of falls. Has had recent fall but did not hit head. No LOC.  - PT/OT : SNF (CSW consult placed)  Anxiety/Depression:At home on Zoloft. Per daughter, used to take Ativan but no longer is on it.  -Continue Zoloft 50 mg po at bedtime - can consider stopping based on mentation  Insomnia. At home on Trazodone 100 mg prn at bedtime.  - Holding home trazodone given decreased mentation, can restart when more alert  Hypothyroidism, stable.Last TSH 0.639 and wnl, on 07/01/2016. At home on Synthroid.  -Continue home Synthroid 75 mcg   DDD with spinal stenosis of lumbar region, stable. Complaining of back pain on admission but this is not new. Denies acute symptoms. At home on Norco 10-325 Q4 PRN.  -Continue home pain med  FEN/GI:  Goal mag>2, K>4.   PPx: heparin  Disposition: SNF, pending placement  Subjective:  Patient sitting up comfortably in chair this AM, son in law present. She converse normally, she is alert and oriented.  Feels breathing has improved  but not at baseline.  Objective: Temp:  [97.4 F (36.3 C)-99.1 F (37.3 C)] 98.5 F (36.9 C) (04/06 1251) Pulse Rate:  [67-77] 68 (04/06 1251) Resp:  [15-25] 16 (04/06 1251) BP: (109-166)/(51-98) 109/98 (04/06 1251) SpO2:  [98 %-100 %] 99 % (04/06 1251) Weight:  [193 lb (87.5 kg)] 193 lb (87.5 kg) (04/06 0540) Physical  Exam: General: NAD, rests comfortably, pleasant and interactive Cardiovascular: RRR, no m/r/g Respiratory: +diminished breath sounds on the left, +mild crackles diffusely, +mild rhonchi Abdomen: soft and nontender, nondistended Extremities: 2+ pitting edema and chronic bil shin skin changes  I/O last 3 completed shifts: In: 120 [P.O.:120] Out: 7345 [Urine:7345] Total I/O In: -  Out: 1700 [Urine:1700]   Filed Weights   01/10/17 0500 01/12/17 0405 01/13/17 0540  Weight: 222 lb 11.2 oz (101 kg) 201 lb (91.2 kg) 193 lb (87.5 kg)    Laboratory:  Recent Labs Lab 01/11/17 0417 01/12/17 0230 01/13/17 0201  WBC 5.7 5.6 5.9  HGB 8.6* 9.0* 9.9*  HCT 33.3* 33.4* 36.8  PLT 138* 118* 147*    Recent Labs Lab 01/08/17 0333 01/09/17 0323  01/11/17 1149 01/12/17 0230 01/13/17 0201  NA 142 144  < > 145 143 142  K 4.3 4.0  < > 5.2* 3.7 3.4*  CL 103 105  < > 108 95* 86*  CO2 33* 31  < > 29 38* 45*  BUN 35* 28*  < > _0 CREATININE 1.86* 1.59*  < > 1.07* 1.02* 1.08*  CALCIUM 8.6* 8.5*  < > 8.6* 8.9 8.9  PROT 5.6* 5.7*  --  4.7*  --   --   BILITOT 0.9 0.9  --  1.0  --   --   ALKPHOS 117 101  --  67  --   --   ALT 48 39  --  25  --   --   AST 51* 40  --  32  --   --   GLUCOSE 140* 104*  < > 98 84 89  < > = values in this interval not displayed.  Hepatitis panel negative. HIV nonreactive. FOBT negative C diff negative Ferritin 27, Fe 11, TIBC 428  Rapid Response Labs 4/4 Troponins 0.12>0.10>0.09>0.06 7.343/60/31.7 LA 1.99>1.29>1.4  Imaging/Diagnostic Tests: Dg Chest Port 1 View 01/06/2017 FINDINGS: There is airspace consolidation with volume loss the right lower lobe. There is a right pleural effusion. Left lung is clear. Heart is enlarged with pulmonary vascularity within normal limits. No adenopathy. There is atherosclerotic calcification in the aorta. There is degenerative change in the shoulders.  IMPRESSION: Airspace consolidation with volume loss in the right  lower lobe. Suspect pneumonia. There is cardiomegaly with small right pleural effusion. No frank edema. Left lung clear. There is aortic atherosclerosis.   Ct Abdomen Pelvis W Contrast 12/11/2016  IMPRESSION:  1. Nonspecific presacral stranding, with question of underlying soft tissue inflammation about the rectum. Would correlate clinically for any evidence of proctitis.  2. Mildly nodular contour of the liver raises concern for mild hepatic cirrhosis. Trace ascites noted about the liver.  3. Small right and trace left pleural effusions. Right basilar atelectasis noted.  4. Mild cardiomegaly.  5. Focal scarring at the midportion of the right kidney.  6. Scattered diverticulosis along the sigmoid colon, without evidence of diverticulitis.  7. Ectatic abdominal aorta at risk for aneurysm development. Recommend followup by ultrasound in 5 years. This recommendation follows ACR consensus guidelines: White Paper of the ACR Incidental Findings Committee  II on Vascular Findings. J Am Coll Radiol 2013; 10:789-794. 8. Likely mild luminal narrowing along the common iliac arteries bilaterally, secondary to calcific atherosclerotic disease. Scattered aortic atherosclerosis.  9. Mild right convex thoracolumbar scoliosis. Partial chronic osseous fusion at L1-L2.  10. Multiple calcified uterine fibroids noted.8   Cardiac Echo Study Conclusions - Procedure narrative: Transthoracic echocardiography. Image quality was adequate. The study was technically difficult, as a result of body habitus. - Left ventricle: The cavity size was normal. Wall thickness was increased in a pattern of mild LVH. Systolic function was normal. The estimated ejection fraction was in the range of 60% to 65%. Wall motion was normal; there were no regional wall motion abnormalities. Doppler parameters are consistent with abnormal left ventricular relaxation (grade 1 diastolic dysfunction). The Ee&' ratio is between  8-15, suggesting indeterminate LV filling pressure. - Ventricular septum: Septal motion showed paradox. The contour showed diastolic flattening and systolic flattening. This suggests pressure and volume overload of the RV. - Mitral valve: Calcified annulus. Mildly thickened leaflets . There was trivial regurgitation. - Left atrium: The atrium was normal in size. - Right ventricle: The cavity size was moderately dilated. Mildly reduced systolic function. - Right atrium: Severely dilated. - Tricuspid valve: There was moderate regurgitation. - Pulmonary arteries: PA peak pressure: 75 mm Hg (S). - Inferior vena cava: The vessel was dilated. The respirophasic diameter changes were blunted (<50%), consistent with elevated central venous pressure.  Impressions: - Technically difficult study. LVEF 60-65%, mild LVH, normal wall motion, grade 1 DD with indeterminate LV filling pressure, moderate RAE with mildly reduced RV systolic function, ventricular septal flattening (Septal-D sign) and paradoxical motion consistent with pressure and volume overload of the RV, severe RAE, moderate TR, RVSP 75 mmHg, dilated IVC (but no clear flow reversal in the hepatic vein), no pericardial effusion.  Nm Pulmonary Perf And Vent 01/11/2017 IMPRESSION: Matching ventilation and perfusion defects in the posterior segment right upper lobe. No other appreciable perfusion defect. Relative ventilation defect in the right base in an area of infiltrate seen on chest radiograph. Cardiomegaly noted. Based on PIOPED II criteria, this study constitutes a low probability of pulmonary embolus. Electronically Signed   By: Lowella Grip III M.D.   On: 01/11/2017 09:05   Dg Chest Port 1 View 01/11/2017 FINDINGS: Cardiac silhouette markedly enlarged, unchanged. Thoracic aorta atherosclerotic, unchanged. Hilar and mediastinal contours otherwise unremarkable. Interval resolution of the interstitial  pulmonary edema since the examination earlier today. Persistent small bilateral pleural effusions, right greater than left. Mild passive atelectasis in the right lower lobe with improved aeration since earlier in the day. No new abnormalities.  IMPRESSION:  1. Resolution of interstitial pulmonary edema.  2. Persistent small bilateral pleural effusions, right greater than left.  3. Mild passive atelectasis in the right lower lobe with improved aeration since earlier in the day.  4. No new abnormalities.    Dg Chest Port 1 View 01/11/2017 (1 PM) IMPRESSION: No significant change since the prior exam. Persistent effusions, right more than left. Atelectasis at the right lung base. Aortic atherosclerosis.     Everrett Coombe, MD 01/13/2017, 1:21 PM PGY-1, [place] Intern pager: [phone], text pages welcome

## 2017-01-13 NOTE — Progress Notes (Signed)
Daily Progress Note   Patient Name: Carolyn Hendricks       Date: 01/13/2017 DOB: 03/19/1936  Age: 81 y.o. MRN#: [id number] Attending Physician: Dickie La, MD Primary Care Physician: [name], MD Admit Date: 01/06/2017  Reason for Consultation/Follow-up: Disposition and Establishing goals of care  Subjective: Carolyn Hendricks is feeling similar to yesterday in terms of her energy, but does feel she is breathing more comfortably. She has had lots of family visiting, which she feels helps her manage her anxiety over her deteriorating health. Her primary concerns at present are getting her affairs in order.  Length of Stay: 7  Current Medications: Scheduled Meds:  . aspirin EC  81 mg Oral Daily  . Chlorhexidine Gluconate Cloth  6 each Topical Q0600  . cholestyramine  4 g Oral Daily  . clopidogrel  75 mg Oral q morning - 10a  . docusate sodium  100 mg Oral BID  . ferrous sulfate  325 mg Oral Daily  . fluticasone furoate-vilanterol  1 puff Inhalation Daily  . furosemide  80 mg Intravenous BID  . heparin  5,000 Units Subcutaneous Q8H  . isosorbide mononitrate  30 mg Oral Daily  . levothyroxine  75 mcg Oral QAC breakfast  . metoprolol  50 mg Oral BID  . mupirocin ointment  1 application Nasal BID  . pantoprazole  40 mg Oral Daily  . sertraline  50 mg Oral QHS  . simvastatin  20 mg Oral q1800  . sodium bicarbonate  650 mg Oral TID  . sodium chloride flush  3 mL Intravenous Q12H    Continuous Infusions:   PRN Meds: sodium chloride, sodium chloride, acetaminophen **OR** acetaminophen, fentaNYL (SUBLIMAZE) injection, guaiFENesin-dextromethorphan, hydrALAZINE, HYDROcodone-acetaminophen, ondansetron **OR** ondansetron (ZOFRAN) IV, sodium chloride flush, technetium TC 47M diethylenetriame-pentaacetic acid  Physical Exam  Constitutional: She  is oriented to person, place, and time. She appears well-developed and well-nourished. No distress.  HENT:  Head: Normocephalic and atraumatic.  Mouth/Throat: Oropharynx is clear and moist. No oropharyngeal exudate.  Eyes: EOM are normal.  Neck: Normal range of motion. Neck supple.  Cardiovascular: Normal rate and regular rhythm.   Murmur heard. Pulmonary/Chest: Effort normal. She has decreased breath sounds in the right lower field and the left lower field. She has no wheezes. She has rhonchi.  Musculoskeletal: She exhibits edema (1+ BLE).  Neurological: She is alert and oriented to person, place, and time.  Skin: Skin is warm and dry. There is pallor.  Psychiatric: She has a normal mood and affect. Her speech is normal and behavior is normal. Judgment and thought content normal. Cognition and memory are normal.            Vital Signs: BP (!) 109/98   Pulse 68   Temp 98.5 F (36.9 C) (Oral)   Resp 16   Ht 5' (1.524 m)   Wt 87.5 kg (193 lb)   SpO2 99%   BMI 37.69 kg/m  SpO2: SpO2: 99 % O2 Device: O2 Device: Nasal Cannula O2 Flow Rate: O2 Flow Rate (L/min): 2 L/min  Intake/output summary:  Intake/Output Summary (Last 24 hours) at 01/13/17 1405 Last data filed at 01/13/17 1258  Gross per 24 hour  Intake  0 ml  Output             3900 ml  Net            -3900 ml   LBM: Last BM Date: 01/12/17 Baseline Weight: Weight: 91.6 kg (202 lb) Most recent weight: Weight: 87.5 kg (193 lb)  Palliative Assessment/Data: PPS 50%   Flowsheet Rows     Most Recent Value  Intake Tab  Referral Department  -- [family medicine]  Unit at Time of Referral  Med/Surg Unit  Palliative Care Primary Diagnosis  Cardiac  Date Notified  01/11/17  Palliative Care Type  New Palliative care  Reason for referral  Clarify Goals of Care  Date of Admission  01/06/17  Date first seen by Palliative Care  01/12/17  # of days Palliative referral response time  1 Day(s)  # of days IP prior to  Palliative referral  5  Clinical Assessment  Psychosocial & Spiritual Assessment  Palliative Care Outcomes      Patient Active Problem List   Diagnosis Date Noted  . Pulmonary hypertension due to COPD ([place]) 01/12/2017  . Acute respiratory failure with hypercapnia ([place])   . Altered mental state   . Pulmonary hypertension   . Chronic respiratory failure with hypercapnia ([place])   . Goals of care, counseling/discussion   . Palliative care encounter   . Chronic diarrhea of unknown origin   . Nausea and vomiting   . AKI (acute kidney injury) ([place])   . Diarrhea   . Moderate malnutrition ([place])   . Decreased oral intake 01/06/2017  . Hypoxia 07/01/2016  . Acute encephalopathy 07/01/2016  . Renal failure (ARF), acute on chronic (HCC) 07/01/2016  . Acute respiratory failure with hypoxia ([place]) 07/01/2016  . S/P laparoscopic cholecystectomy 12/01/2015  . Umbilical hernia without obstruction and without gangrene   . COPD (chronic obstructive pulmonary disease) ([place]) 10/22/2015  . Peripheral vascular disease ([place]) 10/15/2015  . Difficulty in walking 10/15/2015  . Airway hyperreactivity 10/15/2015  . Gallstone pancreatitis   . MRSA cellulitis 06/29/2015  . SOB (shortness of breath)   . LBBB (left bundle branch block)   . Angina pectoris ([place]) 05/14/2015  . Leg swelling 05/14/2015  . Allergic rhinitis 04/17/2015  . Anxiety 04/17/2015  . Arthralgia of hand 04/17/2015  . Chronic kidney disease (CKD), stage III (moderate) 04/17/2015  . Fatigue 04/17/2015  . Numbness and tingling 04/17/2015  . Nonspecific abnormal finding in stool contents 04/17/2015  . Restless leg 04/17/2015  . Hypothyroidism 07/09/2014  . Abnormal mammogram 11/01/2012  . Carotid stenosis 03/28/2012  . Atrial tachycardia ([place]) 03/28/2012  . Hyperlipidemia 03/28/2012  . Carotid artery obstruction 03/28/2012  . Supraventricular tachycardia ([place]) 03/28/2012  . Hyperpotassemia 02/27/2010  . Heart murmur 03/10/2009  .  Insomnia 03/05/2009  . Essential (primary) hypertension 01/29/2009  . Goiter 01/29/2009  . History of tobacco use 01/29/2009  . Spinal stenosis of lumbar region without neurogenic claudication 07/14/2003    Palliative Care Assessment & Plan   HPI: 81 y.o. female  with past medical history of CAD, asthma,HTN, hypothyroidism with goiter, CKDIII, DDD with spinal stenosis of lumbar region, CHF, HLD, anxiety, acute renal failure, obesity and history of tobacco use admitted on 01/06/2017 with decreased po intake and abdominal pain. Found to have right heart failure with severe pulmonary hypertension.   Assessment: Palliative Vinie Sill NP) first met with Carolyn Hendricks and her daughter Hilda Hendricks on 4/5. In that meeting they discussed her health issues, and they had  a good understanding of her poor prognosis and limited time. Elmo Putt also discussed HCPOA paperwork, code status, and possibly utilization of Hospice with Hilda Hendricks, as Ms. Basham was visiting with family at that point.   I followed up today to have another conversation with Carolyn Hendricks about her end of life wishes. Unfortunately, I had just missed Debra. Carolyn Hendricks was very clear that she did not want any resuscitative measures if she experienced cardiac or respiratory failure. She feels strongly that she should be allowed to die without intervention and as peacefully as possible. I explained that translated to a DNR order, which meant no CPR, defibrillation, or intubation, which she fully agreed with. In terms of HCPOA, she believes the paperwork has already been filled out and a copy is at home. She wants Hilda Hendricks to be her Media planner. Finally, we talked about different options for support after the hospitalization. She wanted to pursue SNF for short-term rehab, then plans on going home. I did suggest considering Hospice once she left the SNF. Carolyn Hendricks is very familiar with Hospice, but wanted to think on it a bit. She understands that she would need to  request it after her rehab stay.   Recommendations/Plan:  DNR  Plan to d/c to SNF for short term rehab once medically ready, SW already aware  Message left on Debra's cell phone with my contact information as I want to update her on my conversation with her mother and confirm HCPOA paperwork has been completed  Goals of Care and Additional Recommendations:  Limitations on Scope of Treatment: Full Scope Treatment  Code Status:  DNR  Prognosis:   < 6 months secondary to RV heart failure and pulmonary hypertension.  Discharge Planning:  SNF for short term rehab  Care plan was discussed with pt.   Thank you for allowing the Palliative Medicine Team to assist in the care of this patient.  Total time: 35 minutes    Greater than 50%  of this time was spent counseling and coordinating care related to the above assessment and plan.  Charlynn Court, NP Palliative Medicine Team [phone] pager (7a-5p) Team Phone # [phone]

## 2017-01-13 NOTE — Discharge Summary (Signed)
[facility] Discharge Summary  Patient name: Carolyn Hendricks Medical record number: [id number] Date of birth: [birth year] Age: 81 y.o. Gender: female Date of Admission: 01/06/2017  Date of Discharge: 01/17/2017 Admitting Physician: Dickie La, MD  Primary Care Provider: [name], MD Consultants: Cardiology  Indication for Hospitalization: Diarrhea  Discharge Diagnoses/Problem List:  Patient Active Problem List   Diagnosis Date Noted  . Acute on chronic diastolic CHF (congestive heart failure), NYHA class 4 ([place])   . RVF (right ventricular failure) ([place])   . Pulmonary hypertension due to COPD ([place]) 01/12/2017  . Acute respiratory failure with hypercapnia ([place])   . Altered mental state   . Pulmonary hypertension   . Chronic respiratory failure with hypercapnia ([place])   . Goals of care, counseling/discussion   . Palliative care encounter   . Chronic diarrhea of unknown origin   . Nausea and vomiting   . AKI (acute kidney injury) ([place])   . Diarrhea   . Moderate malnutrition ([place])   . Decreased oral intake 01/06/2017  . Hypoxia 07/01/2016  . Acute encephalopathy 07/01/2016  . Renal failure (ARF), acute on chronic (HCC) 07/01/2016  . Acute respiratory failure with hypoxia ([place]) 07/01/2016  . S/P laparoscopic cholecystectomy 12/01/2015  . Umbilical hernia without obstruction and without gangrene   . COPD (chronic obstructive pulmonary disease) ([place]) 10/22/2015  . Peripheral vascular disease ([place]) 10/15/2015  . Difficulty in walking 10/15/2015  . Airway hyperreactivity 10/15/2015  . Gallstone pancreatitis   . MRSA cellulitis 06/29/2015  . SOB (shortness of breath)   . LBBB (left bundle branch block)   . Angina pectoris ([place]) 05/14/2015  . Leg swelling 05/14/2015  . Allergic rhinitis 04/17/2015  . Anxiety 04/17/2015  . Arthralgia of hand 04/17/2015  . Chronic kidney disease (CKD), stage III (moderate) 04/17/2015  . Fatigue 04/17/2015  .  Numbness and tingling 04/17/2015  . Nonspecific abnormal finding in stool contents 04/17/2015  . Restless leg 04/17/2015  . Hypothyroidism 07/09/2014  . Abnormal mammogram 11/01/2012  . Carotid stenosis 03/28/2012  . Atrial tachycardia ([place]) 03/28/2012  . Hyperlipidemia 03/28/2012  . Carotid artery obstruction 03/28/2012  . Supraventricular tachycardia ([place]) 03/28/2012  . Hyperpotassemia 02/27/2010  . Heart murmur 03/10/2009  . Insomnia 03/05/2009  . Essential (primary) hypertension 01/29/2009  . Goiter 01/29/2009  . History of tobacco use 01/29/2009  . Spinal stenosis of lumbar region without neurogenic claudication 07/14/2003     Disposition: SNF  Discharge Condition: Stable/appropriate for palliative  Discharge Exam:  General: NAD, rests comfortably, pleasant and interactive Cardiovascular: RRR, no m/r/g Respiratory: +diminished breath sounds on the left, +mild crackles diffusely, +mild rhonchi Abdomen: soft and nontender, nondistended Extremities: 2+ pitting edema and chronic bil shin skin changes  Pt Overview and Major Events to Date:  3/30 - Admitted to FPTS for inability to tolerate PO 4/4 - R heart cath Severe mixed pulmonary HTN (WHO Group II & III) with evidence of RV failure/low output and volume overload, Rapid response also called for dec responsiveness, pt placed on Bipap and transferred to step down, improved mentation throughout the day  Brief Hospital Course:  Carolyn Hendricks a 81 y.o.femalepresenting with decreased po intake and abdominal pain. PMH is significant for CAD, asthma, HTN, hypothyroidism with goiter, CKDIII, DDD with spinal stenosis of lumbar region, CHF, HLD, anxiety, acute renal failure, obesity and history of tobacco use. Has had cholecystectomy and appendectomy.   Severe Right Heart Failure [facility], 3)  There was concern for RH failure  with elevated liver enzymes, contraction alkalosis, dyspnea and LE edema. Cardiac echo was ordered and  demonstrated evidence of severe right heart failure. VQ scan was low probability for PE. Cardiology attempted cardiac cath, which was significant  For WHO II, III severe right heart failure. Recommendation was for palliative care. Lasix were continued through the IV until patient was euvolemic, and then transitioned to PO. Cardiology recommendations at discharge included continuing Lasix 60 mg BID at discharge. No further cardiology interventions, and cardiology pointed out with this severe right heart failure, palliative is recommended.  Patient was seen by palliative  Care team. Goals of care discussion was completed, patient elected to transition to DNR status. She considered hospice care after SNF but continues to consider this and has not made a decision yet.  HCPOA was elected Hilda Blades, her daughter).  Acute on Chronic Respiratory failure and Rapid Response On 4/4, patient was noted to have altered mentation with decreased responsiveness. Rapid response was called, pt placed on Bipap and transferred to step down. Workup for somnolence was consistent with hypercapnia. Bipap was removed later that day and mentation improved. She was alert and oriented, mentation at her baseline at the time of discharge. BreoEllipta, Duonebs, home O2 by 2L were given throughout hospital stay.  Rapid Response Labs 4/4 Troponins 0.06 0.165/53/74.8  Diarrhea Uncertain etiology, workup was negative.  Initial concern for ACS (troponin near baseline without EKG change), pancreatitis (lipase normal at 27), Pneumonia (small R pleural effusion and possible RLL PNA), infectious etiology (GI panel negative, C diff panel negative). No leukocytosis or fevers. On further history patient endorsed having a cholecystectomy in December of 2016, and has noted diarrhea since that time. Patient was started on cholestyramine for suspected post-cholecystectomy diarrhea. Diarrhea gradually improved throughout hospitalization and was resolved  at the time of discharge.   Anemia, acute on chronic Patient was noted to have low Hgb at 8.2 which improved and was stable at the time of discharge. Labs were consistent with iron deficiency anemia. FeSO4 supplementation 325 mg QD started 4/3. Patient remained stable and asymptomatic from anemia.  AKI on CKD  Likely in the setting of dehydration and poor PO for several days prior to admission. Baseline creatinine around 0.9.  Creatinine gradually improved from 3.49 to ~baseline at the time of discharge.    Issues for Follow Up:  1. Patient discharted with 60 mg PO Lasix BID.  2. Recheck BMP 1 week post discharge and evaluate fluid status 3. Discharged with 40 mg Potassium daily, please assess whether this dose needs to be adjusted at discharge.  Significant Procedures: Right Heart Catheterization  Significant Labs and Imaging:  Dg Chest Port 1 View 01/06/2017 FINDINGS: There is airspace consolidation with volume loss the right lower lobe. There is a right pleural effusion. Left lung is clear. Heart is enlarged with pulmonary vascularity within normal limits. No adenopathy. There is atherosclerotic calcification in the aorta. There is degenerative change in the shoulders.  IMPRESSION: Airspace consolidation with volume loss in the right lower lobe. Suspect pneumonia. There is cardiomegaly with small right pleural effusion. No frank edema. Left lung clear. There is aortic atherosclerosis.   Ct Abdomen Pelvis W Contrast 12/11/2016 IMPRESSION:  1. Nonspecific presacral stranding, with question of underlying soft tissue inflammation about the rectum. Would correlate clinically for any evidence of proctitis.  2. Mildly nodular contour of the liver raises concern for mild hepatic cirrhosis. Trace ascites noted about the liver.  3. Small right and trace left pleural effusions.  Right basilar atelectasis noted.  4. Mild cardiomegaly.  5. Focal scarring at the midportion of the right kidney.  6.  Scattered diverticulosis along the sigmoid colon, without evidence of diverticulitis.  7. Ectatic abdominal aorta at risk for aneurysm development. Recommend followup by ultrasound in 5 years. This recommendation follows ACR consensus guidelines: White Paper of the ACR Incidental Findings Committee II on Vascular Findings. J Am Coll Radiol 2013; 10:789-794. 8. Likely mild luminal narrowing along the common iliac arteries bilaterally, secondary to calcific atherosclerotic disease. Scattered aortic atherosclerosis.  9. Mild right convex thoracolumbar scoliosis. Partial chronic osseous fusion at L1-L2.  10. Multiple calcified uterine fibroids noted.8   Cardiac Echo Study Conclusions - Procedure narrative: Transthoracic echocardiography. Image quality was adequate. The study was technically difficult, as a result of body habitus. - Left ventricle: The cavity size was normal. Wall thickness was increased in a pattern of mild LVH. Systolic function was normal. The estimated ejection fraction was in the range of 60% to 65%. Wall motion was normal; there were no regional wall motion abnormalities. Doppler parameters are consistent with abnormal left ventricular relaxation (grade 1 diastolic dysfunction). The Ee&' ratio is between 8-15, suggesting indeterminate LV filling pressure. - Ventricular septum: Septal motion showed paradox. The contour showed diastolic flattening and systolic flattening. This suggests pressure and volume overload of the RV. - Mitral valve: Calcified annulus. Mildly thickened leaflets . There was trivial regurgitation. - Left atrium: The atrium was normal in size. - Right ventricle: The cavity size was moderately dilated. Mildly reduced systolic function. - Right atrium: Severely dilated. - Tricuspid valve: There was moderate regurgitation. - Pulmonary arteries: PA peak pressure: 75 mm Hg (S). - Inferior vena cava: The vessel was dilated. The  respirophasic diameter changes were blunted (<50%), consistent with elevated central venous pressure.  Impressions: - Technically difficult study. LVEF 60-65%, mild LVH, normal wall motion, grade 1 DD with indeterminate LV filling pressure, moderate RAE with mildly reduced RV systolic function, ventricular septal flattening (Septal-D sign) and paradoxical motion consistent with pressure and volume overload of the RV, severe RAE, moderate TR, RVSP 75 mmHg, dilated IVC (but no clear flow reversal in the hepatic vein), no pericardial effusion.  Nm Pulmonary Perf And Vent 01/11/2017 IMPRESSION: Matching ventilation and perfusion defects in the posterior segment right upper lobe. No other appreciable perfusion defect. Relative ventilation defect in the right base in an area of infiltrate seen on chest radiograph. Cardiomegaly noted. Based on PIOPED II criteria, this study constitutes a low probability of pulmonary embolus. Electronically Signed   By: Lowella Grip III M.D.   On: 01/11/2017 09:05   Dg Chest Port 1 View 01/11/2017 FINDINGS: Cardiac silhouette markedly enlarged, unchanged. Thoracic aorta atherosclerotic, unchanged. Hilar and mediastinal contours otherwise unremarkable. Interval resolution of the interstitial pulmonary edema since the examination earlier today. Persistent small bilateral pleural effusions, right greater than left. Mild passive atelectasis in the right lower lobe with improved aeration since earlier in the day. No new abnormalities.  IMPRESSION:  1. Resolution of interstitial pulmonary edema.  2. Persistent small bilateral pleural effusions, right greater than left.  3. Mild passive atelectasis in the right lower lobe with improved aeration since earlier in the day.  4. No new abnormalities.    Dg Chest Port 1 View 01/11/2017 (1 PM) IMPRESSION: No significant change since the prior exam. Persistent effusions, right more than left. Atelectasis at the  right lung base. Aortic atherosclerosis.     Recent Labs Lab 01/13/17 0201 01/14/17  0304 01/17/17 0424  WBC 5.9 4.9 4.1  HGB 9.9* 9.2* 9.4*  HCT 36.8 35.8* 36.1  PLT 147* 151 143*    Recent Labs Lab 01/11/17 1149 01/12/17 0230 01/12/17 0909 01/12/17 1530 01/13/17 0201 01/14/17 0304 01/16/17 0454 01/16/17 0922 01/17/17 0424 01/17/17 0944  NA 145 143  --   --  142 140 140  --  141  --   K 5.2* 3.7  --   --  3.4* 3.5 3.5  --  5.0  --   CL 108 95*  --   --  86* 82* 88*  --  91*  --   CO2 29 38*  --   --  45* 46* 41*  --  41*  --   GLUCOSE 98 84  --   --  89 81 90  --  89  --   BUN 16 12  --   --  _0 --  19  --   CREATININE 1.07* 1.02*  --   --  1.08* 1.06* 1.20*  --  1.17*  --   CALCIUM 8.6* 8.9  --   --  8.9 9.0 9.4  --  9.5  --   MG  --  1.5*  --  1.9 1.7 1.7  --  1.9  --  1.9  PHOS  --   --  2.6  --   --   --   --   --   --   --   ALKPHOS 67  --   --   --   --   --   --   --   --   --   AST 32  --   --   --   --   --   --   --   --   --   ALT 25  --   --   --   --   --   --   --   --   --   ALBUMIN 3.1*  --   --   --   --   --   --   --   --   --       Results/Tests Pending at Time of Discharge: None  Discharge Medications:  Allergies as of 01/17/2017      Reactions   Atorvastatin Other (See Comments)   Other reaction(s): Muscle Pain   Crestor [rosuvastatin Calcium] Other (See Comments)   Other reaction(s): Muscle Pain   Tape Other (See Comments)   Makes a bruise on my skin. Paper tape is ok to use.   Pravastatin Sodium Other (See Comments)   Muscle pain      Medication List    STOP taking these medications   amLODipine 10 MG tablet Commonly known as:  NORVASC   meloxicam 15 MG tablet Commonly known as:  MOBIC     TAKE these medications   ADVAIR DISKUS 250-50 MCG/DOSE Aepb Generic drug:  Fluticasone-Salmeterol INHALE ONE (1) PUFF EVERY 12 HOURS AS DIRECTED. RINSE MOUTH AFTER EACH USE.   albuterol 108 (90 Base) MCG/ACT inhaler Commonly  known as:  PROVENTIL HFA;VENTOLIN HFA Inhale 1 puff into the lungs every 6 (six) hours as needed for wheezing or shortness of breath.   albuterol (2.5 MG/3ML) 0.083% nebulizer solution Commonly known as:  PROVENTIL Take 3 mLs (2.5 mg total) by nebulization every 4 (four) hours as needed for wheezing or shortness of breath.   aspirin  81 MG tablet Take 81 mg by mouth every morning. Reported on 01/07/2016   cholestyramine 4 g packet Commonly known as:  QUESTRAN Take 1 packet (4 g total) by mouth daily.   clopidogrel 75 MG tablet Commonly known as:  PLAVIX TAKE ONE TABLET BY MOUTH EVERY MORNING   ferrous sulfate 325 (65 FE) MG tablet Take 1 tablet (325 mg total) by mouth daily. Start taking on:  01/18/2017   fluticasone 50 MCG/ACT nasal spray Commonly known as:  FLONASE INHALE ONE TO TWO SPRAY IN EACH NOSTRIL DAILY AS NEEDED   furosemide 20 MG tablet Commonly known as:  LASIX Take 3 tablets (60 mg total) by mouth 2 (two) times daily.   HYDROcodone-acetaminophen 10-325 MG tablet Commonly known as:  NORCO Take 1 tablet by mouth every 4 (four) hours as needed for moderate pain or severe pain.   ipratropium-albuterol 0.5-2.5 (3) MG/3ML Soln Commonly known as:  DUONEB Take 3 mLs by nebulization 3 (three) times daily.   isosorbide mononitrate 30 MG 24 hr tablet Commonly known as:  IMDUR TAKE 1 TABLET BY MOUTH DAILY FOR HEART   levothyroxine 75 MCG tablet Commonly known as:  SYNTHROID, LEVOTHROID TAKE 1 TABLET BY MOUTH DAILY (FOR THYROID) What changed:  See the new instructions.   LORazepam 0.5 MG tablet Commonly known as:  ATIVAN Take 0.5 tablets (0.25 mg total) by mouth every 12 (twelve) hours as needed for anxiety.   metoprolol 50 MG tablet Commonly known as:  LOPRESSOR Take 1 tablet (50 mg total) by mouth 2 (two) times daily. What changed:  medication strength  how much to take   mupirocin ointment 2 % Commonly known as:  BACTROBAN APPLY TO AFFECTED AREA(S) TWO  TIMES PER DAY AS NEEDED   nitroGLYCERIN 0.4 MG SL tablet Commonly known as:  NITROSTAT Place 1 tablet (0.4 mg total) under the tongue every 5 (five) minutes as needed for chest pain.   ondansetron 4 MG disintegrating tablet Commonly known as:  ZOFRAN ODT Take 1 tablet (4 mg total) by mouth every 8 (eight) hours as needed for nausea or vomiting.   pantoprazole 40 MG tablet Commonly known as:  PROTONIX Take 1 tablet (40 mg total) by mouth daily.   potassium chloride SA 20 MEQ tablet Commonly known as:  K-DUR,KLOR-CON Take 2 tablets (40 mEq total) by mouth daily. Start taking on:  01/18/2017   sertraline 50 MG tablet Commonly known as:  ZOLOFT Take 25 mg by mouth daily for mood What changed:  how much to take  how to take this  when to take this  additional instructions   simvastatin 20 MG tablet Commonly known as:  ZOCOR Take 20 mg by mouth daily   sodium bicarbonate 650 MG tablet Take 650 mg by mouth 3 (three) times daily.   traZODone 100 MG tablet Commonly known as:  DESYREL TAKE ONE-HALF OR ONE TABLET BY MOUTH AT BEDTIME AS NEEDED FOR SLEEP.   Vitamin D-3 5000 units Tabs Take 1 tablet by mouth daily.            Durable Medical Equipment        Start     Ordered   01/09/17 [phone]  For home use only DME Bedside commode  Once    Comments:  Needs Lumex brand per PT  Question:  Patient needs a bedside commode to treat with the following condition  Answer:  Generalized weakness   01/09/17 0837   01/09/17 0837  For home use only  DME wheelchair cushion (seat and back)  Once     01/09/17 0836   01/09/17 0835  For home use only DME lightweight manual wheelchair with seat cushion  Once    Comments:  Patient suffers from Decreased activity tolerance;Decreased balance;Decreased mobility;Decreased knowledge of use of DME;Decreased safety awareness;Decreased knowledge of precautions;Pain which impairs their ability to perform daily activities like in the home.  A cane  will not resolve  issue with performing activities of daily living. A wheelchair will allow patient to safely perform daily activities. Patient is not able to propel themselves in the home using a standard weight wheelchair due to general weakness. Patient can self propel in the lightweight wheelchair.  Accessories: elevating leg rests (ELRs), wheel locks, extensions and anti-tippers.   01/09/17 0835      Discharge Instructions: Please refer to Patient Instructions section of EMR for full details.  Patient was counseled important signs and symptoms that should prompt return to medical care, changes in medications, dietary instructions, activity restrictions, and follow up appointments.   Follow-Up Appointments:   Everrett Coombe, MD 01/17/2017, 4:06 PM PGY-1, [place]

## 2017-01-13 NOTE — Progress Notes (Signed)
Progress Note  Patient Name: Carolyn Hendricks Date of Encounter: 01/13/2017  Primary Cardiologist: Dr. Rockey Situ Dr. Fletcher Anon (PAD)  Subjective   Up in chair- breathing comfortably  Inpatient Medications    Scheduled Meds: . aspirin EC  81 mg Oral Daily  . Chlorhexidine Gluconate Cloth  6 each Topical Q0600  . cholestyramine  4 g Oral Daily  . clopidogrel  75 mg Oral q morning - 10a  . docusate sodium  100 mg Oral BID  . ferrous sulfate  325 mg Oral Daily  . fluticasone furoate-vilanterol  1 puff Inhalation Daily  . furosemide  80 mg Intravenous BID  . heparin  5,000 Units Subcutaneous Q8H  . isosorbide mononitrate  30 mg Oral Daily  . levothyroxine  75 mcg Oral QAC breakfast  . metoprolol  50 mg Oral BID  . mupirocin ointment  1 application Nasal BID  . pantoprazole  40 mg Oral Daily  . sertraline  50 mg Oral QHS  . simvastatin  20 mg Oral q1800  . sodium bicarbonate  650 mg Oral TID  . sodium chloride flush  3 mL Intravenous Q12H   Continuous Infusions:  PRN Meds: sodium chloride, sodium chloride, acetaminophen **OR** acetaminophen, fentaNYL (SUBLIMAZE) injection, guaiFENesin-dextromethorphan, hydrALAZINE, HYDROcodone-acetaminophen, ondansetron **OR** ondansetron (ZOFRAN) IV, sodium chloride flush, technetium TC 37M diethylenetriame-pentaacetic acid   Vital Signs    Vitals:   01/13/17 0013 01/13/17 0540 01/13/17 0800 01/13/17 0830  BP: 115/69 (!) 151/85 (!) 166/75   Pulse: 77 74 76   Resp: (!) 22 20 (!) 25   Temp: 99.1 F (37.3 C) 98.5 F (36.9 C) 97.4 F (36.3 C)   TempSrc: Oral Oral Oral   SpO2:  100% 99% 100%  Weight:  193 lb (87.5 kg)    Height:        Intake/Output Summary (Last 24 hours) at 01/13/17 0941 Last data filed at 01/13/17 0548  Gross per 24 hour  Intake              120 ml  Output             5100 ml  Net            -4980 ml   Filed Weights   01/10/17 0500 01/12/17 0405 01/13/17 0540  Weight: 222 lb 11.2 oz (101 kg) 201 lb (91.2 kg) 193 lb  (87.5 kg)    Telemetry    NSR - Personally Reviewed  ECG    SR LBBB - Personally Reviewed  Physical Exam   GEN: Elderly, obese, moderately ill appearing, on O2, NAD.   Neck: + JVD Cardiac: RRR,ectopy, 2/6 systolic murmur AOV,no rubs, or gallops.  Respiratory:decreased breath sounds, faint expiratory wheezing GI: Soft, nontender, non-distended  MS: No edema; No deformity. No edema Neuro:  Nonfocal  Psych: Normal affect   Labs    Chemistry Recent Labs Lab 01/08/17 0333 01/09/17 0323  01/11/17 1149 01/12/17 0230 01/13/17 0201  NA 142 144  < > 145 143 142  K 4.3 4.0  < > 5.2* 3.7 3.4*  CL 103 105  < > 108 95* 86*  CO2 33* 31  < > 29 38* 45*  GLUCOSE 140* 104*  < > 98 84 89  BUN 35* 28*  < > _0 CREATININE 1.86* 1.59*  < > 1.07* 1.02* 1.08*  CALCIUM 8.6* 8.5*  < > 8.6* 8.9 8.9  PROT 5.6* 5.7*  --  4.7*  --   --  ALBUMIN 3.5 3.5  --  3.1*  --   --   AST 51* 40  --  32  --   --   ALT 48 39  --  25  --   --   ALKPHOS 117 101  --  67  --   --   BILITOT 0.9 0.9  --  1.0  --   --   GFRNONAA 25* 30*  < > 48* 51* 48*  GFRAA 29* 35*  < > 56* 59* 55*  ANIONGAP 6 8  < > _0 < > = values in this interval not displayed.   Hematology  Recent Labs Lab 01/11/17 0417 01/12/17 0230 01/13/17 0201  WBC 5.7 5.6 5.9  RBC 4.15 4.19 4.65  HGB 8.6* 9.0* 9.9*  HCT 33.3* 33.4* 36.8  MCV 80.2 79.7 79.1  MCH 20.7* 21.5* 21.3*  MCHC 25.8* 26.9* 26.9*  RDW 19.5* 20.2* 20.7*  PLT 138* 118* 147*    Cardiac Enzymes  Recent Labs Lab 01/06/17 1658 01/06/17 2219 01/07/17 0440 01/11/17 1403  TROPONINI 0.12* 0.10* 0.09* 0.06*   No results for input(s): TROPIPOC in the last 168 hours.   BNP  Recent Labs Lab 01/06/17 1658  BNP >4,500.0*     DDimer No results for input(s): DDIMER in the last 168 hours.   Radiology    Dg Chest Port 1 View  Result Date: 01/11/2017 CLINICAL DATA:  81 year old with acute onset shortness of breath. Follow-up CHF, effusions and  right basilar atelectasis. EXAM: PORTABLE CHEST 1 VIEW 1706 hours: COMPARISON:  Portable chest x-ray earlier today 1138 hours, chest x-rays yesterday, 01/06/2017 and earlier. FINDINGS: Cardiac silhouette markedly enlarged, unchanged. Thoracic aorta atherosclerotic, unchanged. Hilar and mediastinal contours otherwise unremarkable. Interval resolution of the interstitial pulmonary edema since the examination earlier today. Persistent small bilateral pleural effusions, right greater than left. Mild passive atelectasis in the right lower lobe with improved aeration since earlier in the day. No new abnormalities. IMPRESSION: 1. Resolution of interstitial pulmonary edema. 2. Persistent small bilateral pleural effusions, right greater than left. 3. Mild passive atelectasis in the right lower lobe with improved aeration since earlier in the day. 4. No new abnormalities. Electronically Signed   By: Evangeline Dakin M.D.   On: 01/11/2017 17:24   Dg Chest Port 1 View  Result Date: 01/11/2017 CLINICAL DATA:  Sudden onset of shortness of breath this morning. Altered mental status. EXAM: PORTABLE CHEST 1 VIEW COMPARISON:  01/10/2017 and 01/06/2017 FINDINGS: There is chronic cardiomegaly. Pulmonary vascularity is within normal limits. Moderate right pleural effusion. Atelectasis at the right lung base. The left lung is clear except for a small left effusion. IMPRESSION: No significant change since the prior exam. Persistent effusions, right more than left. Atelectasis at the right lung base. Aortic atherosclerosis. Electronically Signed   By: Lorriane Shire M.D.   On: 01/11/2017 11:48    Cardiac Studies   RHC: Findings:  RA = 21 RV = 78/26 PA = 81/36 (51) PCW = 29 Fick cardiac output/index = 3.8/1.96 PVR = 5.8 Ao sat = 98% PA sat = 39%, 43% RA/PCWP = 0.72 PAPi = 2.14  Assessment: 1. Severe mixed pulmonary HTN (WHO Group II & III) with evidence of RV failure/low output and volume  overload  Plan/Discussion:  Based on hemodynamics overall prognosis is poor. Would proceed with IV diuresis. Will need to optimize respiratory status with CPAP/BiPAP as tolerated. Due to somnolence serum ammonia drawn in lab. Could consider IV milrinone  as last resort therapy for RV support but she seems to be poor candidate for home inotropes. Consider Palliative Care involvement.   Glori Bickers, MD   Patient Profile     81 y.o. female with severe mixed pulmonary hypertension with a history of HTN, tobacco abuse, atrial tachycardia, chronic anemia,carotid artery disease s/p L CEA, PAD, COPD on 2L home 02, LBBB, non obst CAD, CKD, chronic diastolic CHFand chronic LE edema who presented to [facility] on 01/06/17 with abdominal pain, N/V, decreased PO intake.  Assessment & Plan    Right heart failure with severe pulmonary hypertension  - Both from left heart and lung processes.   - With PCW of 29, keep diuresis as tolerated with IV lasix. Out 6.1L total  - Poor prognosis. Palliation.   AKI  - improved  COPD  - O2. Chronic CO2 retainer (hypercapnic)  PVD  - stable  Demand ischemia  - Cath 2015 50% LAD otherwise normal.   Plan- Continue IV diuretics until her SCr bumps or B/P drops. MD to see.  Signed, Kerin Ransom, PA-C  01/13/2017, 9:41 AM    Personally seen and examined. Agree with above. Feeling better. Less SOB. Less nausea.  RV failure with pulm HTN WHO 2,3. IV lasix. Continue as above.  Palliative care team note reviewed.  Candee Furbish, MD

## 2017-01-14 DIAGNOSIS — I50810 Right heart failure, unspecified: Secondary | ICD-10-CM

## 2017-01-14 DIAGNOSIS — I509 Heart failure, unspecified: Secondary | ICD-10-CM

## 2017-01-14 DIAGNOSIS — I5033 Acute on chronic diastolic (congestive) heart failure: Secondary | ICD-10-CM | POA: Diagnosis present

## 2017-01-14 LAB — BASIC METABOLIC PANEL
Anion gap: 12 (ref 5–15)
BUN: 12 mg/dL (ref 6–20)
CHLORIDE: 82 mmol/L — AB (ref 101–111)
CO2: 46 mmol/L — ABNORMAL HIGH (ref 22–32)
CREATININE: 1.06 mg/dL — AB (ref 0.44–1.00)
Calcium: 9 mg/dL (ref 8.9–10.3)
GFR calc Af Amer: 56 mL/min — ABNORMAL LOW (ref >60)
GFR, EST NON AFRICAN AMERICAN: 49 mL/min — AB (ref >60)
GLUCOSE: 81 mg/dL (ref 65–99)
POTASSIUM: 3.5 mmol/L (ref 3.5–5.1)
Sodium: 140 mmol/L (ref 135–145)

## 2017-01-14 LAB — CBC
HCT: 35.8 % — ABNORMAL LOW (ref 36.0–46.0)
Hemoglobin: 9.2 g/dL — ABNORMAL LOW (ref 12.0–15.0)
MCH: 20.7 pg — ABNORMAL LOW (ref 26.0–34.0)
MCHC: 25.7 g/dL — AB (ref 30.0–36.0)
MCV: 80.4 fL (ref 78.0–100.0)
PLATELETS: 151 10*3/uL (ref 150–400)
RBC: 4.45 MIL/uL (ref 3.87–5.11)
RDW: 21.1 % — ABNORMAL HIGH (ref 11.5–15.5)
WBC: 4.9 10*3/uL (ref 4.0–10.5)

## 2017-01-14 LAB — MAGNESIUM: Magnesium: 1.7 mg/dL (ref 1.7–2.4)

## 2017-01-14 MED ORDER — MAGNESIUM SULFATE IN D5W 1-5 GM/100ML-% IV SOLN
1.0000 g | Freq: Once | INTRAVENOUS | Status: AC
  Administered 2017-01-14: 1 g via INTRAVENOUS
  Filled 2017-01-14: qty 100

## 2017-01-14 MED ORDER — FUROSEMIDE 10 MG/ML IJ SOLN
40.0000 mg | Freq: Two times a day (BID) | INTRAMUSCULAR | Status: DC
  Administered 2017-01-14 – 2017-01-15 (×2): 40 mg via INTRAVENOUS
  Filled 2017-01-14 (×2): qty 4

## 2017-01-14 MED ORDER — POTASSIUM CHLORIDE CRYS ER 20 MEQ PO TBCR
40.0000 meq | EXTENDED_RELEASE_TABLET | Freq: Two times a day (BID) | ORAL | Status: AC
  Administered 2017-01-14 (×2): 40 meq via ORAL
  Filled 2017-01-14 (×2): qty 2

## 2017-01-14 NOTE — Progress Notes (Signed)
Family Medicine Teaching Service Daily Progress Note Intern Pager: [phone]  Patient name: Carolyn Hendricks Medical record number: [id number] Date of birth: [birth year] Age: 81 y.o. Gender: female  Primary Care Provider: [name], MD Consultants: Cardiology, Palliative Care Code Status: Full  Pt Overview and Major Events to Date:  3/30 - Admitted to FPTS for inability to tolerate PO 4/4 - R heart cath Severe mixed pulmonary HTN (WHO Group II & III) with evidence of RV failure/low output and volume overload, Rapid response also called for dec responsiveness, pt placed on Bipap and transferred to step down, improved mentation throughout the day  Assessment and Plan: ELVA MAURO a 81 y.o.femalepresenting with decreased po intake and abdominal pain. PMH is significant for CAD, asthma, HTN, hypothyroidism with goiter, CKDIII, DDD with spinal stenosis of lumbar region, CHF, HLD, anxiety, acute renal failure, obesity and history of tobacco use. Has had cholecystectomy and appendectomy.   CHF, right heart failure, worsened: - cardiology consult appreciate recs -  - palliative consult - plan to d/c to snf for short term rehab once medically ready  Acute on Chronic Respiratory Failure, improved - At home on Advair diskus. Not on formulary but ordered as Breo Ellipta.  - Continue Breo Ellipta 1 puff daily - Duonebs PRN - continue home O2 by [place] (2L)   HTN, stable- At home on Imdur 30 mg daily, Lopressor 100 mg BID, and Norvasc 10 mg daily .  - Continue home Imdur and Lopressor.  - Hold Norvasc for now in setting of leg swelling  - Continue Lasix 80 mg IV BID - Hydral PRN high pressures - Monitor blood pressuress  Abdominal pain/nausea/vomiting/decreased po intake, improved: Uncertain etiology. Post-cholecystectomy diarrhea considered (lap chole 11/2015) - started cholestyramine (4g packet Qd) and symptoms resolved -Tylenol 650 mg Q6 prn for pain  - Zofran 4 mg Q6 prn for  nausea   CAD, stable: Troponins trended down 0.12 > 0.09> 0.06 - continue home aspirin, Zocor 20 mg and Plavix   Anemia acute on chronic, stable -  FeSO4 supplementation 325 mg QD started 4/3 - transfusion threshold 8 - AM CBC  Acute on chronic Renal Failure, resolved:SCr 1.06 from 3.49 on admission. Likely dehydration from decreased PO over last several days. Baseline Cr ~0.9. With CKD III.  - AM BMET  -Continue home sodium bicarb 650 mg daily   Weakness, generalized:likely related to poor PO intake although degenerative disc disease is probably contributing to history of falls. Has had recent fall but did not hit head. No LOC.  - PT/OT : SNF (CSW consult placed)  Anxiety/Depression:At home on Zoloft. Per daughter, used to take Ativan but no longer is on it.  -Continue Zoloft 50 mg po at bedtime - can consider stopping based on mentation  Insomnia. At home on Trazodone 100 mg prn at bedtime.  - Holding home trazodone given decreased mentation, can restart when more alert  Hypothyroidism, stable.Last TSH 0.639 and wnl, on 07/01/2016. At home on Synthroid.  -Continue home Synthroid 75 mcg   DDD with spinal stenosis of lumbar region, stable. Complaining of back pain on admission but this is not new. Denies acute symptoms. At home on Norco 10-325 Q4 PRN.  -Continue home pain med  FEN/GI:  Goal mag>2, K>4.   PPx: heparin  Disposition: SNF, pending placement  Subjective:  Reports doing better this morning. Reports breathing is better. Does have some abdominal pain but this has also improved and she is tolerating a diet. She has  not had diarrhea.   Objective: Temp:  [98.5 F (36.9 C)-98.9 F (37.2 C)] 98.5 F (36.9 C) (04/07 0355) Pulse Rate:  [64-85] 64 (04/07 0800) Resp:  [8-23] 8 (04/07 0800) BP: (109-140)/(51-98) 109/77 (04/07 0800) SpO2:  [98 %-100 %] 100 % (04/07 0800) Weight:  [82.8 kg (182 lb 9.6 oz)] 82.8 kg (182 lb 9.6 oz) (04/07 0355) Physical  Exam: General: NAD, rests comfortably, pleasant and interactive Cardiovascular: RRR, no m/r/g Respiratory: +diminished breath sounds on the right, much more clear  Abdomen: soft and mildly tender to palpation, nondistended Extremities: trace edema and chronic bil shin skin changes  I/O last 3 completed shifts: In: 480 [P.O.:480] Out: 6975 [Urine:6975] No intake/output data recorded.   Filed Weights   01/12/17 0405 01/13/17 0540 01/14/17 0355  Weight: 91.2 kg (201 lb) 87.5 kg (193 lb) 82.8 kg (182 lb 9.6 oz)    Laboratory:  Recent Labs Lab 01/12/17 0230 01/13/17 0201 01/14/17 0304  WBC 5.6 5.9 4.9  HGB 9.0* 9.9* 9.2*  HCT 33.4* 36.8 35.8*  PLT 118* 147* 151    Recent Labs Lab 01/08/17 0333 01/09/17 0323  01/11/17 1149 01/12/17 0230 01/13/17 0201 01/14/17 0304  NA 142 144  < > 145 143 142 140  K 4.3 4.0  < > 5.2* 3.7 3.4* 3.5  CL 103 105  < > 108 95* 86* 82*  CO2 33* 31  < > 29 38* 45* 46*  BUN 35* 28*  < > _0 CREATININE 1.86* 1.59*  < > 1.07* 1.02* 1.08* 1.06*  CALCIUM 8.6* 8.5*  < > 8.6* 8.9 8.9 9.0  PROT 5.6* 5.7*  --  4.7*  --   --   --   BILITOT 0.9 0.9  --  1.0  --   --   --   ALKPHOS 117 101  --  67  --   --   --   ALT 48 39  --  25  --   --   --   AST 51* 40  --  32  --   --   --   GLUCOSE 140* 104*  < > 98 84 89 81  < > = values in this interval not displayed.  Hepatitis panel negative. HIV nonreactive. FOBT negative C diff negative Ferritin 27, Fe 11, TIBC 428  Rapid Response Labs 4/4 Troponins 0.12>0.10>0.09>0.06 7.343/60/31.7 LA 1.99>1.29>1.4  Imaging/Diagnostic Tests: Dg Chest Port 1 View 01/06/2017 FINDINGS: There is airspace consolidation with volume loss the right lower lobe. There is a right pleural effusion. Left lung is clear. Heart is enlarged with pulmonary vascularity within normal limits. No adenopathy. There is atherosclerotic calcification in the aorta. There is degenerative change in the shoulders.  IMPRESSION:  Airspace consolidation with volume loss in the right lower lobe. Suspect pneumonia. There is cardiomegaly with small right pleural effusion. No frank edema. Left lung clear. There is aortic atherosclerosis.   Ct Abdomen Pelvis W Contrast 12/11/2016  IMPRESSION:  1. Nonspecific presacral stranding, with question of underlying soft tissue inflammation about the rectum. Would correlate clinically for any evidence of proctitis.  2. Mildly nodular contour of the liver raises concern for mild hepatic cirrhosis. Trace ascites noted about the liver.  3. Small right and trace left pleural effusions. Right basilar atelectasis noted.  4. Mild cardiomegaly.  5. Focal scarring at the midportion of the right kidney.  6. Scattered diverticulosis along the sigmoid colon, without evidence of diverticulitis.  7. Ectatic abdominal aorta  at risk for aneurysm development. Recommend followup by ultrasound in 5 years. This recommendation follows ACR consensus guidelines: White Paper of the ACR Incidental Findings Committee II on Vascular Findings. J Am Coll Radiol 2013; 10:789-794. 8. Likely mild luminal narrowing along the common iliac arteries bilaterally, secondary to calcific atherosclerotic disease. Scattered aortic atherosclerosis.  9. Mild right convex thoracolumbar scoliosis. Partial chronic osseous fusion at L1-L2.  10. Multiple calcified uterine fibroids noted.8   Cardiac Echo Study Conclusions - Procedure narrative: Transthoracic echocardiography. Image quality was adequate. The study was technically difficult, as a result of body habitus. - Left ventricle: The cavity size was normal. Wall thickness was increased in a pattern of mild LVH. Systolic function was normal. The estimated ejection fraction was in the range of 60% to 65%. Wall motion was normal; there were no regional wall motion abnormalities. Doppler parameters are consistent with abnormal left ventricular relaxation (grade 1  diastolic dysfunction). The Ee&' ratio is between 8-15, suggesting indeterminate LV filling pressure. - Ventricular septum: Septal motion showed paradox. The contour showed diastolic flattening and systolic flattening. This suggests pressure and volume overload of the RV. - Mitral valve: Calcified annulus. Mildly thickened leaflets . There was trivial regurgitation. - Left atrium: The atrium was normal in size. - Right ventricle: The cavity size was moderately dilated. Mildly reduced systolic function. - Right atrium: Severely dilated. - Tricuspid valve: There was moderate regurgitation. - Pulmonary arteries: PA peak pressure: 75 mm Hg (S). - Inferior vena cava: The vessel was dilated. The respirophasic diameter changes were blunted (<50%), consistent with elevated central venous pressure.  Impressions: - Technically difficult study. LVEF 60-65%, mild LVH, normal wall motion, grade 1 DD with indeterminate LV filling pressure, moderate RAE with mildly reduced RV systolic function, ventricular septal flattening (Septal-D sign) and paradoxical motion consistent with pressure and volume overload of the RV, severe RAE, moderate TR, RVSP 75 mmHg, dilated IVC (but no clear flow reversal in the hepatic vein), no pericardial effusion.  Nm Pulmonary Perf And Vent 01/11/2017 IMPRESSION: Matching ventilation and perfusion defects in the posterior segment right upper lobe. No other appreciable perfusion defect. Relative ventilation defect in the right base in an area of infiltrate seen on chest radiograph. Cardiomegaly noted. Based on PIOPED II criteria, this study constitutes a low probability of pulmonary embolus. Electronically Signed   By: Lowella Grip III M.D.   On: 01/11/2017 09:05   Dg Chest Port 1 View 01/11/2017 FINDINGS: Cardiac silhouette markedly enlarged, unchanged. Thoracic aorta atherosclerotic, unchanged. Hilar and mediastinal contours otherwise  unremarkable. Interval resolution of the interstitial pulmonary edema since the examination earlier today. Persistent small bilateral pleural effusions, right greater than left. Mild passive atelectasis in the right lower lobe with improved aeration since earlier in the day. No new abnormalities.  IMPRESSION:  1. Resolution of interstitial pulmonary edema.  2. Persistent small bilateral pleural effusions, right greater than left.  3. Mild passive atelectasis in the right lower lobe with improved aeration since earlier in the day.  4. No new abnormalities.    Dg Chest Port 1 View 01/11/2017 (1 PM) IMPRESSION: No significant change since the prior exam. Persistent effusions, right more than left. Atelectasis at the right lung base. Aortic atherosclerosis.     Smiley Houseman, MD 01/14/2017, 9:17 AM PGY-2, [place] Intern pager: [phone], text pages welcome

## 2017-01-14 NOTE — Progress Notes (Signed)
Progress Note  Patient Name: Carolyn Hendricks Date of Encounter: 01/14/2017  Primary Cardiologist: Dr. Rockey Situ Dr. Fletcher Anon (PAD)  Subjective   Feeling better, improved SOB.  Inpatient Medications    Scheduled Meds: . aspirin EC  81 mg Oral Daily  . Chlorhexidine Gluconate Cloth  6 each Topical Q0600  . cholestyramine  4 g Oral Daily  . clopidogrel  75 mg Oral q morning - 10a  . docusate sodium  100 mg Oral BID  . ferrous sulfate  325 mg Oral Daily  . fluticasone furoate-vilanterol  1 puff Inhalation Daily  . furosemide  80 mg Intravenous BID  . heparin  5,000 Units Subcutaneous Q8H  . isosorbide mononitrate  30 mg Oral Daily  . levothyroxine  75 mcg Oral QAC breakfast  . magnesium sulfate 1 - 4 g bolus IVPB  1 g Intravenous Once  . metoprolol  50 mg Oral BID  . mupirocin ointment  1 application Nasal BID  . pantoprazole  40 mg Oral Daily  . potassium chloride  40 mEq Oral BID  . sertraline  50 mg Oral QHS  . simvastatin  20 mg Oral q1800  . sodium bicarbonate  650 mg Oral TID  . sodium chloride flush  3 mL Intravenous Q12H   Continuous Infusions:  PRN Meds: sodium chloride, sodium chloride, acetaminophen **OR** acetaminophen, fentaNYL (SUBLIMAZE) injection, guaiFENesin-dextromethorphan, hydrALAZINE, HYDROcodone-acetaminophen, ondansetron **OR** ondansetron (ZOFRAN) IV, sodium chloride flush, technetium TC 6M diethylenetriame-pentaacetic acid, traZODone   Vital Signs    Vitals:   01/14/17 0355 01/14/17 0400 01/14/17 0800 01/14/17 0921  BP: 130/60 134/66 109/77   Pulse: 64 65 64 74  Resp: 16 18 (!) 8 19  Temp: 98.5 F (36.9 C)   98.9 F (37.2 C)  TempSrc: Oral   Oral  SpO2: 100% 100% 100% 99%  Weight: 182 lb 9.6 oz (82.8 kg)     Height:        Intake/Output Summary (Last 24 hours) at 01/14/17 1157 Last data filed at 01/14/17 1000  Gross per 24 hour  Intake              720 ml  Output             6025 ml  Net            -5305 ml   Filed Weights   01/12/17  0405 01/13/17 0540 01/14/17 0355  Weight: 201 lb (91.2 kg) 193 lb (87.5 kg) 182 lb 9.6 oz (82.8 kg)    Telemetry    NSR - Personally Reviewed  ECG    SR, LBBB - Personally Reviewed  Physical Exam   GEN: Elderly, obese, moderately ill appearing, on O2, NAD.   Neck: + JVD Cardiac: RRR,ectopy, 2/6 systolic murmur AOV,no rubs, or gallops.  Respiratory:decreased breath sounds, faint expiratory wheezing GI: Soft, nontender, non-distended  MS: No edema; No deformity. No edema Neuro:  Nonfocal  Psych: Normal affect   Labs    Chemistry Recent Labs Lab 01/08/17 0333 01/09/17 0323  01/11/17 1149 01/12/17 0230 01/13/17 0201 01/14/17 0304  NA 142 144  < > 145 143 142 140  K 4.3 4.0  < > 5.2* 3.7 3.4* 3.5  CL 103 105  < > 108 95* 86* 82*  CO2 33* 31  < > 29 38* 45* 46*  GLUCOSE 140* 104*  < > 98 84 89 81  BUN 35* 28*  < > _0 CREATININE 1.86* 1.59*  < >  1.07* 1.02* 1.08* 1.06*  CALCIUM 8.6* 8.5*  < > 8.6* 8.9 8.9 9.0  PROT 5.6* 5.7*  --  4.7*  --   --   --   ALBUMIN 3.5 3.5  --  3.1*  --   --   --   AST 51* 40  --  32  --   --   --   ALT 48 39  --  25  --   --   --   ALKPHOS 117 101  --  67  --   --   --   BILITOT 0.9 0.9  --  1.0  --   --   --   GFRNONAA 25* 30*  < > 48* 51* 48* 49*  GFRAA 29* 35*  < > 56* 59* 55* 56*  ANIONGAP 6 8  < > _0 < > = values in this interval not displayed.   Hematology  Recent Labs Lab 01/12/17 0230 01/13/17 0201 01/14/17 0304  WBC 5.6 5.9 4.9  RBC 4.19 4.65 4.45  HGB 9.0* 9.9* 9.2*  HCT 33.4* 36.8 35.8*  MCV 79.7 79.1 80.4  MCH 21.5* 21.3* 20.7*  MCHC 26.9* 26.9* 25.7*  RDW 20.2* 20.7* 21.1*  PLT 118* 147* 151    Cardiac Enzymes  Recent Labs Lab 01/11/17 1403  TROPONINI 0.06*   No results for input(s): TROPIPOC in the last 168 hours.   BNP No results for input(s): BNP, PROBNP in the last 168 hours.   DDimer No results for input(s): DDIMER in the last 168 hours.   Radiology    No results  found.  Cardiac Studies   RHC: Findings:  RA = 21 RV = 78/26 PA = 81/36 (51) PCW = 29 Fick cardiac output/index = 3.8/1.96 PVR = 5.8 Ao sat = 98% PA sat = 39%, 43% RA/PCWP = 0.72 PAPi = 2.14  Assessment: 1. Severe mixed pulmonary HTN (WHO Group II & III) with evidence of RV failure/low output and volume overload  Plan/Discussion:  Based on hemodynamics overall prognosis is poor. Would proceed with IV diuresis. Will need to optimize respiratory status with CPAP/BiPAP as tolerated. Due to somnolence serum ammonia drawn in lab. Could consider IV milrinone as last resort therapy for RV support but she seems to be poor candidate for home inotropes. Consider Palliative Care involvement.   Glori Bickers, MD   Patient Profile     81 y.o. female with severe mixed pulmonary hypertension with a history of HTN, tobacco abuse, atrial tachycardia, chronic anemia,carotid artery disease s/p L CEA, PAD, COPD on 2L home 02, LBBB, non obst CAD, CKD, chronic diastolic CHFand chronic LE edema who presented to [facility] on [facility]06[facility]17 with abdominal pain, N/V, decreased PO intake.  Assessment & Plan    Right heart failure with severe pulmonary hypertension  - RV failure with pulm HTN   - With PCW of 29, keep diuresis as tolerated with IV lasix, decrease to 40 mg iv BID. Out 11.2 L total, Crea stable, K 3.5 - weight 202--> 182, weight earlier his year 187 lbs  - Poor prognosis. Palliative care follow, DNR now.  AKI  - improved  COPD  - O2. Chronic CO2 retainer (hypercapnic)  PVD  - stable  Demand ischemia  - Cath 2015 50% LAD otherwise normal. Troponin minimally elevated, sec to CHF.  Plan- Continue IV diuretics until her SCr bumps or B/P drops.   Signed, Ena Dawley, MD  01/14/2017, 11:57 AM

## 2017-01-14 NOTE — Progress Notes (Signed)
Daily Progress Note   Patient Name: Carolyn Hendricks       Date: 01/14/2017 DOB: [birth year]  Age: 81 y.o. MRN#: [id number] Attending Physician: Dickie La, MD Primary Care Physician: [name], MD Admit Date: 01/06/2017  Reason for Consultation/Follow-up: Disposition and Establishing goals of care  Subjective: Carolyn Hendricks slept very well overnight and was just waking up when I visited this morning. She relates feeling a bit groggy, but much better rested compared to prior days. She was about to work on breakfast and take her medication.  Length of Stay: 8  Current Medications: Scheduled Meds:  . aspirin EC  81 mg Oral Daily  . Chlorhexidine Gluconate Cloth  6 each Topical Q0600  . cholestyramine  4 g Oral Daily  . clopidogrel  75 mg Oral q morning - 10a  . docusate sodium  100 mg Oral BID  . ferrous sulfate  325 mg Oral Daily  . fluticasone furoate-vilanterol  1 puff Inhalation Daily  . furosemide  80 mg Intravenous BID  . heparin  5,000 Units Subcutaneous Q8H  . isosorbide mononitrate  30 mg Oral Daily  . levothyroxine  75 mcg Oral QAC breakfast  . metoprolol  50 mg Oral BID  . mupirocin ointment  1 application Nasal BID  . pantoprazole  40 mg Oral Daily  . sertraline  50 mg Oral QHS  . simvastatin  20 mg Oral q1800  . sodium bicarbonate  650 mg Oral TID  . sodium chloride flush  3 mL Intravenous Q12H    Continuous Infusions:   PRN Meds: sodium chloride, sodium chloride, acetaminophen **OR** acetaminophen, fentaNYL (SUBLIMAZE) injection, guaiFENesin-dextromethorphan, hydrALAZINE, HYDROcodone-acetaminophen, ondansetron **OR** ondansetron (ZOFRAN) IV, sodium chloride flush, technetium TC 59M diethylenetriame-pentaacetic acid, traZODone  Physical Exam  Constitutional: She is oriented to person, place, and time. She appears  well-developed and well-nourished. No distress.  HENT:  Head: Normocephalic and atraumatic.  Mouth/Throat: Oropharynx is clear and moist. No oropharyngeal exudate.  Eyes: EOM are normal.  Neck: Normal range of motion.  Cardiovascular: Normal rate and regular rhythm.   Murmur heard. Pulmonary/Chest: Effort normal. No respiratory distress. She has decreased breath sounds in the right lower field and the left lower field. She has rhonchi.  Musculoskeletal: She exhibits edema (1+ BLE).  Neurological: She is alert and oriented to person, place, and time.  Skin: Skin is warm and dry. There is pallor.  Psychiatric: She has a normal mood and affect. Her speech is normal and behavior is normal. Judgment and thought content normal. Cognition and memory are normal.            Vital Signs: BP 109/77   Pulse 64   Temp 98.5 F (36.9 C) (Oral)   Resp (!) 8   Ht 5' (1.524 m)   Wt 82.8 kg (182 lb 9.6 oz)   SpO2 100%   BMI 35.66 kg/m  SpO2: SpO2: 100 % O2 Device: O2 Device: Nasal Cannula O2 Flow Rate: O2 Flow Rate (L/min): 2 L/min  Intake/output summary:   Intake/Output Summary (Last 24 hours) at 01/14/17 0854 Last data filed at 01/13/17 2328  Gross per 24 hour  Intake  480 ml  Output             4775 ml  Net            -4295 ml   LBM: Last BM Date: 01/12/17 Baseline Weight: Weight: 91.6 kg (202 lb) Most recent weight: Weight: 82.8 kg (182 lb 9.6 oz)  Palliative Assessment/Data: PPS 50%   Flowsheet Rows     Most Recent Value  Intake Tab  Referral Department  -- [family medicine]  Unit at Time of Referral  Med/Surg Unit  Palliative Care Primary Diagnosis  Cardiac  Date Notified  01/11/17  Palliative Care Type  New Palliative care  Reason for referral  Clarify Goals of Care  Date of Admission  01/06/17  Date first seen by Palliative Care  01/12/17  # of days Palliative referral response time  1 Day(s)  # of days IP prior to Palliative referral  5  Clinical  Assessment  Psychosocial & Spiritual Assessment  Palliative Care Outcomes      Patient Active Problem List   Diagnosis Date Noted  . Pulmonary hypertension due to COPD ([place]) 01/12/2017  . Acute respiratory failure with hypercapnia ([place])   . Altered mental state   . Pulmonary hypertension   . Chronic respiratory failure with hypercapnia ([place])   . Goals of care, counseling/discussion   . Palliative care encounter   . Chronic diarrhea of unknown origin   . Nausea and vomiting   . AKI (acute kidney injury) ([place])   . Diarrhea   . Moderate malnutrition ([place])   . Decreased oral intake 01/06/2017  . Hypoxia 07/01/2016  . Acute encephalopathy 07/01/2016  . Renal failure (ARF), acute on chronic (HCC) 07/01/2016  . Acute respiratory failure with hypoxia ([place]) 07/01/2016  . S/P laparoscopic cholecystectomy 12/01/2015  . Umbilical hernia without obstruction and without gangrene   . COPD (chronic obstructive pulmonary disease) ([place]) 10/22/2015  . Peripheral vascular disease ([place]) 10/15/2015  . Difficulty in walking 10/15/2015  . Airway hyperreactivity 10/15/2015  . Gallstone pancreatitis   . MRSA cellulitis 06/29/2015  . SOB (shortness of breath)   . LBBB (left bundle branch block)   . Angina pectoris ([place]) 05/14/2015  . Leg swelling 05/14/2015  . Allergic rhinitis 04/17/2015  . Anxiety 04/17/2015  . Arthralgia of hand 04/17/2015  . Chronic kidney disease (CKD), stage III (moderate) 04/17/2015  . Fatigue 04/17/2015  . Numbness and tingling 04/17/2015  . Nonspecific abnormal finding in stool contents 04/17/2015  . Restless leg 04/17/2015  . Hypothyroidism 07/09/2014  . Abnormal mammogram 11/01/2012  . Carotid stenosis 03/28/2012  . Atrial tachycardia ([place]) 03/28/2012  . Hyperlipidemia 03/28/2012  . Carotid artery obstruction 03/28/2012  . Supraventricular tachycardia ([place]) 03/28/2012  . Hyperpotassemia 02/27/2010  . Heart murmur 03/10/2009  . Insomnia 03/05/2009  . Essential  (primary) hypertension 01/29/2009  . Goiter 01/29/2009  . History of tobacco use 01/29/2009  . Spinal stenosis of lumbar region without neurogenic claudication 07/14/2003    Palliative Care Assessment & Plan   HPI: 81 y.o. female  with past medical history of CAD, asthma,HTN, hypothyroidism with goiter, CKDIII, DDD with spinal stenosis of lumbar region, CHF, HLD, anxiety, acute renal failure, obesity and history of tobacco use admitted on 01/06/2017 with decreased po intake and abdominal pain. Found to have right heart failure with severe pulmonary hypertension.   Assessment: Palliative Vinie Sill NP) first met with Carolyn Hendricks and her daughter Hilda Blades on 4/5. In that meeting they discussed her health issues, and  they had a good understanding of her poor prognosis and limited time. Elmo Putt also discussed HCPOA paperwork, code status, and possibly utilization of Hospice with Hilda Blades, as Carolyn Hendricks was visiting with family at that point.   On 4/6 I followed-up with Carolyn Hendricks to have another conversation about her end of life wishes. In that conversation she was clear that she did not want resuscitative measures (DNR ordered). We also talked about care trajectory. Carolyn Hendricks recognized she will likely need Hospice support in the future, but wanted to try short-term rehab with the goal of improving her strength and having more independence when she got home. She plan to see how she feels once rehab ends, and will consider if Hospice would be beneficial at that point.   Today, I called Carolyn Hendricks's daughter, Hilda Blades, to update her on my conversation with her mother. I shared the DNR wishes, which Hilda Blades was very aware of and supported. I also discussed the plan for short-term rehab, and then consideration of Hospice based on how she is doing. Hilda Blades felt this was a good plan and was clear that she supported her mother's decisions and would continue to support her in whatever path she chose. Finally, I did ask about  HCPOA paperwork. Hilda Blades has the paperwork and is filling it out. I reinforced that she should ask the nurse to call the chaplain once the paperwork was completed so it could be notarized.   Recommendations/Plan:  DNR  Plan to d/c to SNF for short term rehab once medically ready, SW already aware  Goals of Care and Additional Recommendations:  Limitations on Scope of Treatment: Full Scope Treatment  Code Status:  DNR  Prognosis:   < 6 months secondary to RV heart failure and pulmonary hypertension.  Discharge Planning:  SNF for short term rehab  Care plan was discussed with pt, daughter, and care nurse.   Thank you for allowing the Palliative Medicine Team to assist in the care of this patient.  Total time: 25 minutes    Greater than 50%  of this time was spent counseling and coordinating care related to the above assessment and plan.  Charlynn Court, NP Palliative Medicine Team [phone] pager (7a-5p) Team Phone # [phone]

## 2017-01-15 MED ORDER — FUROSEMIDE 40 MG PO TABS
60.0000 mg | ORAL_TABLET | Freq: Two times a day (BID) | ORAL | Status: DC
  Administered 2017-01-15 – 2017-01-17 (×5): 60 mg via ORAL
  Filled 2017-01-15 (×4): qty 1

## 2017-01-15 NOTE — Progress Notes (Signed)
Patient refused CPAP for the night. Patient stated she just wears oxygen at night not CPAP

## 2017-01-15 NOTE — Progress Notes (Signed)
Family Medicine Teaching Service Daily Progress Note Intern Pager: [phone]  Patient name: Carolyn Hendricks Medical record number: [id number] Date of birth: 26-Mar-1936 Age: 81 y.o. Gender: female  Primary Care Provider: [name], MD Consultants: Cardiology, Palliative Care Code Status: Full  Pt Overview and Major Events to Date:  3/30 - Admitted to FPTS for inability to tolerate PO 4/4 - R heart cath Severe mixed pulmonary HTN (WHO Group II & III) with evidence of RV failure/low output and volume overload, Rapid response also called for dec responsiveness, pt placed on Bipap and transferred to step down, improved mentation throughout the day 4/7 DNR status by palliative care  Assessment and Plan: Carolyn Hendricks a 81 y.o.femalepresenting with decreased po intake and abdominal pain. PMH is significant for CAD, asthma, HTN, hypothyroidism with goiter, CKDIII, DDD with spinal stenosis of lumbar region, CHF, HLD, anxiety, acute renal failure, obesity and history of tobacco use. Has had cholecystectomy and appendectomy.   CHF, right heart failure, worsened:Severe pulmonary hypertension with poor prognosis. At home on Imdur 30 mg daily, Lopressor 100 mg BID. Weight 177# (down 25lbs from admit, 202#). Out 3.7 L in last 24hrs, net - 12.9L - Continue home Imdur and Lopressor.  - Cardiology following appreciate recs - diuresis as tolerated with IV lasix 84m BID. Will ask regarding if transitioning to po since approaching readiness for SNF - palliative consult - DNR, HCPOA in process, patient will consider Hospice as outpatient, plan to d/c to SNF for short term rehab once medically ready  Acute on Chronic Respiratory Failure, improved. At home on Advair diskus. Not on formulary but ordered as Breo Ellipta.  - Continue Breo Ellipta 1 puff daily - Duonebs PRN - continue home O2 by [place] (2L)   HTN, stable- At home on Imdur 30 mg daily, Lopressor 100 mg BID, and Norvasc 10 mg daily .  -  Continue home Imdur and Lopressor.  - Continue Lasix 40 mg IV BID - Holding Norvasc in setting of leg swelling  - Monitor blood pressures in the setting of ongoing diuresis  CAD, stable: Troponins trended down 0.12 > 0.09> 0.06 - continue home aspirin, Zocor 20 mg and Plavix   Anemia acute on chronic, stable. At baseline Hgb ~9 - FeSO4 supplementation 325 mg QD started 4/3 - transfusion threshold 8  Weakness, generalized:likely related to poor PO intake although degenerative disc disease is probably contributing to history of falls. Has had recent fall but did not hit head. No LOC.  - PT/OT : SNF  - CSW consult placed, awaiting SNF placement  Anxiety/Depression:At home on Zoloft. Per daughter, used to take Ativan but no longer is on it.  -Continue Zoloft 50 mg po at bedtime  Insomnia. At home on Trazodone 100 mg prn at bedtime.  - Trazodone restarted on 4/6 at 545mqhs prn sleep  Hypothyroidism, stable.Last TSH 0.639 and wnl, on 07/01/2016. At home on Synthroid.  -Continue home Synthroid 75 mcg   DDD with spinal stenosis of lumbar region, stable. Denies acute symptoms. At home on Norco 10-325 Q4 PRN.  -Continue home pain med  Abdominal pain/nausea/vomiting/decreased po intake, resolved:likely due to post-cholecystectomy diarrhea considered (lap chole 11/2015) - symptoms resolved on cholestyramine (4g packet Qd) -Tylenol 650 mg Q6 prn for pain  - Zofran 4 mg Q6 prn for nausea   AKI on CKD III, resolved:SCr at baseline ~1. Peak SCr 3.49 on admission. Likely dehydration from decreased PO.  -Continue home sodium bicarb 650 mg daily  FEN/GI: heart healthy diet, protonix, colace PPx: heparin  Disposition: SNF, pending placement  Subjective:  States having some difficulty having BM this morning, feels a little discomfort with constipation but no abdominal pain. Last BM was yesterday. Denies diarrhea. No CP, SOB. States legs still feel  swollen.  Objective: Temp:  [98.5 F (36.9 C)-99.7 F (37.6 C)] 98.6 F (37 C) (04/08 0406) Pulse Rate:  [64-93] 81 (04/08 0500) Resp:  [8-19] 13 (04/08 0500) BP: (101-157)/(50-123) 131/69 (04/08 0406) SpO2:  [95 %-100 %] 100 % (04/08 0500) Weight:  [80.5 kg (177 lb 6.4 oz)] 80.5 kg (177 lb 6.4 oz) (04/08 0500) Physical Exam: General: NAD, rests comfortably, pleasant and interactive Cardiovascular: RRR, no m/r/g Respiratory: poor air movement throughout but clear to ausculation. No wheezes or crackles appreciated. Abdomen: soft and mildly tender to palpation, nondistended Extremities: trace edema and chronic bil shin skin changes  I/O last 3 completed shifts: In: 1500 [P.O.:1400; IV Piggyback:100] Out: 5975 [Urine:5975] No intake/output data recorded.   Filed Weights   01/13/17 0540 01/14/17 0355 01/15/17 0500  Weight: 87.5 kg (193 lb) 82.8 kg (182 lb 9.6 oz) 80.5 kg (177 lb 6.4 oz)    Laboratory:  Recent Labs Lab 01/12/17 0230 01/13/17 0201 01/14/17 0304  WBC 5.6 5.9 4.9  HGB 9.0* 9.9* 9.2*  HCT 33.4* 36.8 35.8*  PLT 118* 147* 151    Recent Labs Lab 01/09/17 0323  01/11/17 1149 01/12/17 0230 01/13/17 0201 01/14/17 0304  NA 144  < > 145 143 142 140  K 4.0  < > 5.2* 3.7 3.4* 3.5  CL 105  < > 108 95* 86* 82*  CO2 31  < > 29 38* 45* 46*  BUN 28*  < > _0 CREATININE 1.59*  < > 1.07* 1.02* 1.08* 1.06*  CALCIUM 8.5*  < > 8.6* 8.9 8.9 9.0  PROT 5.7*  --  4.7*  --   --   --   BILITOT 0.9  --  1.0  --   --   --   ALKPHOS 101  --  67  --   --   --   ALT 39  --  25  --   --   --   AST 40  --  32  --   --   --   GLUCOSE 104*  < > 98 84 89 81  < > = values in this interval not displayed.  Hepatitis panel negative. HIV nonreactive. FOBT negative C diff negative Ferritin 27, Fe 11, TIBC 428  Imaging/Diagnostic Tests:  Cardiac Echo Study Conclusions - Procedure narrative: Transthoracic echocardiography. Image quality was adequate. The study was  technically difficult, as a result of body habitus. - Left ventricle: The cavity size was normal. Wall thickness was increased in a pattern of mild LVH. Systolic function was normal. The estimated ejection fraction was in the range of 60% to 65%. Wall motion was normal; there were no regional wall motion abnormalities. Doppler parameters are consistent with abnormal left ventricular relaxation (grade 1 diastolic dysfunction). The Ee&' ratio is between 8-15, suggesting indeterminate LV filling pressure. - Ventricular septum: Septal motion showed paradox. The contour showed diastolic flattening and systolic flattening. This suggests pressure and volume overload of the RV. - Mitral valve: Calcified annulus. Mildly thickened leaflets . There was trivial regurgitation. - Left atrium: The atrium was normal in size. - Right ventricle: The cavity size was moderately dilated. Mildly reduced systolic function. - Right atrium: Severely  dilated. - Tricuspid valve: There was moderate regurgitation. - Pulmonary arteries: PA peak pressure: 75 mm Hg (S). - Inferior vena cava: The vessel was dilated. The respirophasic diameter changes were blunted (<50%), consistent with elevated central venous pressure.  Impressions: - Technically difficult study. LVEF 60-65%, mild LVH, normal wall motion, grade 1 DD with indeterminate LV filling pressure, moderate RAE with mildly reduced RV systolic function, ventricular septal flattening (Septal-D sign) and paradoxical motion consistent with pressure and volume overload of the RV, severe RAE, moderate TR, RVSP 75 mmHg, dilated IVC (but no clear flow reversal in the hepatic vein), no pericardial effusion.   Bufford Lope, DO 01/15/2017, 7:36 AM PGY-1, [facility] Intern pager: [phone], text pages welcome

## 2017-01-16 LAB — BASIC METABOLIC PANEL
Anion gap: 11 (ref 5–15)
BUN: 19 mg/dL (ref 6–20)
CALCIUM: 9.4 mg/dL (ref 8.9–10.3)
CO2: 41 mmol/L — AB (ref 22–32)
Chloride: 88 mmol/L — ABNORMAL LOW (ref 101–111)
Creatinine, Ser: 1.2 mg/dL — ABNORMAL HIGH (ref 0.44–1.00)
GFR calc Af Amer: 48 mL/min — ABNORMAL LOW (ref >60)
GFR calc non Af Amer: 42 mL/min — ABNORMAL LOW (ref >60)
GLUCOSE: 90 mg/dL (ref 65–99)
Potassium: 3.5 mmol/L (ref 3.5–5.1)
Sodium: 140 mmol/L (ref 135–145)

## 2017-01-16 LAB — MAGNESIUM: Magnesium: 1.9 mg/dL (ref 1.7–2.4)

## 2017-01-16 MED ORDER — POTASSIUM CHLORIDE CRYS ER 20 MEQ PO TBCR
40.0000 meq | EXTENDED_RELEASE_TABLET | Freq: Two times a day (BID) | ORAL | Status: AC
  Administered 2017-01-16 (×2): 40 meq via ORAL
  Filled 2017-01-16 (×2): qty 2

## 2017-01-16 MED ORDER — POTASSIUM CHLORIDE CRYS ER 20 MEQ PO TBCR
40.0000 meq | EXTENDED_RELEASE_TABLET | Freq: Every day | ORAL | Status: DC
  Administered 2017-01-17: 40 meq via ORAL
  Filled 2017-01-16: qty 2

## 2017-01-16 NOTE — Progress Notes (Signed)
Progress Note  Patient Name: Carolyn Hendricks Date of Encounter: 01/16/2017  Primary Cardiologist: Rockey Situ   Subjective   81 yo with severe pulmonary HTN  Has been seen by Dr. Haroldine Laws and has had a R heart cath Has severe mixed pulmonary HTN ( WHO group II and III)  Is now a DNR and has been seen by palliative care. The plan is for SNF today   Inpatient Medications    Scheduled Meds: . aspirin EC  81 mg Oral Daily  . cholestyramine  4 g Oral Daily  . clopidogrel  75 mg Oral q morning - 10a  . docusate sodium  100 mg Oral BID  . ferrous sulfate  325 mg Oral Daily  . fluticasone furoate-vilanterol  1 puff Inhalation Daily  . furosemide  60 mg Oral BID  . heparin  5,000 Units Subcutaneous Q8H  . isosorbide mononitrate  30 mg Oral Daily  . levothyroxine  75 mcg Oral QAC breakfast  . metoprolol  50 mg Oral BID  . mupirocin ointment  1 application Nasal BID  . pantoprazole  40 mg Oral Daily  . potassium chloride  40 mEq Oral BID  . sertraline  50 mg Oral QHS  . simvastatin  20 mg Oral q1800  . sodium bicarbonate  650 mg Oral TID  . sodium chloride flush  3 mL Intravenous Q12H   Continuous Infusions:  PRN Meds: sodium chloride, sodium chloride, acetaminophen **OR** acetaminophen, guaiFENesin-dextromethorphan, HYDROcodone-acetaminophen, ondansetron **OR** ondansetron (ZOFRAN) IV, sodium chloride flush, technetium TC 19M diethylenetriame-pentaacetic acid, traZODone   Vital Signs    Vitals:   01/15/17 1809 01/15/17 1957 01/15/17 2133 01/16/17 0509  BP: 119/64 (!) 102/59 (!) 96/59 (!) 127/56  Pulse: 75 83 72 81  Resp: (!) _0 Temp:  97.8 F (36.6 C) 98.5 F (36.9 C) 98.4 F (36.9 C)  TempSrc:  Oral Oral Oral  SpO2: 98% 98% 91% 98%  Weight:      Height:        Intake/Output Summary (Last 24 hours) at 01/16/17 0949 Last data filed at 01/16/17 0510  Gross per 24 hour  Intake              723 ml  Output             1700 ml  Net             -977 ml   Filed  Weights   01/13/17 0540 01/14/17 0355 01/15/17 0500  Weight: 193 lb (87.5 kg) 182 lb 9.6 oz (82.8 kg) 177 lb 6.4 oz (80.5 kg)    Telemetry     NSR - Personally Reviewed  ECG    NSR  - Personally Reviewed  Physical Exam   GEN: No acute distress.   Neck: difficult to assess JVD  Cardiac: RR, soft systolic murmur .  Respiratory: wheezing  Left > right  GI: Soft, nontender, non-distended  MS: No edema;   Neuro:  Nonfocal ,  Somewhat weak .  Psych: Normal affect   Labs    Chemistry Recent Labs Lab 01/11/17 1149  01/13/17 0201 01/14/17 0304 01/16/17 0454  NA 145  < > 142 140 140  K 5.2*  < > 3.4* 3.5 3.5  CL 108  < > 86* 82* 88*  CO2 29  < > 45* 46* 41*  GLUCOSE 98  < > 89 81 90  BUN 16  < > _1 CREATININE 1.07*  < >  1.08* 1.06* 1.20*  CALCIUM 8.6*  < > 8.9 9.0 9.4  PROT 4.7*  --   --   --   --   ALBUMIN 3.1*  --   --   --   --   AST 32  --   --   --   --   ALT 25  --   --   --   --   ALKPHOS 67  --   --   --   --   BILITOT 1.0  --   --   --   --   GFRNONAA 48*  < > 48* 49* 42*  GFRAA 56*  < > 55* 56* 48*  ANIONGAP 8  < > _0 < > = values in this interval not displayed.   Hematology Recent Labs Lab 01/12/17 0230 01/13/17 0201 01/14/17 0304  WBC 5.6 5.9 4.9  RBC 4.19 4.65 4.45  HGB 9.0* 9.9* 9.2*  HCT 33.4* 36.8 35.8*  MCV 79.7 79.1 80.4  MCH 21.5* 21.3* 20.7*  MCHC 26.9* 26.9* 25.7*  RDW 20.2* 20.7* 21.1*  PLT 118* 147* 151    Cardiac Enzymes Recent Labs Lab 01/11/17 1403  TROPONINI 0.06*   No results for input(s): TROPIPOC in the last 168 hours.   BNPNo results for input(s): BNP, PROBNP in the last 168 hours.   DDimer No results for input(s): DDIMER in the last 168 hours.   Radiology    No results found.  Cardiac Studies      Patient Profile     81 y.o. female with severe pulmonary HTN  Assessment & Plan    1. Pulmonary HTN :  Severe.   With associated R heart failure. On PO lasix and breathing better  Palliative  care following  Prognosis is poor.  The plan to for SNF.    2. COPD :   Is a CO2 retainer   Will sign off. Call for questions.   Signed, Mertie Moores, MD  01/16/2017, 9:49 AM

## 2017-01-16 NOTE — Progress Notes (Signed)
qPhysical Therapy Treatment Patient Details Name: Carolyn Hendricks MRN: [id number] DOB: 10/02/1936 Today's Date: 01/16/2017    History of Present Illness Carolyn Hendricks a 81 y.o.femalepresenting with decreased po intake and abdominal pain. PMH is significant for CAD, asthma, HTN, hypothyroidism with goiter, CKDIII, DDD with spinal stenosis of lumbar region, CHF, HLD, anxiety, acute renal failure, obesity and history of tobacco use. Has had cholecystectomy and appendectomy.     PT Comments    Pt making good progress towards her goals. Pt min guard for transfer sit<>stand from recliner to RW and minAx2 1 for safety and stability 1x for close chair follow. Pt limited by fatigue and onset of feeling faint. Pt requires skilled PT to progress her transfers and gait as well as to improver her LE strength and endurance to safely navigate her discharge environment.     Follow Up Recommendations  SNF;Supervision/Assistance - 24 hour     Equipment Recommendations  None recommended by PT;Other (comment) (defer to next venue)    Recommendations for Other Services       Precautions / Restrictions Precautions Precautions: Fall Restrictions Weight Bearing Restrictions: No    Mobility  Bed Mobility               General bed mobility comments: pt in recliner at entry   Transfers Overall transfer level: Needs assistance Equipment used: Rolling walker (2 wheeled) Transfers: Sit to/from Stand Sit to Stand: Min guard         General transfer comment: pt able to performed sit to stand from recliner x2  vc for hand placement in push off and descent,   Ambulation/Gait Ambulation/Gait assistance: Min assist;+2 safety/equipment Ambulation Distance (Feet): 35 Feet (1x20, 1x15) Assistive device: Rolling walker (2 wheeled) Gait Pattern/deviations: Step-through pattern;Decreased stride length;Shuffle;Trunk flexed Gait velocity: decreased Gait velocity interpretation: Below normal speed  for age/gender General Gait Details: min A for stability and safety, pt became tired and required seated rest break after 20 feet of ambulation pt able to walk 15 feet back to her room          Balance Overall balance assessment: Needs assistance;History of Falls Sitting-balance support: No upper extremity supported;Feet supported Sitting balance-Leahy Scale: Fair     Standing balance support: Bilateral upper extremity supported;During functional activity Standing balance-Leahy Scale: Poor Standing balance comment: pt reliant on bilateral UEs on RW                            Cognition Arousal/Alertness: Awake/alert Behavior During Therapy: [facility] for tasks assessed/performed;Anxious Overall Cognitive Status: Within Functional Limits for tasks assessed                                               Pertinent Vitals/Pain Pain Assessment: Faces Faces Pain Scale: Hurts a little bit  2 L O2 via nasal cannula throughout session. At rest SaO2 100% O2, after ambulation of 20 feet 98% although the pt felt SoB. Pt became faint feeling upon returning to her room. SaO2 100% O2, BP 104/74 after 3 min BP 92/68. Pt reported feeling better after using fan and wet cloth to cool down. Nursing notified.             PT Goals (current goals can now be found in the care plan section) Acute Rehab PT Goals Patient  Stated Goal: go to rehab PT Goal Formulation: With patient Time For Goal Achievement: 01/21/17 Potential to Achieve Goals: Good Progress towards PT goals: Progressing toward goals    Frequency    Min 2X/week      PT Plan Current plan remains appropriate    Co-evaluation PT/OT/SLP Co-Evaluation/Treatment: Yes           End of Session Equipment Utilized During Treatment: Gait belt;Oxygen Activity Tolerance: Patient limited by fatigue Patient left: in chair;with call bell/phone within reach;with family/visitor present Nurse Communication:  Mobility status PT Visit Diagnosis: Unsteadiness on feet (R26.81);Muscle weakness (generalized) (M62.81)     Time: 1332-1400 PT Time Calculation (min) (ACUTE ONLY): 28 min  Charges:  $Gait Training: 23-37 mins                    G Codes:       Ocean Schildt B. Beverely Risen PT, DPT Acute Rehabilitation  [phone] Pager [phone]     Elon Alas Fleet 01/16/2017, 3:29 PM

## 2017-01-16 NOTE — NC FL2 (Signed)
[place] LEVEL OF CARE SCREENING TOOL     IDENTIFICATION  Patient Name: Carolyn Hendricks Birthdate: 16-Aug-1936 Sex: female Admission Date (Current Location): 01/06/2017  [facility] and [place] Number:  Herbalist and Address:  [facility], [place] [address] [place], [place] [address]      Provider Number: [id number]  Attending Physician Name and Address:  Dickie La, MD  Relative Name and Phone Number:       Current Level of Care: Hospital Recommended Level of Care: [place] Prior Approval Number:    Date Approved/Denied:   PASRR Number: [id number]  Discharge Plan: SNF    Current Diagnoses: Patient Active Problem List   Diagnosis Date Noted  . Acute on chronic diastolic CHF (congestive heart failure), NYHA class 4 ([place])   . RVF (right ventricular failure) ([place])   . Pulmonary hypertension due to COPD ([place]) 01/12/2017  . Acute respiratory failure with hypercapnia ([place])   . Altered mental state   . Pulmonary hypertension   . Chronic respiratory failure with hypercapnia ([place])   . Goals of care, counseling/discussion   . Palliative care encounter   . Chronic diarrhea of unknown origin   . Nausea and vomiting   . AKI (acute kidney injury) ([place])   . Diarrhea   . Moderate malnutrition ([place])   . Decreased oral intake 01/06/2017  . Hypoxia 07/01/2016  . Acute encephalopathy 07/01/2016  . Renal failure (ARF), acute on chronic (HCC) 07/01/2016  . Acute respiratory failure with hypoxia ([place]) 07/01/2016  . S/P laparoscopic cholecystectomy 12/01/2015  . Umbilical hernia without obstruction and without gangrene   . COPD (chronic obstructive pulmonary disease) ([place]) 10/22/2015  . Peripheral vascular disease ([place]) 10/15/2015  . Difficulty in walking 10/15/2015  . Airway hyperreactivity 10/15/2015  . Gallstone pancreatitis   . MRSA cellulitis 06/29/2015  . SOB (shortness of breath)   . LBBB (left bundle branch  block)   . Angina pectoris ([place]) 05/14/2015  . Leg swelling 05/14/2015  . Allergic rhinitis 04/17/2015  . Anxiety 04/17/2015  . Arthralgia of hand 04/17/2015  . Chronic kidney disease (CKD), stage III (moderate) 04/17/2015  . Fatigue 04/17/2015  . Numbness and tingling 04/17/2015  . Nonspecific abnormal finding in stool contents 04/17/2015  . Restless leg 04/17/2015  . Hypothyroidism 07/09/2014  . Abnormal mammogram 11/01/2012  . Carotid stenosis 03/28/2012  . Atrial tachycardia ([place]) 03/28/2012  . Hyperlipidemia 03/28/2012  . Carotid artery obstruction 03/28/2012  . Supraventricular tachycardia ([place]) 03/28/2012  . Hyperpotassemia 02/27/2010  . Heart murmur 03/10/2009  . Insomnia 03/05/2009  . Essential (primary) hypertension 01/29/2009  . Goiter 01/29/2009  . History of tobacco use 01/29/2009  . Spinal stenosis of lumbar region without neurogenic claudication 07/14/2003    Orientation RESPIRATION BLADDER Height & Weight     Self, Time, Situation, Place  O2 (3L) Indwelling catheter Weight: 177 lb 6.4 oz (80.5 kg) Height:  5' (152.4 cm)  BEHAVIORAL SYMPTOMS/MOOD NEUROLOGICAL BOWEL NUTRITION STATUS      Continent Diet (Heart healthy; thin fluids)  AMBULATORY STATUS COMMUNICATION OF NEEDS Skin   Extensive Assist Verbally Normal                       Personal Care Assistance Level of Assistance  Bathing, Feeding, Dressing Bathing Assistance: Maximum assistance Feeding assistance: Limited assistance Dressing Assistance: Maximum assistance     Functional Limitations Info  Sight, Hearing, Speech Sight Info: Adequate Hearing Info:  Adequate Speech Info: Adequate    SPECIAL CARE FACTORS FREQUENCY  PT (By licensed PT), OT (By licensed OT)     PT Frequency: 2x OT Frequency: 2x            Contractures Contractures Info: Not present    Additional Factors Info  Code Status, Allergies, Isolation Precautions, Psychotropic Code Status Info: DNR Allergies Info:  Atorvastatin, Crestor Rosuvastatin Calcium, Tape, Pravastatin Sodium Psychotropic Info: sertraline (ZOLOFT)   Isolation Precautions Info: Contact precautions     Current Medications (01/16/2017):  This is the current hospital active medication list Current Facility-Administered Medications  Medication Dose Route Frequency Provider Last Rate Last Dose  . 0.9 %  sodium chloride infusion  250 mL Intravenous PRN Lovenia Kim, MD      . 0.9 %  sodium chloride infusion  250 mL Intravenous PRN Jolaine Artist, MD      . acetaminophen (TYLENOL) tablet 650 mg  650 mg Oral Q6H PRN Lovenia Kim, MD   650 mg at 01/13/17 0755   Or  . acetaminophen (TYLENOL) suppository 650 mg  650 mg Rectal Q6H PRN Lovenia Kim, MD      . aspirin EC tablet 81 mg  81 mg Oral Daily Lovenia Kim, MD   81 mg at 01/16/17 1009  . cholestyramine (QUESTRAN) packet 4 g  4 g Oral Daily Verner Mould, MD   4 g at 01/16/17 1728  . clopidogrel (PLAVIX) tablet 75 mg  75 mg Oral q morning - 10a Lovenia Kim, MD   75 mg at 01/16/17 1009  . docusate sodium (COLACE) capsule 100 mg  100 mg Oral BID Lovenia Kim, MD   100 mg at 01/16/17 1009  . ferrous sulfate tablet 325 mg  325 mg Oral Daily Everrett Coombe, MD   325 mg at 01/16/17 1009  . fluticasone furoate-vilanterol (BREO ELLIPTA) 200-25 MCG/INH 1 puff  1 puff Inhalation Daily Lovenia Kim, MD   1 puff at 01/16/17 0941  . furosemide (LASIX) tablet 60 mg  60 mg Oral BID Bufford Lope, DO   60 mg at 01/16/17 1728  . guaiFENesin-dextromethorphan (ROBITUSSIN DM) 100-10 MG/5ML syrup 5 mL  5 mL Oral Q4H PRN Dickie La, MD   5 mL at 01/16/17 1008  . heparin injection 5,000 Units  5,000 Units Subcutaneous Q8H Lovenia Kim, MD   5,000 Units at 01/16/17 1432  . HYDROcodone-acetaminophen (NORCO) 10-325 MG per tablet 1 tablet  1 tablet Oral Q4H PRN Einar Grad, RPH   1 tablet at 01/16/17 2446  . isosorbide mononitrate (IMDUR) 24 hr tablet 30 mg  30 mg Oral Daily Lovenia Kim, MD   30  mg at 01/16/17 1009  . levothyroxine (SYNTHROID, LEVOTHROID) tablet 75 mcg  75 mcg Oral QAC breakfast Lovenia Kim, MD   75 mcg at 01/16/17 0836  . metoprolol (LOPRESSOR) tablet 50 mg  50 mg Oral BID Carlyle Dolly, MD   50 mg at 01/16/17 1011  . mupirocin ointment (BACTROBAN) 2 % 1 application  1 application Nasal BID Dickie La, MD   1 application at 95/07/22 1012  . ondansetron (ZOFRAN) tablet 4 mg  4 mg Oral Q6H PRN Lovenia Kim, MD   4 mg at 01/07/17 1224   Or  . ondansetron (ZOFRAN) injection 4 mg  4 mg Intravenous Q6H PRN Lovenia Kim, MD   4 mg at 01/10/17 1541  . pantoprazole (PROTONIX) EC tablet 40 mg  40 mg Oral Daily Lovenia Kim,  MD   40 mg at 01/16/17 1008  . potassium chloride SA (K-DUR,KLOR-CON) CR tablet 40 mEq  40 mEq Oral BID Everrett Coombe, MD   40 mEq at 01/16/17 1009  . [START ON 01/17/2017] potassium chloride SA (K-DUR,KLOR-CON) CR tablet 40 mEq  40 mEq Oral Daily Bufford Lope, DO      . sertraline (ZOLOFT) tablet 50 mg  50 mg Oral QHS Lovenia Kim, MD   50 mg at 01/15/17 2213  . simvastatin (ZOCOR) tablet 20 mg  20 mg Oral q1800 Lovenia Kim, MD   20 mg at 01/16/17 1729  . sodium bicarbonate tablet 650 mg  650 mg Oral TID Lovenia Kim, MD   650 mg at 01/16/17 1728  . sodium chloride flush (NS) 0.9 % injection 3 mL  3 mL Intravenous Q12H Lovenia Kim, MD   3 mL at 01/16/17 1000  . sodium chloride flush (NS) 0.9 % injection 3 mL  3 mL Intravenous PRN Lovenia Kim, MD   3 mL at 01/14/17 0905  . technetium TC 65M diethylenetriame-pentaacetic acid (DTPA) injection 30 millicurie  30 millicurie Intravenous Once PRN Lorriane Shire, MD      . traZODone (DESYREL) tablet 50 mg  50 mg Oral QHS PRN Smiley Houseman, MD   50 mg at 01/15/17 2221     Discharge Medications: Please see discharge summary for a list of discharge medications.  Relevant Imaging Results:  Relevant Lab Results:   Additional Information SSN: [id number]  Truitt Merle, LCSW

## 2017-01-16 NOTE — Progress Notes (Signed)
Patient refuses CPAP for the night. RT will continue to monitor.  

## 2017-01-16 NOTE — Progress Notes (Signed)
Family Medicine Teaching Service Daily Progress Note Intern Pager: [phone]  Patient name: Carolyn Hendricks Medical record number: [id number] Date of birth: [birth year] Age: 81 y.o. Gender: female  Primary Care Provider: [name], MD Consultants: Cardiology, Palliative Care Code Status: Full  Pt Overview and Major Events to Date:  3/30 - Admitted to FPTS for inability to tolerate PO 4/4 - R heart cath Severe mixed pulmonary HTN (WHO Group II & III) with evidence of RV failure/low output and volume overload, Rapid response also called for dec responsiveness, pt placed on Bipap and transferred to step down, improved mentation throughout the day 4/7 DNR status by palliative care  Assessment and Plan: Carolyn Hendricks a 81 y.o.femalepresenting with decreased po intake and abdominal pain. PMH is significant for CAD, asthma, HTN, hypothyroidism with goiter, CKDIII, DDD with spinal stenosis of lumbar region, CHF, HLD, anxiety, acute renal failure, obesity and history of tobacco use. Has had cholecystectomy and appendectomy.   CHF, right heart failure, worsened:Severe pulmonary hypertension with poor prognosis. At home on Imdur 30 mg daily, Lopressor 100 mg BID.  - Continue home Imdur and Lopressor.  - Cardiology following appreciate recs - per cards over phone can do 60 mg PO Lasix in preparation for SNF - palliative consult, apprec recs  Acute on Chronic Respiratory Failure, improved. At home on Advair diskus. Not on formulary but ordered as Breo Ellipta.  - Continue Breo Ellipta 1 puff daily - Duonebs PRN - continue home O2 by [place] (2L)   HTN, stable- At home on Imdur 30 mg daily, Lopressor 100 mg BID, and Norvasc 10 mg daily .  - Continue home Imdur and Lopressor.  - Continue Lasix 40 mg IV BID - Holding Norvasc in setting of leg swelling  - Monitor blood pressures in the setting of ongoing diuresis  CAD, stable: Troponins trended down 0.12 > 0.09> 0.06 - continue home  aspirin, Zocor 20 mg and Plavix   Anemia acute on chronic, stable. At baseline Hgb ~9. Hgb stable today. - FeSO4 supplementation 325 mg QD started 4/3 - transfusion threshold 8  Weakness, generalized:likely related to poor PO intake although degenerative disc disease is probably contributing to history of falls. Has had recent fall but did not hit head. No LOC.  - PT/OT : SNF  - waiting placement  Anxiety/Depression:At home on Zoloft. Per daughter, used to take Ativan but no longer is on it.  -Continue Zoloft 50 mg po at bedtime  Insomnia. At home on Trazodone 100 mg prn at bedtime.  - Trazodone restarted on 4/6 at 61m qhs prn sleep  Hypothyroidism, stable.Last TSH 0.639 and wnl, on 07/01/2016. At home on Synthroid.  -Continue home Synthroid 75 mcg   DDD with spinal stenosis of lumbar region, stable. Denies acute symptoms. At home on Norco 10-325 Q4 PRN.  -Continue home pain med  Abdominal pain/nausea/vomiting/decreased po intake, resolved:likely due to post-cholecystectomy diarrhea considered (lap chole 11/2015) - symptoms resolved on cholestyramine (4g packet Qd) -Tylenol 650 mg Q6 prn for pain  - Zofran 4 mg Q6 prn for nausea   AKI on CKD III, resolved:SCr at baseline ~1. Peak SCr 3.49 on admission. Likely dehydration from decreased PO.  -Continue home sodium bicarb 650 mg daily   FEN/GI: heart healthy diet, protonix, colace, K>4.0 (repleted 4/9), Mag >2.0 PPx: heparin  Disposition: SNF, pending placement  Subjective:  Patient doing well this AM, no acute events overnight. Was noted to be hypotensive to 96/56 yesterday however improved with switching to  PO lasix. Will continue to monitor. Patient agreeable to SNF.  Objective: Temp:  [97.6 F (36.4 C)-98.5 F (36.9 C)] 98.4 F (36.9 C) (04/09 0509) Pulse Rate:  [71-93] 81 (04/09 0509) Resp:  [17-24] 17 (04/09 0509) BP: (96-127)/(56-74) 127/56 (04/09 0509) SpO2:  [91 %-100 %] 98 % (04/09 0509) Physical  Exam: General: NAD, rests comfortably, pleasant and interactive Cardiovascular: RRR, no m/r/g Respiratory: poor air movement, decreased breath sound sthroughout, no W/R/R Abdomen: soft and mildly tender to palpation, nondistended Extremities: chronic bil shin skin changes, no edema  I/O last 3 completed shifts: In: 1283 [P.O.:1280; I.V.:3] Out: 2900 [Urine:2900] No intake/output data recorded.   Filed Weights   01/13/17 0540 01/14/17 0355 01/15/17 0500  Weight: 193 lb (87.5 kg) 182 lb 9.6 oz (82.8 kg) 177 lb 6.4 oz (80.5 kg)    Laboratory:  Recent Labs Lab 01/12/17 0230 01/13/17 0201 01/14/17 0304  WBC 5.6 5.9 4.9  HGB 9.0* 9.9* 9.2*  HCT 33.4* 36.8 35.8*  PLT 118* 147* 151    Recent Labs Lab 01/11/17 1149  01/13/17 0201 01/14/17 0304 01/16/17 0454  NA 145  < > 142 140 140  K 5.2*  < > 3.4* 3.5 3.5  CL 108  < > 86* 82* 88*  CO2 29  < > 45* 46* 41*  BUN 16  < > _0 CREATININE 1.07*  < > 1.08* 1.06* 1.20*  CALCIUM 8.6*  < > 8.9 9.0 9.4  PROT 4.7*  --   --   --   --   BILITOT 1.0  --   --   --   --   ALKPHOS 67  --   --   --   --   ALT 25  --   --   --   --   AST 32  --   --   --   --   GLUCOSE 98  < > 89 81 90  < > = values in this interval not displayed.  Hepatitis panel negative. HIV nonreactive. FOBT negative C diff negative Ferritin 27, Fe 11, TIBC 428  Imaging/Diagnostic Tests:  Cardiac Echo Study Conclusions - Procedure narrative: Transthoracic echocardiography. Image quality was adequate. The study was technically difficult, as a result of body habitus. - Left ventricle: The cavity size was normal. Wall thickness was increased in a pattern of mild LVH. Systolic function was normal. The estimated ejection fraction was in the range of 60% to 65%. Wall motion was normal; there were no regional wall motion abnormalities. Doppler parameters are consistent with abnormal left ventricular relaxation (grade 1 diastolic dysfunction).  The Ee&' ratio is between 8-15, suggesting indeterminate LV filling pressure. - Ventricular septum: Septal motion showed paradox. The contour showed diastolic flattening and systolic flattening. This suggests pressure and volume overload of the RV. - Mitral valve: Calcified annulus. Mildly thickened leaflets . There was trivial regurgitation. - Left atrium: The atrium was normal in size. - Right ventricle: The cavity size was moderately dilated. Mildly reduced systolic function. - Right atrium: Severely dilated. - Tricuspid valve: There was moderate regurgitation. - Pulmonary arteries: PA peak pressure: 75 mm Hg (S). - Inferior vena cava: The vessel was dilated. The respirophasic diameter changes were blunted (<50%), consistent with elevated central venous pressure.  Impressions: - Technically difficult study. LVEF 60-65%, mild LVH, normal wall motion, grade 1 DD with indeterminate LV filling pressure, moderate RAE with mildly reduced RV systolic function, ventricular septal flattening (Septal-D sign) and paradoxical  motion consistent with pressure and volume overload of the RV, severe RAE, moderate TR, RVSP 75 mmHg, dilated IVC (but no clear flow reversal in the hepatic vein), no pericardial effusion.   Everrett Coombe, MD 01/16/2017, 9:25 AM PGY-1, [place] Intern pager: [phone], text pages welcome

## 2017-01-17 ENCOUNTER — Inpatient Hospital Stay (HOSPITAL_COMMUNITY): Payer: Medicare Other

## 2017-01-17 DIAGNOSIS — E440 Moderate protein-calorie malnutrition: Secondary | ICD-10-CM | POA: Diagnosis not present

## 2017-01-17 DIAGNOSIS — R0600 Dyspnea, unspecified: Secondary | ICD-10-CM | POA: Diagnosis not present

## 2017-01-17 DIAGNOSIS — G9340 Encephalopathy, unspecified: Secondary | ICD-10-CM | POA: Diagnosis not present

## 2017-01-17 DIAGNOSIS — J9622 Acute and chronic respiratory failure with hypercapnia: Secondary | ICD-10-CM | POA: Diagnosis not present

## 2017-01-17 DIAGNOSIS — M6281 Muscle weakness (generalized): Secondary | ICD-10-CM | POA: Diagnosis not present

## 2017-01-17 DIAGNOSIS — I5033 Acute on chronic diastolic (congestive) heart failure: Secondary | ICD-10-CM | POA: Diagnosis not present

## 2017-01-17 DIAGNOSIS — J810 Acute pulmonary edema: Secondary | ICD-10-CM | POA: Diagnosis not present

## 2017-01-17 DIAGNOSIS — N183 Chronic kidney disease, stage 3 (moderate): Secondary | ICD-10-CM | POA: Diagnosis not present

## 2017-01-17 DIAGNOSIS — Z66 Do not resuscitate: Secondary | ICD-10-CM | POA: Diagnosis not present

## 2017-01-17 DIAGNOSIS — I509 Heart failure, unspecified: Secondary | ICD-10-CM | POA: Diagnosis not present

## 2017-01-17 DIAGNOSIS — J449 Chronic obstructive pulmonary disease, unspecified: Secondary | ICD-10-CM | POA: Diagnosis not present

## 2017-01-17 DIAGNOSIS — I472 Ventricular tachycardia: Secondary | ICD-10-CM | POA: Diagnosis not present

## 2017-01-17 DIAGNOSIS — R740 Nonspecific elevation of levels of transaminase and lactic acid dehydrogenase [LDH]: Secondary | ICD-10-CM | POA: Diagnosis not present

## 2017-01-17 DIAGNOSIS — J9602 Acute respiratory failure with hypercapnia: Secondary | ICD-10-CM | POA: Diagnosis not present

## 2017-01-17 DIAGNOSIS — R41841 Cognitive communication deficit: Secondary | ICD-10-CM | POA: Diagnosis not present

## 2017-01-17 DIAGNOSIS — R531 Weakness: Secondary | ICD-10-CM | POA: Diagnosis not present

## 2017-01-17 DIAGNOSIS — K5909 Other constipation: Secondary | ICD-10-CM | POA: Diagnosis not present

## 2017-01-17 DIAGNOSIS — I272 Pulmonary hypertension, unspecified: Secondary | ICD-10-CM | POA: Diagnosis not present

## 2017-01-17 DIAGNOSIS — I2723 Pulmonary hypertension due to lung diseases and hypoxia: Secondary | ICD-10-CM | POA: Diagnosis not present

## 2017-01-17 DIAGNOSIS — R000 Tachycardia, unspecified: Secondary | ICD-10-CM | POA: Diagnosis not present

## 2017-01-17 DIAGNOSIS — R2681 Unsteadiness on feet: Secondary | ICD-10-CM | POA: Diagnosis not present

## 2017-01-17 DIAGNOSIS — I248 Other forms of acute ischemic heart disease: Secondary | ICD-10-CM | POA: Diagnosis not present

## 2017-01-17 DIAGNOSIS — R0602 Shortness of breath: Secondary | ICD-10-CM | POA: Diagnosis not present

## 2017-01-17 DIAGNOSIS — I5032 Chronic diastolic (congestive) heart failure: Secondary | ICD-10-CM | POA: Diagnosis not present

## 2017-01-17 DIAGNOSIS — Z515 Encounter for palliative care: Secondary | ICD-10-CM | POA: Diagnosis not present

## 2017-01-17 DIAGNOSIS — N179 Acute kidney failure, unspecified: Secondary | ICD-10-CM | POA: Diagnosis not present

## 2017-01-17 DIAGNOSIS — R2689 Other abnormalities of gait and mobility: Secondary | ICD-10-CM | POA: Diagnosis not present

## 2017-01-17 DIAGNOSIS — I10 Essential (primary) hypertension: Secondary | ICD-10-CM | POA: Diagnosis not present

## 2017-01-17 DIAGNOSIS — J9620 Acute and chronic respiratory failure, unspecified whether with hypoxia or hypercapnia: Secondary | ICD-10-CM | POA: Diagnosis not present

## 2017-01-17 DIAGNOSIS — I5082 Biventricular heart failure: Secondary | ICD-10-CM | POA: Diagnosis not present

## 2017-01-17 DIAGNOSIS — R278 Other lack of coordination: Secondary | ICD-10-CM | POA: Diagnosis not present

## 2017-01-17 DIAGNOSIS — I130 Hypertensive heart and chronic kidney disease with heart failure and stage 1 through stage 4 chronic kidney disease, or unspecified chronic kidney disease: Secondary | ICD-10-CM | POA: Diagnosis not present

## 2017-01-17 DIAGNOSIS — E872 Acidosis: Secondary | ICD-10-CM | POA: Diagnosis not present

## 2017-01-17 DIAGNOSIS — K7460 Unspecified cirrhosis of liver: Secondary | ICD-10-CM | POA: Diagnosis not present

## 2017-01-17 LAB — BASIC METABOLIC PANEL
Anion gap: 9 (ref 5–15)
BUN: 19 mg/dL (ref 6–20)
CHLORIDE: 91 mmol/L — AB (ref 101–111)
CO2: 41 mmol/L — ABNORMAL HIGH (ref 22–32)
Calcium: 9.5 mg/dL (ref 8.9–10.3)
Creatinine, Ser: 1.17 mg/dL — ABNORMAL HIGH (ref 0.44–1.00)
GFR calc Af Amer: 50 mL/min — ABNORMAL LOW (ref >60)
GFR calc non Af Amer: 43 mL/min — ABNORMAL LOW (ref >60)
Glucose, Bld: 89 mg/dL (ref 65–99)
Potassium: 5 mmol/L (ref 3.5–5.1)
SODIUM: 141 mmol/L (ref 135–145)

## 2017-01-17 LAB — GLUCOSE, CAPILLARY: Glucose-Capillary: 91 mg/dL (ref 65–99)

## 2017-01-17 LAB — CBC
HCT: 36.1 % (ref 36.0–46.0)
HEMOGLOBIN: 9.4 g/dL — AB (ref 12.0–15.0)
MCH: 21.6 pg — AB (ref 26.0–34.0)
MCHC: 26 g/dL — AB (ref 30.0–36.0)
MCV: 83 fL (ref 78.0–100.0)
Platelets: 143 10*3/uL — ABNORMAL LOW (ref 150–400)
RBC: 4.35 MIL/uL (ref 3.87–5.11)
RDW: 23.3 % — ABNORMAL HIGH (ref 11.5–15.5)
WBC: 4.1 10*3/uL (ref 4.0–10.5)

## 2017-01-17 LAB — TROPONIN I: Troponin I: 0.12 ng/mL (ref <0.03)

## 2017-01-17 LAB — MAGNESIUM: MAGNESIUM: 1.9 mg/dL (ref 1.7–2.4)

## 2017-01-17 MED ORDER — IPRATROPIUM-ALBUTEROL 0.5-2.5 (3) MG/3ML IN SOLN
3.0000 mL | Freq: Four times a day (QID) | RESPIRATORY_TRACT | Status: DC
  Administered 2017-01-17: 3 mL via RESPIRATORY_TRACT
  Filled 2017-01-17: qty 3

## 2017-01-17 MED ORDER — FERROUS SULFATE 325 (65 FE) MG PO TABS: 325.0000 mg | ORAL_TABLET | Freq: Every day | ORAL | 3 refills | Status: AC

## 2017-01-17 MED ORDER — IPRATROPIUM-ALBUTEROL 0.5-2.5 (3) MG/3ML IN SOLN
3.0000 mL | Freq: Three times a day (TID) | RESPIRATORY_TRACT | 0 refills | Status: AC
Start: 2017-01-17 — End: ?

## 2017-01-17 MED ORDER — FUROSEMIDE 20 MG PO TABS
60.0000 mg | ORAL_TABLET | Freq: Two times a day (BID) | ORAL | 0 refills | Status: AC
Start: 2017-01-17 — End: ?

## 2017-01-17 MED ORDER — POTASSIUM CHLORIDE CRYS ER 20 MEQ PO TBCR: 40.0000 meq | EXTENDED_RELEASE_TABLET | Freq: Every day | ORAL | 0 refills | Status: AC

## 2017-01-17 MED ORDER — IPRATROPIUM-ALBUTEROL 0.5-2.5 (3) MG/3ML IN SOLN
3.0000 mL | Freq: Three times a day (TID) | RESPIRATORY_TRACT | Status: DC
  Administered 2017-01-17: 3 mL via RESPIRATORY_TRACT
  Filled 2017-01-17: qty 3

## 2017-01-17 MED ORDER — CHOLESTYRAMINE 4 G PO PACK: 4.0000 g | PACK | Freq: Every day | ORAL | 0 refills | Status: AC

## 2017-01-17 MED ORDER — METOPROLOL TARTRATE 50 MG PO TABS: 50.0000 mg | ORAL_TABLET | Freq: Two times a day (BID) | ORAL | 0 refills | Status: AC

## 2017-01-17 MED ORDER — MAGNESIUM SULFATE IN D5W 1-5 GM/100ML-% IV SOLN
1.0000 g | Freq: Once | INTRAVENOUS | Status: AC
  Administered 2017-01-17: 1 g via INTRAVENOUS
  Filled 2017-01-17: qty 100

## 2017-01-17 NOTE — Progress Notes (Signed)
Family Medicine Teaching Service Daily Progress Note Intern Pager: [phone]  Patient name: Carolyn Hendricks Medical record number: [id number] Date of birth: 02/28/1936 Age: 81 y.o. Gender: female  Primary Care Provider: [name], MD Consultants: Cardiology, Palliative Care Code Status: Full  Pt Overview and Major Events to Date:  3/30 - Admitted to FPTS for inability to tolerate PO 4/4 - R heart cath Severe mixed pulmonary HTN (WHO Group II & III) with evidence of RV failure/low output and volume overload, Rapid response also called for dec responsiveness, pt placed on Bipap and transferred to step down, improved mentation throughout the day 4/7 DNR status by palliative care  Assessment and Plan: Carolyn Hendricks a 81 y.o.femalepresenting with decreased po intake and abdominal pain. PMH is significant for CAD, asthma, HTN, hypothyroidism with goiter, CKDIII, DDD with spinal stenosis of lumbar region, CHF, HLD, anxiety, acute renal failure, obesity and history of tobacco use. Has had cholecystectomy and appendectomy.   Acute episode of Diaphoresis/CP/Tachycardia/dyspnea - This AM. Concern for acute cardiac event. Mild CP. Feels very diaphoretic like a "hot flash" but she says she does not usually get these. Mild chest pain when directly asked.  Tachycardic with PVCs and ?irregular rhythm on exam. Diffuse wheezes.  - stat troponin (0.12), CXR (unchanged), EKG (NSR) - will trend trops - patient on O2 by [place] - Stat Duonebs - if troponin trend down and patient remains clinically well-appearing, may still be able to DC today  CHF, right heart failure, worsened:Severe pulmonary hypertension with poor prognosis. At home on Imdur 30 mg daily, Lopressor 100 mg BID.  - Continue home Imdur and Lopressor.  - continue 60 mg PO Lasix - cardiology signed off - palliative consult, apprec recs  Acute on Chronic Respiratory Failure, improved. At home on Advair diskus. Not on formulary but  ordered as Breo Ellipta.  - Continue Breo Ellipta 1 puff daily - Duonebs PRN - continue home O2 by [place] (2L)   AKI on CKD III, improving:SCr at baseline ~1. Peak SCr 3.49 on admission >>>1.17 from 1.20 yesterday. Likely dehydration from decreased PO and aggressive diuresis -Continue home sodium bicarb 650 mg daily   HTN, stable- At home on Imdur 30 mg daily, Lopressor 100 mg BID, and Norvasc 10 mg daily .  - Continue home Imdur and Lopressor.  - Continue Lasix as above - Holding Norvasc in setting of leg swelling  - Monitor blood pressures in the setting of ongoing diuresis  CAD, stable: Troponins trended down 0.12 > 0.09> 0.06 - continue home aspirin, Zocor 20 mg and Plavix   Anemia acute on chronic, stable. At baseline Hgb ~9. Hgb stable today. - FeSO4 supplementation 325 mg QD started 4/3 - transfusion threshold 8  Weakness, generalized:likely related to poor PO intake although degenerative disc disease is probably contributing to history of falls. Has had recent fall but did not hit head. No LOC.  - PT/OT : SNF  - waiting placement  Anxiety/Depression:At home on Zoloft. Per daughter, used to take Ativan but no longer is on it.  -Continue Zoloft 50 mg po at bedtime  Insomnia. At home on Trazodone 100 mg prn at bedtime.  - Trazodone restarted on 4/6 at 7m qhs prn sleep  Hypothyroidism, stable.Last TSH 0.639 and wnl, on 07/01/2016. At home on Synthroid.  -Continue home Synthroid 75 mcg   DDD with spinal stenosis of lumbar region, stable. Denies acute symptoms. At home on Norco 10-325 Q4 PRN.  -Continue home pain med  Abdominal  pain/nausea/vomiting/decreased po intake, resolved:likely due to post-cholecystectomy diarrhea considered (lap chole 11/2015) - symptoms resolved on cholestyramine (4g packet Qd) -Tylenol 650 mg Q6 prn for pain  - Zofran 4 mg Q6 prn for nausea    FEN/GI: heart healthy diet, protonix, colace, K>4.0 (repleted 4/9), Mag >2.0 PPx:  heparin  Disposition: SNF, pending placement  Subjective:  Patient endorses diaphoresis she said felt like a "hot flash" but she does not usually get these when I entered the room this AM. On exam, noted tachycardia with questionable irregular rhythm (difficult to assess with tachy), PVCs noted. When asked she does endorse shortness of breath and mild chest pain. Stated she "does not want to be selfish" which is why she did not alert anyone of these symptoms.  Stat labs, CXR, EKG ordered as noted in plan.  Objective: Temp:  [97.9 F (36.6 C)-98.6 F (37 C)] 97.9 F (36.6 C) (04/10 0434) Pulse Rate:  [77-89] 80 (04/10 0932) Resp:  [18-29] 29 (04/10 0434) BP: (111-131)/(49-91) 131/62 (04/10 0434) SpO2:  [95 %-98 %] 98 % (04/10 0810) Physical Exam: General: Diaphoretic but nontoxic appearing, sits in bed, convsersing normally Cardiovascular: +tachycardia, +PVCs, ?irregular rhythm (difficult to assess with tachy) Respiratory: poor air movement, decreased breath sounds, +diffuse wheezes appreciated in anterior lung fields bilaterally throughout, no rhonchi or rales Abdomen: soft and mildly tender to palpation, nondistended Extremities: chronic bil shin skin changes, +trace edema  I/O last 3 completed shifts: In: 20 [P.O.:120; I.V.:3] Out: 1250 [Urine:1250] No intake/output data recorded.   Filed Weights   01/13/17 0540 01/14/17 0355 01/15/17 0500  Weight: 193 lb (87.5 kg) 182 lb 9.6 oz (82.8 kg) 177 lb 6.4 oz (80.5 kg)    Laboratory:  Recent Labs Lab 01/13/17 0201 01/14/17 0304 01/17/17 0424  WBC 5.9 4.9 4.1  HGB 9.9* 9.2* 9.4*  HCT 36.8 35.8* 36.1  PLT 147* 151 143*    Recent Labs Lab 01/11/17 1149  01/14/17 0304 01/16/17 0454 01/17/17 0424  NA 145  < > 140 140 141  K 5.2*  < > 3.5 3.5 5.0  CL 108  < > 82* 88* 91*  CO2 29  < > 46* 41* 41*  BUN 16  < > _0 CREATININE 1.07*  < > 1.06* 1.20* 1.17*  CALCIUM 8.6*  < > 9.0 9.4 9.5  PROT 4.7*  --   --   --   --    BILITOT 1.0  --   --   --   --   ALKPHOS 67  --   --   --   --   ALT 25  --   --   --   --   AST 32  --   --   --   --   GLUCOSE 98  < > 81 90 89  < > = values in this interval not displayed.  Hepatitis panel negative. HIV nonreactive. FOBT negative C diff negative Ferritin 27, Fe 11, TIBC 428  Imaging/Diagnostic Tests:  Cardiac Echo Study Conclusions - Procedure narrative: Transthoracic echocardiography. Image quality was adequate. The study was technically difficult, as a result of body habitus. - Left ventricle: The cavity size was normal. Wall thickness was increased in a pattern of mild LVH. Systolic function was normal. The estimated ejection fraction was in the range of 60% to 65%. Wall motion was normal; there were no regional wall motion abnormalities. Doppler parameters are consistent with abnormal left ventricular relaxation (grade 1 diastolic dysfunction).  The Ee&' ratio is between 8-15, suggesting indeterminate LV filling pressure. - Ventricular septum: Septal motion showed paradox. The contour showed diastolic flattening and systolic flattening. This suggests pressure and volume overload of the RV. - Mitral valve: Calcified annulus. Mildly thickened leaflets . There was trivial regurgitation. - Left atrium: The atrium was normal in size. - Right ventricle: The cavity size was moderately dilated. Mildly reduced systolic function. - Right atrium: Severely dilated. - Tricuspid valve: There was moderate regurgitation. - Pulmonary arteries: PA peak pressure: 75 mm Hg (S). - Inferior vena cava: The vessel was dilated. The respirophasic diameter changes were blunted (<50%), consistent with elevated central venous pressure.  Impressions: - Technically difficult study. LVEF 60-65%, mild LVH, normal wall motion, grade 1 DD with indeterminate LV filling pressure, moderate RAE with mildly reduced RV systolic  function, ventricular septal flattening (Septal-D sign) and paradoxical motion consistent with pressure and volume overload of the RV, severe RAE, moderate TR, RVSP 75 mmHg, dilated IVC (but no clear flow reversal in the hepatic vein), no pericardial effusion.   Everrett Coombe, MD 01/17/2017, 9:48 AM PGY-1, [facility] Intern pager: [phone], text pages welcome

## 2017-01-17 NOTE — Progress Notes (Signed)
CRITICAL VALUE ALERT  Critical value received:  Troponin   Date of notification:  01/17/17  Time of notification:  1030  Critical value read back:  Nurse who received alert:  [facility]  MD notified (1st page):  Maryjean Morn  Time of first page:  1050  MD notified (2nd page): Maryjean Morn  Time of second page:1130  Responding MD:  none  Time MD responded:  None, paged twice with the second message stating please call back to acknowledge and got no call back, but the attending MD put in a repeat of the lab that was abnormal so I believe they did address it.

## 2017-01-17 NOTE — Progress Notes (Addendum)
Patient transferred to [facility] in [place], family at bedside and aware of transfer. Report given to Santiago Glad at facility.

## 2017-01-17 NOTE — Progress Notes (Signed)
Patient has bed at [facility] when stable for DC.  Per MD patient with irregular pulse this morning and needing further work up to make sure stable for DC.  CSW will continue to follow  Jorge Ny, LCSW Clinical Social Worker [phone]

## 2017-01-17 NOTE — Progress Notes (Signed)
FPTS Interim Progress Note  Patient bumped troponin to 0.12 this AM with diaphoretic episode, noted to have PVCs on monitor, otherwise NSR. Spoke with Cardiology Dr. Katharina Caper who pointed out this patient's heart failure is very severe (end stage right hear failure), she is likely to bump troponin secondary to demand due to her pulmonary hypertension. Patient would be good candidate for comfort measures.  Will DC cardiac monitors, DC trend troponin, as patient will not be candidate for further cardiology intervention.  Carolyn Coombe, MD 01/17/2017, 3:38 PM PGY-1, [facility] Medicine Service pager [phone]

## 2017-01-17 NOTE — Progress Notes (Signed)
Discharge paperwork given to patient's daughter.

## 2017-01-17 NOTE — Progress Notes (Signed)
Patient will discharge to: [facility] Anticipated DC date: 01-17-2017 Family notified:Daughter at bedside Transportation by: Corey Harold contacted 4:22pm   CSW signing off  Elissa Hefty, [place] Social Worker

## 2017-01-17 NOTE — Clinical Social Work Placement (Signed)
   CLINICAL SOCIAL WORK PLACEMENT  NOTE  Date:  01/17/2017  Patient Details  Name: Carolyn Hendricks MRN: [id number] Date of Birth: [birth year]  Clinical Social Work is seeking post-discharge placement for this patient at the [place] level of care (*CSW will initial, date and re-position this form in  chart as items are completed):  Yes   Patient/family provided with [place] Work Department's list of facilities offering this level of care within the geographic area requested by the patient (or if unable, by the patient's family).  Yes   Patient/family informed of their freedom to choose among providers that offer the needed level of care, that participate in Medicare, Medicaid or managed care program needed by the patient, have an available bed and are willing to accept the patient.  Yes   Patient/family informed of [facility]'s ownership interest in [facility] and [facility], as well as of the fact that they are under no obligation to receive care at these facilities.  PASRR submitted to EDS on       PASRR number received on       Existing PASRR number confirmed on 01/16/17     FL2 transmitted to all facilities in geographic area requested by pt/family on 01/16/17     FL2 transmitted to all facilities within larger geographic area on       Patient informed that his/her managed care company has contracts with or will negotiate with certain facilities, including the following:            Patient/family informed of bed offers received.  Patient chooses bed at [facility], [place]     Physician recommends and patient chooses bed at      Patient to be transferred to [place], [place] on 01/17/17.  Patient to be transferred to facility by [facility]     Patient family notified on 01/17/17 of transfer.  Name of family member notified:  Daughter     PHYSICIAN Please prepare priority discharge summary, including medications, Please  sign FL2, Please sign DNR     Additional Comment:    _______________________________________________ Normajean Baxter, LCSW 01/17/2017, 4:29 PM

## 2017-01-20 DIAGNOSIS — R2689 Other abnormalities of gait and mobility: Secondary | ICD-10-CM | POA: Diagnosis not present

## 2017-01-20 DIAGNOSIS — K5909 Other constipation: Secondary | ICD-10-CM | POA: Diagnosis not present

## 2017-01-20 DIAGNOSIS — J449 Chronic obstructive pulmonary disease, unspecified: Secondary | ICD-10-CM | POA: Diagnosis not present

## 2017-01-20 DIAGNOSIS — J9620 Acute and chronic respiratory failure, unspecified whether with hypoxia or hypercapnia: Secondary | ICD-10-CM | POA: Diagnosis not present

## 2017-01-20 DIAGNOSIS — K7460 Unspecified cirrhosis of liver: Secondary | ICD-10-CM | POA: Diagnosis not present

## 2017-01-20 DIAGNOSIS — E872 Acidosis: Secondary | ICD-10-CM | POA: Diagnosis not present

## 2017-01-23 DIAGNOSIS — R531 Weakness: Secondary | ICD-10-CM | POA: Diagnosis not present

## 2017-01-29 DIAGNOSIS — R000 Tachycardia, unspecified: Secondary | ICD-10-CM | POA: Diagnosis not present

## 2017-01-29 DIAGNOSIS — J449 Chronic obstructive pulmonary disease, unspecified: Secondary | ICD-10-CM | POA: Diagnosis not present

## 2017-01-29 DIAGNOSIS — I10 Essential (primary) hypertension: Secondary | ICD-10-CM | POA: Diagnosis not present

## 2017-02-01 DIAGNOSIS — R531 Weakness: Secondary | ICD-10-CM | POA: Diagnosis not present

## 2017-02-02 ENCOUNTER — Telehealth: Payer: Self-pay

## 2017-02-02 NOTE — Telephone Encounter (Signed)
Called to schedule carotid fu 1 year

## 2017-02-15 DIAGNOSIS — R531 Weakness: Secondary | ICD-10-CM | POA: Diagnosis not present

## 2017-02-15 DIAGNOSIS — M79674 Pain in right toe(s): Secondary | ICD-10-CM | POA: Diagnosis not present

## 2017-02-15 DIAGNOSIS — I739 Peripheral vascular disease, unspecified: Secondary | ICD-10-CM | POA: Diagnosis not present

## 2017-02-15 DIAGNOSIS — L600 Ingrowing nail: Secondary | ICD-10-CM | POA: Diagnosis not present

## 2017-02-15 DIAGNOSIS — B351 Tinea unguium: Secondary | ICD-10-CM | POA: Diagnosis not present

## 2017-02-24 NOTE — Telephone Encounter (Signed)
Spoke with Hilda Blades - daughter   Patient has decided not to do testing.  Per daughter patient not doing well and is now in a facility

## 2017-02-26 DIAGNOSIS — R509 Fever, unspecified: Secondary | ICD-10-CM | POA: Diagnosis not present

## 2017-02-26 DIAGNOSIS — I5033 Acute on chronic diastolic (congestive) heart failure: Secondary | ICD-10-CM | POA: Diagnosis not present

## 2017-02-26 DIAGNOSIS — J449 Chronic obstructive pulmonary disease, unspecified: Secondary | ICD-10-CM | POA: Diagnosis not present

## 2017-02-26 DIAGNOSIS — I2723 Pulmonary hypertension due to lung diseases and hypoxia: Secondary | ICD-10-CM | POA: Diagnosis not present

## 2017-02-26 DIAGNOSIS — I10 Essential (primary) hypertension: Secondary | ICD-10-CM | POA: Diagnosis not present

## 2017-02-26 DIAGNOSIS — J9622 Acute and chronic respiratory failure with hypercapnia: Secondary | ICD-10-CM | POA: Diagnosis not present

## 2017-02-26 DIAGNOSIS — L02416 Cutaneous abscess of left lower limb: Secondary | ICD-10-CM | POA: Diagnosis not present

## 2017-02-26 DIAGNOSIS — R638 Other symptoms and signs concerning food and fluid intake: Secondary | ICD-10-CM | POA: Diagnosis not present

## 2017-02-26 DIAGNOSIS — R0602 Shortness of breath: Secondary | ICD-10-CM | POA: Diagnosis not present

## 2017-02-26 DIAGNOSIS — R109 Unspecified abdominal pain: Secondary | ICD-10-CM | POA: Diagnosis not present

## 2017-02-26 DIAGNOSIS — I50810 Right heart failure, unspecified: Secondary | ICD-10-CM | POA: Diagnosis not present

## 2017-02-26 DIAGNOSIS — R4182 Altered mental status, unspecified: Secondary | ICD-10-CM | POA: Diagnosis not present

## 2017-02-27 DIAGNOSIS — L02419 Cutaneous abscess of limb, unspecified: Secondary | ICD-10-CM | POA: Diagnosis not present

## 2017-02-27 DIAGNOSIS — R509 Fever, unspecified: Secondary | ICD-10-CM | POA: Diagnosis not present

## 2017-03-01 DIAGNOSIS — L02416 Cutaneous abscess of left lower limb: Secondary | ICD-10-CM | POA: Diagnosis not present

## 2017-03-04 DIAGNOSIS — J449 Chronic obstructive pulmonary disease, unspecified: Secondary | ICD-10-CM | POA: Diagnosis not present

## 2017-03-04 DIAGNOSIS — L02416 Cutaneous abscess of left lower limb: Secondary | ICD-10-CM | POA: Diagnosis not present

## 2017-03-04 DIAGNOSIS — I2510 Atherosclerotic heart disease of native coronary artery without angina pectoris: Secondary | ICD-10-CM | POA: Diagnosis not present

## 2017-03-14 DIAGNOSIS — R531 Weakness: Secondary | ICD-10-CM | POA: Diagnosis not present

## 2017-03-15 DIAGNOSIS — L02416 Cutaneous abscess of left lower limb: Secondary | ICD-10-CM | POA: Diagnosis not present

## 2017-04-30 DIAGNOSIS — H6123 Impacted cerumen, bilateral: Secondary | ICD-10-CM | POA: Diagnosis not present

## 2017-04-30 DIAGNOSIS — G4709 Other insomnia: Secondary | ICD-10-CM | POA: Diagnosis not present

## 2017-04-30 DIAGNOSIS — M545 Low back pain: Secondary | ICD-10-CM | POA: Diagnosis not present

## 2017-05-04 DIAGNOSIS — M545 Low back pain: Secondary | ICD-10-CM | POA: Diagnosis not present

## 2017-05-05 DIAGNOSIS — N390 Urinary tract infection, site not specified: Secondary | ICD-10-CM | POA: Diagnosis not present

## 2017-05-10 DIAGNOSIS — B351 Tinea unguium: Secondary | ICD-10-CM | POA: Diagnosis not present

## 2017-05-10 DIAGNOSIS — M79674 Pain in right toe(s): Secondary | ICD-10-CM | POA: Diagnosis not present

## 2017-05-10 DIAGNOSIS — L603 Nail dystrophy: Secondary | ICD-10-CM | POA: Diagnosis not present

## 2017-05-10 DIAGNOSIS — M79675 Pain in left toe(s): Secondary | ICD-10-CM | POA: Diagnosis not present

## 2017-05-10 DIAGNOSIS — I739 Peripheral vascular disease, unspecified: Secondary | ICD-10-CM | POA: Diagnosis not present

## 2017-05-25 DIAGNOSIS — Z7951 Long term (current) use of inhaled steroids: Secondary | ICD-10-CM | POA: Diagnosis not present

## 2017-05-25 DIAGNOSIS — H2513 Age-related nuclear cataract, bilateral: Secondary | ICD-10-CM | POA: Diagnosis not present

## 2017-05-25 DIAGNOSIS — R531 Weakness: Secondary | ICD-10-CM | POA: Diagnosis not present

## 2017-06-14 DIAGNOSIS — R531 Weakness: Secondary | ICD-10-CM | POA: Diagnosis not present

## 2017-07-10 DIAGNOSIS — M545 Low back pain: Secondary | ICD-10-CM | POA: Diagnosis not present

## 2017-07-10 DIAGNOSIS — R54 Age-related physical debility: Secondary | ICD-10-CM | POA: Diagnosis not present

## 2017-07-10 DIAGNOSIS — M6281 Muscle weakness (generalized): Secondary | ICD-10-CM | POA: Diagnosis not present

## 2017-07-10 DIAGNOSIS — R2681 Unsteadiness on feet: Secondary | ICD-10-CM | POA: Diagnosis not present

## 2017-07-13 DIAGNOSIS — R2681 Unsteadiness on feet: Secondary | ICD-10-CM | POA: Diagnosis not present

## 2017-07-13 DIAGNOSIS — M6281 Muscle weakness (generalized): Secondary | ICD-10-CM | POA: Diagnosis not present

## 2017-07-13 DIAGNOSIS — M545 Low back pain: Secondary | ICD-10-CM | POA: Diagnosis not present

## 2017-07-13 DIAGNOSIS — R54 Age-related physical debility: Secondary | ICD-10-CM | POA: Diagnosis not present

## 2017-07-14 DIAGNOSIS — M6281 Muscle weakness (generalized): Secondary | ICD-10-CM | POA: Diagnosis not present

## 2017-07-14 DIAGNOSIS — R2681 Unsteadiness on feet: Secondary | ICD-10-CM | POA: Diagnosis not present

## 2017-07-14 DIAGNOSIS — R54 Age-related physical debility: Secondary | ICD-10-CM | POA: Diagnosis not present

## 2017-07-14 DIAGNOSIS — M545 Low back pain: Secondary | ICD-10-CM | POA: Diagnosis not present

## 2017-07-17 DIAGNOSIS — M545 Low back pain: Secondary | ICD-10-CM | POA: Diagnosis not present

## 2017-07-17 DIAGNOSIS — R2681 Unsteadiness on feet: Secondary | ICD-10-CM | POA: Diagnosis not present

## 2017-07-17 DIAGNOSIS — M6281 Muscle weakness (generalized): Secondary | ICD-10-CM | POA: Diagnosis not present

## 2017-07-17 DIAGNOSIS — R54 Age-related physical debility: Secondary | ICD-10-CM | POA: Diagnosis not present

## 2017-07-18 DIAGNOSIS — R54 Age-related physical debility: Secondary | ICD-10-CM | POA: Diagnosis not present

## 2017-07-18 DIAGNOSIS — R2681 Unsteadiness on feet: Secondary | ICD-10-CM | POA: Diagnosis not present

## 2017-07-18 DIAGNOSIS — M545 Low back pain: Secondary | ICD-10-CM | POA: Diagnosis not present

## 2017-07-18 DIAGNOSIS — M6281 Muscle weakness (generalized): Secondary | ICD-10-CM | POA: Diagnosis not present

## 2017-07-19 DIAGNOSIS — M6281 Muscle weakness (generalized): Secondary | ICD-10-CM | POA: Diagnosis not present

## 2017-07-19 DIAGNOSIS — R54 Age-related physical debility: Secondary | ICD-10-CM | POA: Diagnosis not present

## 2017-07-19 DIAGNOSIS — R2681 Unsteadiness on feet: Secondary | ICD-10-CM | POA: Diagnosis not present

## 2017-07-19 DIAGNOSIS — M545 Low back pain: Secondary | ICD-10-CM | POA: Diagnosis not present

## 2017-07-25 DIAGNOSIS — R54 Age-related physical debility: Secondary | ICD-10-CM | POA: Diagnosis not present

## 2017-07-25 DIAGNOSIS — R2681 Unsteadiness on feet: Secondary | ICD-10-CM | POA: Diagnosis not present

## 2017-07-25 DIAGNOSIS — M6281 Muscle weakness (generalized): Secondary | ICD-10-CM | POA: Diagnosis not present

## 2017-07-25 DIAGNOSIS — M545 Low back pain: Secondary | ICD-10-CM | POA: Diagnosis not present

## 2017-07-26 DIAGNOSIS — M6281 Muscle weakness (generalized): Secondary | ICD-10-CM | POA: Diagnosis not present

## 2017-07-26 DIAGNOSIS — M545 Low back pain: Secondary | ICD-10-CM | POA: Diagnosis not present

## 2017-07-26 DIAGNOSIS — R2681 Unsteadiness on feet: Secondary | ICD-10-CM | POA: Diagnosis not present

## 2017-07-26 DIAGNOSIS — R54 Age-related physical debility: Secondary | ICD-10-CM | POA: Diagnosis not present

## 2017-07-27 DIAGNOSIS — R54 Age-related physical debility: Secondary | ICD-10-CM | POA: Diagnosis not present

## 2017-07-27 DIAGNOSIS — M6281 Muscle weakness (generalized): Secondary | ICD-10-CM | POA: Diagnosis not present

## 2017-07-27 DIAGNOSIS — R2681 Unsteadiness on feet: Secondary | ICD-10-CM | POA: Diagnosis not present

## 2017-07-27 DIAGNOSIS — M545 Low back pain: Secondary | ICD-10-CM | POA: Diagnosis not present

## 2017-08-01 DIAGNOSIS — M6281 Muscle weakness (generalized): Secondary | ICD-10-CM | POA: Diagnosis not present

## 2017-08-01 DIAGNOSIS — R54 Age-related physical debility: Secondary | ICD-10-CM | POA: Diagnosis not present

## 2017-08-01 DIAGNOSIS — R2681 Unsteadiness on feet: Secondary | ICD-10-CM | POA: Diagnosis not present

## 2017-08-01 DIAGNOSIS — M545 Low back pain: Secondary | ICD-10-CM | POA: Diagnosis not present

## 2017-08-02 DIAGNOSIS — R54 Age-related physical debility: Secondary | ICD-10-CM | POA: Diagnosis not present

## 2017-08-02 DIAGNOSIS — M6281 Muscle weakness (generalized): Secondary | ICD-10-CM | POA: Diagnosis not present

## 2017-08-02 DIAGNOSIS — M545 Low back pain: Secondary | ICD-10-CM | POA: Diagnosis not present

## 2017-08-02 DIAGNOSIS — R2681 Unsteadiness on feet: Secondary | ICD-10-CM | POA: Diagnosis not present

## 2017-08-03 DIAGNOSIS — M6281 Muscle weakness (generalized): Secondary | ICD-10-CM | POA: Diagnosis not present

## 2017-08-03 DIAGNOSIS — R54 Age-related physical debility: Secondary | ICD-10-CM | POA: Diagnosis not present

## 2017-08-03 DIAGNOSIS — M545 Low back pain: Secondary | ICD-10-CM | POA: Diagnosis not present

## 2017-08-03 DIAGNOSIS — R2681 Unsteadiness on feet: Secondary | ICD-10-CM | POA: Diagnosis not present

## 2017-08-04 DIAGNOSIS — B351 Tinea unguium: Secondary | ICD-10-CM | POA: Diagnosis not present

## 2017-08-04 DIAGNOSIS — D6832 Hemorrhagic disorder due to extrinsic circulating anticoagulants: Secondary | ICD-10-CM | POA: Diagnosis not present

## 2017-08-04 DIAGNOSIS — L603 Nail dystrophy: Secondary | ICD-10-CM | POA: Diagnosis not present

## 2017-08-04 DIAGNOSIS — R262 Difficulty in walking, not elsewhere classified: Secondary | ICD-10-CM | POA: Diagnosis not present

## 2017-08-21 DIAGNOSIS — R531 Weakness: Secondary | ICD-10-CM | POA: Diagnosis not present

## 2017-08-30 DIAGNOSIS — E785 Hyperlipidemia, unspecified: Secondary | ICD-10-CM | POA: Diagnosis not present

## 2017-08-30 DIAGNOSIS — Z79899 Other long term (current) drug therapy: Secondary | ICD-10-CM | POA: Diagnosis not present

## 2017-08-30 DIAGNOSIS — E039 Hypothyroidism, unspecified: Secondary | ICD-10-CM | POA: Diagnosis not present

## 2017-08-30 DIAGNOSIS — I5020 Unspecified systolic (congestive) heart failure: Secondary | ICD-10-CM | POA: Diagnosis not present

## 2017-09-10 DIAGNOSIS — M1711 Unilateral primary osteoarthritis, right knee: Secondary | ICD-10-CM | POA: Diagnosis not present

## 2017-09-10 DIAGNOSIS — J449 Chronic obstructive pulmonary disease, unspecified: Secondary | ICD-10-CM | POA: Diagnosis not present

## 2017-09-10 DIAGNOSIS — M1712 Unilateral primary osteoarthritis, left knee: Secondary | ICD-10-CM | POA: Diagnosis not present

## 2017-09-10 DIAGNOSIS — I2510 Atherosclerotic heart disease of native coronary artery without angina pectoris: Secondary | ICD-10-CM | POA: Diagnosis not present

## 2017-09-25 DIAGNOSIS — R531 Weakness: Secondary | ICD-10-CM | POA: Diagnosis not present

## 2017-10-24 DIAGNOSIS — R531 Weakness: Secondary | ICD-10-CM | POA: Diagnosis not present

## 2017-11-16 DIAGNOSIS — B351 Tinea unguium: Secondary | ICD-10-CM | POA: Diagnosis not present

## 2017-11-16 DIAGNOSIS — I739 Peripheral vascular disease, unspecified: Secondary | ICD-10-CM | POA: Diagnosis not present

## 2017-11-16 DIAGNOSIS — R262 Difficulty in walking, not elsewhere classified: Secondary | ICD-10-CM | POA: Diagnosis not present

## 2017-11-28 DIAGNOSIS — H25813 Combined forms of age-related cataract, bilateral: Secondary | ICD-10-CM | POA: Diagnosis not present

## 2017-11-28 DIAGNOSIS — R531 Weakness: Secondary | ICD-10-CM | POA: Diagnosis not present

## 2017-12-04 DIAGNOSIS — H2512 Age-related nuclear cataract, left eye: Secondary | ICD-10-CM | POA: Diagnosis not present

## 2017-12-04 DIAGNOSIS — H25812 Combined forms of age-related cataract, left eye: Secondary | ICD-10-CM | POA: Diagnosis not present

## 2018-01-08 DIAGNOSIS — H25811 Combined forms of age-related cataract, right eye: Secondary | ICD-10-CM | POA: Diagnosis not present

## 2018-01-08 DIAGNOSIS — H2511 Age-related nuclear cataract, right eye: Secondary | ICD-10-CM | POA: Diagnosis not present

## 2018-01-14 DIAGNOSIS — I270 Primary pulmonary hypertension: Secondary | ICD-10-CM | POA: Diagnosis not present

## 2018-01-14 DIAGNOSIS — J449 Chronic obstructive pulmonary disease, unspecified: Secondary | ICD-10-CM | POA: Diagnosis not present

## 2018-01-14 DIAGNOSIS — M545 Low back pain: Secondary | ICD-10-CM | POA: Diagnosis not present

## 2018-01-15 DIAGNOSIS — I10 Essential (primary) hypertension: Secondary | ICD-10-CM | POA: Diagnosis not present

## 2018-01-15 DIAGNOSIS — M545 Low back pain: Secondary | ICD-10-CM | POA: Diagnosis not present

## 2018-01-15 DIAGNOSIS — M546 Pain in thoracic spine: Secondary | ICD-10-CM | POA: Diagnosis not present

## 2018-01-18 DIAGNOSIS — R531 Weakness: Secondary | ICD-10-CM | POA: Diagnosis not present

## 2018-02-02 ENCOUNTER — Telehealth: Payer: Self-pay | Admitting: Family Medicine

## 2018-02-02 NOTE — Telephone Encounter (Signed)
No answer, unable to leave message

## 2018-02-05 ENCOUNTER — Telehealth: Payer: Self-pay | Admitting: Family Medicine

## 2018-02-05 NOTE — Telephone Encounter (Signed)
Left message for patient to call for an appt.

## 2018-02-07 DIAGNOSIS — L603 Nail dystrophy: Secondary | ICD-10-CM | POA: Diagnosis not present

## 2018-02-07 DIAGNOSIS — I739 Peripheral vascular disease, unspecified: Secondary | ICD-10-CM | POA: Diagnosis not present

## 2018-02-07 DIAGNOSIS — B351 Tinea unguium: Secondary | ICD-10-CM | POA: Diagnosis not present

## 2018-02-14 DIAGNOSIS — G894 Chronic pain syndrome: Secondary | ICD-10-CM | POA: Diagnosis not present

## 2018-02-14 DIAGNOSIS — Z8781 Personal history of (healed) traumatic fracture: Secondary | ICD-10-CM | POA: Diagnosis not present

## 2018-02-14 DIAGNOSIS — R278 Other lack of coordination: Secondary | ICD-10-CM | POA: Diagnosis not present

## 2018-02-14 DIAGNOSIS — M545 Low back pain: Secondary | ICD-10-CM | POA: Diagnosis not present

## 2018-02-14 DIAGNOSIS — R2681 Unsteadiness on feet: Secondary | ICD-10-CM | POA: Diagnosis not present

## 2018-02-14 DIAGNOSIS — K7460 Unspecified cirrhosis of liver: Secondary | ICD-10-CM | POA: Diagnosis not present

## 2018-02-15 DIAGNOSIS — M545 Low back pain: Secondary | ICD-10-CM | POA: Diagnosis not present

## 2018-02-15 DIAGNOSIS — R278 Other lack of coordination: Secondary | ICD-10-CM | POA: Diagnosis not present

## 2018-02-15 DIAGNOSIS — Z8781 Personal history of (healed) traumatic fracture: Secondary | ICD-10-CM | POA: Diagnosis not present

## 2018-02-15 DIAGNOSIS — R2681 Unsteadiness on feet: Secondary | ICD-10-CM | POA: Diagnosis not present

## 2018-02-15 DIAGNOSIS — G894 Chronic pain syndrome: Secondary | ICD-10-CM | POA: Diagnosis not present

## 2018-02-15 DIAGNOSIS — K7460 Unspecified cirrhosis of liver: Secondary | ICD-10-CM | POA: Diagnosis not present

## 2018-02-16 DIAGNOSIS — R278 Other lack of coordination: Secondary | ICD-10-CM | POA: Diagnosis not present

## 2018-02-16 DIAGNOSIS — R2681 Unsteadiness on feet: Secondary | ICD-10-CM | POA: Diagnosis not present

## 2018-02-16 DIAGNOSIS — M545 Low back pain: Secondary | ICD-10-CM | POA: Diagnosis not present

## 2018-02-16 DIAGNOSIS — G894 Chronic pain syndrome: Secondary | ICD-10-CM | POA: Diagnosis not present

## 2018-02-16 DIAGNOSIS — K7460 Unspecified cirrhosis of liver: Secondary | ICD-10-CM | POA: Diagnosis not present

## 2018-02-16 DIAGNOSIS — Z8781 Personal history of (healed) traumatic fracture: Secondary | ICD-10-CM | POA: Diagnosis not present

## 2018-02-19 DIAGNOSIS — R278 Other lack of coordination: Secondary | ICD-10-CM | POA: Diagnosis not present

## 2018-02-19 DIAGNOSIS — R2681 Unsteadiness on feet: Secondary | ICD-10-CM | POA: Diagnosis not present

## 2018-02-19 DIAGNOSIS — K7460 Unspecified cirrhosis of liver: Secondary | ICD-10-CM | POA: Diagnosis not present

## 2018-02-19 DIAGNOSIS — Z8781 Personal history of (healed) traumatic fracture: Secondary | ICD-10-CM | POA: Diagnosis not present

## 2018-02-19 DIAGNOSIS — G894 Chronic pain syndrome: Secondary | ICD-10-CM | POA: Diagnosis not present

## 2018-02-19 DIAGNOSIS — M545 Low back pain: Secondary | ICD-10-CM | POA: Diagnosis not present

## 2018-02-20 ENCOUNTER — Telehealth: Payer: Self-pay | Admitting: Family Medicine

## 2018-02-20 DIAGNOSIS — K7460 Unspecified cirrhosis of liver: Secondary | ICD-10-CM | POA: Diagnosis not present

## 2018-02-20 DIAGNOSIS — Z8781 Personal history of (healed) traumatic fracture: Secondary | ICD-10-CM | POA: Diagnosis not present

## 2018-02-20 DIAGNOSIS — R278 Other lack of coordination: Secondary | ICD-10-CM | POA: Diagnosis not present

## 2018-02-20 DIAGNOSIS — G894 Chronic pain syndrome: Secondary | ICD-10-CM | POA: Diagnosis not present

## 2018-02-20 DIAGNOSIS — M545 Low back pain: Secondary | ICD-10-CM | POA: Diagnosis not present

## 2018-02-20 DIAGNOSIS — R2681 Unsteadiness on feet: Secondary | ICD-10-CM | POA: Diagnosis not present

## 2018-02-20 NOTE — Telephone Encounter (Signed)
No answer, unable to leave a message.

## 2018-02-21 DIAGNOSIS — R2681 Unsteadiness on feet: Secondary | ICD-10-CM | POA: Diagnosis not present

## 2018-02-21 DIAGNOSIS — G894 Chronic pain syndrome: Secondary | ICD-10-CM | POA: Diagnosis not present

## 2018-02-21 DIAGNOSIS — Z8781 Personal history of (healed) traumatic fracture: Secondary | ICD-10-CM | POA: Diagnosis not present

## 2018-02-21 DIAGNOSIS — M545 Low back pain: Secondary | ICD-10-CM | POA: Diagnosis not present

## 2018-02-21 DIAGNOSIS — K7460 Unspecified cirrhosis of liver: Secondary | ICD-10-CM | POA: Diagnosis not present

## 2018-02-21 DIAGNOSIS — R278 Other lack of coordination: Secondary | ICD-10-CM | POA: Diagnosis not present

## 2018-02-22 DIAGNOSIS — R278 Other lack of coordination: Secondary | ICD-10-CM | POA: Diagnosis not present

## 2018-02-22 DIAGNOSIS — Z8781 Personal history of (healed) traumatic fracture: Secondary | ICD-10-CM | POA: Diagnosis not present

## 2018-02-22 DIAGNOSIS — K7460 Unspecified cirrhosis of liver: Secondary | ICD-10-CM | POA: Diagnosis not present

## 2018-02-22 DIAGNOSIS — R2681 Unsteadiness on feet: Secondary | ICD-10-CM | POA: Diagnosis not present

## 2018-02-22 DIAGNOSIS — G894 Chronic pain syndrome: Secondary | ICD-10-CM | POA: Diagnosis not present

## 2018-02-22 DIAGNOSIS — M545 Low back pain: Secondary | ICD-10-CM | POA: Diagnosis not present

## 2018-02-24 DIAGNOSIS — G894 Chronic pain syndrome: Secondary | ICD-10-CM | POA: Diagnosis not present

## 2018-02-24 DIAGNOSIS — R278 Other lack of coordination: Secondary | ICD-10-CM | POA: Diagnosis not present

## 2018-02-24 DIAGNOSIS — K7460 Unspecified cirrhosis of liver: Secondary | ICD-10-CM | POA: Diagnosis not present

## 2018-02-24 DIAGNOSIS — Z8781 Personal history of (healed) traumatic fracture: Secondary | ICD-10-CM | POA: Diagnosis not present

## 2018-02-24 DIAGNOSIS — R2681 Unsteadiness on feet: Secondary | ICD-10-CM | POA: Diagnosis not present

## 2018-02-24 DIAGNOSIS — M545 Low back pain: Secondary | ICD-10-CM | POA: Diagnosis not present

## 2018-02-26 ENCOUNTER — Ambulatory Visit (INDEPENDENT_AMBULATORY_CARE_PROVIDER_SITE_OTHER): Payer: Self-pay | Admitting: Orthopedic Surgery

## 2018-02-26 DIAGNOSIS — Z8781 Personal history of (healed) traumatic fracture: Secondary | ICD-10-CM | POA: Diagnosis not present

## 2018-02-26 DIAGNOSIS — R278 Other lack of coordination: Secondary | ICD-10-CM | POA: Diagnosis not present

## 2018-02-26 DIAGNOSIS — K7460 Unspecified cirrhosis of liver: Secondary | ICD-10-CM | POA: Diagnosis not present

## 2018-02-26 DIAGNOSIS — R2681 Unsteadiness on feet: Secondary | ICD-10-CM | POA: Diagnosis not present

## 2018-02-26 DIAGNOSIS — M545 Low back pain: Secondary | ICD-10-CM | POA: Diagnosis not present

## 2018-02-26 DIAGNOSIS — G894 Chronic pain syndrome: Secondary | ICD-10-CM | POA: Diagnosis not present

## 2018-02-27 DIAGNOSIS — R278 Other lack of coordination: Secondary | ICD-10-CM | POA: Diagnosis not present

## 2018-02-27 DIAGNOSIS — Z8781 Personal history of (healed) traumatic fracture: Secondary | ICD-10-CM | POA: Diagnosis not present

## 2018-02-27 DIAGNOSIS — G894 Chronic pain syndrome: Secondary | ICD-10-CM | POA: Diagnosis not present

## 2018-02-27 DIAGNOSIS — M545 Low back pain: Secondary | ICD-10-CM | POA: Diagnosis not present

## 2018-02-27 DIAGNOSIS — R2681 Unsteadiness on feet: Secondary | ICD-10-CM | POA: Diagnosis not present

## 2018-02-27 DIAGNOSIS — K7460 Unspecified cirrhosis of liver: Secondary | ICD-10-CM | POA: Diagnosis not present

## 2018-02-28 DIAGNOSIS — R278 Other lack of coordination: Secondary | ICD-10-CM | POA: Diagnosis not present

## 2018-02-28 DIAGNOSIS — K7460 Unspecified cirrhosis of liver: Secondary | ICD-10-CM | POA: Diagnosis not present

## 2018-02-28 DIAGNOSIS — Z8781 Personal history of (healed) traumatic fracture: Secondary | ICD-10-CM | POA: Diagnosis not present

## 2018-02-28 DIAGNOSIS — R2681 Unsteadiness on feet: Secondary | ICD-10-CM | POA: Diagnosis not present

## 2018-02-28 DIAGNOSIS — G894 Chronic pain syndrome: Secondary | ICD-10-CM | POA: Diagnosis not present

## 2018-02-28 DIAGNOSIS — M545 Low back pain: Secondary | ICD-10-CM | POA: Diagnosis not present

## 2018-03-01 DIAGNOSIS — M545 Low back pain: Secondary | ICD-10-CM | POA: Diagnosis not present

## 2018-03-01 DIAGNOSIS — R2681 Unsteadiness on feet: Secondary | ICD-10-CM | POA: Diagnosis not present

## 2018-03-01 DIAGNOSIS — R278 Other lack of coordination: Secondary | ICD-10-CM | POA: Diagnosis not present

## 2018-03-01 DIAGNOSIS — G894 Chronic pain syndrome: Secondary | ICD-10-CM | POA: Diagnosis not present

## 2018-03-01 DIAGNOSIS — Z8781 Personal history of (healed) traumatic fracture: Secondary | ICD-10-CM | POA: Diagnosis not present

## 2018-03-01 DIAGNOSIS — K7460 Unspecified cirrhosis of liver: Secondary | ICD-10-CM | POA: Diagnosis not present

## 2018-03-02 DIAGNOSIS — G894 Chronic pain syndrome: Secondary | ICD-10-CM | POA: Diagnosis not present

## 2018-03-02 DIAGNOSIS — R278 Other lack of coordination: Secondary | ICD-10-CM | POA: Diagnosis not present

## 2018-03-02 DIAGNOSIS — Z8781 Personal history of (healed) traumatic fracture: Secondary | ICD-10-CM | POA: Diagnosis not present

## 2018-03-02 DIAGNOSIS — R2681 Unsteadiness on feet: Secondary | ICD-10-CM | POA: Diagnosis not present

## 2018-03-02 DIAGNOSIS — K7460 Unspecified cirrhosis of liver: Secondary | ICD-10-CM | POA: Diagnosis not present

## 2018-03-02 DIAGNOSIS — M545 Low back pain: Secondary | ICD-10-CM | POA: Diagnosis not present

## 2018-03-05 DIAGNOSIS — K7460 Unspecified cirrhosis of liver: Secondary | ICD-10-CM | POA: Diagnosis not present

## 2018-03-05 DIAGNOSIS — M545 Low back pain: Secondary | ICD-10-CM | POA: Diagnosis not present

## 2018-03-05 DIAGNOSIS — G894 Chronic pain syndrome: Secondary | ICD-10-CM | POA: Diagnosis not present

## 2018-03-05 DIAGNOSIS — Z8781 Personal history of (healed) traumatic fracture: Secondary | ICD-10-CM | POA: Diagnosis not present

## 2018-03-05 DIAGNOSIS — R278 Other lack of coordination: Secondary | ICD-10-CM | POA: Diagnosis not present

## 2018-03-05 DIAGNOSIS — R2681 Unsteadiness on feet: Secondary | ICD-10-CM | POA: Diagnosis not present

## 2018-03-06 DIAGNOSIS — K7460 Unspecified cirrhosis of liver: Secondary | ICD-10-CM | POA: Diagnosis not present

## 2018-03-06 DIAGNOSIS — G894 Chronic pain syndrome: Secondary | ICD-10-CM | POA: Diagnosis not present

## 2018-03-06 DIAGNOSIS — M545 Low back pain: Secondary | ICD-10-CM | POA: Diagnosis not present

## 2018-03-06 DIAGNOSIS — Z8781 Personal history of (healed) traumatic fracture: Secondary | ICD-10-CM | POA: Diagnosis not present

## 2018-03-06 DIAGNOSIS — R278 Other lack of coordination: Secondary | ICD-10-CM | POA: Diagnosis not present

## 2018-03-06 DIAGNOSIS — R2681 Unsteadiness on feet: Secondary | ICD-10-CM | POA: Diagnosis not present

## 2018-03-07 DIAGNOSIS — M545 Low back pain: Secondary | ICD-10-CM | POA: Diagnosis not present

## 2018-03-07 DIAGNOSIS — Z8781 Personal history of (healed) traumatic fracture: Secondary | ICD-10-CM | POA: Diagnosis not present

## 2018-03-07 DIAGNOSIS — G894 Chronic pain syndrome: Secondary | ICD-10-CM | POA: Diagnosis not present

## 2018-03-07 DIAGNOSIS — R278 Other lack of coordination: Secondary | ICD-10-CM | POA: Diagnosis not present

## 2018-03-07 DIAGNOSIS — K7460 Unspecified cirrhosis of liver: Secondary | ICD-10-CM | POA: Diagnosis not present

## 2018-03-07 DIAGNOSIS — R2681 Unsteadiness on feet: Secondary | ICD-10-CM | POA: Diagnosis not present

## 2018-03-08 DIAGNOSIS — G894 Chronic pain syndrome: Secondary | ICD-10-CM | POA: Diagnosis not present

## 2018-03-08 DIAGNOSIS — M545 Low back pain: Secondary | ICD-10-CM | POA: Diagnosis not present

## 2018-03-08 DIAGNOSIS — R278 Other lack of coordination: Secondary | ICD-10-CM | POA: Diagnosis not present

## 2018-03-08 DIAGNOSIS — R2681 Unsteadiness on feet: Secondary | ICD-10-CM | POA: Diagnosis not present

## 2018-03-08 DIAGNOSIS — K7460 Unspecified cirrhosis of liver: Secondary | ICD-10-CM | POA: Diagnosis not present

## 2018-03-08 DIAGNOSIS — Z8781 Personal history of (healed) traumatic fracture: Secondary | ICD-10-CM | POA: Diagnosis not present

## 2018-03-09 DIAGNOSIS — M545 Low back pain: Secondary | ICD-10-CM | POA: Diagnosis not present

## 2018-03-09 DIAGNOSIS — G894 Chronic pain syndrome: Secondary | ICD-10-CM | POA: Diagnosis not present

## 2018-03-09 DIAGNOSIS — R278 Other lack of coordination: Secondary | ICD-10-CM | POA: Diagnosis not present

## 2018-03-09 DIAGNOSIS — R2681 Unsteadiness on feet: Secondary | ICD-10-CM | POA: Diagnosis not present

## 2018-03-09 DIAGNOSIS — K7460 Unspecified cirrhosis of liver: Secondary | ICD-10-CM | POA: Diagnosis not present

## 2018-03-09 DIAGNOSIS — Z8781 Personal history of (healed) traumatic fracture: Secondary | ICD-10-CM | POA: Diagnosis not present

## 2018-03-11 IMAGING — NM NM PULMONARY VENT & PERF
16 series · 16 of 16 positions shown · non-contrast
Comparison: Chest radiograph January 10, 2017

CLINICAL DATA: Shortness of breath and chest pain

EXAM:
NUCLEAR MEDICINE VENTILATION - PERFUSION LUNG SCAN
VIEWS:
Anterior, posterior, left lateral, right lateral, RPO, LPO, RAO, LAO
-ventilation and profusion
RADIOPHARMACEUTICALS:  30.4 mCi 4echnetium-HHm DTPA aerosol
inhalation and 4.2 mCi 4echnetium-HHm MAA IV

[Series 1: ant/post vent · 4.14mm/px · 1 of 1 slices shown (1 of 2)]
[im 1/1]
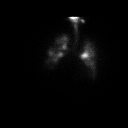

[Series 1: ant/post vent · 4.14mm/px · 1 of 1 slices shown (2 of 2)]
[im 1/1]
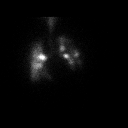

[Series 2: lao/rpo vent · 4.14mm/px · 1 of 1 slices shown (1 of 2)]
[im 1/1]
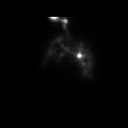

[Series 2: lao/rpo vent · 4.14mm/px · 1 of 1 slices shown (2 of 2)]
[im 1/1]
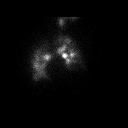

[Series 3: lpo/rao vent · 4.14mm/px · 1 of 1 slices shown (1 of 2)]
[im 1/1]
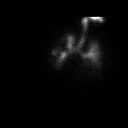

[Series 3: lpo/rao vent · 4.14mm/px · 1 of 1 slices shown (2 of 2)]
[im 1/1]
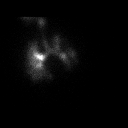

[Series 4: lt lat/rt lat vent · 4.14mm/px · 1 of 1 slices shown (1 of 2)]
[im 1/1]
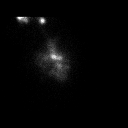

[Series 4: lt lat/rt lat vent · 4.14mm/px · 1 of 1 slices shown (2 of 2)]
[im 1/1]
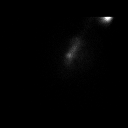

[Series 5: lt lat/rt lat perf · 4.14mm/px · 1 of 1 slices shown (1 of 2)]
[im 1/1]
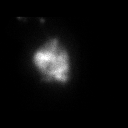

[Series 5: lt lat/rt lat perf · 4.14mm/px · 1 of 1 slices shown (2 of 2)]
[im 1/1]
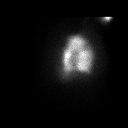

[Series 6: lpo/rao perf · 4.14mm/px · 1 of 1 slices shown (1 of 2)]
[im 1/1]
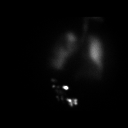

[Series 6: lpo/rao perf · 4.14mm/px · 1 of 1 slices shown (2 of 2)]
[im 1/1]
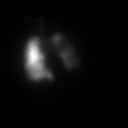

[Series 7: ant/post perf · 4.14mm/px · 1 of 1 slices shown (1 of 2)]
[im 1/1]
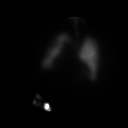

[Series 7: ant/post perf · 4.14mm/px · 1 of 1 slices shown (2 of 2)]
[im 1/1]
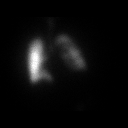

[Series 8: lao/rpo perf · 4.14mm/px · 1 of 1 slices shown (1 of 2)]
[im 1/1]
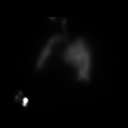

[Series 8: lao/rpo perf · 4.14mm/px · 1 of 1 slices shown (2 of 2)]
[im 1/1]
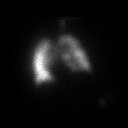

[16 of 16 positions shown; findings below may reference images not displayed]

FINDINGS: Ventilation: There is relative decreased radiotracer uptake in the
posterior segment of the right upper lobe and in the right base
regions. Chest radiograph shows infiltrate in the right base and
atelectatic change in the right upper lobe. Elsewhere, the
distribution of uptake on the ventilation study appears
unremarkable. There is cardiomegaly.

Perfusion: For fusion is appears essentially normal in the right
base in the area of relative decreased uptake in this area on the
ventilation study. There is a matching perfusion defect with respect
to the ventilation study in the posterior segment of the right upper
lobe. There is no appreciable perfusion defect without corresponding
ventilation defect. Cardiomegaly noted.
IMPRESSION: Matching ventilation and perfusion defects in the posterior segment
right upper lobe. No other appreciable perfusion defect. Relative
ventilation defect in the right base in an area of infiltrate seen
on chest radiograph. Cardiomegaly noted.

Based on PIOPED II criteria, this study constitutes a low
probability of pulmonary embolus.

## 2018-03-12 IMAGING — DX DG CHEST 1V PORT
1 series · 1 of 1 positions shown · non-contrast
Comparison: Portable chest x-ray earlier today 6679 hours, chest
x-rays yesterday, 01/06/2017 and earlier.

CLINICAL DATA: 81-year-old with acute onset shortness of breath.
Follow-up CHF, effusions and right basilar atelectasis.

EXAM:
PORTABLE CHEST 1 VIEW 3628 hours:

[chest ap]
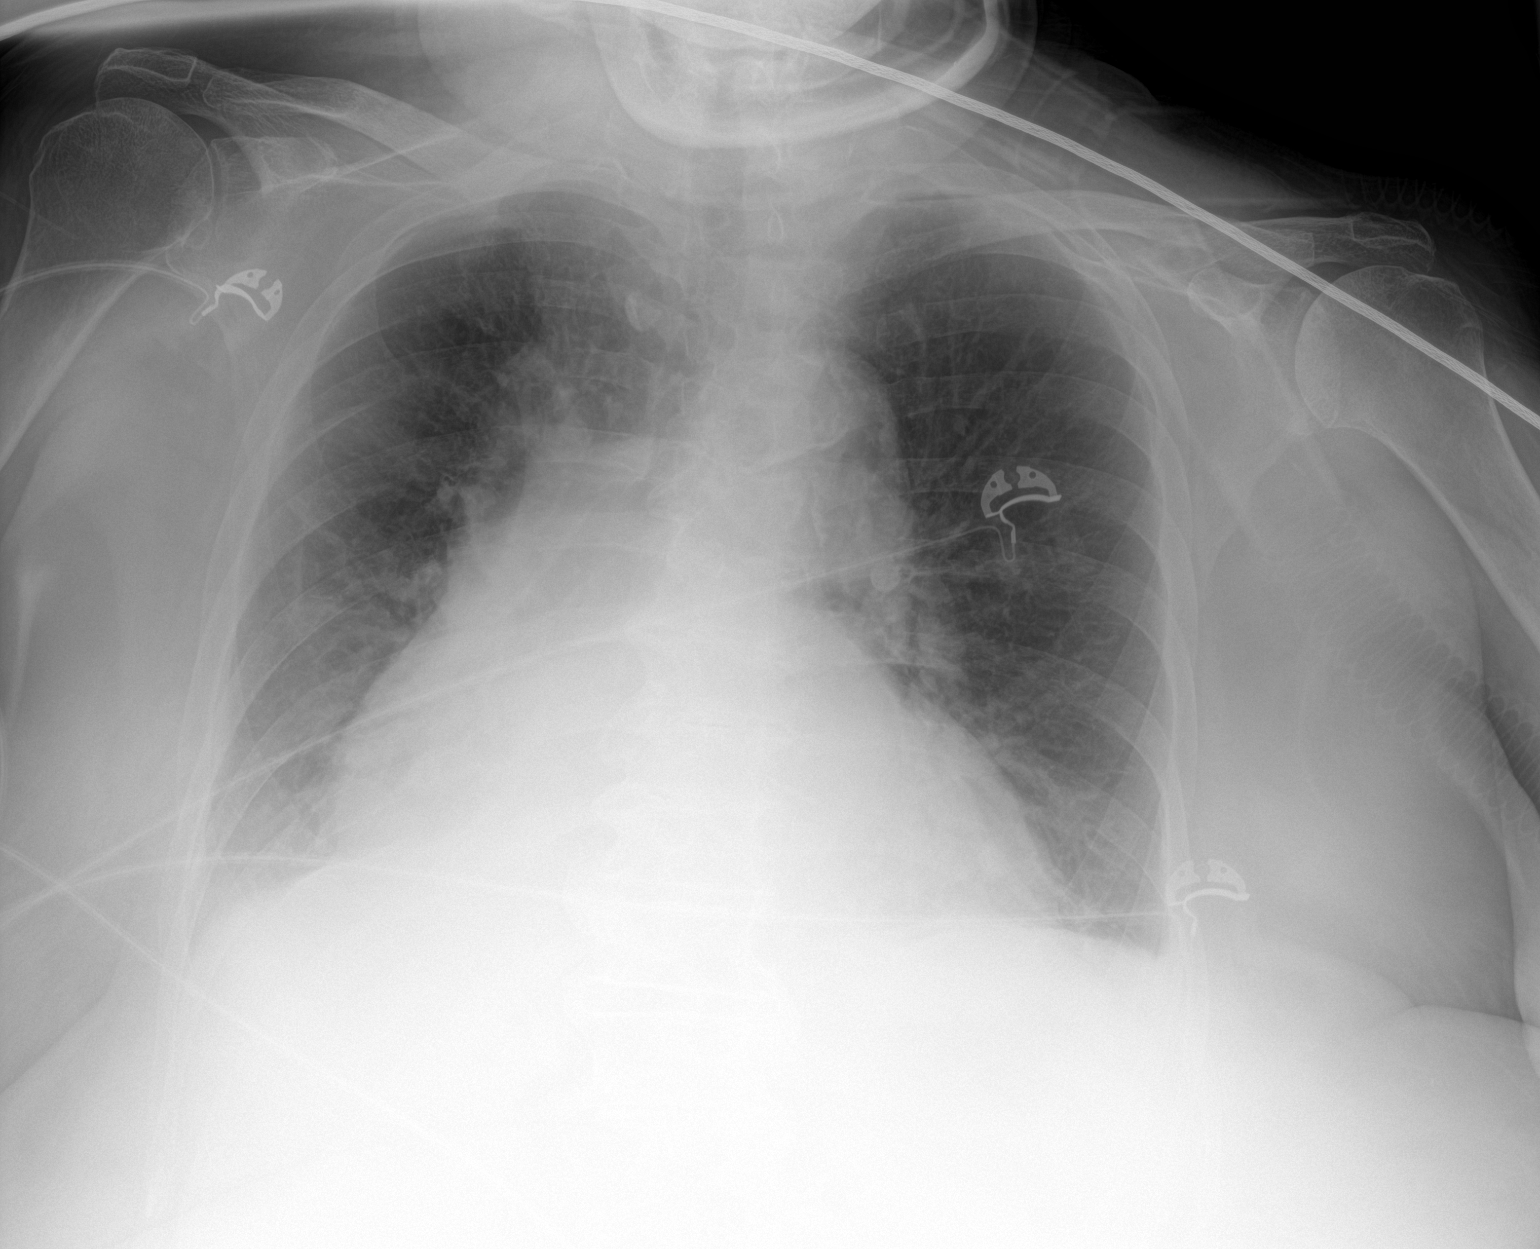

[1 of 1 positions shown; findings below may reference images not displayed]

FINDINGS: Cardiac silhouette markedly enlarged, unchanged. Thoracic aorta
atherosclerotic, unchanged. Hilar and mediastinal contours otherwise
unremarkable. Interval resolution of the interstitial pulmonary
edema since the examination earlier today. Persistent small
bilateral pleural effusions, right greater than left. Mild passive
atelectasis in the right lower lobe with improved aeration since
earlier in the day. No new abnormalities.
IMPRESSION: 1. Resolution of interstitial pulmonary edema.
2. Persistent small bilateral pleural effusions, right greater than
left.
3. Mild passive atelectasis in the right lower lobe with improved
aeration since earlier in the day.
4. No new abnormalities.

## 2018-04-06 DIAGNOSIS — D649 Anemia, unspecified: Secondary | ICD-10-CM | POA: Diagnosis not present

## 2018-04-06 DIAGNOSIS — R0602 Shortness of breath: Secondary | ICD-10-CM | POA: Diagnosis not present

## 2018-04-06 DIAGNOSIS — I5033 Acute on chronic diastolic (congestive) heart failure: Secondary | ICD-10-CM | POA: Diagnosis not present

## 2018-04-09 DIAGNOSIS — M25562 Pain in left knee: Secondary | ICD-10-CM | POA: Diagnosis not present

## 2018-04-10 DIAGNOSIS — D649 Anemia, unspecified: Secondary | ICD-10-CM | POA: Diagnosis not present

## 2018-04-19 ENCOUNTER — Telehealth: Payer: Self-pay | Admitting: Family Medicine

## 2018-04-19 NOTE — Telephone Encounter (Signed)
I left a message asking the pt to call and schedule appt. VDM (DD)

## 2018-04-23 DIAGNOSIS — D649 Anemia, unspecified: Secondary | ICD-10-CM | POA: Diagnosis not present

## 2018-04-23 DIAGNOSIS — Z5181 Encounter for therapeutic drug level monitoring: Secondary | ICD-10-CM | POA: Diagnosis not present

## 2018-04-28 DIAGNOSIS — T8130XA Disruption of wound, unspecified, initial encounter: Secondary | ICD-10-CM | POA: Diagnosis not present

## 2018-04-28 DIAGNOSIS — Z79899 Other long term (current) drug therapy: Secondary | ICD-10-CM | POA: Diagnosis not present

## 2018-05-09 DIAGNOSIS — L603 Nail dystrophy: Secondary | ICD-10-CM | POA: Diagnosis not present

## 2018-05-09 DIAGNOSIS — Z515 Encounter for palliative care: Secondary | ICD-10-CM | POA: Diagnosis not present

## 2018-05-09 DIAGNOSIS — I739 Peripheral vascular disease, unspecified: Secondary | ICD-10-CM | POA: Diagnosis not present

## 2018-05-09 DIAGNOSIS — R531 Weakness: Secondary | ICD-10-CM | POA: Diagnosis not present

## 2018-05-09 DIAGNOSIS — D6832 Hemorrhagic disorder due to extrinsic circulating anticoagulants: Secondary | ICD-10-CM | POA: Diagnosis not present

## 2018-05-10 ENCOUNTER — Non-Acute Institutional Stay: Payer: Medicare Other | Admitting: Hospice and Palliative Medicine

## 2018-05-10 DIAGNOSIS — Z515 Encounter for palliative care: Secondary | ICD-10-CM

## 2018-05-10 NOTE — Progress Notes (Signed)
Visit attempted but patient was in an activity. I saw her from the door of the activity room and she looked comfortable and engaged in crafts. I spoke with nursing staff who had no acute concerns. They felt patient has been doing well.

## 2018-05-15 NOTE — Telephone Encounter (Signed)
Noted, thank you.  -MM

## 2018-05-15 NOTE — Telephone Encounter (Signed)
Patient's daughter called and says that the patient is currently in a skilled nursing facility. She will not be able to come in for AWV.

## 2018-05-20 DIAGNOSIS — K828 Other specified diseases of gallbladder: Secondary | ICD-10-CM | POA: Diagnosis not present

## 2018-05-21 DIAGNOSIS — K828 Other specified diseases of gallbladder: Secondary | ICD-10-CM | POA: Diagnosis not present

## 2018-05-22 ENCOUNTER — Non-Acute Institutional Stay: Payer: Medicare Other | Admitting: Hospice and Palliative Medicine

## 2018-05-22 DIAGNOSIS — K828 Other specified diseases of gallbladder: Secondary | ICD-10-CM | POA: Diagnosis not present

## 2018-05-22 DIAGNOSIS — Z515 Encounter for palliative care: Secondary | ICD-10-CM

## 2018-05-23 DIAGNOSIS — I10 Essential (primary) hypertension: Secondary | ICD-10-CM | POA: Diagnosis not present

## 2018-05-23 DIAGNOSIS — N390 Urinary tract infection, site not specified: Secondary | ICD-10-CM | POA: Diagnosis not present

## 2018-05-23 DIAGNOSIS — R410 Disorientation, unspecified: Secondary | ICD-10-CM | POA: Diagnosis not present

## 2018-05-23 DIAGNOSIS — J449 Chronic obstructive pulmonary disease, unspecified: Secondary | ICD-10-CM | POA: Diagnosis not present

## 2018-05-23 DIAGNOSIS — R5382 Chronic fatigue, unspecified: Secondary | ICD-10-CM | POA: Diagnosis not present

## 2018-05-23 DIAGNOSIS — R5383 Other fatigue: Secondary | ICD-10-CM | POA: Diagnosis not present

## 2018-05-23 DIAGNOSIS — R319 Hematuria, unspecified: Secondary | ICD-10-CM | POA: Diagnosis not present

## 2018-05-23 NOTE — Progress Notes (Signed)
PALLIATIVE CARE CONSULT VISIT   PATIENT NAME: Carolyn Hendricks DOB: 1936-08-19 MRN: [id number]  PRIMARY CARE PROVIDER:   Birdie Sons, MD  REFERRING PROVIDER:  Birdie Sons, MD [address] [place], [place] [address]  RESPONSIBLE PARTY:   Self, daughter Hilda Blades - [phone]  ASSESSMENT:     I met with patient, daughter, and brother. Patient reports a week of significant functional decline. She has not been able to participate in activities and has fallen twice. She is now in the bed and daughter has had to feed her. This reflects a dramatic decline from her baseline, where she typically engages frequently in activities at the facility. Reportedly, oral intake has also declined. Etiology of decline is unclear. Patient has no focal symptoms other than weakness. Breathing is stable and she has no fever chills, dysuria, or edema. We discussed workup and patient is not interested in that. She also does not want to return to the hospital. Instead, she is interested in hospice involvement. Case discussed with SW.   RECOMMENDATIONS and PLAN:  1. Hospice referral  I spent 30 minutes providing this consultation,  from 1300 to 1330. More than 50% of the time in this consultation was spent coordinating communication.   HISTORY OF PRESENT ILLNESS:  Carolyn Hendricks is an 82 yo woman with multiple medical problems including dCHF, COPD, pulmonary HTN, who was last hospitalized 01/06/17 to 01/17/17 with acute on chronic respiratory failure requiring BIPAP. Workup including cardiac catheterization revealed severe R. heart failure with recommendations for palliative care. There was consideration for hospice but patient was ultimately sent to rehab. She has been relatively stable at the SNF until recent decline with weakness and reduced oral intake. I received a call today from the SW asking me to make a visit.   CODE STATUS: DNR  PPS: 30% HOSPICE ELIGIBILITY/DIAGNOSIS: YES  PAST MEDICAL HISTORY:   Past Medical History:  Diagnosis Date  . Arthritis   . Chest pain   . CHF (congestive heart failure) ([place])   . Chronic kidney disease   . Diastolic dysfunction   . Dizziness   . Head ache   . Heart murmur   . High blood pressure   . History of MRSA infection   . Renal insufficiency   . Shortness of breath     SOCIAL HX:  Social History   Tobacco Use  . Smoking status: Former Smoker    Packs/day: 1.00    Years: 26.00    Pack years: 26.00    Types: Cigarettes    Last attempt to quit: 10/11/2011    Years since quitting: 6.6  . Smokeless tobacco: Never Used  . Tobacco comment: Patient has smoked for 26 years; has quit but now started back 0.25 ppw.  Substance Use Topics  . Alcohol use: No    Alcohol/week: 0.0 standard drinks    ALLERGIES:  Allergies  Allergen Reactions  . Atorvastatin Other (See Comments)    Other reaction(s): Muscle Pain  . Crestor [Rosuvastatin Calcium] Other (See Comments)    Other reaction(s): Muscle Pain  . Tape Other (See Comments)    Makes a bruise on my skin. Paper tape is ok to use.  . Pravastatin Sodium Other (See Comments)    Muscle pain     PERTINENT MEDICATIONS:  Outpatient Encounter Medications as of 05/22/2018  Medication Sig  . ADVAIR DISKUS 250-50 MCG/DOSE AEPB INHALE ONE (1) PUFF EVERY 12 HOURS AS DIRECTED. RINSE MOUTH AFTER [facility]  USE.  . albuterol (PROVENTIL HFA;VENTOLIN HFA) 108 (90 Base) MCG/ACT inhaler Inhale 1 puff into the lungs every 6 (six) hours as needed for wheezing or shortness of breath.  Marland Kitchen albuterol (PROVENTIL) (2.5 MG/3ML) 0.083% nebulizer solution Take 3 mLs (2.5 mg total) by nebulization every 4 (four) hours as needed for wheezing or shortness of breath. (Patient not taking: Reported on 01/06/2017)  . aspirin 81 MG tablet Take 81 mg by mouth every morning. Reported on 01/07/2016  . Cholecalciferol (VITAMIN D-3) 5000 units TABS Take 1 tablet by mouth daily.  . cholestyramine (QUESTRAN) 4 g packet Take 1 packet (4 g  total) by mouth daily.  . clopidogrel (PLAVIX) 75 MG tablet TAKE ONE TABLET BY MOUTH EVERY MORNING  . ferrous sulfate 325 (65 FE) MG tablet Take 1 tablet (325 mg total) by mouth daily.  . fluticasone (FLONASE) 50 MCG/ACT nasal spray INHALE ONE TO TWO SPRAY IN EACH NOSTRIL DAILY AS NEEDED  . furosemide (LASIX) 20 MG tablet Take 3 tablets (60 mg total) by mouth 2 (two) times daily.  Marland Kitchen HYDROcodone-acetaminophen (NORCO) 10-325 MG tablet Take 1 tablet by mouth every 4 (four) hours as needed for moderate pain or severe pain.  Marland Kitchen ipratropium-albuterol (DUONEB) 0.5-2.5 (3) MG/3ML SOLN Take 3 mLs by nebulization 3 (three) times daily.  . isosorbide mononitrate (IMDUR) 30 MG 24 hr tablet TAKE 1 TABLET BY MOUTH DAILY FOR HEART  . levothyroxine (SYNTHROID, LEVOTHROID) 75 MCG tablet TAKE 1 TABLET BY MOUTH DAILY (FOR THYROID) (Patient taking differently: Take 75 mcg by mouth daily)  . LORazepam (ATIVAN) 0.5 MG tablet Take 0.5 tablets (0.25 mg total) by mouth every 12 (twelve) hours as needed for anxiety. (Patient not taking: Reported on 01/06/2017)  . metoprolol (LOPRESSOR) 50 MG tablet Take 1 tablet (50 mg total) by mouth 2 (two) times daily.  . mupirocin ointment (BACTROBAN) 2 % APPLY TO AFFECTED AREA(S) TWO TIMES PER DAY AS NEEDED (Patient not taking: Reported on 01/06/2017)  . nitroGLYCERIN (NITROSTAT) 0.4 MG SL tablet Place 1 tablet (0.4 mg total) under the tongue every 5 (five) minutes as needed for chest pain.  Marland Kitchen ondansetron (ZOFRAN ODT) 4 MG disintegrating tablet Take 1 tablet (4 mg total) by mouth every 8 (eight) hours as needed for nausea or vomiting.  . pantoprazole (PROTONIX) 40 MG tablet Take 1 tablet (40 mg total) by mouth daily.  . potassium chloride SA (K-DUR,KLOR-CON) 20 MEQ tablet Take 2 tablets (40 mEq total) by mouth daily.  . sertraline (ZOLOFT) 50 MG tablet Take 25 mg by mouth daily for mood (Patient taking differently: Take 50 mg by mouth at bedtime. )  . simvastatin (ZOCOR) 20 MG tablet Take  20 mg by mouth daily  . sodium bicarbonate 650 MG tablet Take 650 mg by mouth 3 (three) times daily.  . traZODone (DESYREL) 100 MG tablet TAKE ONE-HALF OR ONE TABLET BY MOUTH AT BEDTIME AS NEEDED FOR SLEEP.   No facility-administered encounter medications on file as of 05/22/2018.     PHYSICAL EXAM:   General: NAD, frail appearing, in bed Cardiovascular: regular rate and rhythm Pulmonary: poor air movement without wheeze, on O2 Abdomen: soft, nontender, + bowel sounds GU: no suprapubic tenderness Extremities: no edema, no joint deformities Skin: no rashes Neurological: Weakness but otherwise nonfocal  Irean Hong, NP

## 2018-05-25 DIAGNOSIS — J9620 Acute and chronic respiratory failure, unspecified whether with hypoxia or hypercapnia: Secondary | ICD-10-CM | POA: Diagnosis not present

## 2018-05-25 DIAGNOSIS — J449 Chronic obstructive pulmonary disease, unspecified: Secondary | ICD-10-CM | POA: Diagnosis not present

## 2018-05-25 DIAGNOSIS — I10 Essential (primary) hypertension: Secondary | ICD-10-CM | POA: Diagnosis not present

## 2018-05-25 DIAGNOSIS — N390 Urinary tract infection, site not specified: Secondary | ICD-10-CM | POA: Diagnosis not present

## 2018-05-25 DIAGNOSIS — E039 Hypothyroidism, unspecified: Secondary | ICD-10-CM | POA: Diagnosis not present

## 2018-05-25 DIAGNOSIS — F339 Major depressive disorder, recurrent, unspecified: Secondary | ICD-10-CM | POA: Diagnosis not present

## 2018-05-25 DIAGNOSIS — I5040 Unspecified combined systolic (congestive) and diastolic (congestive) heart failure: Secondary | ICD-10-CM | POA: Diagnosis not present

## 2018-05-25 DIAGNOSIS — I25119 Atherosclerotic heart disease of native coronary artery with unspecified angina pectoris: Secondary | ICD-10-CM | POA: Diagnosis not present

## 2018-05-25 DIAGNOSIS — K7460 Unspecified cirrhosis of liver: Secondary | ICD-10-CM | POA: Diagnosis not present

## 2018-05-25 DIAGNOSIS — I270 Primary pulmonary hypertension: Secondary | ICD-10-CM | POA: Diagnosis not present

## 2018-05-25 DIAGNOSIS — N183 Chronic kidney disease, stage 3 (moderate): Secondary | ICD-10-CM | POA: Diagnosis not present

## 2018-05-25 DIAGNOSIS — E785 Hyperlipidemia, unspecified: Secondary | ICD-10-CM | POA: Diagnosis not present

## 2018-05-28 DIAGNOSIS — J449 Chronic obstructive pulmonary disease, unspecified: Secondary | ICD-10-CM | POA: Diagnosis not present

## 2018-05-28 DIAGNOSIS — J9620 Acute and chronic respiratory failure, unspecified whether with hypoxia or hypercapnia: Secondary | ICD-10-CM | POA: Diagnosis not present

## 2018-05-28 DIAGNOSIS — I25119 Atherosclerotic heart disease of native coronary artery with unspecified angina pectoris: Secondary | ICD-10-CM | POA: Diagnosis not present

## 2018-05-28 DIAGNOSIS — I270 Primary pulmonary hypertension: Secondary | ICD-10-CM | POA: Diagnosis not present

## 2018-05-28 DIAGNOSIS — K7460 Unspecified cirrhosis of liver: Secondary | ICD-10-CM | POA: Diagnosis not present

## 2018-05-28 DIAGNOSIS — I5040 Unspecified combined systolic (congestive) and diastolic (congestive) heart failure: Secondary | ICD-10-CM | POA: Diagnosis not present

## 2018-06-01 DIAGNOSIS — J449 Chronic obstructive pulmonary disease, unspecified: Secondary | ICD-10-CM | POA: Diagnosis not present

## 2018-06-01 DIAGNOSIS — J9620 Acute and chronic respiratory failure, unspecified whether with hypoxia or hypercapnia: Secondary | ICD-10-CM | POA: Diagnosis not present

## 2018-06-01 DIAGNOSIS — I5040 Unspecified combined systolic (congestive) and diastolic (congestive) heart failure: Secondary | ICD-10-CM | POA: Diagnosis not present

## 2018-06-01 DIAGNOSIS — I25119 Atherosclerotic heart disease of native coronary artery with unspecified angina pectoris: Secondary | ICD-10-CM | POA: Diagnosis not present

## 2018-06-01 DIAGNOSIS — I270 Primary pulmonary hypertension: Secondary | ICD-10-CM | POA: Diagnosis not present

## 2018-06-01 DIAGNOSIS — K7460 Unspecified cirrhosis of liver: Secondary | ICD-10-CM | POA: Diagnosis not present

## 2018-06-05 DIAGNOSIS — I5040 Unspecified combined systolic (congestive) and diastolic (congestive) heart failure: Secondary | ICD-10-CM | POA: Diagnosis not present

## 2018-06-05 DIAGNOSIS — I270 Primary pulmonary hypertension: Secondary | ICD-10-CM | POA: Diagnosis not present

## 2018-06-05 DIAGNOSIS — I25119 Atherosclerotic heart disease of native coronary artery with unspecified angina pectoris: Secondary | ICD-10-CM | POA: Diagnosis not present

## 2018-06-05 DIAGNOSIS — J9620 Acute and chronic respiratory failure, unspecified whether with hypoxia or hypercapnia: Secondary | ICD-10-CM | POA: Diagnosis not present

## 2018-06-05 DIAGNOSIS — K7460 Unspecified cirrhosis of liver: Secondary | ICD-10-CM | POA: Diagnosis not present

## 2018-06-05 DIAGNOSIS — J449 Chronic obstructive pulmonary disease, unspecified: Secondary | ICD-10-CM | POA: Diagnosis not present

## 2018-06-08 DIAGNOSIS — J449 Chronic obstructive pulmonary disease, unspecified: Secondary | ICD-10-CM | POA: Diagnosis not present

## 2018-06-08 DIAGNOSIS — I5040 Unspecified combined systolic (congestive) and diastolic (congestive) heart failure: Secondary | ICD-10-CM | POA: Diagnosis not present

## 2018-06-08 DIAGNOSIS — I25119 Atherosclerotic heart disease of native coronary artery with unspecified angina pectoris: Secondary | ICD-10-CM | POA: Diagnosis not present

## 2018-06-08 DIAGNOSIS — K7460 Unspecified cirrhosis of liver: Secondary | ICD-10-CM | POA: Diagnosis not present

## 2018-06-08 DIAGNOSIS — I270 Primary pulmonary hypertension: Secondary | ICD-10-CM | POA: Diagnosis not present

## 2018-06-08 DIAGNOSIS — J9620 Acute and chronic respiratory failure, unspecified whether with hypoxia or hypercapnia: Secondary | ICD-10-CM | POA: Diagnosis not present

## 2018-06-09 DIAGNOSIS — J449 Chronic obstructive pulmonary disease, unspecified: Secondary | ICD-10-CM | POA: Diagnosis not present

## 2018-06-09 DIAGNOSIS — R1319 Other dysphagia: Secondary | ICD-10-CM | POA: Diagnosis not present

## 2018-06-10 DIAGNOSIS — J449 Chronic obstructive pulmonary disease, unspecified: Secondary | ICD-10-CM | POA: Diagnosis not present

## 2018-06-10 DIAGNOSIS — I25119 Atherosclerotic heart disease of native coronary artery with unspecified angina pectoris: Secondary | ICD-10-CM | POA: Diagnosis not present

## 2018-06-10 DIAGNOSIS — I5040 Unspecified combined systolic (congestive) and diastolic (congestive) heart failure: Secondary | ICD-10-CM | POA: Diagnosis not present

## 2018-06-10 DIAGNOSIS — F339 Major depressive disorder, recurrent, unspecified: Secondary | ICD-10-CM | POA: Diagnosis not present

## 2018-06-10 DIAGNOSIS — N390 Urinary tract infection, site not specified: Secondary | ICD-10-CM | POA: Diagnosis not present

## 2018-06-10 DIAGNOSIS — N183 Chronic kidney disease, stage 3 (moderate): Secondary | ICD-10-CM | POA: Diagnosis not present

## 2018-06-10 DIAGNOSIS — J9620 Acute and chronic respiratory failure, unspecified whether with hypoxia or hypercapnia: Secondary | ICD-10-CM | POA: Diagnosis not present

## 2018-06-10 DIAGNOSIS — K7460 Unspecified cirrhosis of liver: Secondary | ICD-10-CM | POA: Diagnosis not present

## 2018-06-10 DIAGNOSIS — E785 Hyperlipidemia, unspecified: Secondary | ICD-10-CM | POA: Diagnosis not present

## 2018-06-10 DIAGNOSIS — I270 Primary pulmonary hypertension: Secondary | ICD-10-CM | POA: Diagnosis not present

## 2018-06-10 DIAGNOSIS — I10 Essential (primary) hypertension: Secondary | ICD-10-CM | POA: Diagnosis not present

## 2018-06-10 DIAGNOSIS — E039 Hypothyroidism, unspecified: Secondary | ICD-10-CM | POA: Diagnosis not present

## 2018-06-13 DIAGNOSIS — K7460 Unspecified cirrhosis of liver: Secondary | ICD-10-CM | POA: Diagnosis not present

## 2018-06-13 DIAGNOSIS — J9620 Acute and chronic respiratory failure, unspecified whether with hypoxia or hypercapnia: Secondary | ICD-10-CM | POA: Diagnosis not present

## 2018-06-13 DIAGNOSIS — I5040 Unspecified combined systolic (congestive) and diastolic (congestive) heart failure: Secondary | ICD-10-CM | POA: Diagnosis not present

## 2018-06-13 DIAGNOSIS — J449 Chronic obstructive pulmonary disease, unspecified: Secondary | ICD-10-CM | POA: Diagnosis not present

## 2018-06-13 DIAGNOSIS — I25119 Atherosclerotic heart disease of native coronary artery with unspecified angina pectoris: Secondary | ICD-10-CM | POA: Diagnosis not present

## 2018-06-13 DIAGNOSIS — I270 Primary pulmonary hypertension: Secondary | ICD-10-CM | POA: Diagnosis not present

## 2018-06-15 DIAGNOSIS — I25119 Atherosclerotic heart disease of native coronary artery with unspecified angina pectoris: Secondary | ICD-10-CM | POA: Diagnosis not present

## 2018-06-15 DIAGNOSIS — K7460 Unspecified cirrhosis of liver: Secondary | ICD-10-CM | POA: Diagnosis not present

## 2018-06-15 DIAGNOSIS — J9620 Acute and chronic respiratory failure, unspecified whether with hypoxia or hypercapnia: Secondary | ICD-10-CM | POA: Diagnosis not present

## 2018-06-15 DIAGNOSIS — I270 Primary pulmonary hypertension: Secondary | ICD-10-CM | POA: Diagnosis not present

## 2018-06-15 DIAGNOSIS — I5040 Unspecified combined systolic (congestive) and diastolic (congestive) heart failure: Secondary | ICD-10-CM | POA: Diagnosis not present

## 2018-06-15 DIAGNOSIS — J449 Chronic obstructive pulmonary disease, unspecified: Secondary | ICD-10-CM | POA: Diagnosis not present

## 2018-06-20 ENCOUNTER — Non-Acute Institutional Stay: Admitting: Hospice and Palliative Medicine

## 2018-06-20 DIAGNOSIS — K7460 Unspecified cirrhosis of liver: Secondary | ICD-10-CM | POA: Diagnosis not present

## 2018-06-20 DIAGNOSIS — Z515 Encounter for palliative care: Secondary | ICD-10-CM

## 2018-06-20 DIAGNOSIS — I270 Primary pulmonary hypertension: Secondary | ICD-10-CM | POA: Diagnosis not present

## 2018-06-20 DIAGNOSIS — J449 Chronic obstructive pulmonary disease, unspecified: Secondary | ICD-10-CM | POA: Diagnosis not present

## 2018-06-20 DIAGNOSIS — J9620 Acute and chronic respiratory failure, unspecified whether with hypoxia or hypercapnia: Secondary | ICD-10-CM | POA: Diagnosis not present

## 2018-06-20 DIAGNOSIS — I25119 Atherosclerotic heart disease of native coronary artery with unspecified angina pectoris: Secondary | ICD-10-CM | POA: Diagnosis not present

## 2018-06-20 DIAGNOSIS — I5040 Unspecified combined systolic (congestive) and diastolic (congestive) heart failure: Secondary | ICD-10-CM | POA: Diagnosis not present

## 2018-06-20 NOTE — Progress Notes (Signed)
Case discussed with SW and nursing staff. Patient was admitted to hospice care. Will discharge from palliative care.

## 2018-06-22 DIAGNOSIS — J449 Chronic obstructive pulmonary disease, unspecified: Secondary | ICD-10-CM | POA: Diagnosis not present

## 2018-06-22 DIAGNOSIS — J9620 Acute and chronic respiratory failure, unspecified whether with hypoxia or hypercapnia: Secondary | ICD-10-CM | POA: Diagnosis not present

## 2018-06-22 DIAGNOSIS — I25119 Atherosclerotic heart disease of native coronary artery with unspecified angina pectoris: Secondary | ICD-10-CM | POA: Diagnosis not present

## 2018-06-22 DIAGNOSIS — K7460 Unspecified cirrhosis of liver: Secondary | ICD-10-CM | POA: Diagnosis not present

## 2018-06-22 DIAGNOSIS — I270 Primary pulmonary hypertension: Secondary | ICD-10-CM | POA: Diagnosis not present

## 2018-06-22 DIAGNOSIS — I5040 Unspecified combined systolic (congestive) and diastolic (congestive) heart failure: Secondary | ICD-10-CM | POA: Diagnosis not present

## 2018-06-26 DIAGNOSIS — K7460 Unspecified cirrhosis of liver: Secondary | ICD-10-CM | POA: Diagnosis not present

## 2018-06-26 DIAGNOSIS — I270 Primary pulmonary hypertension: Secondary | ICD-10-CM | POA: Diagnosis not present

## 2018-06-26 DIAGNOSIS — I25119 Atherosclerotic heart disease of native coronary artery with unspecified angina pectoris: Secondary | ICD-10-CM | POA: Diagnosis not present

## 2018-06-26 DIAGNOSIS — J449 Chronic obstructive pulmonary disease, unspecified: Secondary | ICD-10-CM | POA: Diagnosis not present

## 2018-06-26 DIAGNOSIS — J9620 Acute and chronic respiratory failure, unspecified whether with hypoxia or hypercapnia: Secondary | ICD-10-CM | POA: Diagnosis not present

## 2018-06-26 DIAGNOSIS — I5040 Unspecified combined systolic (congestive) and diastolic (congestive) heart failure: Secondary | ICD-10-CM | POA: Diagnosis not present

## 2018-06-28 DIAGNOSIS — J449 Chronic obstructive pulmonary disease, unspecified: Secondary | ICD-10-CM | POA: Diagnosis not present

## 2018-06-28 DIAGNOSIS — I5040 Unspecified combined systolic (congestive) and diastolic (congestive) heart failure: Secondary | ICD-10-CM | POA: Diagnosis not present

## 2018-06-28 DIAGNOSIS — I25119 Atherosclerotic heart disease of native coronary artery with unspecified angina pectoris: Secondary | ICD-10-CM | POA: Diagnosis not present

## 2018-06-28 DIAGNOSIS — I270 Primary pulmonary hypertension: Secondary | ICD-10-CM | POA: Diagnosis not present

## 2018-06-28 DIAGNOSIS — J9620 Acute and chronic respiratory failure, unspecified whether with hypoxia or hypercapnia: Secondary | ICD-10-CM | POA: Diagnosis not present

## 2018-06-28 DIAGNOSIS — K7460 Unspecified cirrhosis of liver: Secondary | ICD-10-CM | POA: Diagnosis not present

## 2018-06-29 DIAGNOSIS — I5040 Unspecified combined systolic (congestive) and diastolic (congestive) heart failure: Secondary | ICD-10-CM | POA: Diagnosis not present

## 2018-06-29 DIAGNOSIS — K7460 Unspecified cirrhosis of liver: Secondary | ICD-10-CM | POA: Diagnosis not present

## 2018-06-29 DIAGNOSIS — I270 Primary pulmonary hypertension: Secondary | ICD-10-CM | POA: Diagnosis not present

## 2018-06-29 DIAGNOSIS — J449 Chronic obstructive pulmonary disease, unspecified: Secondary | ICD-10-CM | POA: Diagnosis not present

## 2018-06-29 DIAGNOSIS — J9620 Acute and chronic respiratory failure, unspecified whether with hypoxia or hypercapnia: Secondary | ICD-10-CM | POA: Diagnosis not present

## 2018-06-29 DIAGNOSIS — I25119 Atherosclerotic heart disease of native coronary artery with unspecified angina pectoris: Secondary | ICD-10-CM | POA: Diagnosis not present

## 2018-07-02 DIAGNOSIS — K7460 Unspecified cirrhosis of liver: Secondary | ICD-10-CM | POA: Diagnosis not present

## 2018-07-02 DIAGNOSIS — J449 Chronic obstructive pulmonary disease, unspecified: Secondary | ICD-10-CM | POA: Diagnosis not present

## 2018-07-02 DIAGNOSIS — I25119 Atherosclerotic heart disease of native coronary artery with unspecified angina pectoris: Secondary | ICD-10-CM | POA: Diagnosis not present

## 2018-07-02 DIAGNOSIS — J9620 Acute and chronic respiratory failure, unspecified whether with hypoxia or hypercapnia: Secondary | ICD-10-CM | POA: Diagnosis not present

## 2018-07-02 DIAGNOSIS — I5040 Unspecified combined systolic (congestive) and diastolic (congestive) heart failure: Secondary | ICD-10-CM | POA: Diagnosis not present

## 2018-07-02 DIAGNOSIS — I270 Primary pulmonary hypertension: Secondary | ICD-10-CM | POA: Diagnosis not present

## 2018-07-03 DIAGNOSIS — K7460 Unspecified cirrhosis of liver: Secondary | ICD-10-CM | POA: Diagnosis not present

## 2018-07-03 DIAGNOSIS — J449 Chronic obstructive pulmonary disease, unspecified: Secondary | ICD-10-CM | POA: Diagnosis not present

## 2018-07-03 DIAGNOSIS — J9620 Acute and chronic respiratory failure, unspecified whether with hypoxia or hypercapnia: Secondary | ICD-10-CM | POA: Diagnosis not present

## 2018-07-03 DIAGNOSIS — I5040 Unspecified combined systolic (congestive) and diastolic (congestive) heart failure: Secondary | ICD-10-CM | POA: Diagnosis not present

## 2018-07-03 DIAGNOSIS — I270 Primary pulmonary hypertension: Secondary | ICD-10-CM | POA: Diagnosis not present

## 2018-07-03 DIAGNOSIS — I25119 Atherosclerotic heart disease of native coronary artery with unspecified angina pectoris: Secondary | ICD-10-CM | POA: Diagnosis not present

## 2018-07-05 DIAGNOSIS — K7460 Unspecified cirrhosis of liver: Secondary | ICD-10-CM | POA: Diagnosis not present

## 2018-07-05 DIAGNOSIS — J449 Chronic obstructive pulmonary disease, unspecified: Secondary | ICD-10-CM | POA: Diagnosis not present

## 2018-07-05 DIAGNOSIS — I5040 Unspecified combined systolic (congestive) and diastolic (congestive) heart failure: Secondary | ICD-10-CM | POA: Diagnosis not present

## 2018-07-05 DIAGNOSIS — J9620 Acute and chronic respiratory failure, unspecified whether with hypoxia or hypercapnia: Secondary | ICD-10-CM | POA: Diagnosis not present

## 2018-07-05 DIAGNOSIS — I25119 Atherosclerotic heart disease of native coronary artery with unspecified angina pectoris: Secondary | ICD-10-CM | POA: Diagnosis not present

## 2018-07-05 DIAGNOSIS — I270 Primary pulmonary hypertension: Secondary | ICD-10-CM | POA: Diagnosis not present

## 2018-07-06 DIAGNOSIS — I270 Primary pulmonary hypertension: Secondary | ICD-10-CM | POA: Diagnosis not present

## 2018-07-06 DIAGNOSIS — K7460 Unspecified cirrhosis of liver: Secondary | ICD-10-CM | POA: Diagnosis not present

## 2018-07-06 DIAGNOSIS — J449 Chronic obstructive pulmonary disease, unspecified: Secondary | ICD-10-CM | POA: Diagnosis not present

## 2018-07-06 DIAGNOSIS — I5040 Unspecified combined systolic (congestive) and diastolic (congestive) heart failure: Secondary | ICD-10-CM | POA: Diagnosis not present

## 2018-07-06 DIAGNOSIS — I25119 Atherosclerotic heart disease of native coronary artery with unspecified angina pectoris: Secondary | ICD-10-CM | POA: Diagnosis not present

## 2018-07-06 DIAGNOSIS — J9620 Acute and chronic respiratory failure, unspecified whether with hypoxia or hypercapnia: Secondary | ICD-10-CM | POA: Diagnosis not present

## 2018-07-10 DIAGNOSIS — E039 Hypothyroidism, unspecified: Secondary | ICD-10-CM | POA: Diagnosis not present

## 2018-07-10 DIAGNOSIS — K7460 Unspecified cirrhosis of liver: Secondary | ICD-10-CM | POA: Diagnosis not present

## 2018-07-10 DIAGNOSIS — F339 Major depressive disorder, recurrent, unspecified: Secondary | ICD-10-CM | POA: Diagnosis not present

## 2018-07-10 DIAGNOSIS — E785 Hyperlipidemia, unspecified: Secondary | ICD-10-CM | POA: Diagnosis not present

## 2018-07-10 DIAGNOSIS — J9620 Acute and chronic respiratory failure, unspecified whether with hypoxia or hypercapnia: Secondary | ICD-10-CM | POA: Diagnosis not present

## 2018-07-10 DIAGNOSIS — I10 Essential (primary) hypertension: Secondary | ICD-10-CM | POA: Diagnosis not present

## 2018-07-10 DIAGNOSIS — N390 Urinary tract infection, site not specified: Secondary | ICD-10-CM | POA: Diagnosis not present

## 2018-07-10 DIAGNOSIS — I25119 Atherosclerotic heart disease of native coronary artery with unspecified angina pectoris: Secondary | ICD-10-CM | POA: Diagnosis not present

## 2018-07-10 DIAGNOSIS — J449 Chronic obstructive pulmonary disease, unspecified: Secondary | ICD-10-CM | POA: Diagnosis not present

## 2018-07-10 DIAGNOSIS — I270 Primary pulmonary hypertension: Secondary | ICD-10-CM | POA: Diagnosis not present

## 2018-07-10 DIAGNOSIS — I5040 Unspecified combined systolic (congestive) and diastolic (congestive) heart failure: Secondary | ICD-10-CM | POA: Diagnosis not present

## 2018-07-10 DIAGNOSIS — N183 Chronic kidney disease, stage 3 (moderate): Secondary | ICD-10-CM | POA: Diagnosis not present

## 2018-07-11 DIAGNOSIS — I5040 Unspecified combined systolic (congestive) and diastolic (congestive) heart failure: Secondary | ICD-10-CM | POA: Diagnosis not present

## 2018-07-11 DIAGNOSIS — K7460 Unspecified cirrhosis of liver: Secondary | ICD-10-CM | POA: Diagnosis not present

## 2018-07-11 DIAGNOSIS — I25119 Atherosclerotic heart disease of native coronary artery with unspecified angina pectoris: Secondary | ICD-10-CM | POA: Diagnosis not present

## 2018-07-11 DIAGNOSIS — I270 Primary pulmonary hypertension: Secondary | ICD-10-CM | POA: Diagnosis not present

## 2018-07-11 DIAGNOSIS — J449 Chronic obstructive pulmonary disease, unspecified: Secondary | ICD-10-CM | POA: Diagnosis not present

## 2018-07-11 DIAGNOSIS — J9620 Acute and chronic respiratory failure, unspecified whether with hypoxia or hypercapnia: Secondary | ICD-10-CM | POA: Diagnosis not present

## 2018-07-12 DIAGNOSIS — I25119 Atherosclerotic heart disease of native coronary artery with unspecified angina pectoris: Secondary | ICD-10-CM | POA: Diagnosis not present

## 2018-07-12 DIAGNOSIS — I270 Primary pulmonary hypertension: Secondary | ICD-10-CM | POA: Diagnosis not present

## 2018-07-12 DIAGNOSIS — K7460 Unspecified cirrhosis of liver: Secondary | ICD-10-CM | POA: Diagnosis not present

## 2018-07-12 DIAGNOSIS — I5040 Unspecified combined systolic (congestive) and diastolic (congestive) heart failure: Secondary | ICD-10-CM | POA: Diagnosis not present

## 2018-07-12 DIAGNOSIS — J9620 Acute and chronic respiratory failure, unspecified whether with hypoxia or hypercapnia: Secondary | ICD-10-CM | POA: Diagnosis not present

## 2018-07-12 DIAGNOSIS — J449 Chronic obstructive pulmonary disease, unspecified: Secondary | ICD-10-CM | POA: Diagnosis not present

## 2018-07-14 DIAGNOSIS — I270 Primary pulmonary hypertension: Secondary | ICD-10-CM | POA: Diagnosis not present

## 2018-07-14 DIAGNOSIS — J449 Chronic obstructive pulmonary disease, unspecified: Secondary | ICD-10-CM | POA: Diagnosis not present

## 2018-07-14 DIAGNOSIS — J9620 Acute and chronic respiratory failure, unspecified whether with hypoxia or hypercapnia: Secondary | ICD-10-CM | POA: Diagnosis not present

## 2018-07-14 DIAGNOSIS — I25119 Atherosclerotic heart disease of native coronary artery with unspecified angina pectoris: Secondary | ICD-10-CM | POA: Diagnosis not present

## 2018-07-14 DIAGNOSIS — I5040 Unspecified combined systolic (congestive) and diastolic (congestive) heart failure: Secondary | ICD-10-CM | POA: Diagnosis not present

## 2018-07-14 DIAGNOSIS — K7460 Unspecified cirrhosis of liver: Secondary | ICD-10-CM | POA: Diagnosis not present

## 2018-07-15 DIAGNOSIS — I10 Essential (primary) hypertension: Secondary | ICD-10-CM | POA: Diagnosis not present

## 2018-07-15 DIAGNOSIS — R410 Disorientation, unspecified: Secondary | ICD-10-CM | POA: Diagnosis not present

## 2018-07-15 DIAGNOSIS — J439 Emphysema, unspecified: Secondary | ICD-10-CM | POA: Diagnosis not present

## 2018-07-16 DIAGNOSIS — I270 Primary pulmonary hypertension: Secondary | ICD-10-CM | POA: Diagnosis not present

## 2018-07-16 DIAGNOSIS — J9620 Acute and chronic respiratory failure, unspecified whether with hypoxia or hypercapnia: Secondary | ICD-10-CM | POA: Diagnosis not present

## 2018-07-16 DIAGNOSIS — I25119 Atherosclerotic heart disease of native coronary artery with unspecified angina pectoris: Secondary | ICD-10-CM | POA: Diagnosis not present

## 2018-07-16 DIAGNOSIS — I10 Essential (primary) hypertension: Secondary | ICD-10-CM | POA: Diagnosis not present

## 2018-07-16 DIAGNOSIS — I5040 Unspecified combined systolic (congestive) and diastolic (congestive) heart failure: Secondary | ICD-10-CM | POA: Diagnosis not present

## 2018-07-16 DIAGNOSIS — J449 Chronic obstructive pulmonary disease, unspecified: Secondary | ICD-10-CM | POA: Diagnosis not present

## 2018-07-16 DIAGNOSIS — E708 Other disorders of aromatic amino-acid metabolism: Secondary | ICD-10-CM | POA: Diagnosis not present

## 2018-07-16 DIAGNOSIS — K7460 Unspecified cirrhosis of liver: Secondary | ICD-10-CM | POA: Diagnosis not present

## 2018-07-17 DIAGNOSIS — D649 Anemia, unspecified: Secondary | ICD-10-CM | POA: Diagnosis not present

## 2018-07-17 DIAGNOSIS — I10 Essential (primary) hypertension: Secondary | ICD-10-CM | POA: Diagnosis not present

## 2018-07-18 DIAGNOSIS — J449 Chronic obstructive pulmonary disease, unspecified: Secondary | ICD-10-CM | POA: Diagnosis not present

## 2018-07-18 DIAGNOSIS — K7460 Unspecified cirrhosis of liver: Secondary | ICD-10-CM | POA: Diagnosis not present

## 2018-07-18 DIAGNOSIS — I5040 Unspecified combined systolic (congestive) and diastolic (congestive) heart failure: Secondary | ICD-10-CM | POA: Diagnosis not present

## 2018-07-18 DIAGNOSIS — I25119 Atherosclerotic heart disease of native coronary artery with unspecified angina pectoris: Secondary | ICD-10-CM | POA: Diagnosis not present

## 2018-07-18 DIAGNOSIS — I270 Primary pulmonary hypertension: Secondary | ICD-10-CM | POA: Diagnosis not present

## 2018-07-18 DIAGNOSIS — J9620 Acute and chronic respiratory failure, unspecified whether with hypoxia or hypercapnia: Secondary | ICD-10-CM | POA: Diagnosis not present

## 2018-07-20 DIAGNOSIS — I5040 Unspecified combined systolic (congestive) and diastolic (congestive) heart failure: Secondary | ICD-10-CM | POA: Diagnosis not present

## 2018-07-20 DIAGNOSIS — J9620 Acute and chronic respiratory failure, unspecified whether with hypoxia or hypercapnia: Secondary | ICD-10-CM | POA: Diagnosis not present

## 2018-07-20 DIAGNOSIS — I25119 Atherosclerotic heart disease of native coronary artery with unspecified angina pectoris: Secondary | ICD-10-CM | POA: Diagnosis not present

## 2018-07-20 DIAGNOSIS — K7460 Unspecified cirrhosis of liver: Secondary | ICD-10-CM | POA: Diagnosis not present

## 2018-07-20 DIAGNOSIS — J449 Chronic obstructive pulmonary disease, unspecified: Secondary | ICD-10-CM | POA: Diagnosis not present

## 2018-07-20 DIAGNOSIS — I270 Primary pulmonary hypertension: Secondary | ICD-10-CM | POA: Diagnosis not present

## 2018-07-22 DIAGNOSIS — J9620 Acute and chronic respiratory failure, unspecified whether with hypoxia or hypercapnia: Secondary | ICD-10-CM | POA: Diagnosis not present

## 2018-07-22 DIAGNOSIS — K7460 Unspecified cirrhosis of liver: Secondary | ICD-10-CM | POA: Diagnosis not present

## 2018-07-22 DIAGNOSIS — J449 Chronic obstructive pulmonary disease, unspecified: Secondary | ICD-10-CM | POA: Diagnosis not present

## 2018-07-22 DIAGNOSIS — I5040 Unspecified combined systolic (congestive) and diastolic (congestive) heart failure: Secondary | ICD-10-CM | POA: Diagnosis not present

## 2018-07-22 DIAGNOSIS — I270 Primary pulmonary hypertension: Secondary | ICD-10-CM | POA: Diagnosis not present

## 2018-07-22 DIAGNOSIS — I25119 Atherosclerotic heart disease of native coronary artery with unspecified angina pectoris: Secondary | ICD-10-CM | POA: Diagnosis not present

## 2018-07-24 DIAGNOSIS — I270 Primary pulmonary hypertension: Secondary | ICD-10-CM | POA: Diagnosis not present

## 2018-07-24 DIAGNOSIS — J9620 Acute and chronic respiratory failure, unspecified whether with hypoxia or hypercapnia: Secondary | ICD-10-CM | POA: Diagnosis not present

## 2018-07-24 DIAGNOSIS — J449 Chronic obstructive pulmonary disease, unspecified: Secondary | ICD-10-CM | POA: Diagnosis not present

## 2018-07-24 DIAGNOSIS — I25119 Atherosclerotic heart disease of native coronary artery with unspecified angina pectoris: Secondary | ICD-10-CM | POA: Diagnosis not present

## 2018-07-24 DIAGNOSIS — I5040 Unspecified combined systolic (congestive) and diastolic (congestive) heart failure: Secondary | ICD-10-CM | POA: Diagnosis not present

## 2018-07-24 DIAGNOSIS — K7460 Unspecified cirrhosis of liver: Secondary | ICD-10-CM | POA: Diagnosis not present

## 2018-07-25 DIAGNOSIS — J449 Chronic obstructive pulmonary disease, unspecified: Secondary | ICD-10-CM | POA: Diagnosis not present

## 2018-07-25 DIAGNOSIS — I25119 Atherosclerotic heart disease of native coronary artery with unspecified angina pectoris: Secondary | ICD-10-CM | POA: Diagnosis not present

## 2018-07-25 DIAGNOSIS — K7460 Unspecified cirrhosis of liver: Secondary | ICD-10-CM | POA: Diagnosis not present

## 2018-07-25 DIAGNOSIS — I5040 Unspecified combined systolic (congestive) and diastolic (congestive) heart failure: Secondary | ICD-10-CM | POA: Diagnosis not present

## 2018-07-25 DIAGNOSIS — J9620 Acute and chronic respiratory failure, unspecified whether with hypoxia or hypercapnia: Secondary | ICD-10-CM | POA: Diagnosis not present

## 2018-07-25 DIAGNOSIS — I270 Primary pulmonary hypertension: Secondary | ICD-10-CM | POA: Diagnosis not present

## 2018-08-10 DEATH — deceased

## 2019-01-25 ENCOUNTER — Other Ambulatory Visit: Payer: Self-pay

## 2019-01-25 NOTE — Telephone Encounter (Signed)
Called patient from recall list.  No answer. LMOV.  Need to schedule an Evisit.
